# Patient Record
Sex: Male | Born: 1955 | Race: White | Hispanic: No | Marital: Married | Smoking: Never smoker
Health system: Southern US, Community
[De-identification: ages and names within clinical notes are randomized; demographics above are authoritative.]

## PROBLEM LIST (undated history)

## (undated) DIAGNOSIS — K4090 Unilateral inguinal hernia, without obstruction or gangrene, not specified as recurrent: Secondary | ICD-10-CM

## (undated) DIAGNOSIS — I499 Cardiac arrhythmia, unspecified: Secondary | ICD-10-CM

## (undated) DIAGNOSIS — J189 Pneumonia, unspecified organism: Secondary | ICD-10-CM

## (undated) DIAGNOSIS — N400 Enlarged prostate without lower urinary tract symptoms: Secondary | ICD-10-CM

## (undated) DIAGNOSIS — Z8719 Personal history of other diseases of the digestive system: Secondary | ICD-10-CM

## (undated) DIAGNOSIS — K219 Gastro-esophageal reflux disease without esophagitis: Secondary | ICD-10-CM

## (undated) DIAGNOSIS — E0500 Thyrotoxicosis with diffuse goiter without thyrotoxic crisis or storm: Secondary | ICD-10-CM

## (undated) DIAGNOSIS — I10 Essential (primary) hypertension: Secondary | ICD-10-CM

## (undated) DIAGNOSIS — K760 Fatty (change of) liver, not elsewhere classified: Secondary | ICD-10-CM

## (undated) DIAGNOSIS — R3911 Hesitancy of micturition: Secondary | ICD-10-CM

## (undated) DIAGNOSIS — S5401XA Injury of ulnar nerve at forearm level, right arm, initial encounter: Secondary | ICD-10-CM

## (undated) DIAGNOSIS — M179 Osteoarthritis of knee, unspecified: Secondary | ICD-10-CM

## (undated) DIAGNOSIS — S52009A Unspecified fracture of upper end of unspecified ulna, initial encounter for closed fracture: Secondary | ICD-10-CM

## (undated) DIAGNOSIS — M1710 Unilateral primary osteoarthritis, unspecified knee: Secondary | ICD-10-CM

## (undated) DIAGNOSIS — E039 Hypothyroidism, unspecified: Secondary | ICD-10-CM

## (undated) DIAGNOSIS — N401 Enlarged prostate with lower urinary tract symptoms: Secondary | ICD-10-CM

## (undated) DIAGNOSIS — Z9289 Personal history of other medical treatment: Secondary | ICD-10-CM

## (undated) DIAGNOSIS — K7460 Unspecified cirrhosis of liver: Secondary | ICD-10-CM

## (undated) DIAGNOSIS — I4891 Unspecified atrial fibrillation: Secondary | ICD-10-CM

## (undated) DIAGNOSIS — G709 Myoneural disorder, unspecified: Secondary | ICD-10-CM

## (undated) DIAGNOSIS — K759 Inflammatory liver disease, unspecified: Secondary | ICD-10-CM

## (undated) HISTORY — PX: ELBOW FRACTURE SURGERY: SHX616

---

## 1977-02-12 HISTORY — PX: COLOSTOMY: SHX63

## 1977-02-12 HISTORY — PX: COLOSTOMY CLOSURE: SHX1381

## 1979-02-13 HISTORY — PX: APPENDECTOMY: SHX54

## 1996-02-13 HISTORY — PX: HERNIA REPAIR: SHX51

## 2003-02-13 HISTORY — PX: HIATAL HERNIA REPAIR: SHX195

## 2015-04-08 ENCOUNTER — Other Ambulatory Visit: Payer: Self-pay | Admitting: Neurosurgery

## 2015-04-08 ENCOUNTER — Encounter (HOSPITAL_COMMUNITY): Payer: Self-pay | Admitting: *Deleted

## 2015-04-08 DIAGNOSIS — M4712 Other spondylosis with myelopathy, cervical region: Secondary | ICD-10-CM | POA: Diagnosis not present

## 2015-04-08 DIAGNOSIS — G959 Disease of spinal cord, unspecified: Secondary | ICD-10-CM | POA: Diagnosis not present

## 2015-04-08 DIAGNOSIS — M1990 Unspecified osteoarthritis, unspecified site: Secondary | ICD-10-CM | POA: Diagnosis not present

## 2015-04-08 DIAGNOSIS — Z23 Encounter for immunization: Secondary | ICD-10-CM | POA: Diagnosis not present

## 2015-04-08 DIAGNOSIS — M4806 Spinal stenosis, lumbar region: Secondary | ICD-10-CM | POA: Diagnosis not present

## 2015-04-08 DIAGNOSIS — M50021 Cervical disc disorder at C4-C5 level with myelopathy: Secondary | ICD-10-CM | POA: Diagnosis present

## 2015-04-08 DIAGNOSIS — M4802 Spinal stenosis, cervical region: Secondary | ICD-10-CM | POA: Diagnosis not present

## 2015-04-08 DIAGNOSIS — N400 Enlarged prostate without lower urinary tract symptoms: Secondary | ICD-10-CM | POA: Diagnosis not present

## 2015-04-08 NOTE — Progress Notes (Signed)
[name] does not have a PCP. Patient states he has seen a urologist for BPH and has 2 prescriptions for that.  Patient denies chest ain or shortness of breath.  [name] does not know the doseages of his medications, he will bring a list will he comes for surgery.  I iinstructed patient to stop Diclofenac at this time.

## 2015-04-11 ENCOUNTER — Ambulatory Visit (HOSPITAL_COMMUNITY): Payer: Managed Care, Other (non HMO)

## 2015-04-11 ENCOUNTER — Ambulatory Visit (HOSPITAL_COMMUNITY): Payer: Managed Care, Other (non HMO) | Admitting: Certified Registered Nurse Anesthetist

## 2015-04-11 ENCOUNTER — Encounter (HOSPITAL_COMMUNITY): Payer: Self-pay | Admitting: *Deleted

## 2015-04-11 ENCOUNTER — Encounter (HOSPITAL_COMMUNITY)
Admission: RE | Disposition: A | Discharge status: Home / Self Care | Payer: Self-pay | Source: Ambulatory Visit | Attending: Neurosurgery

## 2015-04-11 ENCOUNTER — Observation Stay (HOSPITAL_COMMUNITY)
Admission: RE | Admit: 2015-04-11 | Discharge: 2015-04-14 | Disposition: A | Discharge status: Home / Self Care | Payer: Managed Care, Other (non HMO) | Source: Ambulatory Visit | Attending: Neurosurgery | Admitting: Neurosurgery

## 2015-04-11 DIAGNOSIS — M50021 Cervical disc disorder at C4-C5 level with myelopathy: Secondary | ICD-10-CM | POA: Diagnosis not present

## 2015-04-11 DIAGNOSIS — M4806 Spinal stenosis, lumbar region: Secondary | ICD-10-CM | POA: Insufficient documentation

## 2015-04-11 DIAGNOSIS — G959 Disease of spinal cord, unspecified: Secondary | ICD-10-CM | POA: Insufficient documentation

## 2015-04-11 DIAGNOSIS — N400 Enlarged prostate without lower urinary tract symptoms: Secondary | ICD-10-CM | POA: Insufficient documentation

## 2015-04-11 DIAGNOSIS — M1990 Unspecified osteoarthritis, unspecified site: Secondary | ICD-10-CM | POA: Insufficient documentation

## 2015-04-11 DIAGNOSIS — M4712 Other spondylosis with myelopathy, cervical region: Secondary | ICD-10-CM | POA: Diagnosis present

## 2015-04-11 DIAGNOSIS — M542 Cervicalgia: Secondary | ICD-10-CM

## 2015-04-11 DIAGNOSIS — Z23 Encounter for immunization: Secondary | ICD-10-CM | POA: Insufficient documentation

## 2015-04-11 DIAGNOSIS — M4802 Spinal stenosis, cervical region: Secondary | ICD-10-CM | POA: Insufficient documentation

## 2015-04-11 HISTORY — DX: Unilateral primary osteoarthritis, unspecified knee: M17.10

## 2015-04-11 HISTORY — DX: Benign prostatic hyperplasia without lower urinary tract symptoms: N40.0

## 2015-04-11 HISTORY — DX: Pneumonia, unspecified organism: J18.9

## 2015-04-11 HISTORY — DX: Unilateral inguinal hernia, without obstruction or gangrene, not specified as recurrent: K40.90

## 2015-04-11 HISTORY — DX: Osteoarthritis of knee, unspecified: M17.9

## 2015-04-11 HISTORY — DX: Hesitancy of micturition: R39.11

## 2015-04-11 HISTORY — DX: Unspecified fracture of upper end of unspecified ulna, initial encounter for closed fracture: S52.009A

## 2015-04-11 HISTORY — DX: Benign prostatic hyperplasia with lower urinary tract symptoms: N40.1

## 2015-04-11 HISTORY — PX: ANTERIOR CERVICAL DECOMP/DISCECTOMY FUSION: SHX1161

## 2015-04-11 HISTORY — DX: Injury of ulnar nerve at forearm level, right arm, initial encounter: S54.01XA

## 2015-04-11 LAB — SURGICAL PCR SCREEN
MRSA, PCR: POSITIVE — AB
STAPHYLOCOCCUS AUREUS: POSITIVE — AB

## 2015-04-11 LAB — CBC
HCT: 41.6 % (ref 39.0–52.0)
HEMOGLOBIN: 14.2 g/dL (ref 13.0–17.0)
MCH: 30.1 pg (ref 26.0–34.0)
MCHC: 34.1 g/dL (ref 30.0–36.0)
MCV: 88.1 fL (ref 78.0–100.0)
PLATELETS: 218 10*3/uL (ref 150–400)
RBC: 4.72 MIL/uL (ref 4.22–5.81)
RDW: 12.8 % (ref 11.5–15.5)
WBC: 7.2 10*3/uL (ref 4.0–10.5)

## 2015-04-11 LAB — BASIC METABOLIC PANEL
ANION GAP: 11 (ref 5–15)
BUN: 12 mg/dL (ref 6–20)
CALCIUM: 9.4 mg/dL (ref 8.9–10.3)
CO2: 23 mmol/L (ref 22–32)
CREATININE: 0.68 mg/dL (ref 0.61–1.24)
Chloride: 107 mmol/L (ref 101–111)
Glucose, Bld: 94 mg/dL (ref 65–99)
Potassium: 4.1 mmol/L (ref 3.5–5.1)
SODIUM: 141 mmol/L (ref 135–145)

## 2015-04-11 SURGERY — ANTERIOR CERVICAL DECOMPRESSION/DISCECTOMY FUSION 2 LEVELS
Anesthesia: General

## 2015-04-11 MED ORDER — ONDANSETRON HCL 4 MG/2ML IJ SOLN
INTRAMUSCULAR | Status: DC | PRN
  Administered 2015-04-11: 4 mg via INTRAVENOUS

## 2015-04-11 MED ORDER — BACITRACIN ZINC 500 UNIT/GM EX OINT
TOPICAL_OINTMENT | CUTANEOUS | Status: DC | PRN
  Administered 2015-04-11: 1 via TOPICAL

## 2015-04-11 MED ORDER — ONDANSETRON HCL 4 MG/2ML IJ SOLN
INTRAMUSCULAR | Status: AC
  Filled 2015-04-11: qty 2

## 2015-04-11 MED ORDER — LACTATED RINGERS IV SOLN
INTRAVENOUS | Status: DC
  Administered 2015-04-11: 17:00:00 via INTRAVENOUS

## 2015-04-11 MED ORDER — GLYCOPYRROLATE 0.2 MG/ML IJ SOLN
INTRAMUSCULAR | Status: DC | PRN
  Administered 2015-04-11: 0.6 mg via INTRAVENOUS

## 2015-04-11 MED ORDER — ROCURONIUM BROMIDE 50 MG/5ML IV SOLN
INTRAVENOUS | Status: AC
  Filled 2015-04-11: qty 1

## 2015-04-11 MED ORDER — DEXAMETHASONE 4 MG PO TABS
4.0000 mg | ORAL_TABLET | Freq: Four times a day (QID) | ORAL | Status: AC
  Administered 2015-04-11 – 2015-04-12 (×2): 4 mg via ORAL
  Filled 2015-04-11 (×2): qty 1

## 2015-04-11 MED ORDER — LIDOCAINE HCL (CARDIAC) 20 MG/ML IV SOLN
INTRAVENOUS | Status: AC
  Filled 2015-04-11: qty 10

## 2015-04-11 MED ORDER — NEOSTIGMINE METHYLSULFATE 10 MG/10ML IV SOLN
INTRAVENOUS | Status: AC
  Filled 2015-04-11: qty 1

## 2015-04-11 MED ORDER — ALUM & MAG HYDROXIDE-SIMETH 200-200-20 MG/5ML PO SUSP
30.0000 mL | Freq: Four times a day (QID) | ORAL | Status: DC | PRN
  Administered 2015-04-12 (×2): 30 mL via ORAL
  Filled 2015-04-11 (×2): qty 30

## 2015-04-11 MED ORDER — TAMSULOSIN HCL 0.4 MG PO CAPS
0.4000 mg | ORAL_CAPSULE | Freq: Two times a day (BID) | ORAL | Status: DC
  Administered 2015-04-11 – 2015-04-14 (×6): 0.4 mg via ORAL
  Filled 2015-04-11 (×6): qty 1

## 2015-04-11 MED ORDER — MIDAZOLAM HCL 5 MG/5ML IJ SOLN: INTRAMUSCULAR | Status: DC | PRN

## 2015-04-11 MED ORDER — CYCLOBENZAPRINE HCL 10 MG PO TABS
10.0000 mg | ORAL_TABLET | Freq: Three times a day (TID) | ORAL | Status: DC | PRN
Start: 2015-04-11 — End: 2015-04-14
  Administered 2015-04-13: 10 mg via ORAL
  Filled 2015-04-11: qty 1

## 2015-04-11 MED ORDER — SODIUM CHLORIDE 0.9 % IR SOLN
Status: DC | PRN
  Administered 2015-04-11: 500 mL

## 2015-04-11 MED ORDER — LACTATED RINGERS IV SOLN
INTRAVENOUS | Status: DC
  Administered 2015-04-11 (×3): via INTRAVENOUS

## 2015-04-11 MED ORDER — EPHEDRINE SULFATE 50 MG/ML IJ SOLN
INTRAMUSCULAR | Status: DC | PRN
  Administered 2015-04-11 (×2): 10 mg via INTRAVENOUS

## 2015-04-11 MED ORDER — DOCUSATE SODIUM 100 MG PO CAPS
100.0000 mg | ORAL_CAPSULE | Freq: Two times a day (BID) | ORAL | Status: DC
  Administered 2015-04-11 – 2015-04-14 (×5): 100 mg via ORAL
  Filled 2015-04-11 (×6): qty 1

## 2015-04-11 MED ORDER — LIDOCAINE HCL (CARDIAC) 20 MG/ML IV SOLN
INTRAVENOUS | Status: AC
  Filled 2015-04-11: qty 5

## 2015-04-11 MED ORDER — FENTANYL CITRATE (PF) 100 MCG/2ML IJ SOLN
INTRAMUSCULAR | Status: AC
  Filled 2015-04-11: qty 2

## 2015-04-11 MED ORDER — SUCCINYLCHOLINE CHLORIDE 20 MG/ML IJ SOLN
INTRAMUSCULAR | Status: DC | PRN
  Administered 2015-04-11: 120 mg via INTRAVENOUS

## 2015-04-11 MED ORDER — BUPIVACAINE-EPINEPHRINE (PF) 0.5% -1:200000 IJ SOLN
INTRAMUSCULAR | Status: DC | PRN
  Administered 2015-04-11: 10 mL via PERINEURAL

## 2015-04-11 MED ORDER — DEXAMETHASONE SODIUM PHOSPHATE 4 MG/ML IJ SOLN
4.0000 mg | Freq: Four times a day (QID) | INTRAMUSCULAR | Status: AC
  Administered 2015-04-11: 4 mg via INTRAVENOUS
  Filled 2015-04-11: qty 1

## 2015-04-11 MED ORDER — ACETAMINOPHEN 325 MG PO TABS: 650.0000 mg | ORAL_TABLET | ORAL | Status: DC | PRN

## 2015-04-11 MED ORDER — ARTIFICIAL TEARS OP OINT
TOPICAL_OINTMENT | OPHTHALMIC | Status: DC | PRN
  Administered 2015-04-11: 1 via OPHTHALMIC

## 2015-04-11 MED ORDER — FENTANYL CITRATE (PF) 250 MCG/5ML IJ SOLN
INTRAMUSCULAR | Status: AC
  Filled 2015-04-11: qty 5

## 2015-04-11 MED ORDER — FINASTERIDE 5 MG PO TABS
5.0000 mg | ORAL_TABLET | Freq: Every day | ORAL | Status: DC
  Administered 2015-04-12 – 2015-04-14 (×3): 5 mg via ORAL
  Filled 2015-04-11 (×3): qty 1

## 2015-04-11 MED ORDER — MIDAZOLAM HCL 2 MG/2ML IJ SOLN
INTRAMUSCULAR | Status: AC
  Filled 2015-04-11: qty 2

## 2015-04-11 MED ORDER — INFLUENZA VAC SPLIT QUAD 0.5 ML IM SUSY
0.5000 mL | PREFILLED_SYRINGE | INTRAMUSCULAR | Status: AC
  Administered 2015-04-12: 0.5 mL via INTRAMUSCULAR
  Filled 2015-04-11: qty 0.5

## 2015-04-11 MED ORDER — MENTHOL 3 MG MT LOZG
1.0000 | LOZENGE | OROMUCOSAL | Status: DC | PRN
  Administered 2015-04-12: 3 mg via ORAL
  Filled 2015-04-11: qty 9

## 2015-04-11 MED ORDER — PHENOL 1.4 % MT LIQD: 1.0000 | OROMUCOSAL | Status: DC | PRN

## 2015-04-11 MED ORDER — LACTATED RINGERS IV SOLN: INTRAVENOUS | Status: DC

## 2015-04-11 MED ORDER — MORPHINE SULFATE (PF) 2 MG/ML IV SOLN
1.0000 mg | INTRAVENOUS | Status: DC | PRN
Start: 2015-04-11 — End: 2015-04-14

## 2015-04-11 MED ORDER — 0.9 % SODIUM CHLORIDE (POUR BTL) OPTIME
TOPICAL | Status: DC | PRN
  Administered 2015-04-11: 1000 mL

## 2015-04-11 MED ORDER — THROMBIN 5000 UNITS EX SOLR
CUTANEOUS | Status: DC | PRN
  Administered 2015-04-11 (×2): 5000 [IU] via TOPICAL

## 2015-04-11 MED ORDER — DIAZEPAM 5 MG PO TABS: 5.0000 mg | ORAL_TABLET | Freq: Four times a day (QID) | ORAL | Status: DC | PRN

## 2015-04-11 MED ORDER — PHENYLEPHRINE HCL 10 MG/ML IJ SOLN
INTRAMUSCULAR | Status: DC | PRN
  Administered 2015-04-11: 80 ug via INTRAVENOUS

## 2015-04-11 MED ORDER — ONDANSETRON HCL 4 MG/2ML IJ SOLN: 4.0000 mg | INTRAMUSCULAR | Status: DC | PRN

## 2015-04-11 MED ORDER — MUPIROCIN 2 % EX OINT
1.0000 "application " | TOPICAL_OINTMENT | Freq: Once | CUTANEOUS | Status: DC
  Filled 2015-04-11: qty 22

## 2015-04-11 MED ORDER — GLYCOPYRROLATE 0.2 MG/ML IJ SOLN
INTRAMUSCULAR | Status: AC
  Filled 2015-04-11: qty 3

## 2015-04-11 MED ORDER — MIDAZOLAM HCL 5 MG/5ML IJ SOLN
INTRAMUSCULAR | Status: DC | PRN
  Administered 2015-04-11: 2 mg via INTRAVENOUS

## 2015-04-11 MED ORDER — BISACODYL 10 MG RE SUPP: 10.0000 mg | Freq: Every day | RECTAL | Status: DC | PRN

## 2015-04-11 MED ORDER — NEOSTIGMINE METHYLSULFATE 10 MG/10ML IV SOLN
INTRAVENOUS | Status: DC | PRN
  Administered 2015-04-11: 4 mg via INTRAVENOUS

## 2015-04-11 MED ORDER — ACETAMINOPHEN 650 MG RE SUPP: 650.0000 mg | RECTAL | Status: DC | PRN

## 2015-04-11 MED ORDER — HEMOSTATIC AGENTS (NO CHARGE) OPTIME
TOPICAL | Status: DC | PRN
  Administered 2015-04-11: 1 via TOPICAL

## 2015-04-11 MED ORDER — PANTOPRAZOLE SODIUM 40 MG IV SOLR
40.0000 mg | Freq: Every day | INTRAVENOUS | Status: DC
  Administered 2015-04-11: 40 mg via INTRAVENOUS

## 2015-04-11 MED ORDER — OXYCODONE-ACETAMINOPHEN 5-325 MG PO TABS
1.0000 | ORAL_TABLET | ORAL | Status: DC | PRN
  Administered 2015-04-13: 1 via ORAL
  Administered 2015-04-13 – 2015-04-14 (×2): 2 via ORAL
  Filled 2015-04-11: qty 2
  Filled 2015-04-11: qty 1
  Filled 2015-04-11: qty 2

## 2015-04-11 MED ORDER — DEXTROSE 5 % IV SOLN
3.0000 g | INTRAVENOUS | Status: AC
  Administered 2015-04-11: 3 g via INTRAVENOUS
  Filled 2015-04-11: qty 3000

## 2015-04-11 MED ORDER — ADULT MULTIVITAMIN W/MINERALS CH
ORAL_TABLET | Freq: Every day | ORAL | Status: DC
  Administered 2015-04-12 – 2015-04-14 (×3): 1 via ORAL
  Filled 2015-04-11 (×3): qty 1

## 2015-04-11 MED ORDER — LIDOCAINE HCL (CARDIAC) 20 MG/ML IV SOLN
INTRAVENOUS | Status: DC | PRN
  Administered 2015-04-11: 50 mg via INTRAVENOUS
  Administered 2015-04-11: 100 mg via INTRAVENOUS
  Administered 2015-04-11: 50 mg via INTRAVENOUS

## 2015-04-11 MED ORDER — MEPERIDINE HCL 25 MG/ML IJ SOLN: 6.2500 mg | INTRAMUSCULAR | Status: DC | PRN

## 2015-04-11 MED ORDER — FENTANYL CITRATE (PF) 100 MCG/2ML IJ SOLN
25.0000 ug | INTRAMUSCULAR | Status: DC | PRN
  Administered 2015-04-11 (×2): 50 ug via INTRAVENOUS

## 2015-04-11 MED ORDER — PROMETHAZINE HCL 25 MG/ML IJ SOLN: 6.2500 mg | INTRAMUSCULAR | Status: DC | PRN

## 2015-04-11 MED ORDER — DEXAMETHASONE SODIUM PHOSPHATE 10 MG/ML IJ SOLN
INTRAMUSCULAR | Status: DC | PRN
  Administered 2015-04-11: 10 mg via INTRAVENOUS

## 2015-04-11 MED ORDER — CEFAZOLIN SODIUM-DEXTROSE 2-3 GM-% IV SOLR
2.0000 g | Freq: Three times a day (TID) | INTRAVENOUS | Status: AC
Start: 2015-04-11 — End: 2015-04-12
  Administered 2015-04-11 – 2015-04-12 (×2): 2 g via INTRAVENOUS
  Filled 2015-04-11 (×2): qty 50

## 2015-04-11 MED ORDER — PROPOFOL 10 MG/ML IV BOLUS
INTRAVENOUS | Status: AC
Start: 2015-04-11 — End: 2015-04-11
  Filled 2015-04-11: qty 20

## 2015-04-11 MED ORDER — PROPOFOL 10 MG/ML IV BOLUS
INTRAVENOUS | Status: DC | PRN
  Administered 2015-04-11: 200 mg via INTRAVENOUS
  Administered 2015-04-11: 30 mg via INTRAVENOUS
  Administered 2015-04-11 (×2): 20 mg via INTRAVENOUS

## 2015-04-11 MED ORDER — LIDOCAINE HCL (CARDIAC) 20 MG/ML IV SOLN: INTRAVENOUS | Status: DC | PRN

## 2015-04-11 MED ORDER — FENTANYL CITRATE (PF) 100 MCG/2ML IJ SOLN
INTRAMUSCULAR | Status: DC | PRN
  Administered 2015-04-11 (×5): 50 ug via INTRAVENOUS

## 2015-04-11 MED ORDER — HYDROCODONE-ACETAMINOPHEN 5-325 MG PO TABS
1.0000 | ORAL_TABLET | ORAL | Status: DC | PRN
Start: 2015-04-11 — End: 2015-04-14

## 2015-04-11 MED ORDER — ARTIFICIAL TEARS OP OINT
TOPICAL_OINTMENT | OPHTHALMIC | Status: AC
  Filled 2015-04-11: qty 3.5

## 2015-04-11 MED ORDER — ROCURONIUM BROMIDE 100 MG/10ML IV SOLN
INTRAVENOUS | Status: DC | PRN
  Administered 2015-04-11: 50 mg via INTRAVENOUS
  Administered 2015-04-11 (×2): 20 mg via INTRAVENOUS

## 2015-04-11 SURGICAL SUPPLY — 55 items
BAG DECANTER FOR FLEXI CONT (MISCELLANEOUS) ×2 IMPLANT
BENZOIN TINCTURE PRP APPL 2/3 (GAUZE/BANDAGES/DRESSINGS) ×2 IMPLANT
BIT DRILL NEURO 2X3.1 SFT TUCH (MISCELLANEOUS) ×1 IMPLANT
BLADE SURG 15 STRL LF DISP TIS (BLADE) ×1 IMPLANT
BLADE SURG 15 STRL SS (BLADE) ×1
BLADE ULTRA TIP 2M (BLADE) ×2 IMPLANT
BRUSH SCRUB EZ PLAIN DRY (MISCELLANEOUS) ×2 IMPLANT
BUR BARREL STRAIGHT FLUTE 4.0 (BURR) ×2 IMPLANT
BUR MATCHSTICK NEURO 3.0 LAGG (BURR) ×2 IMPLANT
CANISTER SUCT 3000ML PPV (MISCELLANEOUS) ×2 IMPLANT
COVER MAYO STAND STRL (DRAPES) ×2 IMPLANT
DRAPE LAPAROTOMY 100X72 PEDS (DRAPES) ×2 IMPLANT
DRAPE MICROSCOPE LEICA (MISCELLANEOUS) IMPLANT
DRAPE POUCH INSTRU U-SHP 10X18 (DRAPES) ×2 IMPLANT
DRAPE PROXIMA HALF (DRAPES) ×2 IMPLANT
DRAPE SURG 17X23 STRL (DRAPES) ×4 IMPLANT
DRILL NEURO 2X3.1 SOFT TOUCH (MISCELLANEOUS) ×2
ELECT REM PT RETURN 9FT ADLT (ELECTROSURGICAL) ×2
ELECTRODE REM PT RTRN 9FT ADLT (ELECTROSURGICAL) ×1 IMPLANT
GAUZE SPONGE 4X4 12PLY STRL (GAUZE/BANDAGES/DRESSINGS) ×2 IMPLANT
GAUZE SPONGE 4X4 16PLY XRAY LF (GAUZE/BANDAGES/DRESSINGS) IMPLANT
GLOVE BIO SURGEON STRL SZ8 (GLOVE) ×2 IMPLANT
GLOVE BIO SURGEON STRL SZ8.5 (GLOVE) ×2 IMPLANT
GLOVE EXAM NITRILE LRG STRL (GLOVE) IMPLANT
GLOVE EXAM NITRILE MD LF STRL (GLOVE) IMPLANT
GLOVE EXAM NITRILE XL STR (GLOVE) IMPLANT
GLOVE EXAM NITRILE XS STR PU (GLOVE) IMPLANT
GOWN STRL REUS W/ TWL LRG LVL3 (GOWN DISPOSABLE) IMPLANT
GOWN STRL REUS W/ TWL XL LVL3 (GOWN DISPOSABLE) IMPLANT
GOWN STRL REUS W/TWL LRG LVL3 (GOWN DISPOSABLE)
GOWN STRL REUS W/TWL XL LVL3 (GOWN DISPOSABLE)
KIT BASIN OR (CUSTOM PROCEDURE TRAY) ×2 IMPLANT
KIT ROOM TURNOVER OR (KITS) ×2 IMPLANT
MARKER SKIN DUAL TIP RULER LAB (MISCELLANEOUS) ×2 IMPLANT
NEEDLE HYPO 22GX1.5 SAFETY (NEEDLE) ×2 IMPLANT
NEEDLE SPNL 18GX3.5 QUINCKE PK (NEEDLE) ×2 IMPLANT
NS IRRIG 1000ML POUR BTL (IV SOLUTION) ×2 IMPLANT
PACK LAMINECTOMY NEURO (CUSTOM PROCEDURE TRAY) ×2 IMPLANT
PEEK VISTA 14X14X7MM (Peek) ×2 IMPLANT
PIN DISTRACTION 14MM (PIN) ×4 IMPLANT
PLATE ANT CERV XTEND 2 LV 28 (Plate) ×2 IMPLANT
PUTTY KINEX BIOACTIVE 5CC (Bone Implant) ×2 IMPLANT
RUBBERBAND STERILE (MISCELLANEOUS) IMPLANT
SCREW XTD VAR 4.2 SELF TAP (Screw) ×12 IMPLANT
SPONGE INTESTINAL PEANUT (DISPOSABLE) ×4 IMPLANT
SPONGE SURGIFOAM ABS GEL SZ50 (HEMOSTASIS) ×2 IMPLANT
STRIP CLOSURE SKIN 1/2X4 (GAUZE/BANDAGES/DRESSINGS) ×2 IMPLANT
SUT VIC AB 0 CT1 27 (SUTURE) ×1
SUT VIC AB 0 CT1 27XBRD ANTBC (SUTURE) ×1 IMPLANT
SUT VIC AB 3-0 SH 8-18 (SUTURE) ×2 IMPLANT
TAPE CLOTH SURG 4X10 WHT LF (GAUZE/BANDAGES/DRESSINGS) ×2 IMPLANT
TOWEL OR 17X24 6PK STRL BLUE (TOWEL DISPOSABLE) ×2 IMPLANT
TOWEL OR 17X26 10 PK STRL BLUE (TOWEL DISPOSABLE) ×2 IMPLANT
VISTA S 14X14X7 (Spacer) ×2 IMPLANT
WATER STERILE IRR 1000ML POUR (IV SOLUTION) ×2 IMPLANT

## 2015-04-11 NOTE — Anesthesia Procedure Notes (Signed)
Procedure Name: Intubation Date/Time: 04/11/2015 10:40 AM Performed by: Scheryl Darter Pre-anesthesia Checklist: Patient identified, Emergency Drugs available, Suction available, Patient being monitored and Timeout performed Patient Re-evaluated:Patient Re-evaluated prior to inductionOxygen Delivery Method: Circle system utilized Preoxygenation: Pre-oxygenation with 100% oxygen Intubation Type: IV induction Ventilation: Mask ventilation without difficulty and Oral airway inserted - appropriate to patient size Laryngoscope Size: Glidescope Grade View: Grade II Tube size: 7.5 mm Number of attempts: 1 Airway Equipment and Method: Stylet Placement Confirmation: ETT inserted through vocal cords under direct vision,  positive ETCO2 and breath sounds checked- equal and bilateral Secured at: 23 cm Tube secured with: Tape Dental Injury: Teeth and Oropharynx as per pre-operative assessment  Difficulty Due To: Difficulty was anticipated Comments: Head and neck held by Dr Arnoldo Morale during induction and intubation/no movement

## 2015-04-11 NOTE — Anesthesia Postprocedure Evaluation (Signed)
Anesthesia Post Note  Patient: Darren Harrison  Procedure(s) Performed: Procedure(s) (LRB): Cervical four - five Cervical five-six anterior cervical decompression with fusion interbody prosthesis plating and bonegraft (N/A)  Patient location during evaluation: PACU Anesthesia Type: General Level of consciousness: awake and alert Pain management: pain level controlled Vital Signs Assessment: post-procedure vital signs reviewed and stable Respiratory status: spontaneous breathing, nonlabored ventilation and respiratory function stable Cardiovascular status: blood pressure returned to baseline and stable Postop Assessment: no signs of nausea or vomiting Anesthetic complications: no    Last Vitals:  Filed Vitals:   04/11/15 1445 04/11/15 1500  BP: 131/90 132/82  Pulse: 89 74  Temp:    Resp: 18 18    Last Pain:  Filed Vitals:   04/11/15 1514  PainSc: Asleep                 Zackari Ruane A

## 2015-04-11 NOTE — Op Note (Signed)
Brief history: The patient is a 60 year old white male who has presented with a progressive difficulty with ambulation. 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 He was worked up with a cervical MRI which demonstrated significant narrowing at C4-5 and C5-6. I discussed the various treatment options with the patient including surgery. He has weighed the risks, benefits, and alternative surgery and decided proceed with C4-5 and C5-6 anterior cervical discectomy, fusion, and plating.  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 Earle Gell  Asst.: Dr. Granville Lewis  Anesthesia: Gen. endotracheal  Estimated blood loss: 100 mL  Drains: None  Complications: None  Description of procedure: The patient was brought to the operating room by the anesthesia team. General endotracheal anesthesia was induced. A roll was placed under the patient's shoulders to keep the neck in the neutral position. The patient's anterior cervical region was then prepared with Betadine scrub and Betadine solution. Sterile drapes were applied.  The area to be incised was then injected with Marcaine with epinephrine solution. I then used a scalpel to make a transverse incision in the patient's left anterior neck. I used the Metzenbaum scissors to divide the platysmal muscle and then to dissect medial to the sternocleidomastoid muscle, jugular vein, and carotid artery. I carefully dissected down towards the anterior cervical spine identifying the esophagus and retracting it medially. Then using Kitner swabs  to clear soft tissue from the anterior cervical spine. We then inserted a bent spinal needle into the upper exposed intervertebral disc space. We then obtained intraoperative radiographs confirm our location.  I then used electrocautery to detach the medial border of the longus colli muscle bilaterally from the C4-5 and C5-6 intervertebral disc spaces. I then inserted the Caspar self-retaining retractor underneath the longus colli muscle bilaterally to provide exposure.  We then incised the intervertebral disc at C4-5. We then performed a partial intervertebral discectomy with a pituitary forceps and the Karlin curettes. I then inserted distraction screws into the vertebral bodies at C4 and C5. We then distracted the interspace. We then used the high-speed drill to decorticate the vertebral endplates at [id number], to drill away the remainder of the intervertebral disc, to drill away some posterior spondylosis, and to thin out the posterior longitudinal ligament. I then incised ligament with the arachnoid knife. We then removed the ligament with a Kerrison punches undercutting the vertebral endplates and decompressing the thecal sac. We then performed foraminotomies about the bilateral C5 nerve roots. This completed the decompression at this level.  We then repeated this procedure and analogous fashion at C5-6 decompressing the thecal sac and the bilateral C6 nerve roots.  We now turned our to attention to the interbody fusion. We used the trial spacers to determine the appropriate size for the interbody prosthesis. We then pre-filled prosthesis with a combination of local morcellized autograft bone that we obtained during decompression as well as Kinnex bone graft extender. We then inserted the prosthesis into the distracted interspace at C4-5 and C5-6. We then removed the distraction screws. There was a good snug fit of the prosthesis in the interspace.  Having completed the fusion we now turned attention to  the anterior spinal instrumentation. We  used the high-speed drill to drill away some anterior spondylosis at the disc spaces so that the plate lay down flat. We selected the appropriate length titanium anterior cervical plate. We laid it along the anterior aspect of the vertebral bodies from C4-C6. We then drilled 14 mm holes at C4, C5 and C6. We then secured the plate to the vertebral bodies by placing two 14 mm self-tapping screws at C4, C5 and C6. We then obtained intraoperative radiograph. The demonstrating good position of the instrumentation. We therefore secured the screws the plate the locking each cam. This completed the instrumentation.  We then obtained hemostasis using bipolar electrocautery. We irrigated the wound out with bacitracin solution. We then removed the retractor. We inspected the esophagus for any damage. There was none apparent. We then reapproximated patient's platysmal muscle with interrupted 3-0 Vicryl suture. We then reapproximated the subcutaneous tissue with interrupted 3-0 Vicryl suture. The skin was reapproximated with Steri-Strips and benzoin. The wound was then covered with bacitracin ointment. A sterile dressing was applied. The drapes were removed. Patient was subsequently extubated by the anesthesia team and transported to the post anesthesia care unit in stable condition. All sponge instrument and needle counts were reportedly correct at the end of this case.

## 2015-04-11 NOTE — Anesthesia Preprocedure Evaluation (Signed)
Anesthesia Evaluation  Patient identified by MRN, date of birth, ID band Patient awake    Reviewed: Allergy & Precautions, NPO status , Patient's Chart, lab work & pertinent test results  Airway Mallampati: II  TM Distance: >3 FB Neck ROM: Full    Dental no notable dental hx.    Pulmonary neg pulmonary ROS,    Pulmonary exam normal breath sounds clear to auscultation       Cardiovascular negative cardio ROS Normal cardiovascular exam Rhythm:Regular Rate:Normal     Neuro/Psych negative neurological ROS  negative psych ROS   GI/Hepatic negative GI ROS, Neg liver ROS,   Endo/Other  negative endocrine ROS  Renal/GU negative Renal ROS  negative genitourinary   Musculoskeletal  (+) Arthritis ,   Abdominal   Peds negative pediatric ROS (+)  Hematology negative hematology ROS (+)   Anesthesia Other Findings   Reproductive/Obstetrics negative OB ROS                             Anesthesia Physical Anesthesia Plan  ASA: II  Anesthesia Plan: General   Post-op Pain Management:    Induction: Intravenous  Airway Management Planned: Oral ETT  Additional Equipment:   Intra-op Plan:   Post-operative Plan: Extubation in OR  Informed Consent: I have reviewed the patients History and Physical, chart, labs and discussed the procedure including the risks, benefits and alternatives for the proposed anesthesia with the patient or authorized representative who has indicated his/her understanding and acceptance.   Dental advisory given  Plan Discussed with: CRNA  Anesthesia Plan Comments:         Anesthesia Quick Evaluation

## 2015-04-11 NOTE — Transfer of Care (Signed)
Immediate Anesthesia Transfer of Care Note  Patient: Darren Harrison  Procedure(s) Performed: Procedure(s) with comments: Cervical four - five Cervical five-six anterior cervical decompression with fusion interbody prosthesis plating and bonegraft (N/A) - C45 C56 anterior cervical decompression with fusion interbody prosthesis plating and bonegraft  Patient Location: PACU  Anesthesia Type:General  Level of Consciousness: awake, alert , oriented and sedated  Airway & Oxygen Therapy: Patient Spontanous Breathing and Patient connected to face mask oxygen  Post-op Assessment: Report given to RN, Post -op Vital signs reviewed and stable and Patient moving all extremities  Post vital signs: Reviewed and stable  Last Vitals:  Filed Vitals:   04/11/15 0920 04/11/15 1307  BP: 136/82   Pulse: 72   Temp: 36.4 C 36.4 C  Resp: 16     Complications: No apparent anesthesia complications

## 2015-04-11 NOTE — Progress Notes (Signed)
Pt to main pacu to hold
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
## 2015-04-11 NOTE — Progress Notes (Signed)
Patient ID: Darren Harrison, male   DOB: June 09, 1955, 60 y.o.   MRN: [id number] Subjective:  The patient is alert and pleasant. He is in no apparent distress.  Objective: Vital signs in last 24 hours: Temp:  [97.5 F (36.4 C)-97.6 F (36.4 C)] 97.6 F (36.4 C) (02/27 1307) Pulse Rate:  [48-86] 86 (02/27 1325) Resp:  [16-27] 23 (02/27 1325) BP: (136-149)/(82-131) 149/97 mmHg (02/27 1325) SpO2:  [92 %-98 %] 92 % (02/27 1325) Weight:  [120.203 kg (265 lb)-122.471 kg (270 lb)] 120.203 kg (265 lb) (02/27 0920)  Intake/Output from previous day:   Intake/Output this shift: Total I/O In: 1350 [I.V.:1300; IV Piggyback:50] Out: 25 [Blood:25]  Physical exam the patient is alert and pleasant. He is moving all 4 extremities.  The patient's dressing is clean and dry. There is no hematoma or shift.  Lab Results:  Recent Labs  04/11/15 0841  WBC 7.2  HGB 14.2  HCT 41.6  PLT 218   BMET  Recent Labs  04/11/15 0841  NA 141  K 4.1  CL 107  CO2 23  GLUCOSE 94  BUN 12  CREATININE 0.68  CALCIUM 9.4    Studies/Results: Dg Cervical Spine 2-3 Views  04/11/2015  CLINICAL DATA:  ACDF C4-C6. EXAM: CERVICAL SPINE - 2-3 VIEW COMPARISON:  03/22/2015 cervical spine MRI. FINDINGS: Portable cross-table lateral intraoperative radiograph demonstrates anterior approach surgical marking device terminating in the anterior C3-4 disc space. Subsequent portable cross-table lateral intraoperative radiograph demonstrates anterior surgical plate with interlocking vertebral body screws from C4-C6. Surgical sponges overlie the prevertebral soft tissues at the C3 level and overlie the anterior neck at the C5 level. Oral route tube enters the upper airway. IMPRESSION: Anterior approach surgical marking device position as described, and subsequent postsurgical changes from C4-C6 ACDF. Electronically Signed   By: Ilona Sorrel M.D.   On: 04/11/2015 13:02    Assessment/Plan: The patient is doing well. I spoke with  his wife and children.      Akaya Proffit D 04/11/2015, 1:29 PM

## 2015-04-12 ENCOUNTER — Encounter (HOSPITAL_COMMUNITY): Payer: Self-pay | Admitting: Neurosurgery

## 2015-04-12 DIAGNOSIS — G8250 Quadriplegia, unspecified: Secondary | ICD-10-CM | POA: Diagnosis not present

## 2015-04-12 DIAGNOSIS — M50021 Cervical disc disorder at C4-C5 level with myelopathy: Secondary | ICD-10-CM | POA: Diagnosis not present

## 2015-04-12 DIAGNOSIS — M4712 Other spondylosis with myelopathy, cervical region: Secondary | ICD-10-CM | POA: Diagnosis not present

## 2015-04-12 MED ORDER — CHLORHEXIDINE GLUCONATE CLOTH 2 % EX PADS
6.0000 | MEDICATED_PAD | Freq: Every day | CUTANEOUS | Status: DC
  Administered 2015-04-12 – 2015-04-14 (×3): 6 via TOPICAL

## 2015-04-12 MED ORDER — PANTOPRAZOLE SODIUM 40 MG PO TBEC
40.0000 mg | DELAYED_RELEASE_TABLET | Freq: Every day | ORAL | Status: DC
  Administered 2015-04-12 – 2015-04-13 (×2): 40 mg via ORAL
  Filled 2015-04-12 (×2): qty 1

## 2015-04-12 MED ORDER — MUPIROCIN 2 % EX OINT
1.0000 "application " | TOPICAL_OINTMENT | Freq: Two times a day (BID) | CUTANEOUS | Status: DC
  Administered 2015-04-12 – 2015-04-14 (×5): 1 via NASAL
  Filled 2015-04-12 (×2): qty 22

## 2015-04-12 NOTE — Progress Notes (Signed)
Patient ID: Darren Harrison, male   DOB: October 07, 1955, 60 y.o.   MRN: [id number] Subjective:  The patient is alert and pleasant. He looks and feels much better than prior to surgery.  Objective: Vital signs in last 24 hours: Temp:  [97 F (36.1 C)-98.9 F (37.2 C)] 98.4 F (36.9 C) (02/28 0257) Pulse Rate:  [48-99] 63 (02/28 0257) Resp:  [16-27] 21 (02/28 0257) BP: (126-150)/(65-131) 126/95 mmHg (02/28 0257) SpO2:  [76 %-100 %] 100 % (02/28 0257) Weight:  [120.203 kg (265 lb)-125.5 kg (276 lb 10.8 oz)] 125.5 kg (276 lb 10.8 oz) (02/27 1600)  Intake/Output from previous day: 02/27 0701 - 02/28 0700 In: 2250 [I.V.:2200; IV Piggyback:50] Out: [id number] [Urine:2450; Blood:25] Intake/Output this shift:    Physical exam the patient is alert and pleasant. He is moving all 4 extremities well. His dressing is clean and dry. There is no hematoma or shift.  Lab Results:  Recent Labs  04/11/15 0841  WBC 7.2  HGB 14.2  HCT 41.6  PLT 218   BMET  Recent Labs  04/11/15 0841  NA 141  K 4.1  CL 107  CO2 23  GLUCOSE 94  BUN 12  CREATININE 0.68  CALCIUM 9.4    Studies/Results: Dg Cervical Spine 2-3 Views  04/11/2015  CLINICAL DATA:  ACDF C4-C6. EXAM: CERVICAL SPINE - 2-3 VIEW COMPARISON:  03/22/2015 cervical spine MRI. FINDINGS: Portable cross-table lateral intraoperative radiograph demonstrates anterior approach surgical marking device terminating in the anterior C3-4 disc space. Subsequent portable cross-table lateral intraoperative radiograph demonstrates anterior surgical plate with interlocking vertebral body screws from C4-C6. Surgical sponges overlie the prevertebral soft tissues at the C3 level and overlie the anterior neck at the C5 level. Oral route tube enters the upper airway. IMPRESSION: Anterior approach surgical marking device position as described, and subsequent postsurgical changes from C4-C6 ACDF. Electronically Signed   By: Ilona Sorrel M.D.   On: 04/11/2015 13:02     Assessment/Plan: Postoperative day #1: We will mobilize the patient with PT and OT. He may need rehabilitation since he was having difficulty with ambulation prior to surgery.      Amad Mau D 04/12/2015, 7:02 AM
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
## 2015-04-12 NOTE — Evaluation (Signed)
Occupational Therapy Evaluation Patient Details Name: Darren Harrison MRN: [id number] DOB: 10/31/1955 Today's Date: 04/12/2015    History of Present Illness Pt admitted for C4-5, C5-6 anterior cervical discectomy, fusion, and plating due to several months progressive weakness and difficulty with ambulation.   Pt also found to have significant lumbar spinal stenosis.   PMH:   PNA,    Clinical Impression   Pt admitted with above. He demonstrates the below listed deficits and will benefit from continued OT to maximize safety and independence with BADLs.  Pt presents to OT with bil. UE weakness, LE weakness, impaired balance, impaired sensation bil. Hands.  He requires mod - max A for ADLs at this time.  He is VERY motivated to regain independence. He was working full time 2-3 weeks ago and is eager to return to work.   Feel he would be an EXCELLENT candidate for CIR to allow him to regain independence with ADLs and return home with wife at supervision - min A level.        Follow Up Recommendations  CIR;Supervision/Assistance - 24 hour    Equipment Recommendations  3 in 1 bedside comode    Recommendations for Other Services Rehab consult     Precautions / Restrictions Precautions Precautions: Cervical;Fall Precaution Comments: Reviewed cervical precautions w/ pt Required Braces or Orthoses: Cervical Brace Cervical Brace: Hard collar;At all times Restrictions Weight Bearing Restrictions: No      Mobility Bed Mobility Overal bed mobility: Needs Assistance Bed Mobility: Supine to Sit     Supine to sit: Min guard     General bed mobility comments: Pt long sitting in bed upon PT arrival.  Pt requires increased time and cues to avoid twisting his neck when turning to sit EOB.  Transfers Overall transfer level: Needs assistance Equipment used: Rolling walker (2 wheeled) Transfers: Sit to/from Stand Sit to Stand: Min assist;+2 physical assistance Stand pivot transfers: Min  assist;+2 physical assistance       General transfer comment: Assist to boost up to standing while keeping RW steady.  Cues for hand placement.      Balance Overall balance assessment: Needs assistance Sitting-balance support: Feet supported Sitting balance-Leahy Scale: Fair     Standing balance support: Bilateral upper extremity supported Standing balance-Leahy Scale: Poor                              ADL Overall ADL's : Needs assistance/impaired Eating/Feeding: Sitting;Minimal assistance Eating/Feeding Details (indicate cue type and reason): Pt dropping items frequently.   Demonstrates difficulty targeting mouth, and difficulty cutting items.  He reports his performance is better today than it was yesterday.  Pt was provided with built up handled curved utensils, and was able to self feed with set up and less droppage   Grooming: Wash/dry hands;Wash/dry face;Oral care;Moderate assistance Grooming Details (indicate cue type and reason): washes face with min A, hands with set up, brushing teeth with min A and hair with mod A  Upper Body Bathing: Moderate assistance;Sitting   Lower Body Bathing: Maximal assistance;Sit to/from stand   Upper Body Dressing : Maximal assistance;Sitting   Lower Body Dressing: Total assistance;Sit to/from stand   Toilet Transfer: Minimal assistance;+2 for physical assistance;Stand-pivot;BSC   Toileting- Clothing Manipulation and Hygiene: Maximal assistance;Sit to/from stand       Functional mobility during ADLs: Minimal assistance;+2 for physical assistance;Moderate assistance General ADL Comments: Pt is unsteady on his feet, requires assist for balance.  He is highly motivated, but requires assist due to weakness and impaired balance      Vision     Perception     Praxis      Pertinent Vitals/Pain Pain Assessment: No/denies pain     Hand Dominance Right   Extremity/Trunk Assessment Upper Extremity Assessment Upper  Extremity Assessment: Defer to OT evaluation RUE Deficits / Details: Rt UE grossly 2/5 shoulder; 3+/5 elbow and hand   RUE Sensation: decreased light touch RUE Coordination: decreased gross motor;decreased fine motor LUE Deficits / Details: Pt with h/o Lt shoulder injury.  He tends to abduct Lt shoulder when attempting to elevate shoulder - he states this is his normal movement pattern.   elbow distally 3+/5 LUE Sensation: decreased light touch LUE Coordination: decreased fine motor;decreased gross motor   Lower Extremity Assessment Lower Extremity Assessment: RLE deficits/detail;LLE deficits/detail RLE Deficits / Details: 3/5 hip flexion and DF; 4/5 knee flexion and extension RLE Sensation: decreased light touch RLE Coordination: decreased gross motor LLE Deficits / Details: hip flexion 4/5 LLE Sensation: decreased light touch LLE Coordination: decreased gross motor       Communication Communication Communication: No difficulties   Cognition Arousal/Alertness: Awake/alert Behavior During Therapy: WFL for tasks assessed/performed Overall Cognitive Status: Within Functional Limits for tasks assessed                     General Comments       Exercises       Shoulder Instructions      Home Living Family/patient expects to be discharged to:: Private residence Living Arrangements: Spouse/significant other Available Help at Discharge: Family;Available PRN/intermittently Type of Home: House Home Access: Level entry     Home Layout: Two level;Able to live on main level with bedroom/bathroom Alternate Level Stairs-Number of Steps: full flight    Bathroom Shower/Tub: Occupational psychologist: Standard Bathroom Accessibility: Yes How Accessible: Accessible via walker Home Equipment: Walker - standard;Shower seat;Grab bars - toilet;Wheelchair - Education officer, museum Comments: wife works during the day, comes home at lunch and is only 15 mins  away if pt needs assist       Prior Functioning/Environment Level of Independence: Needs assistance  Gait / Transfers Assistance Needed: had only been performing stand pivot transfers to w/c/chair for 2-3 wks PTA, with wife assisting  ADL's / Homemaking Assistance Needed: Pt reports he had stopped working, was unable to drive 2 wks PTA, and was requiring assist with bathing, dressing, toileting as well as with functional transfers  Communication / Swallowing Assistance Needed: Pt works Information systems manager  for [employer]       OT Diagnosis: Generalized weakness;Acute pain;Paresis   OT Problem List: Decreased strength;Decreased range of motion;Impaired balance (sitting and/or standing);Decreased coordination;Decreased knowledge of use of DME or AE;Decreased knowledge of precautions;Impaired UE functional use;Pain   OT Treatment/Interventions: Self-care/ADL training;Therapeutic exercise;Neuromuscular education;DME and/or AE instruction;Therapeutic activities;Patient/family education;Balance training    OT Goals(Current goals can be found in the care plan section) Acute Rehab OT Goals Patient Stated Goal: To go back to work ASAP, and to walk his daughters down the aisle (getting married in April and July) OT Goal Formulation: With patient Time For Goal Achievement: 04/26/15 Potential to Achieve Goals: Good ADL Goals Pt Will Perform Eating: with modified independence;with adaptive utensils Pt Will Perform Grooming: with min guard assist;standing Pt Will Perform Upper Body Bathing: with supervision;sitting Pt Will Perform Lower Body Bathing: with min assist;sit to/from stand;with adaptive equipment  Pt Will Perform Upper Body Dressing: with min assist;sitting Pt Will Perform Lower Body Dressing: with min assist;with adaptive equipment;sit to/from stand Pt Will Transfer to Toilet: with min guard assist;ambulating;regular height toilet;bedside commode;grab bars Pt Will Perform Toileting -  Clothing Manipulation and hygiene: with min guard assist;sit to/from stand;with adaptive equipment Pt/caregiver will Perform Home Exercise Program: Increased strength;Increased ROM;Right Upper extremity;Left upper extremity;Independently;With written HEP provided;With theraputty;With theraband  OT Frequency: Min 2X/week   Barriers to D/C:            Co-evaluation              End of Session Equipment Utilized During Treatment: Rolling walker;Cervical collar;Gait belt Nurse Communication: Mobility status  Activity Tolerance: Patient tolerated treatment well Patient left: in chair;with call bell/phone within reach   Time: 1358-1415 OT Time Calculation (min): 17 min Charges:  OT General Charges $OT Visit: 1 Procedure OT Evaluation $OT Eval Moderate Complexity: 1 Procedure G-Codes:    Darren Harrison M Apr 29, 2015, 2:51 PM

## 2015-04-12 NOTE — Progress Notes (Signed)
Pt transferred to 5C17 per MD order, report called to receiving nurse ans all questions answered

## 2015-04-12 NOTE — Progress Notes (Signed)
Rehab Admissions Coordinator Note:  Patient was screened by Retta Diones for appropriateness for an Inpatient Acute Rehab Consult.  At this time, we are recommending Inpatient Rehab consult.  Retta Diones 04/12/2015, 2:40 PM  I can be reached at [phone].

## 2015-04-12 NOTE — Evaluation (Signed)
Physical Therapy Evaluation Patient Details Name: Darren Harrison MRN: [id number] DOB: 06-19-1955 Today's Date: 04/12/2015   History of Present Illness  Pt admitted for C4-5, C5-6 anterior cervical discectomy, fusion, and plating due to several months progressive weakness and difficulty with ambulation.   Pt also found to have significant lumbar spinal stenosis.   PMH:   PNA,   Clinical Impression  Patient is s/p above surgery resulting in functional limitations due to the deficits listed below (see PT Problem List). Darren Harrison presents w/ Bil UE and LE weakness as well as impaired coordination, balance, and sensation.  He requires +2 assist for safe ambulation and transfers.  He is very extremely motivated to return to independence and return to work as soon as possible.  Highly recommend pt for CIR. Patient will benefit from skilled PT to increase their independence and safety with mobility to allow discharge to the venue listed below.      Follow Up Recommendations CIR;Supervision for mobility/OOB    Equipment Recommendations  Rolling walker with 5" wheels (bariatric RW)    Recommendations for Other Services Rehab consult     Precautions / Restrictions Precautions Precautions: Cervical;Fall Precaution Comments: Reviewed cervical precautions w/ pt Required Braces or Orthoses: Cervical Brace Cervical Brace: Hard collar;At all times Restrictions Weight Bearing Restrictions: No      Mobility  Bed Mobility Overal bed mobility: Needs Assistance Bed Mobility: Supine to Sit     Supine to sit: Min guard     General bed mobility comments: Pt long sitting in bed upon PT arrival.  Pt requires increased time and cues to avoid twisting his neck when turning to sit EOB.  Transfers Overall transfer level: Needs assistance Equipment used: Rolling walker (2 wheeled) Transfers: Sit to/from Stand Sit to Stand: Min assist;+2 physical assistance Stand pivot transfers: Min assist;+2 physical  assistance       General transfer comment: Assist to boost up to standing while keeping RW steady.  Cues for hand placement.  Pt slow to stand and requires assist to control descent to recliner chair.  Ambulation/Gait Ambulation/Gait assistance: +2 safety/equipment;Min assist Ambulation Distance (Feet): 300 Feet Assistive device: Rolling walker (2 wheeled) Gait Pattern/deviations: Step-through pattern;Decreased stride length;Antalgic;Ataxic;Trunk flexed   Gait velocity interpretation: Below normal speed for age/gender General Gait Details: Assist managing RW as pt pushing it too far ahead w/ flexed posture.  Cues for upright.  Pt requires standing rest break x3 due to SOB.  SpO2 reading 88% but poor waveform.  HR up to 139 while ambulating.   Stairs            Wheelchair Mobility    Modified Rankin (Stroke Patients Only)       Balance Overall balance assessment: Needs assistance Sitting-balance support: Feet supported;No upper extremity supported Sitting balance-Leahy Scale: Fair     Standing balance support: Bilateral upper extremity supported;During functional activity Standing balance-Leahy Scale: Poor Standing balance comment: Relies heavily on RW and assist for support                             Pertinent Vitals/Pain Pain Assessment: No/denies pain    Home Living Family/patient expects to be discharged to:: Private residence Living Arrangements: Spouse/significant other Available Help at Discharge: Family;Available PRN/intermittently Type of Home: House Home Access: Level entry     Home Layout: Two level;Able to live on main level with bedroom/bathroom Home Equipment: Walker - standard;Shower seat;Grab bars - toilet;Wheelchair - [employer]  chair Additional Comments: wife works during the day, comes home at lunch and is only 15 mins away if pt needs assist     Prior Function Level of Independence: Needs assistance   Gait / Transfers  Assistance Needed: had only been performing stand pivot transfers to w/c/chair for 2-3 wks PTA, with wife assisting   ADL's / Homemaking Assistance Needed: Pt reports he had stopped working, was unable to drive 2 wks PTA, and was requiring assist with bathing, dressing, toileting as well as with functional transfers         Hand Dominance   Dominant Hand: Right    Extremity/Trunk Assessment   Upper Extremity Assessment: Defer to OT evaluation RUE Deficits / Details: Rt UE grossly 2/5 shoulder; 3+/5 elbow and hand     RUE Sensation: decreased light touch LUE Deficits / Details: Pt with h/o Lt shoulder injury.  He tends to abduct Lt shoulder when attempting to elevate shoulder - he states this is his normal movement pattern.   elbow distally 3+/5   Lower Extremity Assessment: RLE deficits/detail;LLE deficits/detail RLE Deficits / Details: 3/5 hip flexion and DF; 4/5 knee flexion and extension LLE Deficits / Details: hip flexion 4/5     Communication   Communication: No difficulties  Cognition Arousal/Alertness: Awake/alert Behavior During Therapy: WFL for tasks assessed/performed Overall Cognitive Status: Within Functional Limits for tasks assessed                      General Comments General comments (skin integrity, edema, etc.): Pt is very eager to regain independence and return to work     Exercises General Exercises - Lower Extremity Ankle Circles/Pumps: AROM;Both;5 reps;Seated Long Arc Quad: AROM;Both;5 reps;Seated      Assessment/Plan    PT Assessment Patient needs continued PT services  PT Diagnosis Difficulty walking;Abnormality of gait   PT Problem List Decreased strength;Decreased activity tolerance;Decreased balance;Decreased mobility;Decreased coordination;Decreased knowledge of use of DME;Decreased safety awareness;Impaired sensation  PT Treatment Interventions DME instruction;Gait training;Functional mobility training;Therapeutic  activities;Therapeutic exercise;Balance training;Neuromuscular re-education;Patient/family education   PT Goals (Current goals can be found in the Care Plan section) Acute Rehab PT Goals Patient Stated Goal: To go back to work ASAP, and to walk his daughters down the aisle (getting married in April and July) PT Goal Formulation: With patient Time For Goal Achievement: 04/26/15 Potential to Achieve Goals: Good    Frequency Min 4X/week   Barriers to discharge Decreased caregiver support intermittent assist    Co-evaluation               End of Session Equipment Utilized During Treatment: Gait belt Activity Tolerance: Patient limited by fatigue;Patient tolerated treatment well Patient left: in chair;with call bell/phone within reach;Other (comment) (w/ OT) Nurse Communication: Mobility status    Functional Assessment Tool Used: Clinical Judgement Functional Limitation: Mobility: Walking and moving around Mobility: Walking and Moving Around Current Status [id number]): At least 20 percent but less than 40 percent impaired, limited or restricted Mobility: Walking and Moving Around Goal Status [phone]): At least 1 percent but less than 20 percent impaired, limited or restricted    Time: [id number] PT Time Calculation (min) (ACUTE ONLY): 33 min   Charges:   PT Evaluation $PT Eval Moderate Complexity: 1 Procedure PT Treatments $Gait Training: 8-22 mins   PT G Codes:   PT G-Codes **NOT FOR INPATIENT CLASS** Functional Assessment Tool Used: Clinical Judgement Functional Limitation: Mobility: Walking and moving around Mobility: Walking and Moving Around Current Status (  [id number]): At least 20 percent but less than 40 percent impaired, limited or restricted Mobility: Walking and Moving Around Goal Status [phone]): At least 1 percent but less than 20 percent impaired, limited or restricted   Collie Siad PT, DPT  Pager: [phone] Phone: [phone] 04/12/2015, 3:02 PM

## 2015-04-13 DIAGNOSIS — M50021 Cervical disc disorder at C4-C5 level with myelopathy: Secondary | ICD-10-CM | POA: Diagnosis not present

## 2015-04-13 NOTE — Progress Notes (Addendum)
Occupational Therapy Evaluation Addendum:    04-14-15 1300  OT G-codes **NOT FOR INPATIENT CLASS**  Functional Limitation Self care  Self Care Current Status [phone]) CL  Self Care Goal Status [id number]) Riley Lam, OTR/L [phone]

## 2015-04-13 NOTE — Progress Notes (Signed)
I met with pt at bedside to discuss that Darren Harrison has denied approval to admit pt to inpt rehab. They recommend SNF level of rehab or HH/OP. Pt is disappointed but does not want SNF rehab at this time. He prefers Outpatient rehab and prefers it to be arranged at [facility] [phone]. I have alerted RN CM. I will sign off. [phone]

## 2015-04-13 NOTE — Progress Notes (Signed)
Physical Therapy Treatment Patient Details Name: Shlok Eno MRN: [id number] DOB: 06-Nov-1955 Today's Date: 04/13/2015    History of Present Illness Pt admitted for C4-5, C5-6 anterior cervical discectomy, fusion, and plating due to several months progressive weakness and difficulty with ambulation.   Pt also found to have significant lumbar spinal stenosis.   PMH:   PNA,     PT Comments    Pt very motivated to participate in therapy. He is progressing well. Continue per POC.  Follow Up Recommendations  CIR;Supervision for mobility/OOB     Equipment Recommendations  Rolling walker with 5" wheels    Recommendations for Other Services Rehab consult     Precautions / Restrictions Precautions Precautions: Cervical;Fall Required Braces or Orthoses: Cervical Brace Cervical Brace: Hard collar;At all times Restrictions Weight Bearing Restrictions: No    Mobility  Bed Mobility               General bed mobility comments: Pt received in bedside chair.  Transfers   Equipment used: Rolling walker (2 wheeled)   Sit to Stand: +2 safety/equipment;Min assist         General transfer comment: Assist to boost up and stabilize initial standing balance.  Ambulation/Gait Ambulation/Gait assistance: Min assist;+2 safety/equipment Ambulation Distance (Feet): 250 Feet (with one seated rest break after 125 feet) Assistive device: Rolling walker (2 wheeled) Gait Pattern/deviations: Step-through pattern;Ataxic;Decreased stride length   Gait velocity interpretation: Below normal speed for age/gender General Gait Details: Decreased trunk control noted. Cues for decreased speed in order to increase focus on core strength and stability. Cues for posture and RW management. Right knee buckling with fatigue requiring seated rest break.   Stairs            Wheelchair Mobility    Modified Rankin (Stroke Patients Only)       Balance   Sitting-balance support: Feet  supported;No upper extremity supported Sitting balance-Leahy Scale: Fair     Standing balance support: Bilateral upper extremity supported;During functional activity Standing balance-Leahy Scale: Poor Standing balance comment: Relies heavily on RW in stance.                    Cognition Arousal/Alertness: Awake/alert Behavior During Therapy: WFL for tasks assessed/performed Overall Cognitive Status: Within Functional Limits for tasks assessed                      Exercises      General Comments        Pertinent Vitals/Pain Pain Assessment: No/denies pain    Home Living                      Prior Function            PT Goals (current goals can now be found in the care plan section) Acute Rehab PT Goals Patient Stated Goal: To go back to work ASAP, and to walk his daughters down the aisle (getting married in April and July) PT Goal Formulation: With patient Time For Goal Achievement: 04/26/15 Potential to Achieve Goals: Good Progress towards PT goals: Progressing toward goals    Frequency  Min 4X/week    PT Plan Current plan remains appropriate    Co-evaluation             End of Session Equipment Utilized During Treatment: Gait belt Activity Tolerance: Patient tolerated treatment well Patient left: in chair;with family/visitor present;with call bell/phone within reach     Time: 1012-1037  PT Time Calculation (min) (ACUTE ONLY): 25 min  Charges:  $Gait Training: 23-37 mins                    G Codes:  Functional Assessment Tool Used: Clinical Judgement Functional Limitation: Mobility: Walking and moving around Mobility: Walking and Moving Around Current Status [phone]): At least 20 percent but less than 40 percent impaired, limited or restricted Mobility: Walking and Moving Around Goal Status [phone]): At least 1 percent but less than 20 percent impaired, limited or restricted   [name] 04/13/2015, 12:08 PM

## 2015-04-13 NOTE — Care Management Note (Signed)
Case Management Note  Patient Details  Name: Darren Harrison MRN: [id number] Date of Birth: 05-Mar-1955  Subjective/Objective:   Patient underwent ACDF. Patient is from home with his wife.                  Action/Plan: Recs are for CIR. CM continuing to follow for discharge needs.   Expected Discharge Date:                  Expected Discharge Plan:     In-House Referral:     Discharge planning Services     Post Acute Care Choice:    Choice offered to:     DME Arranged:    DME Agency:     HH Arranged:    [place] Agency:     Status of Service:     Medicare Important Message Given:    Date Medicare IM Given:    Medicare IM give by:    Date Additional Medicare IM Given:    Additional Medicare Important Message give by:     If discussed at [place] of Stay Meetings, dates discussed:    Additional Comments:  Pollie Friar, RN 04/13/2015, 3:03 PM

## 2015-04-13 NOTE — Progress Notes (Signed)
Occupational Therapy Treatment Patient Details Name: Darren Harrison MRN: [id number] DOB: 1955-06-24 Today's Date: 04/13/2015    History of present illness Pt admitted for C4-5, C5-6 anterior cervical discectomy, fusion, and plating due to several months progressive weakness and difficulty with ambulation.   Pt also found to have significant lumbar spinal stenosis.   PMH:   PNA,    OT comments  Focus of session was on fine motor coordination and strengthening activities. Pt tolerating fine motor coordination/strengthening exercises with use of yellow theraputty; able to return demo exercises. Pt continues to report numbness and tingling in bilateral UEs (mainly hands) but feels that it is better than prior to surgery. Continue to feel that CIR would be the best post acute rehab option for pt, however, upgraded follow up to outpatient OT secondary to insurance denial of CIR admission. Will continue to follow pt acutely.    Follow Up Recommendations  Outpatient OT;Supervision/Assistance - 24 hour    Equipment Recommendations  3 in 1 bedside comode    Recommendations for Other Services      Precautions / Restrictions Precautions Precautions: Cervical;Fall Required Braces or Orthoses: Cervical Brace Cervical Brace: Hard collar;At all times Restrictions Weight Bearing Restrictions: No       Mobility Bed Mobility               General bed mobility comments: Not assessed at this time  Transfers                 General transfer comment: Not assessed at this time    Balance                                   ADL Overall ADL's : Needs assistance/impaired                                       General ADL Comments: Educated pt on theraputty and fine motor coordination exercises. Provided pt with yellow theraputty and he was able to return demo exercises.      Vision                     Perception     Praxis      Cognition    Behavior During Therapy: WFL for tasks assessed/performed Overall Cognitive Status: Within Functional Limits for tasks assessed                       Extremity/Trunk Assessment               Exercises     Shoulder Instructions       General Comments      Pertinent Vitals/ Pain       Pain Assessment: 0-10 Pain Score: 5  Pain Location: neck Pain Descriptors / Indicators: Sore Pain Intervention(s): Limited activity within patient's tolerance;Monitored during session  Home Living                                          Prior Functioning/Environment              Frequency Min 2X/week     Progress Toward Goals  OT Goals(current goals can now be found in the  care plan section)  Progress towards OT goals: Progressing toward goals  Acute Rehab OT Goals Patient Stated Goal: To go back to work ASAP, and to walk his daughters down the aisle (getting married in April and July) OT Goal Formulation: With patient  Plan Discharge plan needs to be updated    Co-evaluation                 End of Session Equipment Utilized During Treatment: Cervical collar;Other (comment) (theraputty)   Activity Tolerance Patient tolerated treatment well   Patient Left in bed;with call bell/phone within reach   Nurse Communication          Time: [id number] OT Time Calculation (min): 16 min  Charges: OT General Charges $OT Visit: 1 Procedure OT Treatments $Therapeutic Exercise: 8-22 mins  Binnie Kand M.S., OTR/L Pager: [phone]  04/13/2015, 4:45 PM

## 2015-04-13 NOTE — Progress Notes (Signed)
Patient ID: Darren Harrison, male   DOB: [birth year], 60 y.o.   MRN: [id number] Subjective:  The patient is alert and pleasant. He looks well.  Objective: Vital signs in last 24 hours: Temp:  [98.1 F (36.7 C)-98.9 F (37.2 C)] 98.7 F (37.1 C) (03/01 0928) Pulse Rate:  [68-85] 76 (03/01 0928) Resp:  [16-22] 16 (03/01 0928) BP: (136-169)/(79-100) 139/88 mmHg (03/01 0928) SpO2:  [92 %-98 %] 95 % (03/01 0928)  Intake/Output from previous day: 02/28 0701 - 03/01 0700 In: 480 [P.O.:480] Out: 1200 [Urine:1200] Intake/Output this shift: Total I/O In: 240 [P.O.:240] Out: -   Physical exam the patient is alert and pleasant. He is moving all 4 extremities well. His dressing is clean and dry. There is no hematoma or shift.  Lab Results:  Recent Labs  04/11/15 0841  WBC 7.2  HGB 14.2  HCT 41.6  PLT 218   BMET  Recent Labs  04/11/15 0841  NA 141  K 4.1  CL 107  CO2 23  GLUCOSE 94  BUN 12  CREATININE 0.68  CALCIUM 9.4    Studies/Results: Dg Cervical Spine 2-3 Views  04/11/2015  CLINICAL DATA:  ACDF C4-C6. EXAM: CERVICAL SPINE - 2-3 VIEW COMPARISON:  03/22/2015 cervical spine MRI. FINDINGS: Portable cross-table lateral intraoperative radiograph demonstrates anterior approach surgical marking device terminating in the anterior C3-4 disc space. Subsequent portable cross-table lateral intraoperative radiograph demonstrates anterior surgical plate with interlocking vertebral body screws from C4-C6. Surgical sponges overlie the prevertebral soft tissues at the C3 level and overlie the anterior neck at the C5 level. Oral route tube enters the upper airway. IMPRESSION: Anterior approach surgical marking device position as described, and subsequent postsurgical changes from C4-C6 ACDF. Electronically Signed   By: Ilona Sorrel M.D.   On: 04/11/2015 13:02    Assessment/Plan: Cervical myelopathy, Quadraparesis: The patient seems to be doing well status post his surgery. It looks like he'll  need rehabilitation. Arrangements are being made.      Vonetta Foulk D 04/13/2015, 11:57 AM

## 2015-04-13 NOTE — Progress Notes (Signed)
I met with pt and his wife at bedside to discuss a possible inpt rehab admission. Both prefer inpt rehab admission and second choice would be oupt rehab at a previous orthopedic office they have used in [place], [place]. I will begin [employer] authorization for possible admission pedning their approval and bed availability tomorrow. [phone]

## 2015-04-14 DIAGNOSIS — M50021 Cervical disc disorder at C4-C5 level with myelopathy: Secondary | ICD-10-CM | POA: Diagnosis not present

## 2015-04-14 MED ORDER — OXYCODONE-ACETAMINOPHEN 5-325 MG PO TABS: 1.0000 | ORAL_TABLET | ORAL | Status: DC | PRN

## 2015-04-14 MED ORDER — CYCLOBENZAPRINE HCL 10 MG PO TABS: 10.0000 mg | ORAL_TABLET | Freq: Three times a day (TID) | ORAL | Status: DC | PRN

## 2015-04-14 MED ORDER — DOCUSATE SODIUM 100 MG PO CAPS: 100.0000 mg | ORAL_CAPSULE | Freq: Two times a day (BID) | ORAL | Status: DC

## 2015-04-14 NOTE — Care Management Note (Signed)
Case Management Note  Patient Details  Name: Darren Harrison MRN: [id number] Date of Birth: [birth year]  Subjective/Objective:                    Action/Plan: Patient discharging home with self care. Patient requesting a walker. CM called Dr Arnoldo Morale office and left a message with his Network engineer. Bedside RN updated.   Expected Discharge Date:                  Expected Discharge Plan:     In-House Referral:     Discharge planning Services     Post Acute Care Choice:    Choice offered to:     DME Arranged:    DME Agency:     HH Arranged:    [place] Agency:     Status of Service:     Medicare Important Message Given:    Date Medicare IM Given:    Medicare IM give by:    Date Additional Medicare IM Given:    Additional Medicare Important Message give by:     If discussed at [place] of Stay Meetings, dates discussed:    Additional Comments:  Pollie Friar, RN 04/14/2015, 4:14 PM

## 2015-04-14 NOTE — Clinical Social Work Note (Signed)
CSW received referral for SNF.  Case discussed with case manager, and plan is to discharge home.  CSW to sign off please re-consult if social work needs arise.  Tessica Cupo R. Josephene Marrone, MSW, LCSWA [phone]  

## 2015-04-14 NOTE — Progress Notes (Signed)
D/C orders received, pt for D/C home today.  IV and telemetry D/C.  Rx and D/C instructions given with verbalized understanding.  Family at bedside to assist with D/C.  Staff brought pt downstairs via wheelchair.  

## 2015-04-14 NOTE — Progress Notes (Signed)
Physical Therapy Treatment Patient Details Name: Darren Harrison MRN: [id number] DOB: 12-24-1955 Today's Date: 04/14/2015    History of Present Illness Pt admitted for C4-5, C5-6 anterior cervical discectomy, fusion, and plating due to several months progressive weakness and difficulty with ambulation.   Pt also found to have significant lumbar spinal stenosis.   PMH:   PNA,     PT Comments    Patient continues to improve in strength, endurance and safety with RW. Currently pt feels he is safe to discharge home with his wife's assistance (from a mobility standpoint).   Follow Up Recommendations  Outpatient PT;Supervision for mobility/OOB (CIR was denied by insurance; pt feels he can get to OPPT)     Equipment Recommendations  Rolling walker with 5" wheels    Recommendations for Other Services       Precautions / Restrictions Precautions Precautions: Cervical;Fall Required Braces or Orthoses: Cervical Brace Cervical Brace: Hard collar;At all times Restrictions Weight Bearing Restrictions: No    Mobility  Bed Mobility               General bed mobility comments: Pt received in bedside chair. Stated he did not need to practice  Transfers Overall transfer level: Needs assistance Equipment used: Rolling walker (2 wheeled) Transfers: Sit to/from Stand Sit to Stand: Min assist         General transfer comment: Assist to anchor RW as pt prefers/feels safer using one hand on RW and one pushing from chair; no lifting assist needed  Ambulation/Gait Ambulation/Gait assistance: Min guard Ambulation Distance (Feet): 180 Feet Assistive device: Rolling walker (2 wheeled) Gait Pattern/deviations: Step-through pattern;Decreased stride length;Decreased dorsiflexion - right;Decreased dorsiflexion - left;Steppage   Gait velocity interpretation: Below normal speed for age/gender General Gait Details: patient able to safely judge how far he could walk and walk back to room without  sitting; vc and tactile cues for upright posture; educated on importance of using RW until neck heals due to high fall risk   Stairs            Wheelchair Mobility    Modified Rankin (Stroke Patients Only)       Balance           Standing balance support: No upper extremity supported Standing balance-Leahy Scale: Poor Standing balance comment: appears unsteady with weak trunk and legs                    Cognition Arousal/Alertness: Awake/alert Behavior During Therapy: WFL for tasks assessed/performed Overall Cognitive Status: Within Functional Limits for tasks assessed                      Exercises      General Comments General comments (skin integrity, edema, etc.): Discussed car transfers and determined safe method for pt/wife to use. Pt reports he feels OK with transfers re: going to [place], however PT clinic has moved and he does not know how accessible the entrance is.Discussed having his wife go by before his first visit and investigate. He knows he can move to a different OP clinic if needed.       Pertinent Vitals/Pain Pain Assessment: Faces Faces Pain Scale: Hurts little more Pain Location: neck Pain Descriptors / Indicators: Guarding;Operative site guarding Pain Intervention(s): Limited activity within patient's tolerance;Monitored during session    Home Living                      Prior Function  PT Goals (current goals can now be found in the care plan section) Acute Rehab PT Goals Patient Stated Goal: To go back to work ASAP, and to walk his daughters down the aisle (getting married in April and July) Time For Goal Achievement: 04/26/15 Progress towards PT goals: Progressing toward goals    Frequency  Min 4X/week    PT Plan Discharge plan needs to be updated    Co-evaluation             End of Session Equipment Utilized During Treatment: Gait belt;Cervical collar Activity Tolerance: Patient  tolerated treatment well Patient left: in chair;with call bell/phone within reach     Time: 1024-1047 PT Time Calculation (min) (ACUTE ONLY): 23 min  Charges:  $Gait Training: 8-22 mins $Self Care/Home Management: 8-22                    G Codes:      Darren Harrison 04-16-15, 11:01 AM Pager [phone]

## 2015-04-14 NOTE — Discharge Summary (Signed)
Physician Discharge Summary  Patient ID: Darren Harrison MRN: [id number] DOB/AGE: [birth year] 60 y.o.  Admit date: 04/11/2015 Discharge date: 04/14/2015  Admission Diagnoses: C5-6 and C6-7 herniated disc, spondylosis, stenosis, cervicalgia, cervical myelopathy, Quadraparesis  Discharge Diagnoses: The same Active Problems:   Cervical spondylosis with myelopathy   Discharged Condition: good  Hospital Course: I performed a C5-6 and C6-7 anterior cervical discectomy, fusion, and plating on the patient on 04/11/2015. The surgery went well. The patient improved immediately after surgery.  The remainder of the patient's postoperative course was unremarkable. The patient was turned down for inpatient rehabilitation. Instead he prefers outpatient physical therapy/occupational therapy.  On 04/14/15 the patient requested discharge to home. He was given written and oral discharge instructions. All his questions were answered.  Consults: Physical therapy Significant Diagnostic Studies: None Treatments: C5-6 and C6-7 anterior cervical discectomy, fusion, and plating. Discharge Exam: Blood pressure 149/97, pulse 92, temperature 98.1 F (36.7 C), temperature source Oral, resp. rate 20, height 5\' 11"  (1.803 m), weight 125.5 kg (276 lb 10.8 oz), SpO2 98 %. The patient is alert and pleasant. His wound is healing well without hematoma or shift. He is moving all 4 extremities well.  Disposition: Home  Discharge Instructions    Call MD for:  difficulty breathing, headache or visual disturbances    Complete by:  As directed      Call MD for:  extreme fatigue    Complete by:  As directed      Call MD for:  hives    Complete by:  As directed      Call MD for:  persistant dizziness or light-headedness    Complete by:  As directed      Call MD for:  persistant nausea and vomiting    Complete by:  As directed      Call MD for:  redness, tenderness, or signs of infection (pain, swelling, redness, odor or  green/yellow discharge around incision site)    Complete by:  As directed      Call MD for:  severe uncontrolled pain    Complete by:  As directed      Call MD for:  temperature >100.4    Complete by:  As directed      Diet - low sodium heart healthy    Complete by:  As directed      Discharge instructions    Complete by:  As directed   Call [phone] for a followup appointment. Take a stool softener while you are using pain medications.     Driving Restrictions    Complete by:  As directed   Do not drive for 2 weeks.     Increase activity slowly    Complete by:  As directed      Lifting restrictions    Complete by:  As directed   Do not lift more than 5 pounds. No excessive bending or twisting.     May shower / Bathe    Complete by:  As directed   He may shower after the pain she is removed 3 days after surgery. Leave the incision alone.     No dressing needed    Complete by:  As directed             Medication List    STOP taking these medications        acetaminophen 500 MG tablet  Commonly known as:  TYLENOL     diclofenac 75 MG EC tablet  Commonly known  as:  VOLTAREN      TAKE these medications        CENTRUM ADULTS PO  Take 1 tablet by mouth daily.     cyclobenzaprine 10 MG tablet  Commonly known as:  FLEXERIL  Take 10 mg by mouth 3 (three) times daily as needed for muscle spasms.     cyclobenzaprine 10 MG tablet  Commonly known as:  FLEXERIL  Take 1 tablet (10 mg total) by mouth 3 (three) times daily as needed for muscle spasms.     docusate sodium 100 MG capsule  Commonly known as:  COLACE  Take 1 capsule (100 mg total) by mouth 2 (two) times daily.     finasteride 5 MG tablet  Commonly known as:  PROSCAR  Take 5 mg by mouth daily.     oxyCODONE-acetaminophen 5-325 MG tablet  Commonly known as:  PERCOCET/ROXICET  Take 1-2 tablets by mouth every 4 (four) hours as needed for moderate pain.     tamsulosin 0.4 MG Caps capsule  Commonly known as:   FLOMAX  Take 0.4 mg by mouth 2 (two) times daily.         SignedOphelia Charter 04/14/2015, 2:35 PM

## 2015-04-15 NOTE — Progress Notes (Signed)
CM received a call from CIR liaison that patient had called requesting assistance arranging outpatient PT/OT. CM reviewed chart and found no outpatient therapy orders.  CM spoke with Jacqlyn Larsen at Dr Arnoldo Morale' office, who requested that CM have patient call their office to make arrangements.  CM called patient at [phone] to instruct him on how to proceed.  Patient was agreeable to plan and verbalized understanding.   Lorne Skeens RN, MSN [phone]

## 2015-08-26 ENCOUNTER — Other Ambulatory Visit: Payer: Self-pay | Admitting: Neurosurgery

## 2015-10-12 NOTE — Pre-Procedure Instructions (Signed)
Darren Harrison  10/12/2015     Your procedure is scheduled on : Tuesday October 20, 2015 at 11:54 AM.  Report to [facility] Admitting at 8:30 AM.  Call this number if you have problems the morning of surgery: [phone]    Remember:  Do not eat food or drink liquids after midnight.  Take these medicines the morning of surgery with A SIP OF WATER : Acetaminophen (Tylenol) if needed, Oxycodone (Percocet) if needed   Stop taking any vitamins, herbal medications/supplements, NSAIDs, Ibuprofen, Advil, Motrin, Aleve, Diclofenac/Voltaren, etc today   Do not wear jewelry.  Do not wear lotions, powders, or cologne, or deoderant.  Men may shave face and neck.  Do not bring valuables to the hospital.  [facility] is not responsible for any belongings or valuables.  Contacts, dentures or bridgework may not be worn into surgery.  Leave your suitcase in the car.  After surgery it may be brought to your room.  For patients admitted to the hospital, discharge time will be determined by your treatment team.  Patients discharged the day of surgery will not be allowed to drive home.   Name and phone number of your driver:    Special instructions:  Shower using CHG soap the night before and the morning of your surgery  Please read over the following fact sheets that you were given. Pain Booklet and MRSA Information

## 2015-10-13 ENCOUNTER — Encounter (HOSPITAL_COMMUNITY): Payer: Self-pay

## 2015-10-13 ENCOUNTER — Other Ambulatory Visit: Payer: Self-pay

## 2015-10-13 ENCOUNTER — Encounter (HOSPITAL_COMMUNITY)
Admission: RE | Admit: 2015-10-13 | Discharge: 2015-10-13 | Disposition: A | Discharge status: Home / Self Care | Payer: Managed Care, Other (non HMO) | Source: Ambulatory Visit | Attending: Neurosurgery | Admitting: Neurosurgery

## 2015-10-13 DIAGNOSIS — I10 Essential (primary) hypertension: Secondary | ICD-10-CM | POA: Diagnosis not present

## 2015-10-13 DIAGNOSIS — Z01812 Encounter for preprocedural laboratory examination: Secondary | ICD-10-CM | POA: Diagnosis not present

## 2015-10-13 DIAGNOSIS — M4806 Spinal stenosis, lumbar region: Secondary | ICD-10-CM | POA: Insufficient documentation

## 2015-10-13 DIAGNOSIS — Z01818 Encounter for other preprocedural examination: Secondary | ICD-10-CM | POA: Insufficient documentation

## 2015-10-13 HISTORY — DX: Gastro-esophageal reflux disease without esophagitis: K21.9

## 2015-10-13 HISTORY — DX: Essential (primary) hypertension: I10

## 2015-10-13 HISTORY — DX: Personal history of other medical treatment: Z92.89

## 2015-10-13 LAB — BASIC METABOLIC PANEL
ANION GAP: 7 (ref 5–15)
BUN: 13 mg/dL (ref 6–20)
CALCIUM: 9.3 mg/dL (ref 8.9–10.3)
CO2: 26 mmol/L (ref 22–32)
Chloride: 106 mmol/L (ref 101–111)
Creatinine, Ser: 0.8 mg/dL (ref 0.61–1.24)
Glucose, Bld: 81 mg/dL (ref 65–99)
Potassium: 4.3 mmol/L (ref 3.5–5.1)
SODIUM: 139 mmol/L (ref 135–145)

## 2015-10-13 LAB — CBC
HCT: 44.2 % (ref 39.0–52.0)
HEMOGLOBIN: 14.9 g/dL (ref 13.0–17.0)
MCH: 29.3 pg (ref 26.0–34.0)
MCHC: 33.7 g/dL (ref 30.0–36.0)
MCV: 86.8 fL (ref 78.0–100.0)
PLATELETS: 210 10*3/uL (ref 150–400)
RBC: 5.09 MIL/uL (ref 4.22–5.81)
RDW: 13 % (ref 11.5–15.5)
WBC: 6.7 10*3/uL (ref 4.0–10.5)

## 2015-10-13 LAB — SURGICAL PCR SCREEN
MRSA, PCR: NEGATIVE
Staphylococcus aureus: NEGATIVE

## 2015-10-13 NOTE — Progress Notes (Addendum)
Mr Yellowhair denies chest pain or shortness of breath.  Blood pressure 154/100, rechecked after sitting 7 minutes 155/90.  Patient reports that he was running high in February, patient does not have a PCP, "I been  Working with these Drs with this pain and a urologist , haven't had a chance to get a PCP."  EKG obtained  - NSR.

## 2015-10-13 NOTE — Progress Notes (Signed)
I called a prescription for Mupirocin ointment to [facility] [address], [place] ,[place].

## 2015-10-19 MED ORDER — DEXTROSE 5 % IV SOLN
3.0000 g | INTRAVENOUS | Status: AC
  Administered 2015-10-20: 3 g via INTRAVENOUS
  Filled 2015-10-19: qty 3000

## 2015-10-20 ENCOUNTER — Ambulatory Visit (HOSPITAL_COMMUNITY): Payer: Managed Care, Other (non HMO) | Admitting: Anesthesiology

## 2015-10-20 ENCOUNTER — Encounter (HOSPITAL_COMMUNITY)
Admission: RE | Disposition: A | Discharge status: Home / Self Care | Payer: Self-pay | Source: Ambulatory Visit | Attending: Neurosurgery

## 2015-10-20 ENCOUNTER — Ambulatory Visit (HOSPITAL_COMMUNITY): Payer: Managed Care, Other (non HMO)

## 2015-10-20 ENCOUNTER — Ambulatory Visit (HOSPITAL_COMMUNITY)
Admission: RE | Admit: 2015-10-20 | Discharge: 2015-10-21 | Disposition: A | Discharge status: Home / Self Care | Payer: Managed Care, Other (non HMO) | Source: Ambulatory Visit | Attending: Neurosurgery | Admitting: Neurosurgery

## 2015-10-20 ENCOUNTER — Encounter (HOSPITAL_COMMUNITY): Payer: Self-pay | Admitting: *Deleted

## 2015-10-20 DIAGNOSIS — M5416 Radiculopathy, lumbar region: Secondary | ICD-10-CM | POA: Insufficient documentation

## 2015-10-20 DIAGNOSIS — R3911 Hesitancy of micturition: Secondary | ICD-10-CM | POA: Insufficient documentation

## 2015-10-20 DIAGNOSIS — M4806 Spinal stenosis, lumbar region: Secondary | ICD-10-CM | POA: Insufficient documentation

## 2015-10-20 DIAGNOSIS — Z791 Long term (current) use of non-steroidal anti-inflammatories (NSAID): Secondary | ICD-10-CM | POA: Insufficient documentation

## 2015-10-20 DIAGNOSIS — Z6839 Body mass index (BMI) 39.0-39.9, adult: Secondary | ICD-10-CM | POA: Insufficient documentation

## 2015-10-20 DIAGNOSIS — Z419 Encounter for procedure for purposes other than remedying health state, unspecified: Secondary | ICD-10-CM

## 2015-10-20 DIAGNOSIS — Z87891 Personal history of nicotine dependence: Secondary | ICD-10-CM | POA: Diagnosis not present

## 2015-10-20 DIAGNOSIS — N401 Enlarged prostate with lower urinary tract symptoms: Secondary | ICD-10-CM | POA: Diagnosis not present

## 2015-10-20 DIAGNOSIS — Z981 Arthrodesis status: Secondary | ICD-10-CM | POA: Insufficient documentation

## 2015-10-20 DIAGNOSIS — I10 Essential (primary) hypertension: Secondary | ICD-10-CM | POA: Diagnosis not present

## 2015-10-20 DIAGNOSIS — E669 Obesity, unspecified: Secondary | ICD-10-CM | POA: Diagnosis not present

## 2015-10-20 DIAGNOSIS — Z79899 Other long term (current) drug therapy: Secondary | ICD-10-CM | POA: Insufficient documentation

## 2015-10-20 DIAGNOSIS — M48062 Spinal stenosis, lumbar region with neurogenic claudication: Secondary | ICD-10-CM | POA: Diagnosis present

## 2015-10-20 HISTORY — PX: LUMBAR LAMINECTOMY/DECOMPRESSION MICRODISCECTOMY: SHX5026

## 2015-10-20 SURGERY — LUMBAR LAMINECTOMY/DECOMPRESSION MICRODISCECTOMY 3 LEVELS
Anesthesia: General

## 2015-10-20 MED ORDER — OXYCODONE HCL 5 MG PO TABS
ORAL_TABLET | ORAL | Status: AC
  Filled 2015-10-20: qty 1

## 2015-10-20 MED ORDER — PHENOL 1.4 % MT LIQD: 1.0000 | OROMUCOSAL | Status: DC | PRN

## 2015-10-20 MED ORDER — PHENYLEPHRINE HCL 10 MG/ML IJ SOLN
INTRAVENOUS | Status: DC | PRN
  Administered 2015-10-20: 20 ug/min via INTRAVENOUS

## 2015-10-20 MED ORDER — PHENYLEPHRINE 40 MCG/ML (10ML) SYRINGE FOR IV PUSH (FOR BLOOD PRESSURE SUPPORT)
PREFILLED_SYRINGE | INTRAVENOUS | Status: AC
  Filled 2015-10-20: qty 10

## 2015-10-20 MED ORDER — ARTIFICIAL TEARS OP OINT
TOPICAL_OINTMENT | OPHTHALMIC | Status: AC
  Filled 2015-10-20: qty 3.5

## 2015-10-20 MED ORDER — DOCUSATE SODIUM 100 MG PO CAPS
100.0000 mg | ORAL_CAPSULE | Freq: Two times a day (BID) | ORAL | Status: DC
  Administered 2015-10-20 – 2015-10-21 (×2): 100 mg via ORAL
  Filled 2015-10-20 (×2): qty 1

## 2015-10-20 MED ORDER — MENTHOL 3 MG MT LOZG: 1.0000 | LOZENGE | OROMUCOSAL | Status: DC | PRN

## 2015-10-20 MED ORDER — HYDROMORPHONE HCL 1 MG/ML IJ SOLN
INTRAMUSCULAR | Status: AC
  Administered 2015-10-20: 0.5 mg via INTRAVENOUS
  Filled 2015-10-20: qty 1

## 2015-10-20 MED ORDER — FENTANYL CITRATE (PF) 100 MCG/2ML IJ SOLN
INTRAMUSCULAR | Status: AC
  Filled 2015-10-20: qty 4

## 2015-10-20 MED ORDER — TAMSULOSIN HCL 0.4 MG PO CAPS
0.4000 mg | ORAL_CAPSULE | Freq: Every day | ORAL | Status: DC
  Administered 2015-10-20 – 2015-10-21 (×2): 0.4 mg via ORAL
  Filled 2015-10-20 (×2): qty 1

## 2015-10-20 MED ORDER — ACETAMINOPHEN 325 MG PO TABS: 325.0000 mg | ORAL_TABLET | ORAL | Status: DC | PRN

## 2015-10-20 MED ORDER — CEFAZOLIN SODIUM-DEXTROSE 2-4 GM/100ML-% IV SOLN
2.0000 g | Freq: Three times a day (TID) | INTRAVENOUS | Status: AC
  Administered 2015-10-20 – 2015-10-21 (×2): 2 g via INTRAVENOUS
  Filled 2015-10-20 (×2): qty 100

## 2015-10-20 MED ORDER — DIAZEPAM 5 MG PO TABS
ORAL_TABLET | ORAL | Status: AC
  Filled 2015-10-20: qty 1

## 2015-10-20 MED ORDER — SUGAMMADEX SODIUM 200 MG/2ML IV SOLN
INTRAVENOUS | Status: AC
  Filled 2015-10-20: qty 2

## 2015-10-20 MED ORDER — ONDANSETRON HCL 4 MG/2ML IJ SOLN
4.0000 mg | INTRAMUSCULAR | Status: DC | PRN
Start: 2015-10-20 — End: 2015-10-21

## 2015-10-20 MED ORDER — ONDANSETRON HCL 4 MG/2ML IJ SOLN
INTRAMUSCULAR | Status: AC
  Filled 2015-10-20: qty 2

## 2015-10-20 MED ORDER — HYDROCODONE-ACETAMINOPHEN 5-325 MG PO TABS: 1.0000 | ORAL_TABLET | ORAL | Status: DC | PRN

## 2015-10-20 MED ORDER — OXYCODONE HCL 5 MG PO TABS
5.0000 mg | ORAL_TABLET | Freq: Once | ORAL | Status: AC | PRN
  Administered 2015-10-20: 5 mg via ORAL

## 2015-10-20 MED ORDER — ROCURONIUM BROMIDE 100 MG/10ML IV SOLN
INTRAVENOUS | Status: DC | PRN
  Administered 2015-10-20: 20 mg via INTRAVENOUS
  Administered 2015-10-20: 60 mg via INTRAVENOUS
  Administered 2015-10-20 (×2): 10 mg via INTRAVENOUS

## 2015-10-20 MED ORDER — CYCLOBENZAPRINE HCL 10 MG PO TABS
10.0000 mg | ORAL_TABLET | Freq: Three times a day (TID) | ORAL | Status: DC | PRN
  Administered 2015-10-21 (×2): 10 mg via ORAL
  Filled 2015-10-20 (×2): qty 1

## 2015-10-20 MED ORDER — THROMBIN 20000 UNITS EX SOLR
CUTANEOUS | Status: DC | PRN
  Administered 2015-10-20: 11:00:00 via TOPICAL

## 2015-10-20 MED ORDER — MIDAZOLAM HCL 5 MG/5ML IJ SOLN
INTRAMUSCULAR | Status: DC | PRN
  Administered 2015-10-20: 2 mg via INTRAVENOUS

## 2015-10-20 MED ORDER — BUPIVACAINE LIPOSOME 1.3 % IJ SUSP
20.0000 mL | Freq: Once | INTRAMUSCULAR | Status: DC
  Filled 2015-10-20: qty 20

## 2015-10-20 MED ORDER — FINASTERIDE 5 MG PO TABS
5.0000 mg | ORAL_TABLET | Freq: Every day | ORAL | Status: DC
  Administered 2015-10-20 – 2015-10-21 (×2): 5 mg via ORAL
  Filled 2015-10-20 (×2): qty 1

## 2015-10-20 MED ORDER — OXYCODONE-ACETAMINOPHEN 5-325 MG PO TABS
ORAL_TABLET | ORAL | Status: AC
  Filled 2015-10-20: qty 2

## 2015-10-20 MED ORDER — SUCCINYLCHOLINE CHLORIDE 200 MG/10ML IV SOSY
PREFILLED_SYRINGE | INTRAVENOUS | Status: AC
  Filled 2015-10-20: qty 30

## 2015-10-20 MED ORDER — ROCURONIUM BROMIDE 10 MG/ML (PF) SYRINGE
PREFILLED_SYRINGE | INTRAVENOUS | Status: AC
  Filled 2015-10-20: qty 10

## 2015-10-20 MED ORDER — BACITRACIN ZINC 500 UNIT/GM EX OINT
TOPICAL_OINTMENT | CUTANEOUS | Status: DC | PRN
  Administered 2015-10-20: 1 via TOPICAL

## 2015-10-20 MED ORDER — CHLORHEXIDINE GLUCONATE CLOTH 2 % EX PADS: 6.0000 | MEDICATED_PAD | Freq: Once | CUTANEOUS | Status: DC

## 2015-10-20 MED ORDER — BUPIVACAINE-EPINEPHRINE (PF) 0.5% -1:200000 IJ SOLN
INTRAMUSCULAR | Status: DC | PRN
  Administered 2015-10-20: 10 mL via PERINEURAL

## 2015-10-20 MED ORDER — LACTATED RINGERS IV SOLN: INTRAVENOUS | Status: DC

## 2015-10-20 MED ORDER — PROPOFOL 10 MG/ML IV BOLUS
INTRAVENOUS | Status: AC
  Filled 2015-10-20: qty 40

## 2015-10-20 MED ORDER — PHENYLEPHRINE HCL 10 MG/ML IJ SOLN
INTRAMUSCULAR | Status: DC | PRN
  Administered 2015-10-20 (×5): 80 ug via INTRAVENOUS

## 2015-10-20 MED ORDER — MIDAZOLAM HCL 2 MG/2ML IJ SOLN
INTRAMUSCULAR | Status: AC
  Filled 2015-10-20: qty 2

## 2015-10-20 MED ORDER — ONDANSETRON HCL 4 MG/2ML IJ SOLN
INTRAMUSCULAR | Status: DC | PRN
  Administered 2015-10-20: 4 mg via INTRAVENOUS

## 2015-10-20 MED ORDER — HEMOSTATIC AGENTS (NO CHARGE) OPTIME
TOPICAL | Status: DC | PRN
  Administered 2015-10-20: 1 via TOPICAL

## 2015-10-20 MED ORDER — LIDOCAINE 2% (20 MG/ML) 5 ML SYRINGE
INTRAMUSCULAR | Status: AC
  Filled 2015-10-20: qty 5

## 2015-10-20 MED ORDER — HYDROMORPHONE HCL 1 MG/ML IJ SOLN
0.2500 mg | INTRAMUSCULAR | Status: DC | PRN
  Administered 2015-10-20 (×4): 0.5 mg via INTRAVENOUS

## 2015-10-20 MED ORDER — ACETAMINOPHEN 650 MG RE SUPP: 650.0000 mg | RECTAL | Status: DC | PRN

## 2015-10-20 MED ORDER — EPHEDRINE 5 MG/ML INJ
INTRAVENOUS | Status: AC
  Filled 2015-10-20: qty 20

## 2015-10-20 MED ORDER — SUGAMMADEX SODIUM 200 MG/2ML IV SOLN
INTRAVENOUS | Status: DC | PRN
  Administered 2015-10-20: 200 mg via INTRAVENOUS

## 2015-10-20 MED ORDER — PROPOFOL 10 MG/ML IV BOLUS
INTRAVENOUS | Status: DC | PRN
  Administered 2015-10-20: 140 mg via INTRAVENOUS
  Administered 2015-10-20: 60 mg via INTRAVENOUS

## 2015-10-20 MED ORDER — DICLOFENAC SODIUM 75 MG PO TBEC
75.0000 mg | DELAYED_RELEASE_TABLET | Freq: Every day | ORAL | Status: DC
  Administered 2015-10-20 – 2015-10-21 (×2): 75 mg via ORAL
  Filled 2015-10-20 (×2): qty 1

## 2015-10-20 MED ORDER — OXYCODONE-ACETAMINOPHEN 5-325 MG PO TABS
1.0000 | ORAL_TABLET | ORAL | Status: DC | PRN
  Administered 2015-10-20 – 2015-10-21 (×5): 2 via ORAL
  Filled 2015-10-20 (×4): qty 2

## 2015-10-20 MED ORDER — LACTATED RINGERS IV SOLN
INTRAVENOUS | Status: DC
  Administered 2015-10-20 (×2): via INTRAVENOUS

## 2015-10-20 MED ORDER — MORPHINE SULFATE (PF) 2 MG/ML IV SOLN: 1.0000 mg | INTRAVENOUS | Status: DC | PRN

## 2015-10-20 MED ORDER — 0.9 % SODIUM CHLORIDE (POUR BTL) OPTIME
TOPICAL | Status: DC | PRN
  Administered 2015-10-20: 1000 mL

## 2015-10-20 MED ORDER — THROMBIN 5000 UNITS EX SOLR
OROMUCOSAL | Status: DC | PRN
  Administered 2015-10-20: 13:00:00 via TOPICAL

## 2015-10-20 MED ORDER — ACETAMINOPHEN 160 MG/5ML PO SOLN
325.0000 mg | ORAL | Status: DC | PRN
  Filled 2015-10-20: qty 20.3

## 2015-10-20 MED ORDER — LIDOCAINE HCL (CARDIAC) 20 MG/ML IV SOLN
INTRAVENOUS | Status: DC | PRN
  Administered 2015-10-20: 80 mg via INTRAVENOUS

## 2015-10-20 MED ORDER — OXYCODONE HCL 5 MG/5ML PO SOLN: 5.0000 mg | Freq: Once | ORAL | Status: AC | PRN

## 2015-10-20 MED ORDER — SODIUM CHLORIDE 0.9 % IR SOLN
Status: DC | PRN
  Administered 2015-10-20: 11:00:00

## 2015-10-20 MED ORDER — ALUM & MAG HYDROXIDE-SIMETH 200-200-20 MG/5ML PO SUSP: 30.0000 mL | Freq: Four times a day (QID) | ORAL | Status: DC | PRN

## 2015-10-20 MED ORDER — FENTANYL CITRATE (PF) 100 MCG/2ML IJ SOLN
INTRAMUSCULAR | Status: DC | PRN
  Administered 2015-10-20: 150 ug via INTRAVENOUS
  Administered 2015-10-20: 50 ug via INTRAVENOUS

## 2015-10-20 MED ORDER — ADULT MULTIVITAMIN W/MINERALS CH
ORAL_TABLET | Freq: Every day | ORAL | Status: DC
  Administered 2015-10-20: 20:00:00 via ORAL
  Administered 2015-10-21: 1 via ORAL
  Filled 2015-10-20: qty 1

## 2015-10-20 MED ORDER — CHLORHEXIDINE GLUCONATE CLOTH 2 % EX PADS
6.0000 | MEDICATED_PAD | Freq: Once | CUTANEOUS | Status: DC
Start: 2015-10-20 — End: 2015-10-20

## 2015-10-20 MED ORDER — BUPIVACAINE LIPOSOME 1.3 % IJ SUSP
INTRAMUSCULAR | Status: DC | PRN
  Administered 2015-10-20: 20 mL

## 2015-10-20 MED ORDER — DIAZEPAM 5 MG PO TABS
5.0000 mg | ORAL_TABLET | Freq: Four times a day (QID) | ORAL | Status: DC | PRN
  Administered 2015-10-20 – 2015-10-21 (×3): 5 mg via ORAL
  Filled 2015-10-20 (×2): qty 1

## 2015-10-20 MED ORDER — ACETAMINOPHEN 325 MG PO TABS: 650.0000 mg | ORAL_TABLET | ORAL | Status: DC | PRN

## 2015-10-20 MED ORDER — BISACODYL 10 MG RE SUPP: 10.0000 mg | Freq: Every day | RECTAL | Status: DC | PRN

## 2015-10-20 SURGICAL SUPPLY — 53 items
BAG DECANTER FOR FLEXI CONT (MISCELLANEOUS) ×2 IMPLANT
BENZOIN TINCTURE PRP APPL 2/3 (GAUZE/BANDAGES/DRESSINGS) ×2 IMPLANT
BLADE CLIPPER SURG (BLADE) IMPLANT
BUR MATCHSTICK NEURO 3.0 LAGG (BURR) ×2 IMPLANT
BUR PRECISION FLUTE 6.0 (BURR) ×2 IMPLANT
CANISTER SUCT 3000ML PPV (MISCELLANEOUS) ×2 IMPLANT
DRAPE LAPAROTOMY 100X72X124 (DRAPES) ×2 IMPLANT
DRAPE MICROSCOPE LEICA (MISCELLANEOUS) ×2 IMPLANT
DRAPE POUCH INSTRU U-SHP 10X18 (DRAPES) ×2 IMPLANT
DRAPE SURG 17X23 STRL (DRAPES) ×8 IMPLANT
ELECT BLADE 4.0 EZ CLEAN MEGAD (MISCELLANEOUS) ×2
ELECT REM PT RETURN 9FT ADLT (ELECTROSURGICAL) ×2
ELECTRODE BLDE 4.0 EZ CLN MEGD (MISCELLANEOUS) ×1 IMPLANT
ELECTRODE REM PT RTRN 9FT ADLT (ELECTROSURGICAL) ×1 IMPLANT
EVACUATOR 1/8 PVC DRAIN (DRAIN) ×2 IMPLANT
GAUZE SPONGE 4X4 12PLY STRL (GAUZE/BANDAGES/DRESSINGS) ×2 IMPLANT
GAUZE SPONGE 4X4 16PLY XRAY LF (GAUZE/BANDAGES/DRESSINGS) IMPLANT
GLOVE BIO SURGEON STRL SZ8 (GLOVE) ×4 IMPLANT
GLOVE BIO SURGEON STRL SZ8.5 (GLOVE) ×2 IMPLANT
GLOVE EXAM NITRILE LRG STRL (GLOVE) IMPLANT
GLOVE EXAM NITRILE XL STR (GLOVE) IMPLANT
GLOVE EXAM NITRILE XS STR PU (GLOVE) IMPLANT
GLOVE INDICATOR 7.0 STRL GRN (GLOVE) ×6 IMPLANT
GLOVE INDICATOR 7.5 STRL GRN (GLOVE) ×4 IMPLANT
GLOVE SS N UNI LF 6.5 STRL (GLOVE) ×4 IMPLANT
GLOVE SS N UNI LF 7.0 STRL (GLOVE) ×4 IMPLANT
GOWN STRL REUS W/ TWL LRG LVL3 (GOWN DISPOSABLE) IMPLANT
GOWN STRL REUS W/ TWL XL LVL3 (GOWN DISPOSABLE) ×1 IMPLANT
GOWN STRL REUS W/TWL 2XL LVL3 (GOWN DISPOSABLE) IMPLANT
GOWN STRL REUS W/TWL LRG LVL3 (GOWN DISPOSABLE)
GOWN STRL REUS W/TWL XL LVL3 (GOWN DISPOSABLE) ×1
HEMOSTAT POWDER SURGIFOAM 1G (HEMOSTASIS) ×2 IMPLANT
KIT BASIN OR (CUSTOM PROCEDURE TRAY) ×2 IMPLANT
KIT ROOM TURNOVER OR (KITS) ×2 IMPLANT
NEEDLE HYPO 21X1.5 SAFETY (NEEDLE) ×2 IMPLANT
NEEDLE HYPO 22GX1.5 SAFETY (NEEDLE) ×2 IMPLANT
NS IRRIG 1000ML POUR BTL (IV SOLUTION) ×2 IMPLANT
PACK LAMINECTOMY NEURO (CUSTOM PROCEDURE TRAY) ×2 IMPLANT
PAD ARMBOARD 7.5X6 YLW CONV (MISCELLANEOUS) ×6 IMPLANT
PATTIES SURGICAL .5 X1 (DISPOSABLE) IMPLANT
RUBBERBAND STERILE (MISCELLANEOUS) ×4 IMPLANT
SPONGE SURGIFOAM ABS GEL 100 (HEMOSTASIS) ×2 IMPLANT
SPONGE SURGIFOAM ABS GEL SZ50 (HEMOSTASIS) ×2 IMPLANT
STRIP CLOSURE SKIN 1/2X4 (GAUZE/BANDAGES/DRESSINGS) ×2 IMPLANT
SUT BONE WAX W31G (SUTURE) ×2 IMPLANT
SUT VIC AB 1 CT1 18XBRD ANBCTR (SUTURE) ×2 IMPLANT
SUT VIC AB 1 CT1 8-18 (SUTURE) ×2
SUT VIC AB 2-0 CP2 18 (SUTURE) ×4 IMPLANT
SYR 20CC LL (SYRINGE) ×2 IMPLANT
TAPE CLOTH SURG 4X10 WHT LF (GAUZE/BANDAGES/DRESSINGS) ×2 IMPLANT
TOWEL OR 17X24 6PK STRL BLUE (TOWEL DISPOSABLE) ×2 IMPLANT
TOWEL OR 17X26 10 PK STRL BLUE (TOWEL DISPOSABLE) ×2 IMPLANT
WATER STERILE IRR 1000ML POUR (IV SOLUTION) ×2 IMPLANT

## 2015-10-20 NOTE — Anesthesia Procedure Notes (Addendum)
Procedure Name: Intubation Date/Time: 10/20/2015 10:50 AM Performed by: Jacquiline Doe A Pre-anesthesia Checklist: Patient identified, Emergency Drugs available, Suction available and Patient being monitored Patient Re-evaluated:Patient Re-evaluated prior to inductionOxygen Delivery Method: Circle System Utilized Preoxygenation: Pre-oxygenation with 100% oxygen Intubation Type: IV induction Ventilation: Mask ventilation without difficulty Laryngoscope Size: Glidescope, 4 and Mac Grade View: Grade I Tube type: Oral Tube size: 7.5 mm Number of attempts: 1 Airway Equipment and Method: Rigid stylet and Video-laryngoscopy Placement Confirmation: ETT inserted through vocal cords under direct vision,  positive ETCO2 and breath sounds checked- equal and bilateral Secured at: 23 cm Tube secured with: Tape Dental Injury: Teeth and Oropharynx as per pre-operative assessment  Difficulty Due To: Difficulty was anticipated Comments: Head and neck maintained neutral throughout intubation. Intubation performed by Verdia Kuba, SRNA

## 2015-10-20 NOTE — Op Note (Signed)
Brief history: The patient is a 60 year old white male who has complained of back, buttock, and leg pain consistent with neurogenic claudication. He has failed medical management and was worked up with a lumbar MRI which demonstrated severe spinal stenosis at L2-3, L3-4 and L4-5. I discussed the various treatment options with the patient. He has weighed the risks, benefits, and alternatives to surgery and decided to proceed with a L2-3, L3-4 and L4-5 laminectomy  Preoperative diagnosis: L2-3, L3-4 and L4-5 spinal stenosis, lumbago, lumbar radiculopathy, neurogenic claudication  Postoperative diagnosis: The same  Procedure: L2-3, L3-4 and L4-5 laminectomy/foraminotomy  to decompress the bilateral L2, L3, L4 and L5 nerve roots using microdissection  Surgeon: Dr. Earle Gell  Asst.: Dr. Sherley Bounds  Anesthesia: Gen. endotracheal  Estimated blood loss: 150 mL  Drains: One medium Hemovac  Complications: None  Description of procedure: The patient was brought to the operating room by the anesthesia team. General endotracheal anesthesia was induced. The patient was turned to the prone position on the Wilson frame. The patient's lumbosacral region was then prepared with Betadine scrub and Betadine solution. Sterile drapes were applied.  I then injected the area to be incised with Marcaine with epinephrine solution. I then used a scalpel to make a linear midline incision over the L2-3, L3-4 and L4-5 intervertebral disc space. I then used electrocautery to perform a bilateral subperiosteal dissection exposing the spinous process and lamina of L2, L3, L4. We obtained intraoperative radiograph to confirm our location. I then inserted the [facility] retractor for exposure. I began the decompression by incising the interspinous ligament at L1-2, L2-3, L3-4 and L4-5. I used the Leksell rongeur to remove the spinous process at L2, L3 and L4.  We then brought the operative microscope into the field. Under  its magnification and illumination we completed the microdissection. I used a high-speed drill to perform bilateral laminotomies at L2-3, L3-4 and L4-5. I then used a Kerrison punches to complete the laminectomy at L2, L3 and L4. We used the Kerrison punches to remove the ligamentum flavum at L1-2, L2-3, L3-4 and L4-5. We removed the cephalad aspect of the L5 lamina as well, using the Kerrison punches. We then used microdissection to free up the thecal sac and the bilateral L2, L3, L4 and L5 nerve root from the epidural tissue. I then used a Kerrison punch to perform a foraminotomy at about the bilateral L2, L3, L4 and L5 nerve root. We inspected the intervertebral discs at L2-3, L3-4 and L4-5 bilaterally. There were no significant herniations.   I then palpated along the ventral surface of the thecal sac and along exit route of the bilateral L2, L3, L4 and L5 nerve root and noted that the neural structures were well decompressed. This completed the decompression.  We then obtained hemostasis using bipolar electrocautery. We irrigated the wound out with bacitracin solution. We then removed the retractor. We placed a medium Hemovac drain in the epidural space and tunneled it out through a separate stab wound. We then reapproximated the patient's thoracolumbar fascia with interrupted #1 Vicryl suture. We then reapproximated the patient's subcutaneous tissue with interrupted 2-0 Vicryl suture. We then reapproximated patient's skin with Steri-Strips and benzoin. The was then coated with bacitracin ointment. The drapes were removed. The patient was subsequently returned to the supine position where they were extubated by the anesthesia team. The patient was then transported to the postanesthesia care unit in stable condition. All sponge instrument and needle counts were reportedly correct at the end  of this case.

## 2015-10-20 NOTE — Transfer of Care (Signed)
Immediate Anesthesia Transfer of Care Note  Patient: Darren Harrison  Procedure(s) Performed: Procedure(s): Lumbar two three-Lumbar three-four ,Lumbar four-five  LAMINECTOMY AND FORAMINOTOMY (N/A)  Patient Location: PACU  Anesthesia Type:General  Level of Consciousness: awake, oriented, sedated, patient cooperative and responds to stimulation  Airway & Oxygen Therapy: Patient Spontanous Breathing and Patient connected to nasal cannula oxygen  Post-op Assessment: Report given to RN, Post -op Vital signs reviewed and stable, Patient moving all extremities and Patient moving all extremities X 4  Post vital signs: Reviewed and stable  Last Vitals:  Vitals:   10/20/15 0836  BP: (!) 171/96  Pulse: 64  Resp: 20  Temp: 36.6 C    Last Pain:  Vitals:   10/20/15 0857  TempSrc:   PainSc: 2       Patients Stated Pain Goal: 1 ([phone] [id number])  Complications: No apparent anesthesia complications

## 2015-10-20 NOTE — Progress Notes (Signed)
Subjective:  The patient is alert and pleasant. He is in no apparent distress. He looks well.  Objective: Vital signs in last 24 hours: Temp:  [97.9 F (36.6 C)-98.2 F (36.8 C)] (P) 98.2 F (36.8 C) (09/07 1335) Pulse Rate:  [64-93] (P) 93 (09/07 1335) Resp:  [20] 20 (09/07 0836) BP: (171)/(96) 171/96 (09/07 0836) SpO2:  [96 %] 96 % (09/07 0836) Weight:  [124.3 kg (274 lb)] 124.3 kg (274 lb) (09/07 0836)  Intake/Output from previous day: No intake/output data recorded. Intake/Output this shift: Total I/O In: 1300 [I.V.:1300] Out: 250 [Blood:250]  Physical exam the patient is alert and pleasant. He is moving his lower extremities well.  Lab Results: No results for input(s): WBC, HGB, HCT, PLT in the last 72 hours. BMET No results for input(s): NA, K, CL, CO2, GLUCOSE, BUN, CREATININE, CALCIUM in the last 72 hours.  Studies/Results: No results found.  Assessment/Plan: The patient is doing well.  LOS: 0 days     Savior Himebaugh D 10/20/2015, 1:45 PM
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
## 2015-10-20 NOTE — Anesthesia Preprocedure Evaluation (Signed)
Anesthesia Evaluation  Patient identified by MRN, date of birth, ID band Patient awake    Reviewed: Allergy & Precautions, NPO status , Patient's Chart, lab work & pertinent test results  History of Anesthesia Complications Negative for: history of anesthetic complications  Airway Mallampati: II  TM Distance: >3 FB Neck ROM: Limited    Dental  (+) Poor Dentition, Missing   Pulmonary neg pulmonary ROS,    breath sounds clear to auscultation       Cardiovascular hypertension, Pt. on medications  Rhythm:Regular     Neuro/Psych  Neuromuscular disease negative psych ROS   GI/Hepatic Neg liver ROS, GERD  Controlled,  Endo/Other  negative endocrine ROS  Renal/GU negative Renal ROS     Musculoskeletal  (+) Arthritis ,   Abdominal   Peds  Hematology negative hematology ROS (+)   Anesthesia Other Findings   Reproductive/Obstetrics                             Anesthesia Physical Anesthesia Plan  ASA: II  Anesthesia Plan: General   Post-op Pain Management:    Induction: Intravenous  Airway Management Planned: Oral ETT  Additional Equipment: None  Intra-op Plan:   Post-operative Plan: Extubation in OR  Informed Consent: I have reviewed the patients History and Physical, chart, labs and discussed the procedure including the risks, benefits and alternatives for the proposed anesthesia with the patient or authorized representative who has indicated his/her understanding and acceptance.   Dental advisory given  Plan Discussed with: CRNA and Surgeon  Anesthesia Plan Comments:         Anesthesia Quick Evaluation

## 2015-10-21 ENCOUNTER — Encounter (HOSPITAL_COMMUNITY): Payer: Self-pay | Admitting: Neurosurgery

## 2015-10-21 DIAGNOSIS — M4806 Spinal stenosis, lumbar region: Secondary | ICD-10-CM | POA: Diagnosis not present

## 2015-10-21 MED ORDER — CYCLOBENZAPRINE HCL 10 MG PO TABS: 10.0000 mg | ORAL_TABLET | Freq: Three times a day (TID) | ORAL | 1 refills | Status: DC | PRN

## 2015-10-21 MED ORDER — DOCUSATE SODIUM 100 MG PO CAPS: 100.0000 mg | ORAL_CAPSULE | Freq: Two times a day (BID) | ORAL | 0 refills | Status: DC

## 2015-10-21 MED ORDER — OXYCODONE-ACETAMINOPHEN 10-325 MG PO TABS: 1.0000 | ORAL_TABLET | ORAL | 0 refills | Status: DC | PRN

## 2015-10-21 NOTE — Anesthesia Postprocedure Evaluation (Signed)
Anesthesia Post Note  Patient: [name]  Procedure(s) Performed: Procedure(s) (LRB): Lumbar two three-Lumbar three-four ,Lumbar four-five  LAMINECTOMY AND FORAMINOTOMY (N/A)  Patient location during evaluation: PACU Anesthesia Type: General Level of consciousness: awake Pain management: pain level controlled Vital Signs Assessment: post-procedure vital signs reviewed and stable Respiratory status: spontaneous breathing Cardiovascular status: stable Postop Assessment: no signs of nausea or vomiting Anesthetic complications: no    Last Vitals:  Vitals:   10/21/15 0419 10/21/15 0826  BP: 128/74 135/81  Pulse: 60 68  Resp: (!) 22 20  Temp: 36.8 C 36.7 C    Last Pain:  Vitals:   10/21/15 1059  TempSrc:   PainSc: 6                  Vandana Haman

## 2015-10-21 NOTE — Progress Notes (Signed)
Discharge instructions reviewed with patient/family. All questions answered at this time. RX given. Transport home by family.   Kessie Croston, RN 

## 2015-10-21 NOTE — Evaluation (Signed)
Physical Therapy Evaluation Patient Details Name: Darren Harrison MRN: [id number] DOB: 1955-08-05 Today's Date: 10/21/2015   History of Present Illness  patient is s/p L2-3, L3-4 and L4-5 laminectomy on the patient on 10/20/2015  Clinical Impression  Patient seen for education and mobility s/p spinal surgery. Patient mobilizing well, education complete, no further acute needs. Will sign off.    Follow Up Recommendations No PT follow up    Equipment Recommendations  None recommended by PT    Recommendations for Other Services       Precautions / Restrictions Precautions Precautions: Back Precaution Booklet Issued: Yes (comment) Precaution Comments: reviewed with patient Restrictions Weight Bearing Restrictions: No      Mobility  Bed Mobility Overal bed mobility: Needs Assistance Bed Mobility: Sidelying to Sit   Sidelying to sit: Supervision       General bed mobility comments: VCs for technique, increased time to perform  Transfers Overall transfer level: Needs assistance Equipment used: Rolling walker (2 wheeled) Transfers: Sit to/from Stand Sit to Stand: Supervision         General transfer comment: Supervision for safety  Ambulation/Gait Ambulation/Gait assistance: Supervision Ambulation Distance (Feet): 210 Feet Assistive device: Rolling walker (2 wheeled) Gait Pattern/deviations: Step-through pattern;Decreased stride length;Trunk flexed;Wide base of support Gait velocity: decreased Gait velocity interpretation: Below normal speed for age/gender General Gait Details: slow guarded gait but no significant instability ntoed  Stairs            Wheelchair Mobility    Modified Rankin (Stroke Patients Only)       Balance Overall balance assessment: No apparent balance deficits (not formally assessed)                                           Pertinent Vitals/Pain Pain Assessment: 0-10 Pain Score: 6  Pain Location:  back Pain Descriptors / Indicators: Sore Pain Intervention(s): Monitored during session    Home Living Family/patient expects to be discharged to:: Private residence Living Arrangements: Spouse/significant other Available Help at Discharge: Family;Available PRN/intermittently Type of Home: House Home Access: Level entry     Home Layout: Two level;Able to live on main level with bedroom/bathroom Home Equipment: Walker - standard;Shower seat;Grab bars - toilet;Wheelchair - Banker      Prior Function Level of Independence: Independent               Hand Dominance   Dominant Hand: Right    Extremity/Trunk Assessment   Upper Extremity Assessment: Overall WFL for tasks assessed           Lower Extremity Assessment: Generalized weakness      Cervical / Trunk Assessment:  (s/p spinal surgery )  Communication   Communication: No difficulties  Cognition Arousal/Alertness: Awake/alert Behavior During Therapy: WFL for tasks assessed/performed Overall Cognitive Status: Within Functional Limits for tasks assessed                      General Comments      Exercises        Assessment/Plan    PT Assessment Patent does not need any further PT services  PT Diagnosis Difficulty walking;Acute pain   PT Problem List    PT Treatment Interventions     PT Goals (Current goals can be found in the Care Plan section) Acute Rehab PT Goals PT Goal Formulation: All assessment and education  complete, DC therapy    Frequency     Barriers to discharge        Co-evaluation               End of Session   Activity Tolerance: Patient tolerated treatment well Patient left: with family/visitor present;Other (comment) (in bathroom) Nurse Communication: Mobility status;Precautions    Functional Assessment Tool Used: clinical Judgement Functional Limitation: Mobility: Walking and moving around Mobility: Walking and Moving Around Current Status  [id number]): At least 1 percent but less than 20 percent impaired, limited or restricted Mobility: Walking and Moving Around Goal Status [phone]): At least 1 percent but less than 20 percent impaired, limited or restricted Mobility: Walking and Moving Around Discharge Status [phone]): At least 1 percent but less than 20 percent impaired, limited or restricted    Time: 0744-0801 PT Time Calculation (min) (ACUTE ONLY): 17 min   Charges:   PT Evaluation $PT Eval Low Complexity: 1 Procedure     PT G Codes:   PT G-Codes **NOT FOR INPATIENT CLASS** Functional Assessment Tool Used: clinical Judgement Functional Limitation: Mobility: Walking and moving around Mobility: Walking and Moving Around Current Status [id number]): At least 1 percent but less than 20 percent impaired, limited or restricted Mobility: Walking and Moving Around Goal Status [phone]): At least 1 percent but less than 20 percent impaired, limited or restricted Mobility: Walking and Moving Around Discharge Status [phone]): At least 1 percent but less than 20 percent impaired, limited or restricted    Duncan Dull 10/21/2015, 9:19 AM Alben Deeds, PT DPT  [phone]

## 2015-10-21 NOTE — Discharge Summary (Signed)
  Physician Discharge Summary  Patient ID: Darren Harrison MRN: [id number] DOB/AGE: 1955/10/01 60 y.o.  Admit date: 10/20/2015 Discharge date: 10/21/2015  Admission Diagnoses:L2-3, L3-4 and L4-5 spinal stenosis, lumbago, lumbar radiculopathy, neurogenic claudication  Discharge Diagnoses: Same Active Problems:   Lumbar stenosis with neurogenic claudication   Discharged Condition: good  Hospital Course: I performed an L2-3, L3-4 and L4-5 laminectomy on the patient on 10/20/2015. The surgery went well.  The patient's postoperative course was unremarkable. On postoperative day #1 the patient requested discharge to home. The patient, and his son, were given written and oral discharge instructions. Their questions were answered.  Consults: Physical therapy Significant Diagnostic Studies: None Treatments: L2-3, L3-4 and L4-5 laminectomy using microdissection Discharge Exam: Blood pressure 128/74, pulse 60, temperature 98.3 F (36.8 C), temperature source Oral, resp. rate (!) 22, height 5\' 10"  (1.778 m), weight 124.3 kg (274 lb), SpO2 97 %. The patient is alert and pleasant. His strength is normal in his lower extremities. He looks well.  Disposition: Home  Discharge Instructions    Call MD for:  difficulty breathing, headache or visual disturbances    Complete by:  As directed   Call MD for:  extreme fatigue    Complete by:  As directed   Call MD for:  hives    Complete by:  As directed   Call MD for:  persistant dizziness or light-headedness    Complete by:  As directed   Call MD for:  persistant nausea and vomiting    Complete by:  As directed   Call MD for:  redness, tenderness, or signs of infection (pain, swelling, redness, odor or green/yellow discharge around incision site)    Complete by:  As directed   Call MD for:  severe uncontrolled pain    Complete by:  As directed   Call MD for:  temperature >100.4    Complete by:  As directed   Diet - low sodium heart healthy    Complete  by:  As directed   Discharge instructions    Complete by:  As directed   Call [phone] for a followup appointment. Take a stool softener while you are using pain medications.   Driving Restrictions    Complete by:  As directed   Do not drive for 2 weeks.   Increase activity slowly    Complete by:  As directed   Lifting restrictions    Complete by:  As directed   Do not lift more than 5 pounds. No excessive bending or twisting.   May shower / Bathe    Complete by:  As directed   He may shower after the pain she is removed 3 days after surgery. Leave the incision alone.   Remove dressing in 48 hours    Complete by:  As directed   Your stitches are under the scan and will dissolve by themselves. The Steri-Strips will fall off after you take a few showers. Do not rub back or pick at the wound, Leave the wound alone.        SignedOphelia Charter 10/21/2015, 7:46 AM

## 2020-01-13 ENCOUNTER — Other Ambulatory Visit: Payer: Self-pay | Admitting: Nurse Practitioner

## 2020-01-13 DIAGNOSIS — R748 Abnormal levels of other serum enzymes: Secondary | ICD-10-CM

## 2020-01-13 DIAGNOSIS — K7460 Unspecified cirrhosis of liver: Secondary | ICD-10-CM

## 2020-01-18 ENCOUNTER — Encounter: Payer: Self-pay | Admitting: Internal Medicine

## 2020-01-21 ENCOUNTER — Other Ambulatory Visit: Payer: Managed Care, Other (non HMO)

## 2020-01-28 LAB — TSH: TSH: 0 — AB (ref 0.41–5.90)

## 2020-02-13 HISTORY — PX: COLONOSCOPY: SHX174

## 2020-02-17 ENCOUNTER — Ambulatory Visit
Admission: RE | Admit: 2020-02-17 | Discharge: 2020-02-17 | Disposition: A | Discharge status: Home / Self Care | Payer: Managed Care, Other (non HMO) | Source: Ambulatory Visit | Attending: Nurse Practitioner | Admitting: Nurse Practitioner

## 2020-02-17 DIAGNOSIS — K7460 Unspecified cirrhosis of liver: Secondary | ICD-10-CM

## 2020-02-17 DIAGNOSIS — R748 Abnormal levels of other serum enzymes: Secondary | ICD-10-CM

## 2020-02-22 ENCOUNTER — Ambulatory Visit: Payer: Managed Care, Other (non HMO) | Admitting: "Endocrinology

## 2020-03-03 ENCOUNTER — Encounter: Payer: Self-pay | Admitting: "Endocrinology

## 2020-03-03 ENCOUNTER — Other Ambulatory Visit: Payer: Self-pay

## 2020-03-03 ENCOUNTER — Ambulatory Visit (INDEPENDENT_AMBULATORY_CARE_PROVIDER_SITE_OTHER): Payer: 59 | Admitting: "Endocrinology

## 2020-03-03 VITALS — BP 110/78 | HR 60 | Ht 70.0 in | Wt 173.4 lb

## 2020-03-03 DIAGNOSIS — E0590 Thyrotoxicosis, unspecified without thyrotoxic crisis or storm: Secondary | ICD-10-CM | POA: Diagnosis not present
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
## 2020-03-04 ENCOUNTER — Telehealth: Payer: Self-pay | Admitting: "Endocrinology

## 2020-03-04 LAB — T4, FREE: Free T4: 2.12 ng/dL — ABNORMAL HIGH (ref 0.82–1.77)

## 2020-03-04 LAB — THYROID PEROXIDASE ANTIBODY: Thyroperoxidase Ab SerPl-aCnc: <8 IU/mL (ref 0–34)

## 2020-03-04 LAB — TSH: TSH: <0.005 u[IU]/mL — ABNORMAL LOW (ref 0.450–4.500)

## 2020-03-04 LAB — T3, FREE: T3, Free: 9.1 pg/mL — ABNORMAL HIGH (ref 2.0–4.4)

## 2020-03-04 LAB — THYROGLOBULIN ANTIBODY: Thyroglobulin Antibody: 1.9 IU/mL — ABNORMAL HIGH (ref 0.0–0.9)

## 2020-03-04 NOTE — Telephone Encounter (Signed)
Ryan at [facility] is requesting call back in regards to pt   [phone]

## 2020-03-04 NOTE — Telephone Encounter (Signed)
Darren Harrison with [facility] stated pt had a CT scan on 02/24/20 and would need to wait six weeks before he could have his thyroid uptake and scan. Discussed with Dr.Nida. Pt rescheduled for uptake and scan on February 28th and March 1st. Discussed with pt the need to change his scan dates, also advised pt to start taking methimazole 5mg  daily and stop it seven days before his scan per Dr.Nida's orders. Understanding voiced per pt.

## 2020-03-09 ENCOUNTER — Ambulatory Visit (INDEPENDENT_AMBULATORY_CARE_PROVIDER_SITE_OTHER): Payer: Managed Care, Other (non HMO) | Admitting: Internal Medicine

## 2020-03-09 ENCOUNTER — Encounter: Payer: Self-pay | Admitting: Internal Medicine

## 2020-03-09 ENCOUNTER — Other Ambulatory Visit: Payer: Self-pay

## 2020-03-09 ENCOUNTER — Encounter: Payer: Self-pay | Admitting: *Deleted

## 2020-03-09 DIAGNOSIS — R188 Other ascites: Secondary | ICD-10-CM | POA: Diagnosis not present

## 2020-03-09 DIAGNOSIS — K766 Portal hypertension: Secondary | ICD-10-CM | POA: Diagnosis not present

## 2020-03-09 DIAGNOSIS — K7460 Unspecified cirrhosis of liver: Secondary | ICD-10-CM

## 2020-03-09 NOTE — Patient Instructions (Signed)
We will schedule you for EGD for variceal screening. Further recommendations to follow.   Recommend MRI to further evaluate liver lesion seen on ultrasound. If you have any issues scheduling this, please give [place] a call.  Follow-up with [facility] as previously scheduled.  At [facility] we value your feedback. You may receive a survey about your visit today. Please share your experience as we strive to create trusting relationships with our patients to provide genuine, compassionate, quality care.  We appreciate your understanding and patience as we review any laboratory studies, imaging, and other diagnostic tests that are ordered as we care for you. Our office policy is 5 business days for review of these results, and any emergent or urgent results are addressed in a timely manner for your best interest. If you do not hear from our office in 1 week, please contact us.   We also encourage the use of MyChart, which contains your medical information for your review as well. If you are not enrolled in this feature, an access code is on this after visit summary for your convenience. Thank you for allowing [place] to be involved in your care.  It was great to see you today!  I hope you have a great rest of your winter!!    Elon Alas. Abbey Chatters, D.O. Gastroenterology and Hepatology [facility]
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
## 2020-03-11 ENCOUNTER — Encounter: Payer: Self-pay | Admitting: *Deleted

## 2020-03-17 ENCOUNTER — Ambulatory Visit: Payer: 59 | Admitting: "Endocrinology

## 2020-03-21 ENCOUNTER — Other Ambulatory Visit (HOSPITAL_COMMUNITY): Payer: Self-pay | Admitting: Nurse Practitioner

## 2020-03-21 ENCOUNTER — Other Ambulatory Visit: Payer: Self-pay | Admitting: Nurse Practitioner

## 2020-03-21 DIAGNOSIS — Z8719 Personal history of other diseases of the digestive system: Secondary | ICD-10-CM

## 2020-04-11 ENCOUNTER — Other Ambulatory Visit: Payer: Self-pay

## 2020-04-11 ENCOUNTER — Encounter (HOSPITAL_COMMUNITY)
Admission: RE | Admit: 2020-04-11 | Discharge: 2020-04-11 | Disposition: A | Discharge status: Home / Self Care | Payer: Managed Care, Other (non HMO) | Source: Ambulatory Visit | Attending: "Endocrinology | Admitting: "Endocrinology

## 2020-04-11 DIAGNOSIS — E0590 Thyrotoxicosis, unspecified without thyrotoxic crisis or storm: Secondary | ICD-10-CM | POA: Insufficient documentation

## 2020-04-11 MED ORDER — SODIUM IODIDE I-123 7.4 MBQ CAPS
316.0000 | ORAL_CAPSULE | Freq: Once | ORAL | Status: AC
  Administered 2020-04-11: 316 via ORAL

## 2020-04-12 ENCOUNTER — Encounter (HOSPITAL_COMMUNITY)
Admission: RE | Admit: 2020-04-12 | Discharge: 2020-04-12 | Disposition: A | Discharge status: Home / Self Care | Payer: Managed Care, Other (non HMO) | Source: Ambulatory Visit | Attending: "Endocrinology | Admitting: "Endocrinology

## 2020-04-14 ENCOUNTER — Ambulatory Visit (INDEPENDENT_AMBULATORY_CARE_PROVIDER_SITE_OTHER): Payer: 59 | Admitting: "Endocrinology

## 2020-04-14 ENCOUNTER — Other Ambulatory Visit: Payer: Self-pay

## 2020-04-14 ENCOUNTER — Ambulatory Visit: Payer: 59 | Admitting: "Endocrinology

## 2020-04-14 ENCOUNTER — Encounter: Payer: Self-pay | Admitting: "Endocrinology

## 2020-04-14 ENCOUNTER — Ambulatory Visit (HOSPITAL_COMMUNITY)
Admission: RE | Admit: 2020-04-14 | Discharge: 2020-04-14 | Disposition: A | Discharge status: Home / Self Care | Payer: Managed Care, Other (non HMO) | Source: Ambulatory Visit | Attending: Nurse Practitioner | Admitting: Nurse Practitioner

## 2020-04-14 VITALS — BP 114/80 | HR 76 | Ht 70.0 in | Wt 172.8 lb

## 2020-04-14 DIAGNOSIS — E0590 Thyrotoxicosis, unspecified without thyrotoxic crisis or storm: Secondary | ICD-10-CM | POA: Diagnosis not present

## 2020-04-14 DIAGNOSIS — E0500 Thyrotoxicosis with diffuse goiter without thyrotoxic crisis or storm: Secondary | ICD-10-CM | POA: Insufficient documentation

## 2020-04-14 DIAGNOSIS — Z8719 Personal history of other diseases of the digestive system: Secondary | ICD-10-CM

## 2020-04-14 MED ORDER — GADOBUTROL 1 MMOL/ML IV SOLN
7.0000 mL | Freq: Once | INTRAVENOUS | Status: AC | PRN
  Administered 2020-04-14: 7 mL via INTRAVENOUS

## 2020-04-14 MED ORDER — PREDNISONE 10 MG PO TABS: 10.0000 mg | ORAL_TABLET | Freq: Every day | ORAL | 0 refills | Status: DC
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
## 2020-04-18 ENCOUNTER — Telehealth: Payer: Self-pay

## 2020-04-18 ENCOUNTER — Telehealth: Payer: Self-pay | Admitting: Internal Medicine

## 2020-04-18 NOTE — Telephone Encounter (Signed)
Pt called office and LMOVM, requested to cancel procedure that was scheduled for 04/26/20. He needs to have another procedure done 1st. Called pt back and left voicemail to inform him message had been received and call office when he's able to proceed with procedure. Endo scheduler informed.

## 2020-04-18 NOTE — Telephone Encounter (Signed)
Pt LMOM Friday that he needed to reschedule his procedure. [phone]

## 2020-04-18 NOTE — Telephone Encounter (Signed)
See other phone note

## 2020-04-22 ENCOUNTER — Other Ambulatory Visit (HOSPITAL_COMMUNITY): Payer: Managed Care, Other (non HMO)

## 2020-04-25 ENCOUNTER — Telehealth: Payer: Self-pay | Admitting: "Endocrinology

## 2020-04-25 ENCOUNTER — Telehealth: Payer: Self-pay

## 2020-04-25 NOTE — Telephone Encounter (Signed)
Pt is calling and states tomorrow is his last pill of prednisone and since the scan was canceled he is not sure if he needs another rx for this or if he should be taking something else now.

## 2020-04-25 NOTE — Telephone Encounter (Signed)
Left a message requesting pt return a call to the office. 

## 2020-04-25 NOTE — Telephone Encounter (Signed)
Patient left a VM that he received a letter from our office in regards to a scan. He has some questions about this.

## 2020-04-26 ENCOUNTER — Other Ambulatory Visit: Payer: Self-pay | Admitting: "Endocrinology

## 2020-04-26 ENCOUNTER — Encounter (HOSPITAL_COMMUNITY): Payer: Self-pay

## 2020-04-26 ENCOUNTER — Encounter (HOSPITAL_COMMUNITY): Payer: Managed Care, Other (non HMO)

## 2020-04-26 ENCOUNTER — Encounter (HOSPITAL_COMMUNITY): Admission: RE | Payer: Self-pay | Source: Home / Self Care

## 2020-04-26 ENCOUNTER — Ambulatory Visit (HOSPITAL_COMMUNITY): Admission: RE | Admit: 2020-04-26 | Payer: Managed Care, Other (non HMO) | Source: Home / Self Care

## 2020-04-26 SURGERY — ESOPHAGOGASTRODUODENOSCOPY (EGD) WITH PROPOFOL
Anesthesia: Monitor Anesthesia Care

## 2020-04-26 MED ORDER — PREDNISONE 10 MG PO TABS: 10.0000 mg | ORAL_TABLET | Freq: Every day | ORAL | 0 refills | Status: DC

## 2020-04-26 NOTE — Telephone Encounter (Signed)
Talked with pt, he stated he took his last dose of prednisone this morning. We are waiting on approval notification for his treatment from [place]. Pt use [facility] [employer] in [place] at [facility].

## 2020-04-28 ENCOUNTER — Telehealth: Payer: Self-pay

## 2020-04-28 NOTE — Telephone Encounter (Signed)
Talked with pt, he is going to fax [place] the letter he received.

## 2020-04-28 NOTE — Telephone Encounter (Signed)
Noted  

## 2020-04-28 NOTE — Telephone Encounter (Signed)
Joy, patient left a VM stating that he received a letter in the mail from his insurance company stating they need more information for his [place]. He said they mailed [place] one too but the address was [facility], Which is Dr Josephine Cables office (where [place] used to be). He is asking for a call back

## 2020-04-28 NOTE — Telephone Encounter (Signed)
I sent a refill on his prednisone for 15 more days.

## 2020-05-09 NOTE — Telephone Encounter (Signed)
Pt returned call, please advise  

## 2020-05-09 NOTE — Telephone Encounter (Signed)
Pt said he received a letter stating he was approved for the medication and would like Joy to call him and help set that up

## 2020-05-09 NOTE — Telephone Encounter (Signed)
Left a message requesting a return call to the office. 

## 2020-05-09 NOTE — Telephone Encounter (Signed)
Talked with pt, he stated he received an authorization letter from [facility] for his [place] radiation therapy for hyperthyroidism. Authorization # [id number]. Will call [place] to schedule pt.

## 2020-05-12 ENCOUNTER — Encounter (HOSPITAL_COMMUNITY)
Admission: RE | Admit: 2020-05-12 | Discharge: 2020-05-12 | Disposition: A | Discharge status: Home / Self Care | Payer: Managed Care, Other (non HMO) | Source: Ambulatory Visit | Attending: "Endocrinology | Admitting: "Endocrinology

## 2020-05-12 DIAGNOSIS — E0590 Thyrotoxicosis, unspecified without thyrotoxic crisis or storm: Secondary | ICD-10-CM | POA: Diagnosis present

## 2020-05-12 DIAGNOSIS — E0500 Thyrotoxicosis with diffuse goiter without thyrotoxic crisis or storm: Secondary | ICD-10-CM | POA: Insufficient documentation

## 2020-05-12 MED ORDER — SODIUM IODIDE I 131 CAPSULE
15.0000 | Freq: Once | INTRAVENOUS | Status: AC | PRN
  Administered 2020-05-12: 14.3 via ORAL

## 2020-05-16 NOTE — Telephone Encounter (Signed)
Pt called asking if he needs to continue prednisone. He also asked for your opinion of taking OTC amino acids.

## 2020-05-16 NOTE — Telephone Encounter (Signed)
He can finish his supplies by taking 1 every other day and stop. We will talk about his diet next visit.

## 2020-05-16 NOTE — Telephone Encounter (Signed)
He can proceed with all suggested or prescribed diet, will not have any effect on his thyroid.

## 2020-05-16 NOTE — Telephone Encounter (Signed)
Left a message requesting a return call to the office. 

## 2020-05-16 NOTE — Telephone Encounter (Signed)
Pt stated with his hepatic cirrhosis it had been suggested to him to take amino acids.

## 2020-05-16 NOTE — Telephone Encounter (Signed)
Discussed with pt, understanding voiced. 

## 2020-05-16 NOTE — Telephone Encounter (Signed)
Pt called and said he had his treatment last week and is having questions. Requesting to speak with joy.

## 2020-06-06 ENCOUNTER — Telehealth: Payer: Self-pay

## 2020-06-06 DIAGNOSIS — E0590 Thyrotoxicosis, unspecified without thyrotoxic crisis or storm: Secondary | ICD-10-CM

## 2020-06-06 NOTE — Telephone Encounter (Signed)
Pt is scheduled back to see you toward the end of May, he is having a lot of trouble with his knee and is in need of a knee replacement asap. Pt asked if with his next visit would he possibly be able to be cleared for surgery. States he is now starting to have frequent falls due to his knee.

## 2020-06-06 NOTE — Telephone Encounter (Signed)
Discussed with pt, understanding voiced. 

## 2020-06-06 NOTE — Telephone Encounter (Signed)
Pt is requesting you to call him back

## 2020-06-06 NOTE — Telephone Encounter (Signed)
Most likely he will .We will know  better after we see his lab work before his visit.

## 2020-06-15 ENCOUNTER — Telehealth: Payer: Self-pay | Admitting: "Endocrinology

## 2020-06-15 NOTE — Telephone Encounter (Signed)
Pt states he's experiencing "severe" leg cramps, numbness in bilateral hands and hips for the past 3 days.

## 2020-06-15 NOTE — Telephone Encounter (Signed)
Pt is calling and requesting to speak with Joy about cramping in his legs and would like to know if its something he can take for this

## 2020-06-16 ENCOUNTER — Ambulatory Visit: Payer: 59 | Admitting: "Endocrinology

## 2020-06-16 ENCOUNTER — Other Ambulatory Visit: Payer: Self-pay | Admitting: "Endocrinology

## 2020-06-16 DIAGNOSIS — E0590 Thyrotoxicosis, unspecified without thyrotoxic crisis or storm: Secondary | ICD-10-CM

## 2020-06-16 DIAGNOSIS — E0500 Thyrotoxicosis with diffuse goiter without thyrotoxic crisis or storm: Secondary | ICD-10-CM

## 2020-06-16 NOTE — Telephone Encounter (Signed)
Discussed with pt, he stated he would have blood drawn asap and will call the office back once he spoke with his wife to arrange a day to come in.

## 2020-06-16 NOTE — Telephone Encounter (Signed)
I suggest he does his labs sooner and move up his appointment. I ordered those labs.

## 2020-06-16 NOTE — Telephone Encounter (Signed)
Left a message requesting a return call to the office. 

## 2020-06-17 LAB — TSH: TSH: <0.005 u[IU]/mL — ABNORMAL LOW (ref 0.450–4.500)

## 2020-06-17 LAB — T4, FREE: Free T4: 1.76 ng/dL (ref 0.82–1.77)

## 2020-06-17 LAB — T3, FREE: T3, Free: 3.3 pg/mL (ref 2.0–4.4)

## 2020-06-17 NOTE — Telephone Encounter (Signed)
Darren Harrison- pt wants you to call him

## 2020-06-17 NOTE — Telephone Encounter (Signed)
Called pt, advised him to keep his upcoming appointment.

## 2020-06-21 ENCOUNTER — Other Ambulatory Visit: Payer: Self-pay

## 2020-06-21 ENCOUNTER — Encounter: Payer: Self-pay | Admitting: "Endocrinology

## 2020-06-21 ENCOUNTER — Ambulatory Visit (INDEPENDENT_AMBULATORY_CARE_PROVIDER_SITE_OTHER): Payer: 59 | Admitting: "Endocrinology

## 2020-06-21 VITALS — BP 104/72 | HR 76 | Ht 70.0 in

## 2020-06-21 DIAGNOSIS — E0500 Thyrotoxicosis with diffuse goiter without thyrotoxic crisis or storm: Secondary | ICD-10-CM

## 2020-06-21 MED ORDER — PREDNISONE 5 MG PO TABS: 5.0000 mg | ORAL_TABLET | Freq: Every day | ORAL | 0 refills | Status: DC
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
## 2020-06-28 ENCOUNTER — Inpatient Hospital Stay (HOSPITAL_COMMUNITY)
Admission: AD | Admit: 2020-06-28 | Discharge: 2020-07-05 | DRG: 471 | Disposition: A | Discharge status: Rehabilitation Facility | Payer: Managed Care, Other (non HMO) | Source: Ambulatory Visit | Attending: Neurosurgery | Admitting: Neurosurgery

## 2020-06-28 ENCOUNTER — Observation Stay (HOSPITAL_COMMUNITY): Payer: Managed Care, Other (non HMO)

## 2020-06-28 DIAGNOSIS — M4802 Spinal stenosis, cervical region: Secondary | ICD-10-CM | POA: Diagnosis present

## 2020-06-28 DIAGNOSIS — N400 Enlarged prostate without lower urinary tract symptoms: Secondary | ICD-10-CM | POA: Diagnosis present

## 2020-06-28 DIAGNOSIS — R296 Repeated falls: Secondary | ICD-10-CM | POA: Diagnosis present

## 2020-06-28 DIAGNOSIS — Z8349 Family history of other endocrine, nutritional and metabolic diseases: Secondary | ICD-10-CM

## 2020-06-28 DIAGNOSIS — Z993 Dependence on wheelchair: Secondary | ICD-10-CM

## 2020-06-28 DIAGNOSIS — Z87891 Personal history of nicotine dependence: Secondary | ICD-10-CM

## 2020-06-28 DIAGNOSIS — E0590 Thyrotoxicosis, unspecified without thyrotoxic crisis or storm: Secondary | ICD-10-CM | POA: Diagnosis present

## 2020-06-28 DIAGNOSIS — M170 Bilateral primary osteoarthritis of knee: Secondary | ICD-10-CM | POA: Diagnosis present

## 2020-06-28 DIAGNOSIS — K7460 Unspecified cirrhosis of liver: Secondary | ICD-10-CM | POA: Diagnosis present

## 2020-06-28 DIAGNOSIS — G8250 Quadriplegia, unspecified: Secondary | ICD-10-CM | POA: Diagnosis present

## 2020-06-28 DIAGNOSIS — G992 Myelopathy in diseases classified elsewhere: Secondary | ICD-10-CM | POA: Diagnosis present

## 2020-06-28 DIAGNOSIS — K592 Neurogenic bowel, not elsewhere classified: Secondary | ICD-10-CM | POA: Diagnosis present

## 2020-06-28 DIAGNOSIS — E039 Hypothyroidism, unspecified: Secondary | ICD-10-CM | POA: Diagnosis present

## 2020-06-28 DIAGNOSIS — I4891 Unspecified atrial fibrillation: Secondary | ICD-10-CM | POA: Diagnosis present

## 2020-06-28 DIAGNOSIS — K760 Fatty (change of) liver, not elsewhere classified: Secondary | ICD-10-CM | POA: Diagnosis present

## 2020-06-28 DIAGNOSIS — Z981 Arthrodesis status: Secondary | ICD-10-CM

## 2020-06-28 DIAGNOSIS — Z419 Encounter for procedure for purposes other than remedying health state, unspecified: Secondary | ICD-10-CM

## 2020-06-28 DIAGNOSIS — L8990 Pressure ulcer of unspecified site, unspecified stage: Secondary | ICD-10-CM | POA: Insufficient documentation

## 2020-06-28 DIAGNOSIS — M7138 Other bursal cyst, other site: Principal | ICD-10-CM | POA: Diagnosis present

## 2020-06-28 DIAGNOSIS — I10 Essential (primary) hypertension: Secondary | ICD-10-CM | POA: Diagnosis present

## 2020-06-28 DIAGNOSIS — L89893 Pressure ulcer of other site, stage 3: Secondary | ICD-10-CM | POA: Diagnosis present

## 2020-06-28 DIAGNOSIS — K219 Gastro-esophageal reflux disease without esophagitis: Secondary | ICD-10-CM | POA: Diagnosis present

## 2020-06-28 DIAGNOSIS — Z9181 History of falling: Secondary | ICD-10-CM

## 2020-06-28 DIAGNOSIS — Z20822 Contact with and (suspected) exposure to covid-19: Secondary | ICD-10-CM | POA: Diagnosis present

## 2020-06-28 DIAGNOSIS — Z8249 Family history of ischemic heart disease and other diseases of the circulatory system: Secondary | ICD-10-CM

## 2020-06-28 DIAGNOSIS — Z800 Family history of malignant neoplasm of digestive organs: Secondary | ICD-10-CM

## 2020-06-28 DIAGNOSIS — G959 Disease of spinal cord, unspecified: Secondary | ICD-10-CM | POA: Diagnosis present

## 2020-06-28 DIAGNOSIS — Z79899 Other long term (current) drug therapy: Secondary | ICD-10-CM

## 2020-06-28 DIAGNOSIS — Z7952 Long term (current) use of systemic steroids: Secondary | ICD-10-CM

## 2020-06-28 DIAGNOSIS — Z833 Family history of diabetes mellitus: Secondary | ICD-10-CM

## 2020-06-28 LAB — CBC
HCT: 36.3 % — ABNORMAL LOW (ref 39.0–52.0)
Hemoglobin: 11.8 g/dL — ABNORMAL LOW (ref 13.0–17.0)
MCH: 28.9 pg (ref 26.0–34.0)
MCHC: 32.5 g/dL (ref 30.0–36.0)
MCV: 89 fL (ref 80.0–100.0)
Platelets: 161 10*3/uL (ref 150–400)
RBC: 4.08 MIL/uL — ABNORMAL LOW (ref 4.22–5.81)
RDW: 16.3 % — ABNORMAL HIGH (ref 11.5–15.5)
WBC: 6.5 10*3/uL (ref 4.0–10.5)
nRBC: 0 % (ref 0.0–0.2)

## 2020-06-28 LAB — COMPREHENSIVE METABOLIC PANEL
ALT: 29 U/L (ref 0–44)
AST: 28 U/L (ref 15–41)
Albumin: 3.4 g/dL — ABNORMAL LOW (ref 3.5–5.0)
Alkaline Phosphatase: 143 U/L — ABNORMAL HIGH (ref 38–126)
Anion gap: 7 (ref 5–15)
BUN: 20 mg/dL (ref 8–23)
CO2: 24 mmol/L (ref 22–32)
Calcium: 9.1 mg/dL (ref 8.9–10.3)
Chloride: 106 mmol/L (ref 98–111)
Creatinine, Ser: 0.63 mg/dL (ref 0.61–1.24)
GFR, Estimated: >60 mL/min (ref >60)
Glucose, Bld: 87 mg/dL (ref 70–99)
Potassium: 4 mmol/L (ref 3.5–5.1)
Sodium: 137 mmol/L (ref 135–145)
Total Bilirubin: 0.9 mg/dL (ref 0.3–1.2)
Total Protein: 5.9 g/dL — ABNORMAL LOW (ref 6.5–8.1)

## 2020-06-28 LAB — HIV ANTIBODY (ROUTINE TESTING W REFLEX): HIV Screen 4th Generation wRfx: NONREACTIVE

## 2020-06-28 MED ORDER — DICYCLOMINE HCL 10 MG PO CAPS
10.0000 mg | ORAL_CAPSULE | Freq: Two times a day (BID) | ORAL | Status: DC | PRN
  Administered 2020-07-02 – 2020-07-03 (×3): 10 mg via ORAL
  Filled 2020-06-28 (×6): qty 1

## 2020-06-28 MED ORDER — ONDANSETRON HCL 4 MG/2ML IJ SOLN: 4.0000 mg | Freq: Four times a day (QID) | INTRAMUSCULAR | Status: DC | PRN

## 2020-06-28 MED ORDER — METOPROLOL TARTRATE 25 MG PO TABS
25.0000 mg | ORAL_TABLET | Freq: Every day | ORAL | Status: DC
  Administered 2020-06-29 – 2020-07-05 (×7): 25 mg via ORAL
  Filled 2020-06-28 (×7): qty 1

## 2020-06-28 MED ORDER — DOCUSATE SODIUM 100 MG PO CAPS
100.0000 mg | ORAL_CAPSULE | Freq: Two times a day (BID) | ORAL | Status: DC
  Administered 2020-06-28: 100 mg via ORAL
  Filled 2020-06-28: qty 1

## 2020-06-28 MED ORDER — SODIUM CHLORIDE 0.9% FLUSH: 3.0000 mL | INTRAVENOUS | Status: DC | PRN

## 2020-06-28 MED ORDER — LOSARTAN POTASSIUM 50 MG PO TABS
50.0000 mg | ORAL_TABLET | Freq: Every day | ORAL | Status: DC
  Administered 2020-06-30 – 2020-07-05 (×6): 50 mg via ORAL
  Filled 2020-06-28 (×6): qty 1

## 2020-06-28 MED ORDER — ZINC SULFATE 220 (50 ZN) MG PO CAPS
220.0000 mg | ORAL_CAPSULE | Freq: Every day | ORAL | Status: DC
  Administered 2020-06-30 – 2020-07-05 (×6): 220 mg via ORAL
  Filled 2020-06-28 (×6): qty 1

## 2020-06-28 MED ORDER — SODIUM CHLORIDE 0.9% FLUSH
3.0000 mL | Freq: Two times a day (BID) | INTRAVENOUS | Status: DC
  Administered 2020-06-28 – 2020-07-05 (×12): 3 mL via INTRAVENOUS

## 2020-06-28 MED ORDER — PREDNISONE 5 MG PO TABS
5.0000 mg | ORAL_TABLET | Freq: Every day | ORAL | Status: DC
  Administered 2020-06-30 – 2020-07-05 (×6): 5 mg via ORAL
  Filled 2020-06-28 (×6): qty 1

## 2020-06-28 MED ORDER — ADULT MULTIVITAMIN W/MINERALS CH
1.0000 | ORAL_TABLET | Freq: Every day | ORAL | Status: DC
  Administered 2020-06-30 – 2020-07-05 (×6): 1 via ORAL
  Filled 2020-06-28 (×6): qty 1

## 2020-06-28 MED ORDER — ACETAMINOPHEN 325 MG PO TABS: 650.0000 mg | ORAL_TABLET | Freq: Four times a day (QID) | ORAL | Status: DC | PRN

## 2020-06-28 MED ORDER — SODIUM CHLORIDE 0.9 % IV SOLN: 250.0000 mL | INTRAVENOUS | Status: DC | PRN

## 2020-06-28 MED ORDER — TAMSULOSIN HCL 0.4 MG PO CAPS
0.4000 mg | ORAL_CAPSULE | Freq: Every day | ORAL | Status: DC
  Administered 2020-06-30 – 2020-07-05 (×6): 0.4 mg via ORAL
  Filled 2020-06-28 (×6): qty 1

## 2020-06-28 MED ORDER — HYDROMORPHONE HCL 1 MG/ML IJ SOLN
0.5000 mg | INTRAMUSCULAR | Status: DC | PRN
  Administered 2020-06-28 – 2020-06-30 (×6): 1 mg via INTRAVENOUS
  Filled 2020-06-28 (×7): qty 1

## 2020-06-28 MED ORDER — OXYCODONE HCL 5 MG PO TABS: 5.0000 mg | ORAL_TABLET | ORAL | Status: DC | PRN

## 2020-06-28 MED ORDER — ONDANSETRON HCL 4 MG PO TABS: 4.0000 mg | ORAL_TABLET | Freq: Four times a day (QID) | ORAL | Status: DC | PRN

## 2020-06-28 MED ORDER — ACETAMINOPHEN 650 MG RE SUPP: 650.0000 mg | Freq: Four times a day (QID) | RECTAL | Status: DC | PRN
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
## 2020-06-28 NOTE — Plan of Care (Signed)
  Problem: Education: Goal: Knowledge of General Education information will improve Description: Including pain rating scale, medication(s)/side effects and non-pharmacologic comfort measures Outcome: Progressing   Problem: Health Behavior/Discharge Planning: Goal: Ability to manage health-related needs will improve Outcome: Progressing   Problem: Clinical Measurements: Goal: Will remain free from infection Outcome: Progressing   

## 2020-06-28 NOTE — Plan of Care (Signed)

## 2020-06-29 ENCOUNTER — Observation Stay (HOSPITAL_COMMUNITY): Payer: Managed Care, Other (non HMO)

## 2020-06-29 ENCOUNTER — Encounter (HOSPITAL_COMMUNITY): Payer: Self-pay | Admitting: Neurosurgery

## 2020-06-29 ENCOUNTER — Observation Stay (HOSPITAL_COMMUNITY): Payer: Managed Care, Other (non HMO) | Admitting: Certified Registered"

## 2020-06-29 ENCOUNTER — Encounter (HOSPITAL_COMMUNITY)
Admission: AD | Disposition: A | Discharge status: Rehabilitation Facility | Payer: Self-pay | Source: Ambulatory Visit | Attending: Neurosurgery

## 2020-06-29 DIAGNOSIS — M4802 Spinal stenosis, cervical region: Secondary | ICD-10-CM | POA: Diagnosis present

## 2020-06-29 DIAGNOSIS — K592 Neurogenic bowel, not elsewhere classified: Secondary | ICD-10-CM | POA: Diagnosis present

## 2020-06-29 DIAGNOSIS — G8250 Quadriplegia, unspecified: Secondary | ICD-10-CM | POA: Diagnosis present

## 2020-06-29 DIAGNOSIS — E039 Hypothyroidism, unspecified: Secondary | ICD-10-CM | POA: Diagnosis present

## 2020-06-29 DIAGNOSIS — Z20822 Contact with and (suspected) exposure to covid-19: Secondary | ICD-10-CM | POA: Diagnosis present

## 2020-06-29 DIAGNOSIS — Z87891 Personal history of nicotine dependence: Secondary | ICD-10-CM | POA: Diagnosis not present

## 2020-06-29 DIAGNOSIS — Z981 Arthrodesis status: Secondary | ICD-10-CM | POA: Diagnosis not present

## 2020-06-29 DIAGNOSIS — G992 Myelopathy in diseases classified elsewhere: Secondary | ICD-10-CM | POA: Diagnosis present

## 2020-06-29 DIAGNOSIS — I4891 Unspecified atrial fibrillation: Secondary | ICD-10-CM | POA: Diagnosis present

## 2020-06-29 DIAGNOSIS — Z8249 Family history of ischemic heart disease and other diseases of the circulatory system: Secondary | ICD-10-CM | POA: Diagnosis not present

## 2020-06-29 DIAGNOSIS — I10 Essential (primary) hypertension: Secondary | ICD-10-CM | POA: Diagnosis present

## 2020-06-29 DIAGNOSIS — R252 Cramp and spasm: Secondary | ICD-10-CM | POA: Diagnosis not present

## 2020-06-29 DIAGNOSIS — M7138 Other bursal cyst, other site: Secondary | ICD-10-CM | POA: Diagnosis present

## 2020-06-29 DIAGNOSIS — E0590 Thyrotoxicosis, unspecified without thyrotoxic crisis or storm: Secondary | ICD-10-CM | POA: Diagnosis present

## 2020-06-29 DIAGNOSIS — Z7952 Long term (current) use of systemic steroids: Secondary | ICD-10-CM | POA: Diagnosis not present

## 2020-06-29 DIAGNOSIS — K219 Gastro-esophageal reflux disease without esophagitis: Secondary | ICD-10-CM | POA: Diagnosis present

## 2020-06-29 DIAGNOSIS — M7989 Other specified soft tissue disorders: Secondary | ICD-10-CM | POA: Diagnosis not present

## 2020-06-29 DIAGNOSIS — L89152 Pressure ulcer of sacral region, stage 2: Secondary | ICD-10-CM | POA: Diagnosis not present

## 2020-06-29 DIAGNOSIS — L8990 Pressure ulcer of unspecified site, unspecified stage: Secondary | ICD-10-CM | POA: Insufficient documentation

## 2020-06-29 DIAGNOSIS — K560 Paralytic ileus: Secondary | ICD-10-CM | POA: Diagnosis not present

## 2020-06-29 DIAGNOSIS — N319 Neuromuscular dysfunction of bladder, unspecified: Secondary | ICD-10-CM | POA: Diagnosis not present

## 2020-06-29 DIAGNOSIS — Z833 Family history of diabetes mellitus: Secondary | ICD-10-CM | POA: Diagnosis not present

## 2020-06-29 DIAGNOSIS — Z800 Family history of malignant neoplasm of digestive organs: Secondary | ICD-10-CM | POA: Diagnosis not present

## 2020-06-29 DIAGNOSIS — M170 Bilateral primary osteoarthritis of knee: Secondary | ICD-10-CM | POA: Diagnosis present

## 2020-06-29 DIAGNOSIS — K7460 Unspecified cirrhosis of liver: Secondary | ICD-10-CM | POA: Diagnosis present

## 2020-06-29 DIAGNOSIS — Z8349 Family history of other endocrine, nutritional and metabolic diseases: Secondary | ICD-10-CM | POA: Diagnosis not present

## 2020-06-29 DIAGNOSIS — K760 Fatty (change of) liver, not elsewhere classified: Secondary | ICD-10-CM | POA: Diagnosis present

## 2020-06-29 DIAGNOSIS — N400 Enlarged prostate without lower urinary tract symptoms: Secondary | ICD-10-CM | POA: Diagnosis present

## 2020-06-29 DIAGNOSIS — G959 Disease of spinal cord, unspecified: Secondary | ICD-10-CM | POA: Diagnosis present

## 2020-06-29 DIAGNOSIS — L89893 Pressure ulcer of other site, stage 3: Secondary | ICD-10-CM | POA: Diagnosis present

## 2020-06-29 DIAGNOSIS — Z79899 Other long term (current) drug therapy: Secondary | ICD-10-CM | POA: Diagnosis not present

## 2020-06-29 HISTORY — PX: POSTERIOR CERVICAL FUSION/FORAMINOTOMY: SHX5038

## 2020-06-29 LAB — SURGICAL PCR SCREEN
MRSA, PCR: POSITIVE — AB
Staphylococcus aureus: POSITIVE — AB

## 2020-06-29 LAB — TYPE AND SCREEN
ABO/RH(D): O POS
Antibody Screen: NEGATIVE

## 2020-06-29 LAB — SARS CORONAVIRUS 2 (TAT 6-24 HRS): SARS Coronavirus 2: NEGATIVE

## 2020-06-29 LAB — ABO/RH: ABO/RH(D): O POS

## 2020-06-29 SURGERY — POSTERIOR CERVICAL FUSION/FORAMINOTOMY LEVEL 1
Anesthesia: General | Site: Neck

## 2020-06-29 MED ORDER — BACITRACIN 500 UNIT/GM EX OINT
TOPICAL_OINTMENT | CUTANEOUS | Status: DC | PRN
  Administered 2020-06-29 (×2): 1 via TOPICAL

## 2020-06-29 MED ORDER — OXYCODONE HCL 5 MG PO TABS: 5.0000 mg | ORAL_TABLET | Freq: Once | ORAL | Status: DC | PRN

## 2020-06-29 MED ORDER — PROPOFOL 10 MG/ML IV BOLUS
INTRAVENOUS | Status: AC
  Filled 2020-06-29: qty 20

## 2020-06-29 MED ORDER — PHENYLEPHRINE HCL (PRESSORS) 10 MG/ML IV SOLN
INTRAVENOUS | Status: AC
  Filled 2020-06-29: qty 2

## 2020-06-29 MED ORDER — FENTANYL CITRATE (PF) 250 MCG/5ML IJ SOLN
INTRAMUSCULAR | Status: AC
  Filled 2020-06-29: qty 5

## 2020-06-29 MED ORDER — PHENYLEPHRINE 40 MCG/ML (10ML) SYRINGE FOR IV PUSH (FOR BLOOD PRESSURE SUPPORT)
PREFILLED_SYRINGE | INTRAVENOUS | Status: DC | PRN
  Administered 2020-06-29: 80 ug via INTRAVENOUS

## 2020-06-29 MED ORDER — ONDANSETRON HCL 4 MG PO TABS: 4.0000 mg | ORAL_TABLET | Freq: Four times a day (QID) | ORAL | Status: DC | PRN

## 2020-06-29 MED ORDER — CHLORHEXIDINE GLUCONATE 0.12 % MT SOLN
OROMUCOSAL | Status: AC
  Administered 2020-06-29: 15 mL
  Filled 2020-06-29: qty 15

## 2020-06-29 MED ORDER — GADOBUTROL 1 MMOL/ML IV SOLN
7.5000 mL | Freq: Once | INTRAVENOUS | Status: AC | PRN
  Administered 2020-06-29: 7.5 mL via INTRAVENOUS

## 2020-06-29 MED ORDER — MORPHINE SULFATE (PF) 4 MG/ML IV SOLN: 4.0000 mg | INTRAVENOUS | Status: DC | PRN

## 2020-06-29 MED ORDER — CEFAZOLIN SODIUM-DEXTROSE 2-3 GM-%(50ML) IV SOLR
INTRAVENOUS | Status: DC | PRN
  Administered 2020-06-29: 2 g via INTRAVENOUS

## 2020-06-29 MED ORDER — BUPIVACAINE-EPINEPHRINE 0.5% -1:200000 IJ SOLN
INTRAMUSCULAR | Status: AC
  Filled 2020-06-29: qty 1

## 2020-06-29 MED ORDER — PHENYLEPHRINE 40 MCG/ML (10ML) SYRINGE FOR IV PUSH (FOR BLOOD PRESSURE SUPPORT)
PREFILLED_SYRINGE | INTRAVENOUS | Status: AC
  Filled 2020-06-29: qty 10

## 2020-06-29 MED ORDER — OXYCODONE HCL 5 MG PO TABS
10.0000 mg | ORAL_TABLET | ORAL | Status: DC | PRN
  Administered 2020-07-01 – 2020-07-04 (×5): 10 mg via ORAL
  Filled 2020-06-29 (×8): qty 2

## 2020-06-29 MED ORDER — METOPROLOL TARTRATE 5 MG/5ML IV SOLN
5.0000 mg | Freq: Once | INTRAVENOUS | Status: AC
  Administered 2020-06-29: 5 mg via INTRAVENOUS

## 2020-06-29 MED ORDER — ROCURONIUM BROMIDE 10 MG/ML (PF) SYRINGE
PREFILLED_SYRINGE | INTRAVENOUS | Status: AC
  Filled 2020-06-29: qty 40

## 2020-06-29 MED ORDER — FENTANYL CITRATE (PF) 250 MCG/5ML IJ SOLN
INTRAMUSCULAR | Status: DC | PRN
  Administered 2020-06-29: 50 ug via INTRAVENOUS
  Administered 2020-06-29: 100 ug via INTRAVENOUS

## 2020-06-29 MED ORDER — ROCURONIUM BROMIDE 10 MG/ML (PF) SYRINGE
PREFILLED_SYRINGE | INTRAVENOUS | Status: DC | PRN
  Administered 2020-06-29: 40 mg via INTRAVENOUS
  Administered 2020-06-29: 60 mg via INTRAVENOUS
  Administered 2020-06-29: 30 mg via INTRAVENOUS

## 2020-06-29 MED ORDER — ACETAMINOPHEN 500 MG PO TABS
1000.0000 mg | ORAL_TABLET | Freq: Once | ORAL | Status: AC
  Administered 2020-06-29: 1000 mg via ORAL
  Filled 2020-06-29: qty 2

## 2020-06-29 MED ORDER — THROMBIN 5000 UNITS EX SOLR: OROMUCOSAL | Status: DC | PRN

## 2020-06-29 MED ORDER — CHLORHEXIDINE GLUCONATE CLOTH 2 % EX PADS
6.0000 | MEDICATED_PAD | Freq: Every day | CUTANEOUS | Status: AC
  Administered 2020-07-01: 6 via TOPICAL

## 2020-06-29 MED ORDER — DOCUSATE SODIUM 100 MG PO CAPS
100.0000 mg | ORAL_CAPSULE | Freq: Two times a day (BID) | ORAL | Status: DC
  Administered 2020-06-29 – 2020-07-05 (×12): 100 mg via ORAL
  Filled 2020-06-29 (×12): qty 1

## 2020-06-29 MED ORDER — DEXAMETHASONE SODIUM PHOSPHATE 10 MG/ML IJ SOLN
INTRAMUSCULAR | Status: DC | PRN
  Administered 2020-06-29: 10 mg via INTRAVENOUS

## 2020-06-29 MED ORDER — PROPOFOL 10 MG/ML IV BOLUS
INTRAVENOUS | Status: DC | PRN
  Administered 2020-06-29: 70 mg via INTRAVENOUS

## 2020-06-29 MED ORDER — BUPIVACAINE-EPINEPHRINE 0.5% -1:200000 IJ SOLN
INTRAMUSCULAR | Status: DC | PRN
  Administered 2020-06-29: 10 mL

## 2020-06-29 MED ORDER — METOPROLOL TARTRATE 5 MG/5ML IV SOLN
INTRAVENOUS | Status: AC
  Filled 2020-06-29: qty 5

## 2020-06-29 MED ORDER — CYCLOBENZAPRINE HCL 10 MG PO TABS
10.0000 mg | ORAL_TABLET | Freq: Three times a day (TID) | ORAL | Status: DC | PRN
  Administered 2020-06-29 – 2020-07-05 (×10): 10 mg via ORAL
  Filled 2020-06-29 (×11): qty 1

## 2020-06-29 MED ORDER — MIDAZOLAM HCL 2 MG/2ML IJ SOLN
INTRAMUSCULAR | Status: AC
  Filled 2020-06-29: qty 2

## 2020-06-29 MED ORDER — CEFAZOLIN SODIUM-DEXTROSE 2-4 GM/100ML-% IV SOLN
2.0000 g | Freq: Three times a day (TID) | INTRAVENOUS | Status: AC
  Administered 2020-06-29 – 2020-06-30 (×2): 2 g via INTRAVENOUS
  Filled 2020-06-29 (×2): qty 100

## 2020-06-29 MED ORDER — ONDANSETRON HCL 4 MG/2ML IJ SOLN
INTRAMUSCULAR | Status: AC
  Filled 2020-06-29: qty 2

## 2020-06-29 MED ORDER — LACTATED RINGERS IV SOLN: INTRAVENOUS | Status: DC

## 2020-06-29 MED ORDER — ALUM & MAG HYDROXIDE-SIMETH 200-200-20 MG/5ML PO SUSP: 30.0000 mL | Freq: Four times a day (QID) | ORAL | Status: DC | PRN

## 2020-06-29 MED ORDER — PANTOPRAZOLE SODIUM 40 MG IV SOLR
40.0000 mg | Freq: Every day | INTRAVENOUS | Status: DC
  Administered 2020-06-29: 40 mg via INTRAVENOUS
  Filled 2020-06-29: qty 40

## 2020-06-29 MED ORDER — MUPIROCIN 2 % EX OINT
1.0000 "application " | TOPICAL_OINTMENT | Freq: Two times a day (BID) | CUTANEOUS | Status: DC
  Administered 2020-06-30 – 2020-07-03 (×9): 1 via NASAL
  Filled 2020-06-29 (×2): qty 22

## 2020-06-29 MED ORDER — THROMBIN 5000 UNITS EX SOLR
CUTANEOUS | Status: AC
  Filled 2020-06-29: qty 5000

## 2020-06-29 MED ORDER — MENTHOL 3 MG MT LOZG: 1.0000 | LOZENGE | OROMUCOSAL | Status: DC | PRN

## 2020-06-29 MED ORDER — OXYCODONE HCL 5 MG/5ML PO SOLN: 5.0000 mg | Freq: Once | ORAL | Status: DC | PRN

## 2020-06-29 MED ORDER — LIDOCAINE 2% (20 MG/ML) 5 ML SYRINGE
INTRAMUSCULAR | Status: AC
  Filled 2020-06-29: qty 5

## 2020-06-29 MED ORDER — ACETAMINOPHEN 650 MG RE SUPP: 650.0000 mg | RECTAL | Status: DC | PRN

## 2020-06-29 MED ORDER — BACITRACIN ZINC 500 UNIT/GM EX OINT
TOPICAL_OINTMENT | CUTANEOUS | Status: AC
  Filled 2020-06-29: qty 28.35

## 2020-06-29 MED ORDER — MEPERIDINE HCL 25 MG/ML IJ SOLN: 6.2500 mg | INTRAMUSCULAR | Status: DC | PRN

## 2020-06-29 MED ORDER — ONDANSETRON HCL 4 MG/2ML IJ SOLN
INTRAMUSCULAR | Status: DC | PRN
  Administered 2020-06-29: 4 mg via INTRAVENOUS

## 2020-06-29 MED ORDER — HYDROMORPHONE HCL 1 MG/ML IJ SOLN
INTRAMUSCULAR | Status: AC
  Filled 2020-06-29: qty 1

## 2020-06-29 MED ORDER — 0.9 % SODIUM CHLORIDE (POUR BTL) OPTIME
TOPICAL | Status: DC | PRN
  Administered 2020-06-29: 1000 mL

## 2020-06-29 MED ORDER — PHENYLEPHRINE HCL-NACL 10-0.9 MG/250ML-% IV SOLN
INTRAVENOUS | Status: DC | PRN
  Administered 2020-06-29: 45 ug/min via INTRAVENOUS

## 2020-06-29 MED ORDER — DIAZEPAM 5 MG PO TABS
5.0000 mg | ORAL_TABLET | Freq: Once | ORAL | Status: AC
  Administered 2020-07-04: 5 mg via ORAL
  Filled 2020-06-29 (×3): qty 1

## 2020-06-29 MED ORDER — ACETAMINOPHEN 500 MG PO TABS
1000.0000 mg | ORAL_TABLET | Freq: Four times a day (QID) | ORAL | Status: AC
  Administered 2020-06-29 – 2020-06-30 (×4): 1000 mg via ORAL
  Filled 2020-06-29 (×4): qty 2

## 2020-06-29 MED ORDER — HYDROMORPHONE HCL 1 MG/ML IJ SOLN
0.2500 mg | INTRAMUSCULAR | Status: DC | PRN
  Administered 2020-06-29: 0.5 mg via INTRAVENOUS

## 2020-06-29 MED ORDER — PROMETHAZINE HCL 25 MG/ML IJ SOLN: 6.2500 mg | INTRAMUSCULAR | Status: DC | PRN

## 2020-06-29 MED ORDER — MIDAZOLAM HCL 2 MG/2ML IJ SOLN: 0.5000 mg | Freq: Once | INTRAMUSCULAR | Status: DC | PRN

## 2020-06-29 MED ORDER — ACETAMINOPHEN 325 MG PO TABS
650.0000 mg | ORAL_TABLET | ORAL | Status: DC | PRN
  Administered 2020-06-30 – 2020-07-05 (×5): 650 mg via ORAL
  Filled 2020-06-29 (×5): qty 2

## 2020-06-29 MED ORDER — ONDANSETRON HCL 4 MG/2ML IJ SOLN: 4.0000 mg | Freq: Four times a day (QID) | INTRAMUSCULAR | Status: DC | PRN

## 2020-06-29 MED ORDER — MIDAZOLAM HCL 5 MG/5ML IJ SOLN
INTRAMUSCULAR | Status: DC | PRN
  Administered 2020-06-29 (×2): 1 mg via INTRAVENOUS

## 2020-06-29 MED ORDER — BISACODYL 10 MG RE SUPP
10.0000 mg | Freq: Every day | RECTAL | Status: DC | PRN
  Administered 2020-07-03: 10 mg via RECTAL
  Filled 2020-06-29: qty 1

## 2020-06-29 MED ORDER — SUGAMMADEX SODIUM 200 MG/2ML IV SOLN
INTRAVENOUS | Status: DC | PRN
  Administered 2020-06-29: 200 mg via INTRAVENOUS

## 2020-06-29 MED ORDER — OXYCODONE HCL 5 MG PO TABS
5.0000 mg | ORAL_TABLET | ORAL | Status: DC | PRN
  Administered 2020-06-29 – 2020-07-04 (×5): 5 mg via ORAL
  Filled 2020-06-29 (×5): qty 1

## 2020-06-29 MED ORDER — GLYCOPYRROLATE PF 0.2 MG/ML IJ SOSY
PREFILLED_SYRINGE | INTRAMUSCULAR | Status: AC
  Filled 2020-06-29: qty 1

## 2020-06-29 MED ORDER — PHENOL 1.4 % MT LIQD: 1.0000 | OROMUCOSAL | Status: DC | PRN

## 2020-06-29 SURGICAL SUPPLY — 63 items
APL SKNCLS STERI-STRIP NONHPOA (GAUZE/BANDAGES/DRESSINGS) ×1
BAND INSRT 18 STRL LF DISP RB (MISCELLANEOUS)
BAND RUBBER #18 3X1/16 STRL (MISCELLANEOUS) IMPLANT
BASKET BONE COLLECTION (BASKET) ×2 IMPLANT
BENZOIN TINCTURE PRP APPL 2/3 (GAUZE/BANDAGES/DRESSINGS) ×2 IMPLANT
BIT DRILL 14MM FIXED VIRAGE (DRILL) ×1 IMPLANT
BIT DRILL NEURO 2X3.1 SFT TUCH (MISCELLANEOUS) ×2 IMPLANT
BLADE CLIPPER SURG (BLADE) IMPLANT
BLADE ULTRA TIP 2M (BLADE) IMPLANT
BUR ACORN 6.0 PRECISION (BURR) IMPLANT
BUR PRECISION FLUTE 6.0 (BURR) ×2 IMPLANT
CANISTER SUCT 3000ML PPV (MISCELLANEOUS) ×2 IMPLANT
CAP CLSR POST CERV (Cap) ×8 IMPLANT
CARTRIDGE OIL MAESTRO DRILL (MISCELLANEOUS) ×1 IMPLANT
CATH COUDE FOLEY 2W 5CC 16FR (CATHETERS) ×2 IMPLANT
COVER WAND RF STERILE (DRAPES) ×2 IMPLANT
DECANTER SPIKE VIAL GLASS SM (MISCELLANEOUS) ×2 IMPLANT
DIFFUSER DRILL AIR PNEUMATIC (MISCELLANEOUS) ×2 IMPLANT
DRAPE C-ARM 42X72 X-RAY (DRAPES) ×4 IMPLANT
DRAPE LAPAROTOMY 100X72 PEDS (DRAPES) ×2 IMPLANT
DRAPE MICROSCOPE LEICA (MISCELLANEOUS) IMPLANT
DRAPE SURG 17X23 STRL (DRAPES) ×6 IMPLANT
DRILL 14MM FIXED VIRAGE (DRILL) ×2
DRILL NEURO 2X3.1 SOFT TOUCH (MISCELLANEOUS) ×4
DRSG OPSITE POSTOP 4X6 (GAUZE/BANDAGES/DRESSINGS) ×2 IMPLANT
ELECT REM PT RETURN 9FT ADLT (ELECTROSURGICAL) ×2
ELECTRODE REM PT RTRN 9FT ADLT (ELECTROSURGICAL) ×1 IMPLANT
GAUZE 4X4 16PLY RFD (DISPOSABLE) IMPLANT
GAUZE SPONGE 4X4 12PLY STRL (GAUZE/BANDAGES/DRESSINGS) IMPLANT
GLOVE BIO SURGEON STRL SZ8 (GLOVE) ×2 IMPLANT
GLOVE BIO SURGEON STRL SZ8.5 (GLOVE) ×2 IMPLANT
GLOVE EXAM NITRILE XL STR (GLOVE) IMPLANT
GOWN STRL REUS W/ TWL LRG LVL3 (GOWN DISPOSABLE) IMPLANT
GOWN STRL REUS W/ TWL XL LVL3 (GOWN DISPOSABLE) ×1 IMPLANT
GOWN STRL REUS W/TWL 2XL LVL3 (GOWN DISPOSABLE) ×2 IMPLANT
GOWN STRL REUS W/TWL LRG LVL3 (GOWN DISPOSABLE)
GOWN STRL REUS W/TWL XL LVL3 (GOWN DISPOSABLE) ×2
KIT BASIN OR (CUSTOM PROCEDURE TRAY) ×2 IMPLANT
KIT TURNOVER KIT B (KITS) ×2 IMPLANT
NEEDLE HYPO 22GX1.5 SAFETY (NEEDLE) ×2 IMPLANT
NEEDLE SPNL 18GX3.5 QUINCKE PK (NEEDLE) IMPLANT
NS IRRIG 1000ML POUR BTL (IV SOLUTION) ×2 IMPLANT
OIL CARTRIDGE MAESTRO DRILL (MISCELLANEOUS) ×2
PACK LAMINECTOMY NEURO (CUSTOM PROCEDURE TRAY) ×2 IMPLANT
PAD ARMBOARD 7.5X6 YLW CONV (MISCELLANEOUS) ×6 IMPLANT
PATTIES SURGICAL .25X.25 (GAUZE/BANDAGES/DRESSINGS) IMPLANT
PIN MAYFIELD SKULL DISP (PIN) ×2 IMPLANT
PUTTY DBM 2CC CALC GRAN (Putty) ×2 IMPLANT
ROD VIRAGE 30MMX3.5MM STR (Rod) ×4 IMPLANT
SCREW VIRAGE 3.5X14 (Screw) ×8 IMPLANT
SPONGE LAP 4X18 RFD (DISPOSABLE) IMPLANT
SPONGE NEURO XRAY DETECT 1X3 (DISPOSABLE) IMPLANT
SPONGE SURGIFOAM ABS GEL SZ50 (HEMOSTASIS) ×2 IMPLANT
STAPLER SKIN PROX WIDE 3.9 (STAPLE) IMPLANT
STRIP CLOSURE SKIN 1/2X4 (GAUZE/BANDAGES/DRESSINGS) ×2 IMPLANT
SUT ETHILON 2 0 FS 18 (SUTURE) IMPLANT
SUT VIC AB 0 CT1 18XCR BRD8 (SUTURE) ×1 IMPLANT
SUT VIC AB 0 CT1 8-18 (SUTURE) ×2
SUT VIC AB 2-0 CP2 18 (SUTURE) ×2 IMPLANT
TOWEL GREEN STERILE (TOWEL DISPOSABLE) ×2 IMPLANT
TOWEL GREEN STERILE FF (TOWEL DISPOSABLE) ×2 IMPLANT
TRAY FOLEY MTR SLVR 16FR STAT (SET/KITS/TRAYS/PACK) IMPLANT
WATER STERILE IRR 1000ML POUR (IV SOLUTION) ×2 IMPLANT

## 2020-06-29 NOTE — Plan of Care (Signed)
  Problem: Education: Goal: Knowledge of General Education information will improve Description Including pain rating scale, medication(s)/side effects and non-pharmacologic comfort measures Outcome: Progressing   Problem: Health Behavior/Discharge Planning: Goal: Ability to manage health-related needs will improve Outcome: Progressing   

## 2020-06-29 NOTE — Anesthesia Postprocedure Evaluation (Signed)
Anesthesia Post Note  Patient: Darren Harrison  Procedure(s) Performed: CERVICAL THREE -FOUR POSTERIOR CERVICAL FUSION/FORAMINOTOMY (N/A Neck)     Patient location during evaluation: PACU Anesthesia Type: General Level of consciousness: patient cooperative, oriented and sedated Pain management: pain level controlled Vital Signs Assessment: post-procedure vital signs reviewed and stable Respiratory status: spontaneous breathing, nonlabored ventilation and respiratory function stable Cardiovascular status: blood pressure returned to baseline and stable Postop Assessment: no apparent nausea or vomiting Anesthetic complications: no   No complications documented.  Last Vitals:  Vitals:   06/29/20 1553 06/29/20 1618  BP: 97/69 95/74  Pulse: 100 60  Resp: 12 14  Temp: 36.6 C 37.2 C  SpO2: 95% 98%    Last Pain:  Vitals:   06/29/20 1618  TempSrc: Oral  PainSc:                  Savvy Peeters,E. Karas Pickerill

## 2020-06-29 NOTE — Transfer of Care (Signed)
Immediate Anesthesia Transfer of Care Note  Patient: Darren Harrison  Procedure(s) Performed: CERVICAL THREE -FOUR POSTERIOR CERVICAL FUSION/FORAMINOTOMY (N/A Neck)  Patient Location: PACU  Anesthesia Type:General  Level of Consciousness: awake, alert  and oriented  Airway & Oxygen Therapy: Patient Spontanous Breathing and Patient connected to face mask oxygen  Post-op Assessment: Report given to RN and Post -op Vital signs reviewed and stable  Post vital signs: Reviewed and stable  Last Vitals:  Vitals Value Taken Time  BP 110/89 06/29/20 1453  Temp    Pulse 126 06/29/20 1456  Resp 16 06/29/20 1456  SpO2 100 % 06/29/20 1456  Vitals shown include unvalidated device data.  Last Pain:  Vitals:   06/29/20 0954  TempSrc: Oral  PainSc:       Patients Stated Pain Goal: 1 (23/95/32 0233)  Complications: No complications documented.

## 2020-06-29 NOTE — Progress Notes (Signed)
   06/29/20 1618  Assess: MEWS Score  Temp 98.9 F (37.2 C)  BP 95/74  Pulse Rate 60  Resp 14  SpO2 98 %  O2 Device Room Air  Assess: MEWS Score  MEWS Temp 0  MEWS Systolic 1  MEWS Pulse 0  MEWS RR 0  MEWS LOC 1  MEWS Score 2  MEWS Score Color Yellow  Assess: if the MEWS score is Yellow or Red  Were vital signs taken at a resting state? Yes  Focused Assessment Change from prior assessment (see assessment flowsheet)  Early Detection of Sepsis Score *See Row Information* Low  MEWS guidelines implemented *See Row Information* Yes  Treat  MEWS Interventions Administered scheduled meds/treatments  Pain Scale 0-10  Pain Score 9  Pain Type Surgical pain  Pain Location Neck  Pain Orientation Right;Left;Upper  Pain Descriptors / Indicators Aching  Pain Frequency Intermittent  Pain Onset On-going  Patients Stated Pain Goal 0  Pain Intervention(s) Repositioned  Take Vital Signs  Increase Vital Sign Frequency  Yellow: Q 2hr X 2 then Q 4hr X 2, if remains yellow, continue Q 4hrs  Escalate  MEWS: Escalate Yellow: discuss with charge nurse/RN and consider discussing with provider and RRT  Notify: Charge Nurse/RN  Name of Charge Nurse/RN Notified Lazarus Gowda, RN  Date Charge Nurse/RN Notified 06/29/20  Time Charge Nurse/RN Notified 1618  Document  Patient Outcome Stabilized after interventions  Progress note created (see row info) Yes

## 2020-06-29 NOTE — Progress Notes (Signed)
Patient couldn't complete MRI last night due to uncontrolled muscle spasm , they planned to come patient again to complete it but,  it never happened, nurse called MRI this morning and the staff member  who answered said, they will come for the patient around 7am, patient has been NPO since midnight, dilaudid 1 mg has been given this am, A&O x4, calm than before, , will update day shift nurse and continue to monitor.

## 2020-06-29 NOTE — Anesthesia Procedure Notes (Signed)
Procedure Name: Intubation Date/Time: 06/29/2020 11:47 AM Performed by: Griffin Dakin, CRNA Pre-anesthesia Checklist: Patient identified, Emergency Drugs available, Suction available and Patient being monitored Patient Re-evaluated:Patient Re-evaluated prior to induction Oxygen Delivery Method: Circle system utilized Preoxygenation: Pre-oxygenation with 100% oxygen Induction Type: IV induction Ventilation: Mask ventilation without difficulty and Oral airway inserted - appropriate to patient size Laryngoscope Size: Glidescope and 4 Grade View: Grade I Tube type: Oral Tube size: 7.5 mm Number of attempts: 1 Airway Equipment and Method: Video-laryngoscopy and Rigid stylet Placement Confirmation: ETT inserted through vocal cords under direct vision,  positive ETCO2 and breath sounds checked- equal and bilateral Secured at: 24 cm Tube secured with: Tape Dental Injury: Teeth and Oropharynx as per pre-operative assessment

## 2020-06-29 NOTE — Progress Notes (Signed)
Subjective: The patient is alert and pleasant.  Objective: Vital signs in last 24 hours: Temp:  [98.2 F (36.8 C)-98.5 F (36.9 C)] 98.5 F (36.9 C) (05/18 0828) Pulse Rate:  [60-91] 91 (05/18 0828) Resp:  [16-17] 17 (05/18 0828) BP: (106-117)/(75-93) 117/93 (05/18 0828) SpO2:  [97 %-99 %] 97 % (05/18 0828) Estimated body mass index is 24.79 kg/m as calculated from the following:   Height as of 06/21/20: 5\' 10"  (1.778 m).   Weight as of 04/14/20: 78.4 kg.   Intake/Output from previous day: No intake/output data recorded. Intake/Output this shift: No intake/output data recorded.  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  He has a good decompression at C4-5 and C5-6.  I have also reviewed the patient's lumbar MRI.  He has diffuse degenerative changes most prominent at L4-5.  He has moderate stenosis and spondylolisthesis at L4-5.  Lab Results: Recent Labs    06/28/20 1700  WBC 6.5  HGB 11.8*  HCT 36.3*  PLT 161   BMET Recent Labs    06/28/20 1700  NA 137  K 4.0  CL 106  CO2 24  GLUCOSE 87  BUN 20  CREATININE 0.63  CALCIUM 9.1    Studies/Results: MR CERVICAL SPINE WO CONTRAST  Result Date: 06/29/2020 CLINICAL DATA:  Cervical radiculopathy. Bilateral arm weakness and difficulty with ambulation. Prior cervical spine fusion 2017 EXAM: MRI CERVICAL SPINE WITHOUT CONTRAST TECHNIQUE: Multiplanar, multisequence MR imaging of the cervical spine was performed. No intravenous contrast was administered. COMPARISON:  MRI cervical spine 03/22/2015 FINDINGS: Alignment: Mild anterolisthesis C3-4. Straightening of the cervical lordosis. Vertebrae: ACDF C4-5 and C5-6.  Negative for fracture or mass. Cord: Moderately large area of ill-defined cord hyperintensity at C3-4 with associated spinal stenosis and  cord compression. This was not present previously. Small area of well-defined hyperintensity in the cord on the right at C4-5 compatible with chronic myelomalacia. Posterior Fossa, vertebral arteries, paraspinal tissues: Negative for paraspinous mass or fluid collection. Disc levels: C2-3: Moderate right foraminal encroachment due to severe facet hypertrophy on the right. Mild left foraminal encroachment due to facet hypertrophy C3-4: Disc degeneration with diffuse uncinate spurring which has progressed in the interval. In addition, there is a 9 x 6 mm posterior epidural mass on the right which has mixed high and low signal on T2. This appears to be associated with the right facet joint which is severely degenerated. Probable synovial cyst contributing to cord compression. There is severe foraminal encroachment bilaterally due to spurring. C4-5: ACDF. Bilateral facet degeneration. Moderate foraminal narrowing bilaterally. C5-6: ACDF. Bilateral facet hypertrophy. Moderate foraminal narrowing bilaterally. C6-7: Disc degeneration with diffuse uncinate spurring left greater than right. Severe facet degeneration bilaterally. Severe foraminal encroachment bilaterally. Mild spinal stenosis C7-T1: Bilateral facet degeneration with moderate foraminal narrowing bilaterally. IMPRESSION: Severe multilevel cervical spondylosis. ACDF C4-5 and C5-6 with moderate foraminal narrowing bilaterally due to spurring Severe spinal stenosis with cord compression and cord hyperintensity at C3-4. There is progressive spondylosis at this level as well as a 6 x 9 mm posterior epidural mass on the right which is likely a synovial cyst. Severe facet degeneration on the right. Severe foraminal encroachment bilaterally at C6-7. Moderate foraminal encroachment bilaterally at C7-T1 These results were called by telephone at the time of interpretation on 06/29/2020 at 8:50 am to provider [facility] ,  who verbally acknowledged these results.  Electronically Signed   By: Franchot Gallo M.D.   On: 06/29/2020 08:51   MR Lumbar Spine W Wo Contrast  Result Date: 06/29/2020 CLINICAL DATA:  Lumbar radiculopathy. Prior back surgery. Difficulty ambulating. EXAM: MRI LUMBAR SPINE WITHOUT AND WITH CONTRAST TECHNIQUE: Multiplanar and multiecho pulse sequences of the lumbar spine were obtained without and with intravenous contrast. CONTRAST:  7.29mL GADAVIST GADOBUTROL 1 MMOL/ML IV SOLN COMPARISON:  Lumbar MRI 03/01/2015 FINDINGS: Segmentation:  Normal Alignment: Mild retrolisthesis L1-2, L2-3, L3-4. 5 mm anterolisthesis L4-5. Moderate levoscoliosis. Vertebrae: Negative for fracture or mass. Discogenic edema at T12-L1 and L2-3 on the right due to disc degeneration. No evidence of spinal infection. Conus medullaris and cauda equina: Conus extends to the L1-2 level. Conus and cauda equina appear normal. Paraspinal and other soft tissues: Negative for paraspinous mass, adenopathy, fluid collection Urinary bladder is markedly distended compatible with urinary retention. Disc levels: T12-L1: Moderate disc degeneration with disc space narrowing and diffuse endplate spurring. Subarticular stenosis bilaterally left greater than right. Mild spinal stenosis L1-2: Disc degeneration with diffuse endplate spurring and bilateral facet degeneration. Mild to moderate spinal stenosis. Moderate subarticular stenosis bilaterally left greater than right L2-3: Disc degeneration with diffuse endplate spurring. Posterior decompression with adequate decompression of spinal canal. There is severe subarticular and foraminal stenosis bilaterally due to spurring with impingement of the L2 and L3 nerve root bilaterally. L3-4: Bilateral laminectomy with adequate decompression of the spinal canal. Bilateral facet hypertrophy. Disc degeneration with diffuse endplate spurring. Severe subarticular and foraminal stenosis bilaterally with impingement of the L3 and L4 nerve roots bilaterally. L4-5: 5  mm anterolisthesis. Severe disc degeneration. Severe facet degeneration and facet hypertrophy bilaterally. Possible synovial cyst on the right. Severe subarticular and foraminal stenosis bilaterally with bilateral L4 and L5 nerve root impingement. Moderate spinal stenosis. L5-S1: Disc degeneration and spurring asymmetric to the left. Bilateral facet degeneration. Moderate subarticular and foraminal stenosis bilaterally left greater than right. IMPRESSION: 1. Marked enlargement of the urinary bladder compatible with urinary retention 2. Severe multilevel degenerative change in the lumbar spine causing spinal and foraminal stenosis as above. Severe subarticular and foraminal stenosis present at multiple levels in the lumbar spine. 3. These results were called by telephone at the time of interpretation on 06/29/2020 at 8:49 am to provider [facility] , who verbally acknowledged these results. Electronically Signed   By: Franchot Gallo M.D.   On: 06/29/2020 08:50    Assessment/Plan: Cervical stenosis, stenosis, myelopathy, Quadraparesis: I have discussed the situation with the patient.  We have discussed the various treatment options which include doing nothing versus surgery.  I have described the surgical treatment option of a C3-4 laminectomy with removal of the cyst with instrumentation and fusion.  I described the surgery to him.  We have discussed the risk of surgery, the risk of anesthesia, hemorrhage, infection, spinal fluid leak, worsening deficits, failure to improve the deficits, instrumentation malfunction or malplacement, fusion failure, medical risk, etc.  I have answered all his questions.  He has decided proceed with surgery.  We also discussed his lumbar degenerative changes stenosis.  I do not think this is the issue presently.  LOS: 0 days     Ophelia Charter 06/29/2020, 9:00 AM

## 2020-06-29 NOTE — Plan of Care (Signed)

## 2020-06-29 NOTE — Progress Notes (Signed)
Patient transported to OR via bed by orderly. 

## 2020-06-29 NOTE — Op Note (Signed)
Brief history: The patient is a 65 year old white male on whom I performed a C4-5 and C5-6 anterior cervicectomy, fusion and plating for a severe cervical myelopathy in 2017.  He initially did well but has developed recurrent worsening cervical myelopathy and Quadraparesis.  He was worked up with a cervical MRI which demonstrated a large synovial cyst at C3-4.  I discussed the various treatment options with the patient.  He has decided proceed with surgery after weighing the risk, benefits and alternatives.  Preop diagnosis: Cervical spinal stenosis, cervical synovial cyst, cervical myelopathy, cervicalgia, Quadraparesis  Postop diagnosis: The same  Procedure: C3 and C4 laminectomy for resection of cervical synovial cyst using microdissection; C3-4 posterior arthrodesis with local morselized autograft bone and intra grow bone graft extender; C3-4 posterior cervical instrumentation with Zimmer titanium lateral mass screws and rods  Surgeon: Dr. Earle Gell  Assistant: Dr. Duffy Rhody and Arnetha Massy, NP  Anesthesia: General tracheal  Estimated blood loss: 100 cc  Specimens: None  Drains: None  Complications: None  Description of procedure: The patient was brought to the operating room by the anesthesia team.  General endotracheal anesthesia was induced.  I then applied the Mayfield three-point headrest to the patient's calvarium.  The patient was turned to the prone position on the chest rolls.  The patient's suboccipital region was shaved with clippers and this region in the posterior cervical and posterior thoracic region was then prepared with Betadine scrub and Betadine solution.  Sterile drapes were applied.  I then injected the area to be incised with Marcaine with epinephrine solution.  I did a scalpel to make a linear midline incision over the C3-4 interspace.  I used electrocautery to perform a bilateral subperiosteal dissection exposing the spinous process and lamina of C3 and  C4.  We used intraoperative fluoroscopy to confirm our location.  I inserted the McCullough retractor for exposure.  I used electrocautery to expose the bilateral C3-4 lateral masses.  We then brought the operative microscope into the field and under its magnification and illumination we completed the microdissection/decompression.  I began the decompression by using the drill and Leksell rongeur to remove the spinous process and part of the lamina at C3 and C4.  I carefully completed the C3 and C4 laminectomy with a 2 mm Kerrison punches decompressing the thecal sac.  As expected we encountered a large synovial cyst on the right.  It was quite adherent to the dura.  I used microdissection to free up the process from this thecal sac.  I gently remove the synovial cyst with the Kerrison punches and the pituitary forceps decompressing the thecal sac and spinal cord.  We now turned our attention to the instrumentation.  Under fluoroscopic guidance I created a pilot hole in the middle of the lateral masses bilaterally at C3 and C4.  We used the drill guide to drill a 14 mm hole aiming cephalad and laterally.  I remove the drill and probed inside the holes to rule out cortical breaches.  There were none.  I then inserted 14 mm polyaxial titanium lateral mass screws into the lateral masses bilaterally at C3 and C4.  We got good bony purchase.  We then connected the unilateral screws with a rod.  We secured the rod in place with the caps which we tightened appropriately.  This completed the instrumentation.  We now turned our attention to the posterior lateral thesis.  We used a high-speed drill to decorticate the lateral lamina facets and lateral masses  bilaterally at C3 and C4.  We laid a combination of local morselized autograft bone we obtained during the decompression as well as intra grow bone graft extender over these decorticated posterior lateral structures completing the posterior lateral thesis at C3-4  bilaterally.  We then obtained hemostasis using bipolar cautery.  We remove the retractors.  We then reapproximated patient's cervical fascia with interrupted 0 Vicryl suture.  We reapproximated the subcutaneous tissue with interrupted 2-0 Vicryl suture.  We reapproximated the skin with Steri-Strips and benzoin.  The wound was then coated with bacitracin ointment.  A sterile dressing was applied.  The drapes were removed.  The patient was then carefully turned to the supine position.  I then removed the Mayfield report headrest from his calvarium.  By report all sponge, instrument, and needle counts were correct at the end of this case.

## 2020-06-29 NOTE — Progress Notes (Signed)
Subjective: The patient is in no apparent distress.  Objective: Vital signs in last 24 hours: Temp:  [97.7 F (36.5 C)-98.5 F (36.9 C)] 97.7 F (36.5 C) (05/18 1453) Pulse Rate:  [60-100] 100 (05/18 1453) Resp:  [12-18] 12 (05/18 1453) BP: (106-117)/(75-93) 110/89 (05/18 1453) SpO2:  [97 %-100 %] 100 % (05/18 1453) Weight:  [79.4 kg] 79.4 kg (05/18 1021) Estimated body mass index is 25.11 kg/m as calculated from the following:   Height as of this encounter: 5\' 10"  (1.778 m).   Weight as of this encounter: 79.4 kg.   Intake/Output from previous day: No intake/output data recorded. Intake/Output this shift: Total I/O In: 700 [I.V.:700] Out: 1900 [Urine:1850; Blood:50]  Physical exam the patient is alert.  He is moving all 4 extremities.  Lab Results: Recent Labs    06/28/20 1700  WBC 6.5  HGB 11.8*  HCT 36.3*  PLT 161   BMET Recent Labs    06/28/20 1700  NA 137  K 4.0  CL 106  CO2 24  GLUCOSE 87  BUN 20  CREATININE 0.63  CALCIUM 9.1    Studies/Results: MR CERVICAL SPINE WO CONTRAST  Result Date: 06/29/2020 CLINICAL DATA:  Cervical radiculopathy. Bilateral arm weakness and difficulty with ambulation. Prior cervical spine fusion 2017 EXAM: MRI CERVICAL SPINE WITHOUT CONTRAST TECHNIQUE: Multiplanar, multisequence MR imaging of the cervical spine was performed. No intravenous contrast was administered. COMPARISON:  MRI cervical spine 03/22/2015 FINDINGS: Alignment: Mild anterolisthesis C3-4. Straightening of the cervical lordosis. Vertebrae: ACDF C4-5 and C5-6.  Negative for fracture or mass. Cord: Moderately large area of ill-defined cord hyperintensity at C3-4 with associated spinal stenosis and cord compression. This was not present previously. Small area of well-defined hyperintensity in the cord on the right at C4-5 compatible with chronic myelomalacia. Posterior Fossa, vertebral arteries, paraspinal tissues: Negative for paraspinous mass or fluid collection.  Disc levels: C2-3: Moderate right foraminal encroachment due to severe facet hypertrophy on the right. Mild left foraminal encroachment due to facet hypertrophy C3-4: Disc degeneration with diffuse uncinate spurring which has progressed in the interval. In addition, there is a 9 x 6 mm posterior epidural mass on the right which has mixed high and low signal on T2. This appears to be associated with the right facet joint which is severely degenerated. Probable synovial cyst contributing to cord compression. There is severe foraminal encroachment bilaterally due to spurring. C4-5: ACDF. Bilateral facet degeneration. Moderate foraminal narrowing bilaterally. C5-6: ACDF. Bilateral facet hypertrophy. Moderate foraminal narrowing bilaterally. C6-7: Disc degeneration with diffuse uncinate spurring left greater than right. Severe facet degeneration bilaterally. Severe foraminal encroachment bilaterally. Mild spinal stenosis C7-T1: Bilateral facet degeneration with moderate foraminal narrowing bilaterally. IMPRESSION: Severe multilevel cervical spondylosis. ACDF C4-5 and C5-6 with moderate foraminal narrowing bilaterally due to spurring Severe spinal stenosis with cord compression and cord hyperintensity at C3-4. There is progressive spondylosis at this level as well as a 6 x 9 mm posterior epidural mass on the right which is likely a synovial cyst. Severe facet degeneration on the right. Severe foraminal encroachment bilaterally at C6-7. Moderate foraminal encroachment bilaterally at C7-T1 These results were called by telephone at the time of interpretation on 06/29/2020 at 8:50 am to provider [facility] , who verbally acknowledged these results. Electronically Signed   By: Franchot Gallo M.D.   On: 06/29/2020 08:51   MR Lumbar Spine W Wo Contrast  Result Date: 06/29/2020 CLINICAL DATA:  Lumbar radiculopathy. Prior back surgery. Difficulty ambulating. EXAM: MRI LUMBAR SPINE  WITHOUT AND WITH CONTRAST TECHNIQUE:  Multiplanar and multiecho pulse sequences of the lumbar spine were obtained without and with intravenous contrast. CONTRAST:  7.76mL GADAVIST GADOBUTROL 1 MMOL/ML IV SOLN COMPARISON:  Lumbar MRI 03/01/2015 FINDINGS: Segmentation:  Normal Alignment: Mild retrolisthesis L1-2, L2-3, L3-4. 5 mm anterolisthesis L4-5. Moderate levoscoliosis. Vertebrae: Negative for fracture or mass. Discogenic edema at T12-L1 and L2-3 on the right due to disc degeneration. No evidence of spinal infection. Conus medullaris and cauda equina: Conus extends to the L1-2 level. Conus and cauda equina appear normal. Paraspinal and other soft tissues: Negative for paraspinous mass, adenopathy, fluid collection Urinary bladder is markedly distended compatible with urinary retention. Disc levels: T12-L1: Moderate disc degeneration with disc space narrowing and diffuse endplate spurring. Subarticular stenosis bilaterally left greater than right. Mild spinal stenosis L1-2: Disc degeneration with diffuse endplate spurring and bilateral facet degeneration. Mild to moderate spinal stenosis. Moderate subarticular stenosis bilaterally left greater than right L2-3: Disc degeneration with diffuse endplate spurring. Posterior decompression with adequate decompression of spinal canal. There is severe subarticular and foraminal stenosis bilaterally due to spurring with impingement of the L2 and L3 nerve root bilaterally. L3-4: Bilateral laminectomy with adequate decompression of the spinal canal. Bilateral facet hypertrophy. Disc degeneration with diffuse endplate spurring. Severe subarticular and foraminal stenosis bilaterally with impingement of the L3 and L4 nerve roots bilaterally. L4-5: 5 mm anterolisthesis. Severe disc degeneration. Severe facet degeneration and facet hypertrophy bilaterally. Possible synovial cyst on the right. Severe subarticular and foraminal stenosis bilaterally with bilateral L4 and L5 nerve root impingement. Moderate spinal  stenosis. L5-S1: Disc degeneration and spurring asymmetric to the left. Bilateral facet degeneration. Moderate subarticular and foraminal stenosis bilaterally left greater than right. IMPRESSION: 1. Marked enlargement of the urinary bladder compatible with urinary retention 2. Severe multilevel degenerative change in the lumbar spine causing spinal and foraminal stenosis as above. Severe subarticular and foraminal stenosis present at multiple levels in the lumbar spine. 3. These results were called by telephone at the time of interpretation on 06/29/2020 at 8:49 am to provider [facility] , who verbally acknowledged these results. Electronically Signed   By: Franchot Gallo M.D.   On: 06/29/2020 08:50    Assessment/Plan: The patient is doing well.  I spoke with his wife.  LOS: 0 days     Darren Harrison 06/29/2020, 3:07 PM

## 2020-06-29 NOTE — Progress Notes (Signed)
Patient returned to room by PACU RN via bed at 1618 on 06/29/20.

## 2020-06-29 NOTE — Anesthesia Preprocedure Evaluation (Addendum)
Anesthesia Evaluation  Patient identified by MRN, date of birth, ID band Patient awake    Reviewed: Allergy & Precautions, NPO status , Patient's Chart, lab work & pertinent test results, reviewed documented beta blocker date and time   History of Anesthesia Complications Negative for: history of anesthetic complications  Airway Mallampati: II  TM Distance: >3 FB Neck ROM: Full    Dental  (+) Caps, Dental Advisory Given, Missing   Pulmonary neg pulmonary ROS,  06/28/2020 SARS Coronavirus NEG   breath sounds clear to auscultation       Cardiovascular hypertension, Pt. on medications and Pt. on home beta blockers + dysrhythmias Atrial Fibrillation  Rhythm:Regular Rate:Normal     Neuro/Psych    GI/Hepatic Neg liver ROS, GERD  Controlled,  Endo/Other  Hyperthyroidism   Renal/GU negative Renal ROS     Musculoskeletal  (+) Arthritis ,   Abdominal   Peds  Hematology negative hematology ROS (+)   Anesthesia Other Findings   Reproductive/Obstetrics                            Anesthesia Physical Anesthesia Plan  ASA: III  Anesthesia Plan: General   Post-op Pain Management:    Induction: Intravenous  PONV Risk Score and Plan: 2 and Ondansetron and Dexamethasone  Airway Management Planned: Oral ETT and Video Laryngoscope Planned  Additional Equipment: None  Intra-op Plan:   Post-operative Plan: Extubation in OR  Informed Consent: I have reviewed the patients History and Physical, chart, labs and discussed the procedure including the risks, benefits and alternatives for the proposed anesthesia with the patient or authorized representative who has indicated his/her understanding and acceptance.     Dental advisory given  Plan Discussed with: CRNA and Surgeon  Anesthesia Plan Comments:        Anesthesia Quick Evaluation

## 2020-06-30 ENCOUNTER — Encounter (HOSPITAL_COMMUNITY): Payer: Self-pay | Admitting: Neurosurgery

## 2020-06-30 MED ORDER — SULFAMETHOXAZOLE-TRIMETHOPRIM 800-160 MG PO TABS
1.0000 | ORAL_TABLET | Freq: Two times a day (BID) | ORAL | Status: DC
  Administered 2020-06-30 – 2020-07-03 (×8): 1 via ORAL
  Filled 2020-06-30 (×9): qty 1

## 2020-06-30 MED ORDER — PANTOPRAZOLE SODIUM 40 MG PO TBEC
40.0000 mg | DELAYED_RELEASE_TABLET | Freq: Every day | ORAL | Status: DC
  Administered 2020-06-30 – 2020-07-04 (×5): 40 mg via ORAL
  Filled 2020-06-30 (×6): qty 1

## 2020-06-30 NOTE — Progress Notes (Signed)
Patient could not ambulate this past  Midnight because was tired, blood pressure was low,and  was in pain as well , nurse gave him pain meds PRN and monitored BP all night , gave flexeril this morning because he was complaining of uncontrolled leg movement again, will update date shift nurse and continue to monitor.

## 2020-06-30 NOTE — Progress Notes (Signed)
IP rehab admissions - I met with patient at the bedside and gave him rehab booklets.  He is interested in CIR but wants to be home in time for a wedding next Saturday.  I will open his case with [employer] and request inpatient rehab admission.  I am awaiting PT evaluation and recommendations.  Call for questions.  #[phone] 

## 2020-06-30 NOTE — Plan of Care (Signed)

## 2020-06-30 NOTE — Progress Notes (Addendum)
Occupational Therapy Evaluation Patient Details Name: Darren Harrison MRN: [id number] DOB: Apr 10, 1955 Today's Date: 06/30/2020    History of Present Illness Darren Harrison is a 65 year old male. He presented to clinic 5/18 with his wife with difficulty ambulating; performd C3 and C4 laminectomy for resection of cervical synovial cyst using microdissection 5/18.with a history significant for hypertension, GERD, BPH, fatty liver disease, atrial fibrillation, fatty liver disease and hyperthyroid.  He has a history of a C4-5, C5-6 ACDF by Dr. Arnoldo Morale on 04/11/2015.  He also underwent an L2-3, L3-4, L4-5 decompression on 10/20/2015. He has had multiple falls over the last few months. He has been wheelchair bound since04/28/2022. His neck pain has been significantly worse over the last 2-3 months. He also has issues with his right shoulder and bilateral knees. The only thing his liver doctor has allowed him to take for pain is Tylenol, which doesn't provide much relief.   Clinical Impression   [name] was evaluated for the above impairments, he is doing well with reports of 3/10 pain and seemingly improved sensation and strength in all extremities. PTA pt was wc level and required dependent surface transfers, he stopped worked about 3 weeks ago when he became wc bound. His wife is providing max assistance for all ADLs including dressing, bathing, feeding and toileting. Mikes RUE is grossly 3-/5 strength with limited ROM, LUE is grossly 3+/5 strength with decreased coordination and sensation, ROM is WFL. Evaluation was completed at bed level due to not having cervical collar and pain in cervical region with any movement. Contacted ortho and materials for brace delivery. Pt would benefit from PT evaluation acutely, left voicemail for PT order with [facility], CNA of Dr. Jacqulynn Cadet. Pt would benefit from continued OT services acutely to progress OOB ADLs and function. Recommend d/c to home as long as pt can have 24/7 supervision  and [place].     Follow Up Recommendations  Supervision/Assistance - 24 hour;Home health OT    Equipment Recommendations  Other (comment) (drop arm BSC)    Recommendations for Other Services PT consult     Precautions / Restrictions Precautions Precautions: Cervical;Fall Precaution Booklet Issued: No Precaution Comments: reviewed precautions; plan to provide booklet next OOB session Required Braces or Orthoses: Cervical Brace Cervical Brace: Hard collar;Other (comment) (Apply in sitting, may take off while in bed, may go to bathroom with out brace, may shower without brace) Restrictions Weight Bearing Restrictions: No      Mobility Bed Mobility Overal bed mobility: Needs Assistance             General bed mobility comments: deferred this session due to cervical pain and no cervical brace in room. Spoke with ortho and materials, collar should be in room soon.    Transfers Overall transfer level: Needs assistance               General transfer comment: deferred OOB this session due to cervial pain and no cervical brace in room at the time of evaluation    Balance Overall balance assessment: History of Falls;Needs assistance         ADL either performed or assessed with clinical judgement   ADL Overall ADL's : Needs assistance/impaired Eating/Feeding: Moderate assistance;Bed level   Grooming: Oral care;Wash/dry hands;Wash/dry face;Moderate assistance;Bed level   Upper Body Bathing: Moderate assistance;Bed level   Lower Body Bathing: Total assistance;Bed level   Upper Body Dressing : Maximal assistance;Bed level   Lower Body Dressing: Total assistance;Bed level   Toilet Transfer:  Total assistance;+2 for physical assistance;BSC;Transfer board           Functional mobility during ADLs: Maximal assistance;+2 for physical assistance General ADL Comments: Pt reported seemingly imporved sensation and strength in extremeties     Vision Baseline  Vision/History: Wears glasses Wears Glasses: At all times              Pertinent Vitals/Pain Pain Assessment: 0-10 Pain Score: 3  Pain Location: Neck Pain Descriptors / Indicators: Discomfort;Grimacing;Guarding Pain Intervention(s): Limited activity within patient's tolerance;Monitored during session     Hand Dominance Right   Extremity/Trunk Assessment Upper Extremity Assessment Upper Extremity Assessment: RUE deficits/detail;LUE deficits/detail RUE Deficits / Details: Prior rotator cuff injury, pt limited to 30* active flexion and abduction, PROM was painful at cervical level therefore deferred at this time. Elbow and wrist ROM is WFL, 3+/5. 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 Sensation: decreased light touch;decreased proprioception LUE Coordination: decreased fine motor;decreased gross motor   Lower Extremity Assessment Lower Extremity Assessment: Defer to PT evaluation   Cervical / Trunk Assessment Cervical / Trunk Assessment: Normal;Other exceptions Cervical / Trunk Exceptions: cervical sx   Communication Communication Communication: No difficulties   Cognition Arousal/Alertness: Awake/alert Behavior During Therapy: WFL for tasks assessed/performed Overall Cognitive Status: Within Functional Limits for tasks assessed           General Comments  Pt cervical dressing dry and in tact, BP in supine at the start of the session 119/74; pt reports seemingly improved sensation and strength in extremeties            Home Living Family/patient expects to be discharged to:: Private residence Living Arrangements: Spouse/significant other Available Help at Discharge:  Family Type of Home: House Home Access: Level entry     Home Layout: Two level;Able to live on main level with bedroom/bathroom     Bathroom Shower/Tub: Walk-in shower (6 inch threshold)   Bathroom Toilet: Standard Bathroom Accessibility: Yes How Accessible: Accessible via wheelchair Home Equipment: Grab bars - toilet;Wheelchair - [employer] - 2 wheels (2 wheelchairs, one for the shower, transfer board)   Additional Comments: wife works mon-fri, comes home at lunch to check on pt      Prior Functioning/Environment Level of Independence: Needs assistance  Gait / Transfers Assistance Needed: Pt reported that for the past month his wife has been dependently transferring him to/from surfaces; pt has been wc level since 4/28 ADL's / Homemaking Assistance Needed: Pt reports he needed a lot of assistance for all aspects of ADL including eating, dressing, bathing, toileting Communication / Swallowing Assistance Needed: pt reports that his voice "goes out" after talkign for awhile, believes it may have to do wtih his thyroid          OT Problem List: Decreased strength;Decreased range of motion;Decreased activity tolerance;Impaired balance (sitting and/or standing);Decreased safety awareness;Decreased knowledge of use of DME or AE;Pain;Impaired UE functional use      OT Treatment/Interventions: Self-care/ADL training;Therapeutic exercise;DME and/or AE instruction;Balance training    OT Goals(Current goals can be found in the care plan section) Acute Rehab OT Goals Patient Stated Goal: to get stronger and go home OT Goal Formulation: With patient Time For Goal Achievement: 07/14/20 Potential to Achieve Goals: Fair ADL Goals  Pt Will Perform Eating: with set-up;sitting Pt Will Perform Grooming: with set-up;sitting Pt Will Perform Upper Body Bathing: with supervision;sitting Pt Will Perform Upper Body Dressing: with set-up;sitting Pt Will Transfer to Toilet: squat pivot  transfer;bedside commode;with min guard assist  OT Frequency: Min 2X/week   Barriers to D/C: Decreased caregiver support  Wife works during the week, does not have 24/7 support          AM-PAC OT "6 Clicks" Daily Activity     Outcome Measure Help from another person eating meals?: A Lot Help from another person taking care of personal grooming?: A Lot Help from another person toileting, which includes using toliet, bedpan, or urinal?: Total Help from another person bathing (including washing, rinsing, drying)?: Total Help from another person to put on and taking off regular upper body clothing?: A Lot Help from another person to put on and taking off regular lower body clothing?: Total 6 Click Score: 9   End of Session Nurse Communication: Mobility status;Other (comment) (need for cervical collar)  Activity Tolerance: Patient limited by pain Patient left: in bed;with call bell/phone within reach;with bed alarm set  OT Visit Diagnosis: Other abnormalities of gait and mobility (R26.89);Repeated falls (R29.6);Muscle weakness (generalized) (M62.81);Pain;History of falling (Z91.81) Pain - Right/Left: Right (, left, and neck) Pain - part of body:  (neck)                Time: 2993-7169 OT Time Calculation (min): 30 min Charges:  OT General Charges $OT Visit: 1 Visit OT Evaluation $OT Eval Moderate Complexity: 1 Mod OT Treatments $Self Care/Home Management : 8-22 mins   Kortne All A Rayann Jolley 06/30/2020, 10:47 AM
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
## 2020-06-30 NOTE — Evaluation (Signed)
Physical Therapy Evaluation Patient Details Name: Darren Harrison MRN: [id number] DOB: 1955-05-26 Today's Date: 06/30/2020   History of Present Illness  The pt is a 65 yo male presenting 5/18 for C3-4 laminectomy, and posterior instrumentation due to ongoing cervical myelopathy and quadraparesis. PMH includes: prior cervical surgeries and fusions (C4-6 ACDF), and lumbar surgery (L3-5), R elbow fx and surgery, R ulnnar nerve injury.    Clinical Impression  Pt in bed upon arrival of PT, agreeable to evaluation at this time. Prior to admission the pt was mobilizing with use of a WC at home, with assist from wife for all mobility and transfers as well as ADLs. The pt now presents with limitations in functional mobility, strength, ROM, coordination, endurance, and stability due to above dx, and will continue to benefit from skilled PT to address these deficits. The pt demos great motivation and is able to initiate all movements, but requires assist ranging from mod-max assist to complete bed mobility and initial squat-pivot transfer from bed-recliner due to deficits in strength, coordination, and stability. The pt was able to complete transfer, then continue with repeated sit-stand transfer practice with improved wt acceptance on BLE as well as improved posture with each attempt. The pt was unable to tolerate standing position for >2-5 seconds. Recommend max continued therapies acutely to progress functional strength and independence for transfers, as well as continued therapies after d/c to maximize functional return and pt independence.      Follow Up Recommendations CIR (if unable to attend CIR, OP neuro PT)    Equipment Recommendations  None recommended by PT (pt has needed equipment)    Recommendations for Other Services Rehab consult     Precautions / Restrictions Precautions Precautions: Cervical;Fall Precaution Booklet Issued: No Precaution Comments: reviewed precautions; plan to provide  booklet next OOB session Required Braces or Orthoses: Cervical Brace Cervical Brace: Hard collar (applied in sitting, may go to bathroom and shower without brace) Restrictions Weight Bearing Restrictions: No      Mobility  Bed Mobility Overal bed mobility: Needs Assistance Bed Mobility: Rolling;Sidelying to Sit Rolling: Mod assist Sidelying to sit: Max assist       General bed mobility comments: pt able to initiate roll, but needing modA to complete. maxA to complete push up to sitting due to poor UE strength bilaterally.    Transfers Overall transfer level: Needs assistance Equipment used: 1 person hand held assist Transfers: Sit to/from [employer];Lateral/Scoot Transfers Sit to Stand: Max assist   Squat pivot transfers: Max assist    Lateral/Scoot Transfers: Max assist General transfer comment: initially used repeated small squat-pivot transfers to move from EOB to drop-arm recliner. Pt unable to use RUE to hold PT or to push, pt with better grip in LUE but unable to use to reach/pull for transfers. pt completed x3 sit-squat and sit-stand with maxA and gait belt with bilateral knee blocked. increased use of momentum, cues for hand positioning, and posture  Ambulation/Gait             General Gait Details: pt unable to maintain stand, unable to take steps      Balance Overall balance assessment: History of Falls;Needs assistance Sitting-balance support: Bilateral upper extremity supported;Feet supported Sitting balance-Leahy Scale: Poor Sitting balance - Comments: pt with constant rocking, reaching for BUE support, struggles to maintain bilateral feet on ground with static sitting Postural control: Posterior lean Standing balance support: Bilateral upper extremity supported Standing balance-Leahy Scale: Zero Standing balance comment: reliant on maxA  from therapist                             Pertinent Vitals/Pain Pain Assessment:  0-10 Pain Score: 2  Pain Location: Neck Pain Descriptors / Indicators: Discomfort;Grimacing;Guarding Pain Intervention(s): Limited activity within patient's tolerance;Monitored during session;Repositioned    Home Living Family/patient expects to be discharged to:: Private residence Living Arrangements: Spouse/significant other Available Help at Discharge: Family;Available PRN/intermittently (wife works, but can come home every few hours) Type of Home: House Home Access: Level entry     [place]: Two level;Able to live on main level with bedroom/bathroom Home Equipment: Grab bars - toilet;Wheelchair - [employer] - 2 wheels (2 wheelchairs, one for the shower, transfer board) Additional Comments: wife works mon-fri, comes home at lunch to check on pt    Prior Function Level of Independence: Needs assistance   Gait / Transfers Assistance Needed: Pt reported that for the past month his wife has been dependently transferring him to/from surfaces; pt has been wc level since 4/28  ADL's / Homemaking Assistance Needed: Pt reports he needed a lot of assistance for all aspects of ADL including eating, dressing, bathing, toileting        Hand Dominance   Dominant Hand: Right    Extremity/Trunk Assessment   Upper Extremity Assessment Upper Extremity Assessment: RUE deficits/detail;Defer to OT evaluation (limited in ROM, difficulty gripping bilaterally)    Lower Extremity Assessment Lower Extremity Assessment: Generalized weakness;LLE deficits/detail;RLE deficits/detail RLE Deficits / Details: grossly 3+/5 across all joints, unable to attain full knee ext ROM. RLE Sensation: WNL RLE Coordination: WNL LLE Deficits / Details: grossly 4-/5 across all joints, impaired coordination LLE Coordination: decreased gross motor;decreased fine motor    Cervical / Trunk Assessment Cervical / Trunk Assessment: Normal;Other exceptions Cervical / Trunk Exceptions: cervical sx  Communication    Communication: No difficulties  Cognition Arousal/Alertness: Awake/alert Behavior During Therapy: WFL for tasks assessed/performed Overall Cognitive Status: Within Functional Limits for tasks assessed                                        General Comments      Exercises General Exercises - Lower Extremity Long Arc Quad: AROM;Both;10 reps;Seated (with 3 second hold) Hip Flexion/Marching: AROM;Both;10 reps;Seated Toe Raises: AROM;Both;10 reps;Seated Heel Raises: AROM;Both;10 reps;Seated   Assessment/Plan    PT Assessment Patient needs continued PT services  PT Problem List Decreased strength;Decreased range of motion;Decreased activity tolerance;Decreased balance;Decreased mobility;Decreased coordination       PT Treatment Interventions DME instruction;Gait training;Stair training;Functional mobility training;Therapeutic activities;Therapeutic exercise;Balance training;Neuromuscular re-education;Patient/family education    PT Goals (Current goals can be found in the Care Plan section)  Acute Rehab PT Goals Patient Stated Goal: to get stronger and go home PT Goal Formulation: With patient Time For Goal Achievement: 07/07/20 Potential to Achieve Goals: Good    Frequency Min 4X/week    AM-PAC PT "6 Clicks" Mobility  Outcome Measure Help needed turning from your back to your side while in a flat bed without using bedrails?: A Lot Help needed moving from lying on your back to sitting on the side of a flat bed without using bedrails?: A Lot Help needed moving to and from a bed to a chair (including a wheelchair)?: Total Help needed standing up from a chair using your arms (e.g., wheelchair or bedside chair)?: Total Help  needed to walk in hospital room?: Total Help needed climbing 3-5 steps with a railing? : Total 6 Click Score: 8    End of Session Equipment Utilized During Treatment: Gait belt;Cervical collar Activity Tolerance: Patient tolerated treatment  well;No increased pain Patient left: in chair;with call bell/phone within reach;with chair alarm set Nurse Communication: Mobility status;Need for lift equipment PT Visit Diagnosis: Other abnormalities of gait and mobility (R26.89);Muscle weakness (generalized) (M62.81)    Time: 0737-1062 PT Time Calculation (min) (ACUTE ONLY): 45 min   Charges:   PT Evaluation $PT Eval Low Complexity: 1 Low PT Treatments $Therapeutic Activity: 23-37 mins        Karma Ganja, PT, DPT   Acute Rehabilitation Department Pager #: [phone]  Otho Bellows 06/30/2020, 4:57 PM

## 2020-06-30 NOTE — Progress Notes (Signed)
Subjective: The patient is alert and pleasant.  He says his hands are already working much better.  He admits preop he had difficulty with urination and cloudy/foul-smelling urine.  Objective: Vital signs in last 24 hours: Temp:  [97.6 F (36.4 C)-98.9 F (37.2 C)] 98.8 F (37.1 C) (05/19 1453) Pulse Rate:  [53-100] 83 (05/19 1453) Resp:  [12-20] 14 (05/19 0752) BP: (93-116)/(54-87) 95/57 (05/19 1453) SpO2:  [95 %-100 %] 99 % (05/19 1453) Estimated body mass index is 25.11 kg/m as calculated from the following:   Height as of this encounter: 5\' 10"  (1.778 m).   Weight as of this encounter: 79.4 kg.   Intake/Output from previous day: 05/18 0701 - 05/19 0700 In: 1407.4 [I.V.:1307.4; IV Piggyback:100] Out: 2050 [Urine:2000; Blood:50] Intake/Output this shift: Total I/O In: 120 [P.O.:120] Out: 1400 [Urine:1400]  Physical exam the patient is alert and oriented.  He is moving all 4 extremities.  The patient cervical dressing has a small bloodstain.  Lab Results: Recent Labs    06/28/20 1700  WBC 6.5  HGB 11.8*  HCT 36.3*  PLT 161   BMET Recent Labs    06/28/20 1700  NA 137  K 4.0  CL 106  CO2 24  GLUCOSE 87  BUN 20  CREATININE 0.63  CALCIUM 9.1    Studies/Results: DG Cervical Spine 1 View  Result Date: 06/29/2020 CLINICAL DATA:  Cervical fusion EXAM: DG C-ARM 1-60 MIN; DG CERVICAL SPINE - 1 VIEW COMPARISON:  None. FLUOROSCOPY TIME:  Fluoroscopy Time:  12 seconds Radiation Exposure Index (if provided by the fluoroscopic device): 2.8 mGy Number of Acquired Spot Images: 1 FINDINGS: Lateral radiograph of the cervical spine reveals fusion at C3-4 posteriorly. Changes of prior fusion at C4-5 anteriorly are noted as well. IMPRESSION: Status post posterior fusion at C3-4. Electronically Signed   By: Inez Catalina M.D.   On: 06/29/2020 15:18   MR CERVICAL SPINE WO CONTRAST  Result Date: 06/29/2020 CLINICAL DATA:  Cervical radiculopathy. Bilateral arm weakness and  difficulty with ambulation. Prior cervical spine fusion 2017 EXAM: MRI CERVICAL SPINE WITHOUT CONTRAST TECHNIQUE: Multiplanar, multisequence MR imaging of the cervical spine was performed. No intravenous contrast was administered. COMPARISON:  MRI cervical spine 03/22/2015 FINDINGS: Alignment: Mild anterolisthesis C3-4. Straightening of the cervical lordosis. Vertebrae: ACDF C4-5 and C5-6.  Negative for fracture or mass. Cord: Moderately large area of ill-defined cord hyperintensity at C3-4 with associated spinal stenosis and cord compression. This was not present previously. Small area of well-defined hyperintensity in the cord on the right at C4-5 compatible with chronic myelomalacia. Posterior Fossa, vertebral arteries, paraspinal tissues: Negative for paraspinous mass or fluid collection. Disc levels: C2-3: Moderate right foraminal encroachment due to severe facet hypertrophy on the right. Mild left foraminal encroachment due to facet hypertrophy C3-4: Disc degeneration with diffuse uncinate spurring which has progressed in the interval. In addition, there is a 9 x 6 mm posterior epidural mass on the right which has mixed high and low signal on T2. This appears to be associated with the right facet joint which is severely degenerated. Probable synovial cyst contributing to cord compression. There is severe foraminal encroachment bilaterally due to spurring. C4-5: ACDF. Bilateral facet degeneration. Moderate foraminal narrowing bilaterally. C5-6: ACDF. Bilateral facet hypertrophy. Moderate foraminal narrowing bilaterally. C6-7: Disc degeneration with diffuse uncinate spurring left greater than right. Severe facet degeneration bilaterally. Severe foraminal encroachment bilaterally. Mild spinal stenosis C7-T1: Bilateral facet degeneration with moderate foraminal narrowing bilaterally. IMPRESSION: Severe multilevel cervical spondylosis. ACDF C4-5  and C5-6 with moderate foraminal narrowing bilaterally due to spurring  Severe spinal stenosis with cord compression and cord hyperintensity at C3-4. There is progressive spondylosis at this level as well as a 6 x 9 mm posterior epidural mass on the right which is likely a synovial cyst. Severe facet degeneration on the right. Severe foraminal encroachment bilaterally at C6-7. Moderate foraminal encroachment bilaterally at C7-T1 These results were called by telephone at the time of interpretation on 06/29/2020 at 8:50 am to provider [facility] , who verbally acknowledged these results. Electronically Signed   By: Franchot Gallo M.D.   On: 06/29/2020 08:51   MR Lumbar Spine W Wo Contrast  Result Date: 06/29/2020 CLINICAL DATA:  Lumbar radiculopathy. Prior back surgery. Difficulty ambulating. EXAM: MRI LUMBAR SPINE WITHOUT AND WITH CONTRAST TECHNIQUE: Multiplanar and multiecho pulse sequences of the lumbar spine were obtained without and with intravenous contrast. CONTRAST:  7.32mL GADAVIST GADOBUTROL 1 MMOL/ML IV SOLN COMPARISON:  Lumbar MRI 03/01/2015 FINDINGS: Segmentation:  Normal Alignment: Mild retrolisthesis L1-2, L2-3, L3-4. 5 mm anterolisthesis L4-5. Moderate levoscoliosis. Vertebrae: Negative for fracture or mass. Discogenic edema at T12-L1 and L2-3 on the right due to disc degeneration. No evidence of spinal infection. Conus medullaris and cauda equina: Conus extends to the L1-2 level. Conus and cauda equina appear normal. Paraspinal and other soft tissues: Negative for paraspinous mass, adenopathy, fluid collection Urinary bladder is markedly distended compatible with urinary retention. Disc levels: T12-L1: Moderate disc degeneration with disc space narrowing and diffuse endplate spurring. Subarticular stenosis bilaterally left greater than right. Mild spinal stenosis L1-2: Disc degeneration with diffuse endplate spurring and bilateral facet degeneration. Mild to moderate spinal stenosis. Moderate subarticular stenosis bilaterally left greater than right L2-3: Disc  degeneration with diffuse endplate spurring. Posterior decompression with adequate decompression of spinal canal. There is severe subarticular and foraminal stenosis bilaterally due to spurring with impingement of the L2 and L3 nerve root bilaterally. L3-4: Bilateral laminectomy with adequate decompression of the spinal canal. Bilateral facet hypertrophy. Disc degeneration with diffuse endplate spurring. Severe subarticular and foraminal stenosis bilaterally with impingement of the L3 and L4 nerve roots bilaterally. L4-5: 5 mm anterolisthesis. Severe disc degeneration. Severe facet degeneration and facet hypertrophy bilaterally. Possible synovial cyst on the right. Severe subarticular and foraminal stenosis bilaterally with bilateral L4 and L5 nerve root impingement. Moderate spinal stenosis. L5-S1: Disc degeneration and spurring asymmetric to the left. Bilateral facet degeneration. Moderate subarticular and foraminal stenosis bilaterally left greater than right. IMPRESSION: 1. Marked enlargement of the urinary bladder compatible with urinary retention 2. Severe multilevel degenerative change in the lumbar spine causing spinal and foraminal stenosis as above. Severe subarticular and foraminal stenosis present at multiple levels in the lumbar spine. 3. These results were called by telephone at the time of interpretation on 06/29/2020 at 8:49 am to provider [facility] , who verbally acknowledged these results. Electronically Signed   By: Franchot Gallo M.D.   On: 06/29/2020 08:50   DG C-Arm 1-60 Min  Result Date: 06/29/2020 CLINICAL DATA:  Cervical fusion EXAM: DG C-ARM 1-60 MIN; DG CERVICAL SPINE - 1 VIEW COMPARISON:  None. FLUOROSCOPY TIME:  Fluoroscopy Time:  12 seconds Radiation Exposure Index (if provided by the fluoroscopic device): 2.8 mGy Number of Acquired Spot Images: 1 FINDINGS: Lateral radiograph of the cervical spine reveals fusion at C3-4 posteriorly. Changes of prior fusion at C4-5 anteriorly are  noted as well. IMPRESSION: Status post posterior fusion at C3-4. Electronically Signed   By: Inez Catalina  M.D.   On: 06/29/2020 15:18    Assessment/Plan: Postop day #1: The patient is doing much better.  We will continue to work with PT and OT.  Urinalysis/urine culture: Pending.  I will start him empirically on Bactrim.  We will likely discontinue his Foley catheter tomorrow.  LOS: 1 day     Ophelia Charter 06/30/2020, 2:54 PM

## 2020-07-01 LAB — URINALYSIS, ROUTINE W REFLEX MICROSCOPIC
Bilirubin Urine: NEGATIVE
Glucose, UA: NEGATIVE mg/dL
Ketones, ur: NEGATIVE mg/dL
Nitrite: NEGATIVE
Protein, ur: NEGATIVE mg/dL
Specific Gravity, Urine: 1.01 (ref 1.005–1.030)
pH: 7 (ref 5.0–8.0)

## 2020-07-01 NOTE — Progress Notes (Signed)
Physical Therapy Treatment Patient Details Name: Darren Harrison MRN: [id number] DOB: 02/16/1955 Today's Date: 07/01/2020    History of Present Illness The pt is a 65 yo male presenting 5/18 for C3-4 laminectomy, and posterior instrumentation due to ongoing cervical myelopathy and quadraparesis. PMH includes: prior cervical surgeries and fusions (C4-6 ACDF), and lumbar surgery (L3-5), R elbow fx and surgery, R ulnnar nerve injury.    PT Comments    Pt required max assist bed mobility and +2 max assist sit to stand in stedy. Stedy for transfer bed to recliner. Mod assist to maintain upright trunk during stedy transfer. Pt in recliner at end of session with feet elevated. Pt is highly motivated to participate in therapy and regain strength and independence.    Follow Up Recommendations  CIR     Equipment Recommendations  None recommended by PT    Recommendations for Other Services Rehab consult     Precautions / Restrictions Precautions Precautions: Cervical;Fall Precaution Comments: reviewed precautions Required Braces or Orthoses: Cervical Brace Cervical Brace: Hard collar (applied in sitting, may go to bathroom and shower without brace)    Mobility  Bed Mobility Overal bed mobility: Needs Assistance Bed Mobility: Rolling;Sidelying to Sit;Sit to Sidelying Rolling: Mod assist Sidelying to sit: Max assist;HOB elevated     Sit to sidelying: Max assist;HOB elevated General bed mobility comments: assist for all aspects of mobility    Transfers Overall transfer level: Needs assistance   Transfers: Sit to/from Stand Sit to Stand: +2 physical assistance;Max assist         General transfer comment: Unsuccessful with sit to stand using RW or lateral scoot transfer due to pain and significant weakness BUE and trunk. Able to perform sit to stand in stedy with +2 max assist, facilitating stand with bed pad under hips and using gait belt. Pt required trunk support from therapist  to maintain upright sit during pivot to recliner.  Ambulation/Gait                 Stairs             Wheelchair Mobility    Modified Rankin (Stroke Patients Only)       Balance Overall balance assessment: History of Falls;Needs assistance Sitting-balance support: Bilateral upper extremity supported;Feet supported Sitting balance-Leahy Scale: Poor Sitting balance - Comments: min to mod assist to maintain sitting balance EOB Postural control: Posterior lean Standing balance support: Bilateral upper extremity supported;During functional activity Standing balance-Leahy Scale: Zero Standing balance comment: reliant on maxA from therapist                            Cognition Arousal/Alertness: Awake/alert Behavior During Therapy: WFL for tasks assessed/performed Overall Cognitive Status: Within Functional Limits for tasks assessed                                        Exercises      General Comments        Pertinent Vitals/Pain Pain Assessment: Faces Faces Pain Scale: Hurts even more Pain Location: Neck with mobility Pain Descriptors / Indicators: Discomfort;Grimacing;Guarding Pain Intervention(s): Limited activity within patient's tolerance;Repositioned;Monitored during session    Home Living                      Prior Function  PT Goals (current goals can now be found in the care plan section) Acute Rehab PT Goals Patient Stated Goal: to get stronger and go home Progress towards PT goals: Progressing toward goals    Frequency    Min 4X/week      PT Plan Current plan remains appropriate    Co-evaluation              AM-PAC PT "6 Clicks" Mobility   Outcome Measure  Help needed turning from your back to your side while in a flat bed without using bedrails?: A Lot Help needed moving from lying on your back to sitting on the side of a flat bed without using bedrails?: A Lot Help needed  moving to and from a bed to a chair (including a wheelchair)?: Total Help needed standing up from a chair using your arms (e.g., wheelchair or bedside chair)?: Total Help needed to walk in hospital room?: Total Help needed climbing 3-5 steps with a railing? : Total 6 Click Score: 8    End of Session Equipment Utilized During Treatment: Gait belt;Cervical collar Activity Tolerance: Patient tolerated treatment well Patient left: in chair;with call bell/phone within reach Nurse Communication: Mobility status;Need for lift equipment PT Visit Diagnosis: Other abnormalities of gait and mobility (R26.89);Muscle weakness (generalized) (M62.81)     Time: 6659-9357 PT Time Calculation (min) (ACUTE ONLY): 47 min  Charges:  $Therapeutic Activity: 23-37 mins                     Lorrin Goodell, PT  Office # [phone] Pager [phone]    [name] 07/01/2020, 12:53 PM

## 2020-07-01 NOTE — Progress Notes (Signed)
Occupational Therapy Treatment Patient Details Name: HAYDON DORRIS MRN: [id number] DOB: 05/24/1955 Today's Date: 07/01/2020    History of present illness The pt is a 65 yo male presenting 5/18 for C3-4 laminectomy, and posterior instrumentation due to ongoing cervical myelopathy and quadraparesis. PMH includes: prior cervical surgeries and fusions (C4-6 ACDF), and lumbar surgery (L3-5), R elbow fx and surgery, R ulnnar nerve injury.   OT comments  [name] is doing well, limited by pain however eager to participate with therapies. [name] was sitting EOB with PT upon arrival. Pt was max A +2 to completed sit>stand into sara steady to be dependently transferred into the chair. Pt is max A for trunk balance when sitting unsupported. [name] requires his bilat elbows to be supported with pillows when sitting in chair, and his food to be set up close to his left side in order to complete self feeding with min A. Brace was doffed in chair to allow for self feeding, while head was supported by pillows and vc to maintain cervical precautions.  Collar donned post-meal. [name] benefits from continued OT services acutely to progress function in ADLs. D/c plan updated to CIR, pt agreeable but hesitant due to a family event next Sunday.    Follow Up Recommendations  Supervision/Assistance - 24 hour;CIR    Equipment Recommendations  Other (comment)       Precautions / Restrictions Precautions Precautions: Cervical;Fall Precaution Booklet Issued: No Precaution Comments: reviewed precautions Required Braces or Orthoses: Cervical Brace Cervical Brace: Hard collar       Mobility Bed Mobility Overal bed mobility: Needs Assistance Bed Mobility: Rolling;Sidelying to Sit;Sit to Sidelying Rolling: Mod assist Sidelying to sit: Max assist;HOB elevated     Sit to sidelying: Max assist;HOB elevated General bed mobility comments: pt sitting EOB upon arrival    Transfers Overall transfer level: Needs  assistance Equipment used:  (sara steady) Transfers: Sit to/from Stand Sit to Stand: +2 physical assistance;Max assist         General transfer comment: max A +2 for boosting into steady and trunk balance in the steady    Balance Overall balance assessment: History of Falls;Needs assistance Sitting-balance support: Bilateral upper extremity supported;Feet supported Sitting balance-Leahy Scale: Poor Sitting balance - Comments: min to mod assist to maintain sitting balance EOB Postural control: Posterior lean Standing balance support: Bilateral upper extremity supported;During functional activity Standing balance-Leahy Scale: Zero Standing balance comment: reliant on maxA from therapist              ADL either performed or assessed with clinical judgement   ADL Overall ADL's : Needs assistance/impaired Eating/Feeding: Minimal assistance;Sitting            Functional mobility during ADLs: Maximal assistance;+2 for physical assistance (with sara steady) General ADL Comments: Pt required set up for feeding in sitting due to difficulty lifting arms against gravity, min A for inital bites of food and set up with pillows for arms to be supported               Cognition Arousal/Alertness: Awake/alert Behavior During Therapy: WFL for tasks assessed/performed Overall Cognitive Status: Within Functional Limits for tasks assessed                       General Comments No new skin integry issues, pt not takin gpain medications due to liver disease per RN, becomes frustrated when he is unabel to complete a task    Pertinent Vitals/ Pain  Pain Assessment: Faces Faces Pain Scale: Hurts even more Pain Location: Neck with mobility Pain Descriptors / Indicators: Discomfort;Grimacing;Guarding Pain Intervention(s): Limited activity within patient's tolerance;Monitored during session         Frequency  Min 2X/week        Progress Toward Goals  OT  Goals(current goals can now be found in the care plan section)  Progress towards OT goals: Progressing toward goals  Acute Rehab OT Goals Patient Stated Goal: to get stronger and go home OT Goal Formulation: With patient Time For Goal Achievement: 07/14/20 Potential to Achieve Goals: Fair ADL Goals Pt Will Perform Eating: with set-up;sitting Pt Will Perform Grooming: with set-up;sitting Pt Will Perform Upper Body Bathing: with supervision;sitting Pt Will Perform Upper Body Dressing: with set-up;sitting Pt Will Transfer to Toilet: squat pivot transfer;bedside commode;with min guard assist  Plan Discharge plan needs to be updated       AM-PAC OT "6 Clicks" Daily Activity     Outcome Measure   Help from another person eating meals?: A Little Help from another person taking care of personal grooming?: A Lot Help from another person toileting, which includes using toliet, bedpan, or urinal?: Total Help from another person bathing (including washing, rinsing, drying)?: Total Help from another person to put on and taking off regular upper body clothing?: A Lot Help from another person to put on and taking off regular lower body clothing?: Total 6 Click Score: 10    End of Session Equipment Utilized During Treatment: Gait belt;Other (comment) (steady)  OT Visit Diagnosis: Other abnormalities of gait and mobility (R26.89);Repeated falls (R29.6);Muscle weakness (generalized) (M62.81);Pain;History of falling (Z91.81) Pain - Right/Left:  (neck) Pain - part of body:  (neck)   Activity Tolerance Patient limited by pain   Patient Left in chair;with chair alarm set;Other (comment) (PT in room)   Nurse Communication Mobility status (pt position)        Time: 1210-1230 OT Time Calculation (min): 20 min  Charges: OT General Charges $OT Visit: 1 Visit OT Treatments $Self Care/Home Management : 8-22 mins    Jonathon Tan A Tramain Gershman 07/01/2020, 3:58 PM
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
## 2020-07-01 NOTE — Progress Notes (Signed)
Patient has many muscle spasms, refuses most pain medicine due to fear of harming his liver. Patient wondering if can try baclofen PRN spasms?

## 2020-07-01 NOTE — Progress Notes (Signed)
Subjective: The patient is alert and pleasant.  He says his legs are stronger since surgery.  Objective: Vital signs in last 24 hours: Temp:  [97.6 F (36.4 C)-98.8 F (37.1 C)] 98.2 F (36.8 C) (05/20 0411) Pulse Rate:  [79-88] 80 (05/20 0411) Resp:  [14-16] 14 (05/20 0411) BP: (95-125)/(54-98) 117/77 (05/20 0411) SpO2:  [96 %-100 %] 96 % (05/20 0411) Estimated body mass index is 25.11 kg/m as calculated from the following:   Height as of this encounter: 5\' 10"  (1.778 m).   Weight as of this encounter: 79.4 kg.   Intake/Output from previous day: 05/19 0701 - 05/20 0700 In: 1335.5 [P.O.:120; I.V.:1215.5] Out: 2350 [Urine:2350] Intake/Output this shift: No intake/output data recorded.  Physical exam patient is alert and pleasant.  He is moving all 4 extremities.  Lab Results: Recent Labs    06/28/20 1700  WBC 6.5  HGB 11.8*  HCT 36.3*  PLT 161   BMET Recent Labs    06/28/20 1700  NA 137  K 4.0  CL 106  CO2 24  GLUCOSE 87  BUN 20  CREATININE 0.63  CALCIUM 9.1    Studies/Results: DG Cervical Spine 1 View  Result Date: 06/29/2020 CLINICAL DATA:  Cervical fusion EXAM: DG C-ARM 1-60 MIN; DG CERVICAL SPINE - 1 VIEW COMPARISON:  None. FLUOROSCOPY TIME:  Fluoroscopy Time:  12 seconds Radiation Exposure Index (if provided by the fluoroscopic device): 2.8 mGy Number of Acquired Spot Images: 1 FINDINGS: Lateral radiograph of the cervical spine reveals fusion at C3-4 posteriorly. Changes of prior fusion at C4-5 anteriorly are noted as well. IMPRESSION: Status post posterior fusion at C3-4. Electronically Signed   By: Inez Catalina M.D.   On: 06/29/2020 15:18   MR CERVICAL SPINE WO CONTRAST  Result Date: 06/29/2020 CLINICAL DATA:  Cervical radiculopathy. Bilateral arm weakness and difficulty with ambulation. Prior cervical spine fusion 2017 EXAM: MRI CERVICAL SPINE WITHOUT CONTRAST TECHNIQUE: Multiplanar, multisequence MR imaging of the cervical spine was performed. No  intravenous contrast was administered. COMPARISON:  MRI cervical spine 03/22/2015 FINDINGS: Alignment: Mild anterolisthesis C3-4. Straightening of the cervical lordosis. Vertebrae: ACDF C4-5 and C5-6.  Negative for fracture or mass. Cord: Moderately large area of ill-defined cord hyperintensity at C3-4 with associated spinal stenosis and cord compression. This was not present previously. Small area of well-defined hyperintensity in the cord on the right at C4-5 compatible with chronic myelomalacia. Posterior Fossa, vertebral arteries, paraspinal tissues: Negative for paraspinous mass or fluid collection. Disc levels: C2-3: Moderate right foraminal encroachment due to severe facet hypertrophy on the right. Mild left foraminal encroachment due to facet hypertrophy C3-4: Disc degeneration with diffuse uncinate spurring which has progressed in the interval. In addition, there is a 9 x 6 mm posterior epidural mass on the right which has mixed high and low signal on T2. This appears to be associated with the right facet joint which is severely degenerated. Probable synovial cyst contributing to cord compression. There is severe foraminal encroachment bilaterally due to spurring. C4-5: ACDF. Bilateral facet degeneration. Moderate foraminal narrowing bilaterally. C5-6: ACDF. Bilateral facet hypertrophy. Moderate foraminal narrowing bilaterally. C6-7: Disc degeneration with diffuse uncinate spurring left greater than right. Severe facet degeneration bilaterally. Severe foraminal encroachment bilaterally. Mild spinal stenosis C7-T1: Bilateral facet degeneration with moderate foraminal narrowing bilaterally. IMPRESSION: Severe multilevel cervical spondylosis. ACDF C4-5 and C5-6 with moderate foraminal narrowing bilaterally due to spurring Severe spinal stenosis with cord compression and cord hyperintensity at C3-4. There is progressive spondylosis at this level as  well as a 6 x 9 mm posterior epidural mass on the right which  is likely a synovial cyst. Severe facet degeneration on the right. Severe foraminal encroachment bilaterally at C6-7. Moderate foraminal encroachment bilaterally at C7-T1 These results were called by telephone at the time of interpretation on 06/29/2020 at 8:50 am to provider [facility] , who verbally acknowledged these results. Electronically Signed   By: Franchot Gallo M.D.   On: 06/29/2020 08:51   MR Lumbar Spine W Wo Contrast  Result Date: 06/29/2020 CLINICAL DATA:  Lumbar radiculopathy. Prior back surgery. Difficulty ambulating. EXAM: MRI LUMBAR SPINE WITHOUT AND WITH CONTRAST TECHNIQUE: Multiplanar and multiecho pulse sequences of the lumbar spine were obtained without and with intravenous contrast. CONTRAST:  7.45mL GADAVIST GADOBUTROL 1 MMOL/ML IV SOLN COMPARISON:  Lumbar MRI 03/01/2015 FINDINGS: Segmentation:  Normal Alignment: Mild retrolisthesis L1-2, L2-3, L3-4. 5 mm anterolisthesis L4-5. Moderate levoscoliosis. Vertebrae: Negative for fracture or mass. Discogenic edema at T12-L1 and L2-3 on the right due to disc degeneration. No evidence of spinal infection. Conus medullaris and cauda equina: Conus extends to the L1-2 level. Conus and cauda equina appear normal. Paraspinal and other soft tissues: Negative for paraspinous mass, adenopathy, fluid collection Urinary bladder is markedly distended compatible with urinary retention. Disc levels: T12-L1: Moderate disc degeneration with disc space narrowing and diffuse endplate spurring. Subarticular stenosis bilaterally left greater than right. Mild spinal stenosis L1-2: Disc degeneration with diffuse endplate spurring and bilateral facet degeneration. Mild to moderate spinal stenosis. Moderate subarticular stenosis bilaterally left greater than right L2-3: Disc degeneration with diffuse endplate spurring. Posterior decompression with adequate decompression of spinal canal. There is severe subarticular and foraminal stenosis bilaterally due to spurring  with impingement of the L2 and L3 nerve root bilaterally. L3-4: Bilateral laminectomy with adequate decompression of the spinal canal. Bilateral facet hypertrophy. Disc degeneration with diffuse endplate spurring. Severe subarticular and foraminal stenosis bilaterally with impingement of the L3 and L4 nerve roots bilaterally. L4-5: 5 mm anterolisthesis. Severe disc degeneration. Severe facet degeneration and facet hypertrophy bilaterally. Possible synovial cyst on the right. Severe subarticular and foraminal stenosis bilaterally with bilateral L4 and L5 nerve root impingement. Moderate spinal stenosis. L5-S1: Disc degeneration and spurring asymmetric to the left. Bilateral facet degeneration. Moderate subarticular and foraminal stenosis bilaterally left greater than right. IMPRESSION: 1. Marked enlargement of the urinary bladder compatible with urinary retention 2. Severe multilevel degenerative change in the lumbar spine causing spinal and foraminal stenosis as above. Severe subarticular and foraminal stenosis present at multiple levels in the lumbar spine. 3. These results were called by telephone at the time of interpretation on 06/29/2020 at 8:49 am to provider [facility] , who verbally acknowledged these results. Electronically Signed   By: Franchot Gallo M.D.   On: 06/29/2020 08:50   DG C-Arm 1-60 Min  Result Date: 06/29/2020 CLINICAL DATA:  Cervical fusion EXAM: DG C-ARM 1-60 MIN; DG CERVICAL SPINE - 1 VIEW COMPARISON:  None. FLUOROSCOPY TIME:  Fluoroscopy Time:  12 seconds Radiation Exposure Index (if provided by the fluoroscopic device): 2.8 mGy Number of Acquired Spot Images: 1 FINDINGS: Lateral radiograph of the cervical spine reveals fusion at C3-4 posteriorly. Changes of prior fusion at C4-5 anteriorly are noted as well. IMPRESSION: Status post posterior fusion at C3-4. Electronically Signed   By: Inez Catalina M.D.   On: 06/29/2020 15:18    Assessment/Plan: Postop day 2: We will continue  mobilizing with PT managing.  We will remove his Foley catheter.  He  may go home over the weekend with outpatient therapy.  LOS: 2 days     Darren Harrison 07/01/2020, 7:44 AM

## 2020-07-01 NOTE — TOC Progression Note (Signed)
Transition of Care [facility]) - Progression Note    Patient Details  Name: Darren Harrison MRN: [id number] Date of Birth: 1955-02-20  Transition of Care [facility]) CM/SW Contact  Milinda Antis, [place] Phone Number: 07/01/2020, 12:07 PM  Clinical Narrative:     CSW following patient for any d/c planning needs once medically stable.  CIR currently following patient.    Lind Covert, MSW, LCSWA       Expected Discharge Plan and Services                                                 Social Determinants of Health (SDOH) Interventions    Readmission Risk Interventions No flowsheet data found.

## 2020-07-02 ENCOUNTER — Other Ambulatory Visit: Payer: Self-pay

## 2020-07-02 MED ORDER — ENOXAPARIN SODIUM 40 MG/0.4ML IJ SOSY
40.0000 mg | PREFILLED_SYRINGE | INTRAMUSCULAR | Status: DC
  Administered 2020-07-03 – 2020-07-05 (×3): 40 mg via SUBCUTANEOUS
  Filled 2020-07-02 (×3): qty 0.4

## 2020-07-02 MED ORDER — DIAZEPAM 5 MG PO TABS
10.0000 mg | ORAL_TABLET | Freq: Three times a day (TID) | ORAL | Status: DC | PRN
  Filled 2020-07-02: qty 2

## 2020-07-02 NOTE — Progress Notes (Signed)
Subjective: Patient reports significant muscle spasms  Objective: Vital signs in last 24 hours: Temp:  [98.1 F (36.7 C)-99.2 F (37.3 C)] 98.7 F (37.1 C) (05/21 0449) Pulse Rate:  [61-81] 70 (05/21 0806) Resp:  [14-16] 16 (05/21 0449) BP: (95-108)/(58-88) 108/88 (05/21 0806) SpO2:  [97 %-98 %] 97 % (05/21 0449)  Intake/Output from previous day: 05/20 0701 - 05/21 0700 In: 120 [P.O.:120] Out: 1400 [Urine:1400] Intake/Output this shift: No intake/output data recorded.  +hyperreflexia, hand grip 2/5 bilaterally. 3/5 D, B, T, 4/5 in LEs Dressing c/d  Lab Results: No results for input(s): WBC, HGB, HCT, PLT in the last 72 hours. BMET No results for input(s): NA, K, CL, CO2, GLUCOSE, BUN, CREATININE, CALCIUM in the last 72 hours.  Studies/Results: No results found.  Assessment/Plan: S/p posterior cervical decompression and fusion for severe stenosis with myelopathy - likely good CIR candidate - will start lovenox - will increase muscle relaxants  LOS: 3 days     Vallarie Mare 07/02/2020, 12:15 PM

## 2020-07-02 NOTE — Progress Notes (Signed)
Orthopedic Tech Progress Note Patient Details:  Darren Harrison 01-20-1956 [id number] RN stated "patient has collar" Patient ID: Darren Harrison, male   DOB: 1955/03/02, 65 y.o.   MRN: [id number]   [name] 07/02/2020, 7:53 AM

## 2020-07-02 NOTE — Progress Notes (Signed)
Physical Therapy Treatment Patient Details Name: Darren Harrison MRN: [id number] DOB: 21-Jan-1956 Today's Date: 07/02/2020    History of Present Illness The pt is a 65 yo male presenting 5/18 for C3-4 laminectomy, and posterior instrumentation due to ongoing cervical myelopathy and quadraparesis. PMH includes: prior cervical surgeries and fusions (C4-6 ACDF), and lumbar surgery (L3-5), R elbow fx and surgery, R ulnnar nerve injury.    PT Comments    Max assist bed mobility, +2 max assist sit to stand in stedy, and dependent transfer bed to recliner using stedy. Demonstrates poor sitting balance requiring mod assist to sit EOB and in stedy. Assist required to place bilat hands on front bar of stedy for standing. Pt in recliner with feet elevated at end of session.    Follow Up Recommendations  CIR     Equipment Recommendations  None recommended by PT    Recommendations for Other Services       Precautions / Restrictions Precautions Precautions: Cervical;Fall Precaution Comments: reviewed precautions Required Braces or Orthoses: Cervical Brace Cervical Brace: Hard collar;Other (comment) (ok to remove in bed and to/from bathroom)    Mobility  Bed Mobility Overal bed mobility: Needs Assistance Bed Mobility: Rolling;Sidelying to Sit Rolling: Mod assist Sidelying to sit: Max assist;HOB elevated       General bed mobility comments: assist for all aspects of mobility    Transfers Overall transfer level: Needs assistance   Transfers: Sit to/from Stand Sit to Stand: +2 physical assistance;Max assist;+2 safety/equipment         General transfer comment: +2 max assist using gait belt and bed pad at hips to boost, dependent transfer to recliner using stedy  Ambulation/Gait                 Stairs             Wheelchair Mobility    Modified Rankin (Stroke Patients Only)       Balance Overall balance assessment: History of Falls;Needs  assistance Sitting-balance support: Bilateral upper extremity supported;Feet supported Sitting balance-Leahy Scale: Poor Sitting balance - Comments: mod assist to maintain sitting balance EOB and in stedy                                    Cognition Arousal/Alertness: Awake/alert Behavior During Therapy: WFL for tasks assessed/performed Overall Cognitive Status: Within Functional Limits for tasks assessed                                        Exercises General Exercises - Lower Extremity Ankle Circles/Pumps: AROM;Both;5 reps Quad Sets: AROM;Both;5 reps Heel Slides: AAROM;Right;Left;5 reps    General Comments        Pertinent Vitals/Pain Pain Assessment: Faces Faces Pain Scale: Hurts even more Pain Location: neck/shoulders with mobility Pain Descriptors / Indicators: Discomfort;Grimacing;Guarding Pain Intervention(s): Limited activity within patient's tolerance;Monitored during session;Repositioned    Home Living                      Prior Function            PT Goals (current goals can now be found in the care plan section) Acute Rehab PT Goals Patient Stated Goal: to get stronger and go home Progress towards PT goals: Progressing toward goals    Frequency  Min 4X/week      PT Plan Current plan remains appropriate    Co-evaluation              AM-PAC PT "6 Clicks" Mobility   Outcome Measure  Help needed turning from your back to your side while in a flat bed without using bedrails?: A Lot Help needed moving from lying on your back to sitting on the side of a flat bed without using bedrails?: A Lot Help needed moving to and from a bed to a chair (including a wheelchair)?: Total Help needed standing up from a chair using your arms (e.g., wheelchair or bedside chair)?: Total Help needed to walk in hospital room?: Total Help needed climbing 3-5 steps with a railing? : Total 6 Click Score: 8    End of Session  Equipment Utilized During Treatment: Gait belt;Cervical collar Activity Tolerance: Patient tolerated treatment well Patient left: in chair;with call bell/phone within reach;with chair alarm set Nurse Communication: Mobility status;Need for lift equipment PT Visit Diagnosis: Other abnormalities of gait and mobility (R26.89);Muscle weakness (generalized) (M62.81)     Time: 3159-4585 PT Time Calculation (min) (ACUTE ONLY): 16 min  Charges:  $Therapeutic Activity: 8-22 mins                     Lorrin Goodell, PT  Office # [phone] Pager [phone]    [name] 07/02/2020, 2:47 PM

## 2020-07-03 MED ORDER — MAGNESIUM CITRATE PO SOLN: 1.0000 | Freq: Once | ORAL | Status: DC

## 2020-07-03 MED ORDER — BISACODYL 5 MG PO TBEC
5.0000 mg | DELAYED_RELEASE_TABLET | Freq: Every day | ORAL | Status: DC
  Administered 2020-07-05: 5 mg via ORAL
  Filled 2020-07-03 (×2): qty 1

## 2020-07-03 MED ORDER — MAGNESIUM CITRATE PO SOLN
1.0000 | Freq: Once | ORAL | Status: AC
  Administered 2020-07-03: 1 via ORAL
  Filled 2020-07-03: qty 296

## 2020-07-03 MED ORDER — BACLOFEN 10 MG PO TABS
5.0000 mg | ORAL_TABLET | Freq: Three times a day (TID) | ORAL | Status: DC
  Administered 2020-07-03 – 2020-07-05 (×5): 5 mg via ORAL
  Filled 2020-07-03 (×5): qty 1

## 2020-07-03 NOTE — Plan of Care (Signed)
  Problem: Education: Goal: Knowledge of General Education information will improve Description: Including pain rating scale, medication(s)/side effects and non-pharmacologic comfort measures Outcome: Progressing   Problem: Clinical Measurements: Goal: Will remain free from infection Outcome: Progressing   Problem: Clinical Measurements: Goal: Respiratory complications will improve Outcome: Progressing   Problem: Clinical Measurements: Goal: Cardiovascular complication will be avoided Outcome: Progressing   Problem: Activity: Goal: Risk for activity intolerance will decrease Outcome: Progressing   Problem: Nutrition: Goal: Adequate nutrition will be maintained Outcome: Progressing   Problem: Coping: Goal: Level of anxiety will decrease Outcome: Progressing   Problem: Elimination: Goal: Will not experience complications related to bowel motility Outcome: Progressing Goal: Will not experience complications related to urinary retention Outcome: Progressing   Problem: Pain Managment: Goal: General experience of comfort will improve Outcome: Progressing   Problem: Safety: Goal: Ability to remain free from injury will improve Outcome: Progressing   Problem: Skin Integrity: Goal: Risk for impaired skin integrity will decrease Outcome: Progressing

## 2020-07-03 NOTE — Progress Notes (Signed)
Pt seen on rounds, having some back spasms and constipation, stable BUE weakness with some RUE wasting, notable abductor pollicis brevis wasting, 4-/5 proximally and distally in BUE, BLE are 4/5 w/ bilateral hand numbness.  -start baclofen 5mg  - can up-titrate after the constipation improves -d/c diazepam given cirrhosis and possible (rare) hepatotoxicity -mag citrate x1 -start dulcolax scheduled tomorrow -CIR pending

## 2020-07-03 NOTE — Plan of Care (Signed)
  Problem: Health Behavior/Discharge Planning: Goal: Ability to manage health-related needs will improve Outcome: Not Progressing   Problem: Activity: Goal: Risk for activity intolerance will decrease Outcome: Progressing   Problem: Pain Managment: Goal: General experience of comfort will improve Outcome: Not Progressing

## 2020-07-04 LAB — URINE CULTURE: Culture: 80000 — AB

## 2020-07-04 MED ORDER — NITROFURANTOIN MONOHYD MACRO 100 MG PO CAPS
100.0000 mg | ORAL_CAPSULE | Freq: Two times a day (BID) | ORAL | Status: DC
  Administered 2020-07-04 – 2020-07-05 (×3): 100 mg via ORAL
  Filled 2020-07-04 (×5): qty 1

## 2020-07-04 NOTE — Progress Notes (Signed)
Inpatient Rehab Admissions Coordinator:   Notified by insurance this AM that they would like updated clinicals.  I have reached out to PT and OT to request.  Will continue to follow.   Shann Medal, PT, DPT Admissions Coordinator [phone] 07/04/20  10:43 AM

## 2020-07-04 NOTE — Progress Notes (Signed)
Subjective: NAEs o/n  Objective: Vital signs in last 24 hours: Temp:  [98.3 F (36.8 C)-99.3 F (37.4 C)] 98.7 F (37.1 C) (05/23 0724) Pulse Rate:  [65-90] 82 (05/23 0724) Resp:  [14-18] 14 (05/23 0724) BP: (95-113)/(61-68) 102/68 (05/23 0724) SpO2:  [94 %-98 %] 94 % (05/23 0724)  Intake/Output from previous day: 05/22 0701 - 05/23 0700 In: 120 [P.O.:120] Out: -  Intake/Output this shift: No intake/output data recorded.  2/5 hand grip with interossei atrophy, 4-/5 in LEs Incision c/d  Lab Results: No results for input(s): WBC, HGB, HCT, PLT in the last 72 hours. BMET No results for input(s): NA, K, CL, CO2, GLUCOSE, BUN, CREATININE, CALCIUM in the last 72 hours.  Studies/Results: No results found.  Assessment/Plan: S/p posterior cervical decompression and fusion  - cont muscle relaxants - PT/OT - awaiting CIR   Darren Harrison 07/04/2020, 8:48 AM

## 2020-07-04 NOTE — Progress Notes (Signed)
Physical Therapy Treatment Patient Details Name: Darren Harrison MRN: [id number] DOB: 09-04-1955 Today's Date: 07/04/2020    History of Present Illness The pt is a 65 yo male presenting 5/18 for C3-4 laminectomy, and posterior instrumentation due to ongoing cervical myelopathy and quadraparesis. PMH includes: prior cervical surgeries and fusions (C4-6 ACDF), and lumbar surgery (L3-5), R elbow fx and surgery, R ulnnar nerve injury.    PT Comments    Continuing work on functional mobility and activity tolerance;  Session focused on bed mobility and functional transfers, with emphasis on trunk control and weight shifting; Noteworthy that pt is better able to direct his care today as well; Small, but notable progress, and very good participation despite pain/spasm in back; still with difficulty standing due to LE weakness and decr trunk control -- but, he is better able to tolerate sitting in various postures with mod assist for support, and these are the building blocks of functional transfers; Progress towards trunk stability and balance in sitting will still unlock Darren Harrison's ability to move and care for himself more independently at a wheelchair transfer level.   Follow Up Recommendations  CIR     Equipment Recommendations  None recommended by PT    Recommendations for Other Services       Precautions / Restrictions Precautions Precautions: Cervical;Fall Precaution Comments: reviewed precautions Required Braces or Orthoses: Cervical Brace Cervical Brace: Hard collar;Other (comment) (ok to remove in bed and to/from bathroom)    Mobility  Bed Mobility Overal bed mobility: Needs Assistance Bed Mobility: Rolling;Sidelying to Sit Rolling: Mod assist Sidelying to sit: Max assist;HOB elevated       General bed mobility comments: Donned the cervical collar while pt's head and neck were supported in bed to see if it was helpful for pain control with transition from sidelying to sit;  Requiring Max assist for trunk elevation    Transfers Overall transfer level: Needs assistance   Transfers: Sit to/from Stand Sit to Stand: +2 physical assistance;Max assist;+2 safety/equipment         General transfer comment: Pt better able to direct his care, having used the stedy in previous session; Cues/assist for anterior pelvic tilt, and for hand placement forward on bar; Assist to scoot pt forward for knees to contact the brace plate of stedy for stability; Good patient led initiation with anterior weight shift, Max assist of 2 and use of pad for liftoff of pelvis; Noting needs assist for trunk control, tended to heavily lean upper body forward on and over the stedy bar, and needing mod/max physical assist to come back to upright high sitting in stedy  Ambulation/Gait                 Stairs             Wheelchair Mobility    Modified Rankin (Stroke Patients Only)       Balance     Sitting balance-Leahy Scale: Poor Sitting balance - Comments: mod/max assist to maintain sitting balance EOB and in stedy Postural control: Other (comment) (Heavy anterior lean in stedy)                                  Cognition Arousal/Alertness: Awake/alert Behavior During Therapy: WFL for tasks assessed/performed Overall Cognitive Status: Within Functional Limits for tasks assessed  Exercises      General Comments General comments (skin integrity, edema, etc.): Noted pt had loose stool upon semi-standing in the stedy; assisted him back to bed for hygeine      Pertinent Vitals/Pain Faces Pain Scale: Hurts even more Pain Location: neck/shoulders with mobility Pain Descriptors / Indicators: Discomfort;Grimacing;Guarding Pain Intervention(s): Monitored during session;RN gave pain meds during session    Home Living                      Prior Function            PT Goals (current  goals can now be found in the care plan section) Acute Rehab PT Goals Patient Stated Goal: to get stronger and go home PT Goal Formulation: With patient Time For Goal Achievement: 07/14/20 (Goals were set 06/30/2020) Potential to Achieve Goals: Good    Frequency    Min 4X/week      PT Plan Current plan remains appropriate    Co-evaluation              AM-PAC PT "6 Clicks" Mobility   Outcome Measure  Help needed turning from your back to your side while in a flat bed without using bedrails?: A Lot Help needed moving from lying on your back to sitting on the side of a flat bed without using bedrails?: A Lot Help needed moving to and from a bed to a chair (including a wheelchair)?: Total Help needed standing up from a chair using your arms (e.g., wheelchair or bedside chair)?: Total Help needed to walk in hospital room?: Total Help needed climbing 3-5 steps with a railing? : Total 6 Click Score: 8    End of Session Equipment Utilized During Treatment: Gait belt;Cervical collar Activity Tolerance: Patient tolerated treatment well;Other (comment) (though noted incr spasm and pain) Patient left: in bed;with nursing/sitter in room;Other (comment) (Cleaning pt) Nurse Communication: Mobility status;Need for lift equipment PT Visit Diagnosis: Other abnormalities of gait and mobility (R26.89);Muscle weakness (generalized) (M62.81)     Time: 9407-6808 PT Time Calculation (min) (ACUTE ONLY): 23 min  Charges:  $Therapeutic Activity: 23-37 mins                     Roney Marion, [place]  Acute Rehabilitation Services Pager [phone] Office [place] 07/04/2020, 11:16 AM

## 2020-07-05 ENCOUNTER — Inpatient Hospital Stay (HOSPITAL_COMMUNITY): Payer: Managed Care, Other (non HMO)

## 2020-07-05 ENCOUNTER — Inpatient Hospital Stay (HOSPITAL_COMMUNITY)
Admission: RE | Admit: 2020-07-05 | Discharge: 2020-08-10 | DRG: 559 | Disposition: A | Discharge status: Home Health | Payer: Managed Care, Other (non HMO) | Source: Intra-hospital | Attending: Physical Medicine and Rehabilitation | Admitting: Physical Medicine and Rehabilitation

## 2020-07-05 ENCOUNTER — Other Ambulatory Visit: Payer: Self-pay

## 2020-07-05 ENCOUNTER — Encounter (HOSPITAL_COMMUNITY): Payer: Self-pay | Admitting: Physical Medicine and Rehabilitation

## 2020-07-05 DIAGNOSIS — M170 Bilateral primary osteoarthritis of knee: Secondary | ICD-10-CM | POA: Diagnosis present

## 2020-07-05 DIAGNOSIS — L8990 Pressure ulcer of unspecified site, unspecified stage: Secondary | ICD-10-CM | POA: Diagnosis present

## 2020-07-05 DIAGNOSIS — M7989 Other specified soft tissue disorders: Secondary | ICD-10-CM | POA: Diagnosis not present

## 2020-07-05 DIAGNOSIS — K760 Fatty (change of) liver, not elsewhere classified: Secondary | ICD-10-CM | POA: Diagnosis present

## 2020-07-05 DIAGNOSIS — B9620 Unspecified Escherichia coli [E. coli] as the cause of diseases classified elsewhere: Secondary | ICD-10-CM | POA: Diagnosis not present

## 2020-07-05 DIAGNOSIS — M25519 Pain in unspecified shoulder: Secondary | ICD-10-CM

## 2020-07-05 DIAGNOSIS — I951 Orthostatic hypotension: Secondary | ICD-10-CM | POA: Diagnosis not present

## 2020-07-05 DIAGNOSIS — L89893 Pressure ulcer of other site, stage 3: Secondary | ICD-10-CM | POA: Diagnosis present

## 2020-07-05 DIAGNOSIS — N319 Neuromuscular dysfunction of bladder, unspecified: Secondary | ICD-10-CM | POA: Diagnosis present

## 2020-07-05 DIAGNOSIS — K560 Paralytic ileus: Secondary | ICD-10-CM | POA: Diagnosis not present

## 2020-07-05 DIAGNOSIS — M19011 Primary osteoarthritis, right shoulder: Secondary | ICD-10-CM | POA: Diagnosis present

## 2020-07-05 DIAGNOSIS — K567 Ileus, unspecified: Secondary | ICD-10-CM

## 2020-07-05 DIAGNOSIS — G8250 Quadriplegia, unspecified: Secondary | ICD-10-CM | POA: Diagnosis present

## 2020-07-05 DIAGNOSIS — K219 Gastro-esophageal reflux disease without esophagitis: Secondary | ICD-10-CM | POA: Diagnosis present

## 2020-07-05 DIAGNOSIS — I10 Essential (primary) hypertension: Secondary | ICD-10-CM | POA: Diagnosis present

## 2020-07-05 DIAGNOSIS — K7460 Unspecified cirrhosis of liver: Secondary | ICD-10-CM | POA: Diagnosis present

## 2020-07-05 DIAGNOSIS — L89151 Pressure ulcer of sacral region, stage 1: Secondary | ICD-10-CM | POA: Diagnosis present

## 2020-07-05 DIAGNOSIS — K592 Neurogenic bowel, not elsewhere classified: Secondary | ICD-10-CM | POA: Diagnosis present

## 2020-07-05 DIAGNOSIS — Z4789 Encounter for other orthopedic aftercare: Secondary | ICD-10-CM | POA: Diagnosis present

## 2020-07-05 DIAGNOSIS — N400 Enlarged prostate without lower urinary tract symptoms: Secondary | ICD-10-CM | POA: Diagnosis present

## 2020-07-05 DIAGNOSIS — E0590 Thyrotoxicosis, unspecified without thyrotoxic crisis or storm: Secondary | ICD-10-CM | POA: Diagnosis present

## 2020-07-05 DIAGNOSIS — I4891 Unspecified atrial fibrillation: Secondary | ICD-10-CM | POA: Diagnosis present

## 2020-07-05 DIAGNOSIS — Z833 Family history of diabetes mellitus: Secondary | ICD-10-CM

## 2020-07-05 DIAGNOSIS — N390 Urinary tract infection, site not specified: Secondary | ICD-10-CM | POA: Diagnosis not present

## 2020-07-05 DIAGNOSIS — Z6372 Alcoholism and drug addiction in family: Secondary | ICD-10-CM

## 2020-07-05 DIAGNOSIS — Z800 Family history of malignant neoplasm of digestive organs: Secondary | ICD-10-CM

## 2020-07-05 DIAGNOSIS — E039 Hypothyroidism, unspecified: Secondary | ICD-10-CM | POA: Diagnosis present

## 2020-07-05 DIAGNOSIS — R112 Nausea with vomiting, unspecified: Secondary | ICD-10-CM

## 2020-07-05 DIAGNOSIS — G959 Disease of spinal cord, unspecified: Secondary | ICD-10-CM | POA: Diagnosis present

## 2020-07-05 DIAGNOSIS — R296 Repeated falls: Secondary | ICD-10-CM | POA: Diagnosis present

## 2020-07-05 DIAGNOSIS — Z8249 Family history of ischemic heart disease and other diseases of the circulatory system: Secondary | ICD-10-CM

## 2020-07-05 DIAGNOSIS — Z981 Arthrodesis status: Secondary | ICD-10-CM

## 2020-07-05 DIAGNOSIS — Z811 Family history of alcohol abuse and dependence: Secondary | ICD-10-CM

## 2020-07-05 DIAGNOSIS — Z8349 Family history of other endocrine, nutritional and metabolic diseases: Secondary | ICD-10-CM

## 2020-07-05 DIAGNOSIS — L89152 Pressure ulcer of sacral region, stage 2: Secondary | ICD-10-CM | POA: Diagnosis not present

## 2020-07-05 DIAGNOSIS — E0500 Thyrotoxicosis with diffuse goiter without thyrotoxic crisis or storm: Secondary | ICD-10-CM | POA: Diagnosis not present

## 2020-07-05 DIAGNOSIS — R252 Cramp and spasm: Secondary | ICD-10-CM | POA: Diagnosis not present

## 2020-07-05 MED ORDER — ADULT MULTIVITAMIN W/MINERALS CH
1.0000 | ORAL_TABLET | Freq: Every day | ORAL | Status: DC
  Administered 2020-07-06 – 2020-08-10 (×36): 1 via ORAL
  Filled 2020-07-05 (×36): qty 1

## 2020-07-05 MED ORDER — ACETAMINOPHEN 650 MG RE SUPP
650.0000 mg | RECTAL | Status: DC | PRN
  Filled 2020-07-05: qty 1

## 2020-07-05 MED ORDER — LIDOCAINE HCL URETHRAL/MUCOSAL 2 % EX GEL: 1.0000 "application " | CUTANEOUS | Status: DC | PRN

## 2020-07-05 MED ORDER — OXYCODONE HCL 5 MG PO TABS: 5.0000 mg | ORAL_TABLET | ORAL | 0 refills | Status: DC | PRN

## 2020-07-05 MED ORDER — ENOXAPARIN SODIUM 40 MG/0.4ML IJ SOSY
40.0000 mg | PREFILLED_SYRINGE | INTRAMUSCULAR | Status: DC
  Administered 2020-07-06 – 2020-08-03 (×29): 40 mg via SUBCUTANEOUS
  Filled 2020-07-05 (×30): qty 0.4

## 2020-07-05 MED ORDER — BISACODYL 5 MG PO TBEC
5.0000 mg | DELAYED_RELEASE_TABLET | Freq: Every day | ORAL | Status: DC
  Administered 2020-07-06 – 2020-07-12 (×7): 5 mg via ORAL
  Filled 2020-07-05 (×7): qty 1

## 2020-07-05 MED ORDER — DICYCLOMINE HCL 10 MG PO CAPS
10.0000 mg | ORAL_CAPSULE | Freq: Two times a day (BID) | ORAL | Status: DC | PRN
  Filled 2020-07-05: qty 1

## 2020-07-05 MED ORDER — BACLOFEN 5 MG PO TABS: 5.0000 mg | ORAL_TABLET | Freq: Three times a day (TID) | ORAL | 0 refills | Status: DC

## 2020-07-05 MED ORDER — ZINC SULFATE 220 (50 ZN) MG PO CAPS
220.0000 mg | ORAL_CAPSULE | Freq: Every day | ORAL | Status: DC
  Administered 2020-07-06 – 2020-08-10 (×36): 220 mg via ORAL
  Filled 2020-07-05 (×36): qty 1

## 2020-07-05 MED ORDER — OXYCODONE HCL 5 MG PO TABS
10.0000 mg | ORAL_TABLET | ORAL | Status: DC | PRN
  Administered 2020-07-14: 10 mg via ORAL
  Filled 2020-07-05 (×3): qty 2

## 2020-07-05 MED ORDER — ONDANSETRON HCL 4 MG/2ML IJ SOLN
4.0000 mg | Freq: Four times a day (QID) | INTRAMUSCULAR | Status: DC | PRN
  Administered 2020-07-08 – 2020-07-09 (×3): 4 mg via INTRAVENOUS
  Filled 2020-07-05 (×3): qty 2

## 2020-07-05 MED ORDER — ACETAMINOPHEN 325 MG PO TABS
650.0000 mg | ORAL_TABLET | ORAL | Status: DC | PRN
  Administered 2020-07-05 – 2020-08-05 (×13): 650 mg via ORAL
  Filled 2020-07-05 (×14): qty 2

## 2020-07-05 MED ORDER — TAMSULOSIN HCL 0.4 MG PO CAPS
0.8000 mg | ORAL_CAPSULE | Freq: Every day | ORAL | Status: DC
  Administered 2020-07-06 – 2020-08-09 (×35): 0.8 mg via ORAL
  Filled 2020-07-05 (×36): qty 2

## 2020-07-05 MED ORDER — PANTOPRAZOLE SODIUM 40 MG PO TBEC
40.0000 mg | DELAYED_RELEASE_TABLET | Freq: Every day | ORAL | Status: DC
  Administered 2020-07-05 – 2020-07-25 (×21): 40 mg via ORAL
  Filled 2020-07-05 (×21): qty 1

## 2020-07-05 MED ORDER — DOCUSATE SODIUM 100 MG PO CAPS
100.0000 mg | ORAL_CAPSULE | Freq: Two times a day (BID) | ORAL | Status: DC
  Administered 2020-07-05 – 2020-07-11 (×12): 100 mg via ORAL
  Filled 2020-07-05 (×12): qty 1

## 2020-07-05 MED ORDER — PREDNISONE 10 MG PO TABS
5.0000 mg | ORAL_TABLET | Freq: Every day | ORAL | Status: DC
  Administered 2020-07-06 – 2020-07-19 (×14): 5 mg via ORAL
  Filled 2020-07-05 (×15): qty 1

## 2020-07-05 MED ORDER — ONDANSETRON HCL 4 MG PO TABS
4.0000 mg | ORAL_TABLET | Freq: Four times a day (QID) | ORAL | Status: DC | PRN
  Administered 2020-07-08: 4 mg via ORAL
  Filled 2020-07-05 (×3): qty 1

## 2020-07-05 MED ORDER — BISACODYL 10 MG RE SUPP
10.0000 mg | Freq: Every day | RECTAL | Status: DC
  Administered 2020-07-06 – 2020-08-09 (×33): 10 mg via RECTAL
  Filled 2020-07-05 (×35): qty 1

## 2020-07-05 MED ORDER — CYCLOBENZAPRINE HCL 10 MG PO TABS: 10.0000 mg | ORAL_TABLET | Freq: Three times a day (TID) | ORAL | 0 refills | Status: DC | PRN

## 2020-07-05 MED ORDER — BACLOFEN 5 MG HALF TABLET: 5.0000 mg | ORAL_TABLET | Freq: Three times a day (TID) | ORAL | Status: DC

## 2020-07-05 MED ORDER — TAMSULOSIN HCL 0.4 MG PO CAPS: 0.4000 mg | ORAL_CAPSULE | Freq: Every day | ORAL | Status: DC

## 2020-07-05 MED ORDER — OXYCODONE HCL 5 MG PO TABS
5.0000 mg | ORAL_TABLET | ORAL | Status: DC | PRN
  Filled 2020-07-05: qty 1

## 2020-07-05 MED ORDER — NITROFURANTOIN MONOHYD MACRO 100 MG PO CAPS
100.0000 mg | ORAL_CAPSULE | Freq: Two times a day (BID) | ORAL | Status: AC
  Administered 2020-07-05 – 2020-07-09 (×8): 100 mg via ORAL
  Filled 2020-07-05 (×8): qty 1

## 2020-07-05 MED ORDER — LOSARTAN POTASSIUM 50 MG PO TABS
50.0000 mg | ORAL_TABLET | Freq: Every day | ORAL | Status: DC
  Administered 2020-07-06 – 2020-07-19 (×14): 50 mg via ORAL
  Filled 2020-07-05 (×15): qty 1

## 2020-07-05 MED ORDER — MUPIROCIN 2 % EX OINT
1.0000 "application " | TOPICAL_OINTMENT | Freq: Two times a day (BID) | CUTANEOUS | Status: AC
  Administered 2020-07-05 – 2020-07-07 (×3): 1 via NASAL
  Filled 2020-07-05: qty 22

## 2020-07-05 MED ORDER — METOPROLOL TARTRATE 25 MG PO TABS
25.0000 mg | ORAL_TABLET | Freq: Every day | ORAL | Status: DC
  Administered 2020-07-06 – 2020-07-11 (×5): 25 mg via ORAL
  Filled 2020-07-05 (×7): qty 1

## 2020-07-05 MED ORDER — NITROFURANTOIN MONOHYD MACRO 100 MG PO CAPS: 100.0000 mg | ORAL_CAPSULE | Freq: Two times a day (BID) | ORAL | 0 refills | Status: DC

## 2020-07-05 MED ORDER — BACLOFEN 10 MG PO TABS
10.0000 mg | ORAL_TABLET | Freq: Three times a day (TID) | ORAL | Status: DC
  Administered 2020-07-05 – 2020-07-08 (×9): 10 mg via ORAL
  Filled 2020-07-05 (×9): qty 1

## 2020-07-05 MED ORDER — CYCLOBENZAPRINE HCL 10 MG PO TABS
10.0000 mg | ORAL_TABLET | Freq: Three times a day (TID) | ORAL | Status: DC | PRN
  Administered 2020-07-05: 10 mg via ORAL
  Filled 2020-07-05: qty 1

## 2020-07-05 MED ORDER — DOCUSATE SODIUM 100 MG PO CAPS: 100.0000 mg | ORAL_CAPSULE | Freq: Two times a day (BID) | ORAL | 0 refills | Status: DC

## 2020-07-05 NOTE — Progress Notes (Signed)
BLE venous duplex has been completed.  Results can be found under chart review under CV PROC. 07/05/2020 4:30 PM Darcell Yacoub RVT, RDMS

## 2020-07-05 NOTE — Discharge Instructions (Addendum)
Inpatient Rehab Discharge Instructions  Darren Harrison Discharge date and time: No discharge date for patient encounter.   Activities/Precautions/ Functional Status: Activity: Cervical brace as directed Diet: Regular Wound Care: Routine skin checks Functional status:  ___ No restrictions     ___ Walk up steps independently ___ 24/7 supervision/assistance   ___ Walk up steps with assistance ___ Intermittent supervision/assistance  ___ Bathe/dress independently ___ Walk with walker     _x__ Bathe/dress with assistance ___ Walk Independently    ___ Shower independently ___ Walk with assistance    ___ Shower with assistance ___ No alcohol     ___ Return to work/school ________  COMMUNITY REFERRALS UPON DISCHARGE:    Outpatient: PT     OT                             Agency:[facility] Phone: [phone]             Appointment Date/Time:*Referral sent by Dr. Arnoldo Morale office on 6/23 (neurosurgeon's office)*  Medical Equipment/Items Ordered: loaner w/c                                                 Agency/Supplier:[employer]    Special Instructions: No driving smoking or alcohol     My questions have been answered and I understand these instructions. I will adhere to these goals and the provided educational materials after my discharge from the hospital.  Patient/Caregiver Signature _______________________________ Date __________  Clinician Signature _______________________________________ Date __________  Please bring this form and your medication list with you to all your follow-up doctor's appointments.     ===================================  Information on my medicine - XARELTO (Rivaroxaban)  This medication education was reviewed with me or my healthcare representative as part of my discharge preparation.   Why was Xarelto prescribed for you? Xarelto was prescribed for you to reduce the risk of a blood clot forming that can cause a stroke if you have a  medical condition called atrial fibrillation (a type of irregular heartbeat).  What do you need to know about xarelto ? Take your Xarelto ONCE DAILY at the same time every day with your evening meal. If you have difficulty swallowing the tablet whole, you may crush it and mix in applesauce just prior to taking your dose.  Take Xarelto exactly as prescribed by your doctor and DO NOT stop taking Xarelto without talking to the doctor who prescribed the medication.  Stopping without other stroke prevention medication to take the place of Xarelto may increase your risk of developing a clot that causes a stroke.  Refill your prescription before you run out.  After discharge, you should have regular check-up appointments with your healthcare provider that is prescribing your Xarelto.  In the future your dose may need to be changed if your kidney function or weight changes by a significant amount.  What do you do if you miss a dose? If you are taking Xarelto ONCE DAILY and you miss a dose, take it as soon as you remember on the same day then continue your regularly scheduled once daily regimen the next day. Do not take two doses of Xarelto at the same time or on the same day.   Important Safety Information A possible side effect of Xarelto is bleeding. You should call  your healthcare provider right away if you experience any of the following: Bleeding from an injury or your nose that does not stop. Unusual colored urine (red or dark brown) or unusual colored stools (red or black). Unusual bruising for unknown reasons. A serious fall or if you hit your head (even if there is no bleeding).  Some medicines may interact with Xarelto and might increase your risk of bleeding while on Xarelto. To help avoid this, consult your healthcare provider or pharmacist prior to using any new prescription or non-prescription medications, including herbals, vitamins, non-steroidal anti-inflammatory drugs  (NSAIDs) and supplements.  This website has more information on Xarelto: [email or web address]

## 2020-07-05 NOTE — Discharge Instructions (Signed)
Wound Care Keep incision covered and dry for two days.    Do not put any creams, lotions, or ointments on incision. Leave steri-strips on back.  They will fall off by themselves.  Activity Walk each and every day, increasing distance each day. No lifting greater than 5 lbs.  Avoid excessive neck motion. No driving for 2 weeks; may ride as a passenger locally.  Diet Resume your normal diet.   Return to Work Will be discussed at your follow up appointment.  Call Your Doctor If Any of These Occur Redness, drainage, or swelling at the wound.  Temperature greater than 101 degrees. Severe pain not relieved by pain medication. Incision starts to come apart.  Follow Up Appt Call today for appointment in 1-2 weeks [phone]) or for problems.  If you have any hardware placed in your spine, you will need an x-ray before your appointment.
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
## 2020-07-05 NOTE — Progress Notes (Signed)
Subjective: Patient reports right arm strength and numbness is improved but still has muscle spasms  Objective: Vital signs in last 24 hours: Temp:  [98.3 F (36.8 C)-98.7 F (37.1 C)] 98.3 F (36.8 C) (05/24 0711) Pulse Rate:  [65-84] 71 (05/24 0711) Resp:  [17-20] 17 (05/24 0711) BP: (101-110)/(59-75) 107/75 (05/24 0711) SpO2:  [95 %-97 %] 96 % (05/24 0711)  Intake/Output from previous day: 05/23 0701 - 05/24 0700 In: 240 [P.O.:240] Out: -  Intake/Output this shift: No intake/output data recorded.  Incision c/d Appears more comfortable. 2-3/5 HG, 4/5 D, B  Lab Results: No results for input(s): WBC, HGB, HCT, PLT in the last 72 hours. BMET No results for input(s): NA, K, CL, CO2, GLUCOSE, BUN, CREATININE, CALCIUM in the last 72 hours.  Studies/Results: No results found.  Assessment/Plan: S/p PCDF - awaiting CIR admission - cont PT/OT - DVT ppx   Vallarie Mare 07/05/2020, 10:12 AM

## 2020-07-05 NOTE — Progress Notes (Signed)
Inpatient Rehab Admissions Coordinator:    I have insurance approval and a bed available for pt to admit to CIR today. Viona Gilmore, NP, in agreement.  Will let pt/family and TOC team know.   Shann Medal, PT, DPT Admissions Coordinator [phone] 07/05/20  10:09 AM

## 2020-07-05 NOTE — Progress Notes (Signed)
Patient ID: Darren Harrison, male   DOB: 26-Mar-1955, 65 y.o.   MRN: [id number] Met with the patient and wife to introduce role of the nurse CM Erline Levine) and initiate education. Reviewed rationale for airmattress given quadraparesis and stage 2 left buttocks on admission. Patient concerned about spasm management given fatty liver disease. Dr. Myrtie Neither familiar with meds and liver issues and will prescribe appropriately. Patient given handouts of dietary modifications for fatty liver and pressure relief measures. Continue to follow along to discharge to address educational needs including bowel program and collaborate with the SW to facilitate preparation for discharge. Margarito Liner

## 2020-07-05 NOTE — Progress Notes (Signed)
INPATIENT REHABILITATION ADMISSION NOTE   Arrival Method: bed     Mental Orientation: A&Ox4   Assessment: done   Skin: done   IV'S: 1 present   Pain: none at rest. Spasms with movement   Tubes and Drains: none   Safety Measures: in place   Vital Signs: done   Height and Weight: done   Rehab Orientation: oriented to floor, rehab policy and call bell   Family: at bedside.    Notes: done

## 2020-07-05 NOTE — Plan of Care (Signed)
  Problem: Activity: Goal: Risk for activity intolerance will decrease Outcome: Progressing   Problem: Elimination: Goal: Will not experience complications related to bowel motility Outcome: Completed/Met   Problem: Pain Managment: Goal: General experience of comfort will improve Outcome: Progressing   

## 2020-07-05 NOTE — Discharge Summary (Addendum)
Physician Discharge Summary     Providing Compassionate, Quality Care - Together   Patient ID: Darren Harrison MRN: [id number] DOB/AGE: May 04, 1955 65 y.o.  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 He underwent an urgent C3-4 posterior cervical fusion by Dr. Arnoldo Morale on 06/29/2020. He has done well postoperatively. He was having issues with urinary retention prior to admission and had a urinary tract infection that showed sensitivity to nitrofurantoin. His foley catheter was removed on 07/01/2020. He has worked with both physical and occupational therapies who feel the patient would benefit from CIR. He is tolerating a normal diet. He is not having any issues with bowel dysfunction. His pain is well-controlled with oral pain medication. He is ready for discharge to CIR.   Consults: rehabilitation medicine  Significant Diagnostic Studies: radiology: DG Cervical Spine 1 View  Result Date: 06/29/2020 CLINICAL DATA:  Cervical fusion EXAM: DG C-ARM 1-60 MIN; DG CERVICAL SPINE - 1 VIEW COMPARISON:  None. FLUOROSCOPY TIME:  Fluoroscopy Time:  12 seconds Radiation Exposure Index (if provided by the fluoroscopic device): 2.8 mGy Number of Acquired Spot Images: 1 FINDINGS: Lateral radiograph of the cervical spine reveals fusion at C3-4 posteriorly. Changes of prior fusion at C4-5 anteriorly are noted as well. IMPRESSION: Status post posterior fusion at C3-4. Electronically Signed   By: Darren Harrison M.D.   On: 06/29/2020 15:18   MR CERVICAL SPINE WO CONTRAST  Result Date: 06/29/2020 CLINICAL DATA:  Cervical radiculopathy. Bilateral arm weakness and  difficulty with ambulation. Prior cervical spine fusion 2017 EXAM: MRI CERVICAL SPINE WITHOUT CONTRAST TECHNIQUE: Multiplanar, multisequence MR imaging of the cervical spine was performed. No intravenous contrast was administered. COMPARISON:  MRI cervical spine 03/22/2015 FINDINGS: Alignment: Mild anterolisthesis C3-4. Straightening of the cervical lordosis. Vertebrae: ACDF C4-5 and C5-6.  Negative for fracture or mass. Cord: Moderately large area of ill-defined cord hyperintensity at C3-4 with associated spinal stenosis and cord compression. This was not present previously. Small area of well-defined hyperintensity in the cord on the right at C4-5 compatible with chronic myelomalacia. Posterior Fossa, vertebral arteries, paraspinal tissues: Negative for paraspinous mass or fluid collection. Disc levels: C2-3: Moderate right foraminal encroachment due to severe facet hypertrophy on the right. Mild left foraminal encroachment due to facet hypertrophy C3-4: Disc degeneration with diffuse uncinate spurring which has progressed in the interval. In addition, there is a 9 x 6 mm posterior epidural mass on the right which has mixed high and low signal on T2. This appears to be associated with the right facet joint which is severely degenerated. Probable synovial cyst contributing to cord compression. There is severe foraminal encroachment bilaterally due to spurring. C4-5: ACDF. Bilateral facet degeneration. Moderate foraminal narrowing bilaterally. C5-6: ACDF. Bilateral facet hypertrophy. Moderate foraminal narrowing bilaterally. C6-7: Disc degeneration with diffuse uncinate spurring left greater than right. Severe facet degeneration bilaterally. Severe foraminal encroachment bilaterally. Mild spinal stenosis C7-T1: Bilateral facet degeneration with moderate foraminal narrowing bilaterally. IMPRESSION: Severe multilevel cervical spondylosis. ACDF C4-5 and C5-6 with moderate foraminal narrowing bilaterally due to spurring  Severe spinal stenosis with cord compression and cord hyperintensity at C3-4. There is progressive spondylosis at this level as well as a 6 x  9 mm posterior epidural mass on the right which is likely a synovial cyst. Severe facet degeneration on the right. Severe foraminal encroachment bilaterally at C6-7. Moderate foraminal encroachment bilaterally at C7-T1 These results were called by telephone at the time of interpretation on 06/29/2020 at 8:50 am to provider [facility] , who verbally acknowledged these results. Electronically Signed   By: Darren Harrison M.D.   On: 06/29/2020 08:51   MR Lumbar Spine W Wo Contrast  Result Date: 06/29/2020 CLINICAL DATA:  Lumbar radiculopathy. Prior back surgery. Difficulty ambulating. EXAM: MRI LUMBAR SPINE WITHOUT AND WITH CONTRAST TECHNIQUE: Multiplanar and multiecho pulse sequences of the lumbar spine were obtained without and with intravenous contrast. CONTRAST:  7.48mL GADAVIST GADOBUTROL 1 MMOL/ML IV SOLN COMPARISON:  Lumbar MRI 03/01/2015 FINDINGS: Segmentation:  Normal Alignment: Mild retrolisthesis L1-2, L2-3, L3-4. 5 mm anterolisthesis L4-5. Moderate levoscoliosis. Vertebrae: Negative for fracture or mass. Discogenic edema at T12-L1 and L2-3 on the right due to disc degeneration. No evidence of spinal infection. Conus medullaris and cauda equina: Conus extends to the L1-2 level. Conus and cauda equina appear normal. Paraspinal and other soft tissues: Negative for paraspinous mass, adenopathy, fluid collection Urinary bladder is markedly distended compatible with urinary retention. Disc levels: T12-L1: Moderate disc degeneration with disc space narrowing and diffuse endplate spurring. Subarticular stenosis bilaterally left greater than right. Mild spinal stenosis L1-2: Disc degeneration with diffuse endplate spurring and bilateral facet degeneration. Mild to moderate spinal stenosis. Moderate subarticular stenosis bilaterally left greater than right L2-3: Disc  degeneration with diffuse endplate spurring. Posterior decompression with adequate decompression of spinal canal. There is severe subarticular and foraminal stenosis bilaterally due to spurring with impingement of the L2 and L3 nerve root bilaterally. L3-4: Bilateral laminectomy with adequate decompression of the spinal canal. Bilateral facet hypertrophy. Disc degeneration with diffuse endplate spurring. Severe subarticular and foraminal stenosis bilaterally with impingement of the L3 and L4 nerve roots bilaterally. L4-5: 5 mm anterolisthesis. Severe disc degeneration. Severe facet degeneration and facet hypertrophy bilaterally. Possible synovial cyst on the right. Severe subarticular and foraminal stenosis bilaterally with bilateral L4 and L5 nerve root impingement. Moderate spinal stenosis. L5-S1: Disc degeneration and spurring asymmetric to the left. Bilateral facet degeneration. Moderate subarticular and foraminal stenosis bilaterally left greater than right. IMPRESSION: 1. Marked enlargement of the urinary bladder compatible with urinary retention 2. Severe multilevel degenerative change in the lumbar spine causing spinal and foraminal stenosis as above. Severe subarticular and foraminal stenosis present at multiple levels in the lumbar spine. 3. These results were called by telephone at the time of interpretation on 06/29/2020 at 8:49 am to provider [facility] , who verbally acknowledged these results. Electronically Signed   By: Darren Harrison M.D.   On: 06/29/2020 08:50    DG C-Arm 1-60 Min  Result Date: 06/29/2020 CLINICAL DATA:  Cervical fusion EXAM: DG C-ARM 1-60 MIN; DG CERVICAL SPINE - 1 VIEW COMPARISON:  None. FLUOROSCOPY TIME:  Fluoroscopy Time:  12 seconds Radiation Exposure Index (if provided by the fluoroscopic device): 2.8 mGy Number of Acquired Spot Images: 1 FINDINGS: Lateral radiograph of the cervical spine reveals fusion at C3-4 posteriorly. Changes of prior fusion at C4-5 anteriorly  are noted as well. IMPRESSION: Status post posterior fusion at C3-4. Electronically Signed   By: Darren Harrison M.D.   On: 06/29/2020 15:18      Treatments: surgery: C3 and C4 laminectomy for resection of cervical synovial cyst using microdissection; C3-4 posterior arthrodesis with local morselized autograft bone and  intra grow bone graft extender; C3-4 posterior cervical instrumentation with Zimmer titanium lateral mass screws and rods  Discharge Exam: Blood pressure 107/75, pulse 71, temperature 98.3 F (36.8 C), temperature source Oral, resp. rate 17, height 5\' 10"  (1.778 m), weight 79.4 kg, SpO2 96 %.   Alert and oriented x 4 PERRLA CN II-XII grossly intact MAE, decreased strength and sensation in BUE, BLE Incision is covered with dressing; Dressing is clean, dry, and intact   Disposition: Discharge disposition: [place] Not Defined        Allergies as of 07/05/2020   No Known Allergies     Medication List    TAKE these medications   acetaminophen 500 MG tablet Commonly known as: TYLENOL Take 1,000 mg by mouth every 8 (eight) hours as needed for moderate pain.   AMINO ACID PO Take 1 capsule by mouth 3 (three) times daily.   Baclofen 5 MG Tabs Take 5 mg by mouth 3 (three) times daily.   CENTRUM ADULTS PO Take 1 tablet by mouth daily.   cyclobenzaprine 10 MG tablet Commonly known as: FLEXERIL Take 1 tablet (10 mg total) by mouth 3 (three) times daily as needed for muscle spasms.   docusate sodium 100 MG capsule Commonly known as: COLACE Take 1 capsule (100 mg total) by mouth 2 (two) times daily.   losartan 50 MG tablet Commonly known as: COZAAR Take 50 mg by mouth daily.   metoprolol tartrate 25 MG tablet Commonly known as: LOPRESSOR Take 25 mg by mouth daily with breakfast.   nitrofurantoin (macrocrystal-monohydrate) 100 MG capsule Commonly known as: MACROBID Take 1 capsule (100 mg total) by mouth every 12 (twelve) hours for 4  days.   oxyCODONE 5 MG immediate release tablet Commonly known as: Oxy IR/ROXICODONE Take 1 tablet (5 mg total) by mouth every 3 (three) hours as needed for moderate pain ((score 4 to 6)).   predniSONE 5 MG tablet Commonly known as: DELTASONE Take 1 tablet (5 mg total) by mouth daily with breakfast.   tamsulosin 0.4 MG Caps capsule Commonly known as: FLOMAX Take 0.4 mg by mouth daily.   Zinc 50 MG Caps Take 50 mg by mouth daily.       Follow-up Information    Newman Pies, MD. Schedule an appointment as soon as possible for a visit in 3 week(s).   Specialty: Neurosurgery Contact information: [address] [place] [address] [phone]               Signed: Viona Gilmore, DNP, AGNP-C Nurse Practitioner  [facility] [place] [address], [place], [place] [address] P: [phone]    F: [phone]  07/05/2020, 10:22 AM
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
## 2020-07-05 NOTE — Progress Notes (Signed)
Orthopedic Tech Progress Note Patient Details:  Darren Harrison Jan 20, 1956 [id number] Called order into [facility] Patient ID: Darren Harrison, male   DOB: 26-Aug-1955, 65 y.o.   MRN: [id number]   Darren Harrison 07/05/2020, 9:17 PM

## 2020-07-06 DIAGNOSIS — G959 Disease of spinal cord, unspecified: Secondary | ICD-10-CM | POA: Diagnosis not present

## 2020-07-06 LAB — COMPREHENSIVE METABOLIC PANEL
ALT: 21 U/L (ref 0–44)
AST: 19 U/L (ref 15–41)
Albumin: 2.8 g/dL — ABNORMAL LOW (ref 3.5–5.0)
Alkaline Phosphatase: 161 U/L — ABNORMAL HIGH (ref 38–126)
Anion gap: 7 (ref 5–15)
BUN: 16 mg/dL (ref 8–23)
CO2: 25 mmol/L (ref 22–32)
Calcium: 8.8 mg/dL — ABNORMAL LOW (ref 8.9–10.3)
Chloride: 102 mmol/L (ref 98–111)
Creatinine, Ser: 0.59 mg/dL — ABNORMAL LOW (ref 0.61–1.24)
GFR, Estimated: >60 mL/min (ref >60)
Glucose, Bld: 100 mg/dL — ABNORMAL HIGH (ref 70–99)
Potassium: 4 mmol/L (ref 3.5–5.1)
Sodium: 134 mmol/L — ABNORMAL LOW (ref 135–145)
Total Bilirubin: 0.9 mg/dL (ref 0.3–1.2)
Total Protein: 5.7 g/dL — ABNORMAL LOW (ref 6.5–8.1)

## 2020-07-06 LAB — CBC WITH DIFFERENTIAL/PLATELET
Abs Immature Granulocytes: 0.01 10*3/uL (ref 0.00–0.07)
Basophils Absolute: 0 10*3/uL (ref 0.0–0.1)
Basophils Relative: 0 %
Eosinophils Absolute: 0.2 10*3/uL (ref 0.0–0.5)
Eosinophils Relative: 3 %
HCT: 35.5 % — ABNORMAL LOW (ref 39.0–52.0)
Hemoglobin: 11.8 g/dL — ABNORMAL LOW (ref 13.0–17.0)
Immature Granulocytes: 0 %
Lymphocytes Relative: 30 %
Lymphs Abs: 1.5 10*3/uL (ref 0.7–4.0)
MCH: 29.1 pg (ref 26.0–34.0)
MCHC: 33.2 g/dL (ref 30.0–36.0)
MCV: 87.4 fL (ref 80.0–100.0)
Monocytes Absolute: 0.5 10*3/uL (ref 0.1–1.0)
Monocytes Relative: 9 %
Neutro Abs: 2.9 10*3/uL (ref 1.7–7.7)
Neutrophils Relative %: 58 %
Platelets: 166 10*3/uL (ref 150–400)
RBC: 4.06 MIL/uL — ABNORMAL LOW (ref 4.22–5.81)
RDW: 15.3 % (ref 11.5–15.5)
WBC: 5 10*3/uL (ref 4.0–10.5)
nRBC: 0 % (ref 0.0–0.2)

## 2020-07-06 NOTE — Progress Notes (Signed)
Physical Therapy Session Note  Patient Details  Name: Darren Harrison MRN: [id number] Date of Birth: 1955/02/18  Today's Date: 07/06/2020 PT Individual Time: 9242-6834 PT Individual Time Calculation (min): 45 min   Short Term Goals: Week 1:  PT Short Term Goal 1 (Week 1): Pt will complete bed mobility with assist x 1 consistently PT Short Term Goal 2 (Week 1): Pt will initiate slide board transfers PT Short Term Goal 3 (Week 1): Pt will maintain sitting balance x 5 min with min A  Skilled Therapeutic Interventions/Progress Updates:  Pt received supine in bed, requesting bedpan. Rolling L/R with mod A for removal of clothing and placement of bedpan. Pt able to continently void on bedpan but has already voided BM in brief. Pt is dependent for pericare and donning of new brief and pants. Supine to sit with max A x 2. Pt able to tolerate sitting EOB x 5 min with max A at most, CGA at the least. Pt utilizes LUE support holding onto arm of chair to maintain sitting. Without UE support pt leans laterally to the R. Pt has onset of pain between shoulder blades in seated position that subsides at rest. Pt returned to supine due to pain. Sit to supine assist x 2 for trunk control and BLE management. Pt left seated in bed with needs in reach at end of session.  Therapy Documentation Precautions:  Precautions Precautions: Cervical,Fall Precaution Comments: reviewed precautions Required Braces or Orthoses: Cervical Brace Cervical Brace: Hard collar,Other (comment) (donned in sitting) Restrictions Weight Bearing Restrictions: No   Therapy/Group: Individual Therapy   [name], PT, DPT, CSRS  07/06/2020, 3:35 PM

## 2020-07-06 NOTE — Progress Notes (Signed)
PROGRESS NOTE   Subjective/Complaints:  Pt reports spasms somewhat further apart and slightly less intense- doesn't think flexeril helps at all.  Did call for bedpan- was able to go after got bedpan, but also went before.   Is thirsty this AM  ROS:  Pt denies SOB, abd pain, CP, N/V/C/D, and vision changes    Objective:   VAS [place] LOWER EXTREMITY VENOUS (DVT)  Result Date: 07/05/2020  Lower Venous DVT Study Patient Name:  Darren Harrison  Date of Exam:   07/05/2020 Medical Rec #: [id number]         Accession #:    [id number] Date of Birth: 01/25/1956          Patient Gender: M Patient Age:   37Y Exam Location:  [facility] Procedure:      VAS [place] LOWER EXTREMITY VENOUS (DVT) Referring Phys: [place] [facility] --------------------------------------------------------------------------------  Indications: Swelling.  Risk Factors: Immobility Surgery Recent back surgery. Comparison Study: No previous exams Performing Technologist: Rogelia Rohrer  Examination Guidelines: A complete evaluation includes B-mode imaging, spectral Doppler, color Doppler, and power Doppler as needed of all accessible portions of each vessel. Bilateral testing is considered an integral part of a complete examination. Limited examinations for reoccurring indications may be performed as noted. The reflux portion of the exam is performed with the patient in reverse Trendelenburg.  +---------+---------------+---------+-----------+----------+--------------+ RIGHT    CompressibilityPhasicitySpontaneityPropertiesThrombus Aging +---------+---------------+---------+-----------+----------+--------------+ CFV      Full           Yes      Yes                                 +---------+---------------+---------+-----------+----------+--------------+ SFJ      Full                                                         +---------+---------------+---------+-----------+----------+--------------+ FV Prox  Full           Yes      Yes                                 +---------+---------------+---------+-----------+----------+--------------+ FV Mid   Full           Yes      Yes                                 +---------+---------------+---------+-----------+----------+--------------+ FV DistalFull           Yes      Yes                                 +---------+---------------+---------+-----------+----------+--------------+ PFV      Full                                                        +---------+---------------+---------+-----------+----------+--------------+  POP      Full           Yes      Yes                                 +---------+---------------+---------+-----------+----------+--------------+ PTV      Full                                                        +---------+---------------+---------+-----------+----------+--------------+ PERO     Full                                                        +---------+---------------+---------+-----------+----------+--------------+   +---------+---------------+---------+-----------+----------+--------------+ LEFT     CompressibilityPhasicitySpontaneityPropertiesThrombus Aging +---------+---------------+---------+-----------+----------+--------------+ CFV      Full           Yes      Yes                                 +---------+---------------+---------+-----------+----------+--------------+ SFJ      Full                                                        +---------+---------------+---------+-----------+----------+--------------+ FV Prox  Full           Yes      Yes                                 +---------+---------------+---------+-----------+----------+--------------+ FV Mid   Full           Yes      Yes                                  +---------+---------------+---------+-----------+----------+--------------+ FV DistalFull           Yes      Yes                                 +---------+---------------+---------+-----------+----------+--------------+ PFV      Full                                                        +---------+---------------+---------+-----------+----------+--------------+ POP      Full           Yes      Yes                                 +---------+---------------+---------+-----------+----------+--------------+ PTV  Full                                                        +---------+---------------+---------+-----------+----------+--------------+ PERO     Full                                                        +---------+---------------+---------+-----------+----------+--------------+     Summary: BILATERAL: - No evidence of deep vein thrombosis seen in the lower extremities, bilaterally. - No evidence of superficial venous thrombosis in the lower extremities, bilaterally. -No evidence of popliteal cyst, bilaterally.   *See table(s) above for measurements and observations. Electronically signed by Deitra Mayo MD on 07/05/2020 at 5:35:03 PM.    Final    Recent Labs    07/06/20 0412  WBC 5.0  HGB 11.8*  HCT 35.5*  PLT 166   Recent Labs    07/06/20 0412  NA 134*  K 4.0  CL 102  CO2 25  GLUCOSE 100*  BUN 16  CREATININE 0.59*  CALCIUM 8.8*    Intake/Output Summary (Last 24 hours) at 07/06/2020 1924 Last data filed at 07/06/2020 1845 Gross per 24 hour  Intake 720 ml  Output 400 ml  Net 320 ml     Pressure Injury 06/28/20 Thigh Left;Posterior;Proximal Stage 3 -  Full thickness tissue loss. Subcutaneous fat may be visible but bone, tendon or muscle are NOT exposed. (Active)  06/28/20 1600 (Assessed at office prior to admission)  Location: Thigh  Location Orientation: Left;Posterior;Proximal  Staging: Stage 3 -  Full thickness tissue loss.  Subcutaneous fat may be visible but bone, tendon or muscle are NOT exposed.  Wound Description (Comments):   Present on Admission: Yes     Pressure Injury 07/05/20 Coccyx Mid Stage 1 -  Intact skin with non-blanchable redness of a localized area usually over a bony prominence. (Active)  07/05/20 1540  Location: Coccyx  Location Orientation: Mid  Staging: Stage 1 -  Intact skin with non-blanchable redness of a localized area usually over a bony prominence.  Wound Description (Comments):   Present on Admission: Yes    Physical Exam: Vital Signs Blood pressure 97/71, pulse (!) 59, temperature 98 F (36.7 C), resp. rate 17, height 5\' 10"  (1.778 m), weight 74.8 kg, SpO2 97 %.    General: awake, alert, appropriate, laying supine on low air loss mattress; NT in room;  NAD HENT: conjugate gaze; oropharynx moist CV: borderline/low regular rate; no JVD Pulmonary: CTA B/L; no W/R/R- good air movement GI: soft, NT, ND, (+)BS; hypoactive Psychiatric: appropriate- cordial  Neurological: Ox3; some spasms seen;  Musculoskeletal:  General: No swelling.  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 Functional deficits which require 3+ hours per day of interdisciplinary therapy in a comprehensive inpatient rehab setting.  Physiatrist is  providing close team supervision and 24 hour management of active medical problems listed below.  Physiatrist and rehab team continue to assess barriers to discharge/monitor patient progress toward functional and medical goals  Care Tool:  Bathing    Body parts bathed by patient: Right arm,Chest,Abdomen,Face,Right upper leg,Left upper leg   Body parts bathed by helper: Front perineal area,Buttocks,Right upper leg,Left upper leg,Right lower leg,Left lower leg,Face,Left arm     Bathing assist Assist Level: Total Assistance - Patient < 25%     Upper Body Dressing/Undressing Upper body dressing   What is the patient wearing?: Pull over shirt    Upper body assist Assist Level: Total Assistance - Patient < 25%    Lower Body Dressing/Undressing Lower body dressing      What is the patient wearing?: Underwear/pull up     Lower body assist Assist for lower body dressing: Total Assistance - Patient < 25%     Toileting Toileting    Toileting assist Assist for toileting: Dependent - Patient 0%     Transfers Chair/bed transfer  Transfers assist     Chair/bed transfer assist level: Dependent - mechanical lift (maxi move)     Locomotion Ambulation   Ambulation assist   Ambulation activity did not occur: Safety/medical concerns          Walk 10 feet activity   Assist  Walk 10 feet activity did not occur: Safety/medical concerns        Walk 50 feet activity   Assist Walk 50 feet with 2 turns activity did not occur: Safety/medical concerns         Walk 150 feet activity   Assist Walk 150 feet activity did not occur: Safety/medical concerns         Walk 10 feet on uneven surface  activity   Assist Walk 10 feet on uneven surfaces activity did not occur: Safety/medical concerns         Wheelchair     Assist Will patient use wheelchair at discharge?: Yes Type of Wheelchair:  (TBD)    Wheelchair assist level: Dependent - Patient  0% Max wheelchair distance: 150'    Wheelchair 50 feet with 2 turns activity    Assist        Assist Level: Dependent - Patient 0%   Wheelchair 150 feet activity     Assist      Assist Level: Dependent - Patient 0%   Blood pressure 97/71, pulse (!) 59, temperature 98 F (36.7 C), resp. rate 17, height 5\' 10"  (1.778 m), weight 74.8 kg, SpO2 97 %.  Medical Problem List and Plan: 1.C4 Tetraplegia -ASIA Csecondary tocervical myelopathy/cervical synovial cyst. Status post C3-4 laminectomy with posterior arthrodesis cervical instrumentation 06/29/2020. Cervical brace as directed. -patient may Shower with neck brace/incision covered -ELOS/Goals: ~3 weeks- min A -will order PRAFOs and have pt wear nightly to prevent ankle contractures  5/25 first day of therapies today 2. Antithrombotics: -DVT/anticoagulation:Lovenox. Check vascular study 3. Pain Management:Baclofen 5 mg 3 times daily, oxycodone and Flexeril as needed 4. Mood:Provide emotional support -antipsychotic agents: N/A 5. Neuropsych: This patientiscapable of making decisions on hisown behalf. 6. Skin/Wound  Care:Routine skin checks 7. Fluids/Electrolytes/Nutrition:Routine in and outs with follow-up chemistries 8. Atrial fibrillation. Lopressor 25 mg twice daily. Cardiac rate controlled  5/250 borderline low this AM, but overall controlled- con't regimen 9. Hypertension. Cozaar 50 mg daily. Monitor with increased mobility 10. BPH/UTI.Was onFlomax 0.4 mg daily.UTI greater than 80,000 Staphylococcus/Enterococcus completing course of Bactrim- please see neurogenic bladder- 11. Surgical PCR screening positive. Bactroban as directed 12. Hyperthyroidism- had lost 70 lbs in 6 months- in middle of treatment 13. Fatty liver-will need to be careful with any medicine going through liver.  14. Neurogenic bladder- will start on I/o caths q6 hours prn and increase  Flomax to 0.8 mg with supper  5/25 no side effects with flomax- con't regimen 15. Neurogenic bowel- doesn't have good control- will start with suppository- and dig stim and see if can withdraw any of them over time.   5/25- pt wants to try without suppository- I said if goes with control prior that day, can hold off bowel program, but will leave as needed 16. Spasticity- will increase Baclofen to 10 mg TID and con't Flexeril.  5/25- will d/c flexeril per pt request- and in 1-2 days, increase baclofen- NO DANTROLENE     LOS: 1 days A FACE TO FACE EVALUATION WAS PERFORMED  Corryn Madewell 07/06/2020, 7:24 PM

## 2020-07-06 NOTE — Progress Notes (Signed)
Occupational Therapy Session Note  Patient Details  Name: Darren Harrison MRN: [id number] Date of Birth: 1956-01-26  Today's Date: 07/06/2020 OT Individual Time: 1300-1345 OT Individual Time Calculation (min): 45 min    Short Term Goals: Week 1:  OT Short Term Goal 1 (Week 1): Pt will perform self feeding with Min A with AE PRN OT Short Term Goal 2 (Week 1): Pt will improve trunk control/sitting balance to sit EOB/EOM for 5 mins with no more than Min A OT Short Term Goal 3 (Week 1): Pt will complete sit <> stands in May with Max of 1  Skilled Therapeutic Interventions/Progress Updates:    Pt resting in bed upon arrival. OT intervention with focus on BUE AROM, discharge planning, review of role of OT and purpose of rehab, review of LTGs, and safety awareness. Pt issued red tubing to place on utensils to facilitate improved ability to self feed. Pt issued foam blocks for grasp strengthening. Pt remained in bed with all needs within reach,  Therapy Documentation Precautions:  Precautions Precautions: Cervical,Fall Precaution Comments: reviewed precautions Required Braces or Orthoses: Cervical Brace Cervical Brace: Hard collar,Other (comment) (donned in sitting) Restrictions Weight Bearing Restrictions: No   Pain:  Pt denies pain this afternoon   Therapy/Group: Individual Therapy  [name] 07/06/2020, 3:06 PM

## 2020-07-06 NOTE — Plan of Care (Signed)
Problem: RH Balance Goal: LTG: Patient will maintain dynamic sitting balance (OT) Description: LTG:  Patient will maintain dynamic sitting balance with assistance during activities of daily living (OT) Flowsheets (Taken 07/06/2020 1227) LTG: Pt will maintain dynamic sitting balance during ADLs with: Supervision/Verbal cueing Goal: LTG Patient will maintain dynamic standing with ADLs (OT) Description: LTG:  Patient will maintain dynamic standing balance with assist during activities of daily living (OT)  Flowsheets (Taken 07/06/2020 1227) LTG: Pt will maintain dynamic standing balance during ADLs with: Minimal Assistance - Patient > 75%   Problem: Sit to Stand Goal: LTG:  Patient will perform sit to stand in prep for activites of daily living with assistance level (OT) Description: LTG:  Patient will perform sit to stand in prep for activites of daily living with assistance level (OT) Flowsheets (Taken 07/06/2020 1227) LTG: PT will perform sit to stand in prep for activites of daily living with assistance level: Minimal Assistance - Patient > 75%   Problem: RH Eating Goal: LTG Patient will perform eating w/assist, cues/equip (OT) Description: LTG: Patient will perform eating with assist, with/without cues using equipment (OT) Flowsheets (Taken 07/06/2020 1227) LTG: Pt will perform eating with assistance level of: Set up assist    Problem: RH Grooming Goal: LTG Patient will perform grooming w/assist,cues/equip (OT) Description: LTG: Patient will perform grooming with assist, with/without cues using equipment (OT) Flowsheets (Taken 07/06/2020 1227) LTG: Pt will perform grooming with assistance level of: Supervision/Verbal cueing   Problem: RH Bathing Goal: LTG Patient will bathe all body parts with assist levels (OT) Description: LTG: Patient will bathe all body parts with assist levels (OT) Flowsheets (Taken 07/06/2020 1227) LTG: Pt will perform bathing with assistance level/cueing: Minimal  Assistance - Patient > 75%   Problem: RH Dressing Goal: LTG Patient will perform upper body dressing (OT) Description: LTG Patient will perform upper body dressing with assist, with/without cues (OT). Flowsheets (Taken 07/06/2020 1227) LTG: Pt will perform upper body dressing with assistance level of: Minimal Assistance - Patient > 75% Goal: LTG Patient will perform lower body dressing w/assist (OT) Description: LTG: Patient will perform lower body dressing with assist, with/without cues in positioning using equipment (OT) Flowsheets (Taken 07/06/2020 1227) LTG: Pt will perform lower body dressing with assistance level of: Minimal Assistance - Patient > 75%   Problem: RH Toileting Goal: LTG Patient will perform toileting task (3/3 steps) with assistance level (OT) Description: LTG: Patient will perform toileting task (3/3 steps) with assistance level (OT)  Flowsheets (Taken 07/06/2020 1227) LTG: Pt will perform toileting task (3/3 steps) with assistance level: Moderate Assistance - Patient 50 - 74%   Problem: RH Functional Use of Upper Extremity Goal: LTG Patient will use RT/LT upper extremity as a (OT) Description: LTG: Patient will use right/left upper extremity as a stabilizer/gross assist/diminished/nondominant/dominant level with assist, with/without cues during functional activity (OT) Flowsheets (Taken 07/06/2020 1227) LTG: Use of upper extremity in functional activities: RUE as nondominant level LTG: Pt will use upper extremity in functional activity with assistance level of: Supervision/Verbal cueing   Problem: RH Toilet Transfers Goal: LTG Patient will perform toilet transfers w/assist (OT) Description: LTG: Patient will perform toilet transfers with assist, with/without cues using equipment (OT) Flowsheets (Taken 07/06/2020 1227) LTG: Pt will perform toilet transfers with assistance level of: Minimal Assistance - Patient > 75%   Problem: RH Tub/Shower Transfers Goal: LTG Patient  will perform tub/shower transfers w/assist (OT) Description: LTG: Patient will perform tub/shower transfers with assist, with/without cues using equipment (OT)  Flowsheets (Taken 07/06/2020 1227) LTG: Pt will perform tub/shower stall transfers with assistance level of: Minimal Assistance - Patient > 75%
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
## 2020-07-06 NOTE — Progress Notes (Signed)
Inpatient Rehabilitation  Patient information reviewed and entered into eRehab system by Osamu Olguin M. Bryleigh Ottaway, M.A., CCC/SLP, PPS Coordinator.  Information including medical coding, functional ability and quality indicators will be reviewed and updated through discharge.    
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
## 2020-07-07 ENCOUNTER — Ambulatory Visit: Payer: 59 | Admitting: "Endocrinology

## 2020-07-07 DIAGNOSIS — G959 Disease of spinal cord, unspecified: Secondary | ICD-10-CM | POA: Diagnosis not present

## 2020-07-07 DIAGNOSIS — R252 Cramp and spasm: Secondary | ICD-10-CM

## 2020-07-07 DIAGNOSIS — L89152 Pressure ulcer of sacral region, stage 2: Secondary | ICD-10-CM | POA: Diagnosis not present

## 2020-07-07 DIAGNOSIS — K592 Neurogenic bowel, not elsewhere classified: Secondary | ICD-10-CM

## 2020-07-07 DIAGNOSIS — N319 Neuromuscular dysfunction of bladder, unspecified: Secondary | ICD-10-CM

## 2020-07-07 MED ORDER — CITALOPRAM HYDROBROMIDE 10 MG PO TABS
10.0000 mg | ORAL_TABLET | Freq: Every day | ORAL | Status: DC
  Administered 2020-07-07 – 2020-07-12 (×6): 10 mg via ORAL
  Filled 2020-07-07 (×6): qty 1

## 2020-07-07 MED ORDER — DIAZEPAM 5 MG PO TABS
5.0000 mg | ORAL_TABLET | Freq: Every day | ORAL | Status: DC
  Administered 2020-07-07: 5 mg via ORAL
  Filled 2020-07-07: qty 1

## 2020-07-07 NOTE — Progress Notes (Addendum)
PROGRESS NOTE   Subjective/Complaints:  Pt reports spasms still a big issue-less frequent but still intense-  are worse in evening/night time-  Also knows when needs to have a BM or void, however has urgency, so when figures out needs to go, needs to go ASAP or has incontinence.   Pain doing pretty well unless has bad position.   Was eating breakfast pretty well until plate slid into lap.    ROS:  Pt denies SOB, abd pain, CP, N/V/C/D, and vision changes    Objective:   VAS [place] LOWER EXTREMITY VENOUS (DVT)  Result Date: 07/05/2020  Lower Venous DVT Study Patient Name:  Darren Harrison  Date of Exam:   07/05/2020 Medical Rec #: [id number]         Accession #:    [id number] Date of Birth: Feb 27, 1955          Patient Gender: M Patient Age:   64Y Exam Location:  [facility] Procedure:      VAS [place] LOWER EXTREMITY VENOUS (DVT) Referring Phys: [place] --------------------------------------------------------------------------------  Indications: Swelling.  Risk Factors: Immobility Surgery Recent back surgery. Comparison Study: No previous exams Performing Technologist: Rogelia Rohrer  Examination Guidelines: A complete evaluation includes B-mode imaging, spectral Doppler, color Doppler, and power Doppler as needed of all accessible portions of each vessel. Bilateral testing is considered an integral part of a complete examination. Limited examinations for reoccurring indications may be performed as noted. The reflux portion of the exam is performed with the patient in reverse Trendelenburg.  +---------+---------------+---------+-----------+----------+--------------+ RIGHT    CompressibilityPhasicitySpontaneityPropertiesThrombus Aging +---------+---------------+---------+-----------+----------+--------------+ CFV      Full           Yes      Yes                                  +---------+---------------+---------+-----------+----------+--------------+ SFJ      Full                                                        +---------+---------------+---------+-----------+----------+--------------+ FV Prox  Full           Yes      Yes                                 +---------+---------------+---------+-----------+----------+--------------+ FV Mid   Full           Yes      Yes                                 +---------+---------------+---------+-----------+----------+--------------+ FV DistalFull           Yes      Yes                                 +---------+---------------+---------+-----------+----------+--------------+  PFV      Full                                                        +---------+---------------+---------+-----------+----------+--------------+ POP      Full           Yes      Yes                                 +---------+---------------+---------+-----------+----------+--------------+ PTV      Full                                                        +---------+---------------+---------+-----------+----------+--------------+ PERO     Full                                                        +---------+---------------+---------+-----------+----------+--------------+   +---------+---------------+---------+-----------+----------+--------------+ LEFT     CompressibilityPhasicitySpontaneityPropertiesThrombus Aging +---------+---------------+---------+-----------+----------+--------------+ CFV      Full           Yes      Yes                                 +---------+---------------+---------+-----------+----------+--------------+ SFJ      Full                                                        +---------+---------------+---------+-----------+----------+--------------+ FV Prox  Full           Yes      Yes                                  +---------+---------------+---------+-----------+----------+--------------+ FV Mid   Full           Yes      Yes                                 +---------+---------------+---------+-----------+----------+--------------+ FV DistalFull           Yes      Yes                                 +---------+---------------+---------+-----------+----------+--------------+ PFV      Full                                                        +---------+---------------+---------+-----------+----------+--------------+  POP      Full           Yes      Yes                                 +---------+---------------+---------+-----------+----------+--------------+ PTV      Full                                                        +---------+---------------+---------+-----------+----------+--------------+ PERO     Full                                                        +---------+---------------+---------+-----------+----------+--------------+     Summary: BILATERAL: - No evidence of deep vein thrombosis seen in the lower extremities, bilaterally. - No evidence of superficial venous thrombosis in the lower extremities, bilaterally. -No evidence of popliteal cyst, bilaterally.   *See table(s) above for measurements and observations. Electronically signed by Deitra Mayo MD on 07/05/2020 at 5:35:03 PM.    Final    Recent Labs    07/06/20 0412  WBC 5.0  HGB 11.8*  HCT 35.5*  PLT 166   Recent Labs    07/06/20 0412  NA 134*  K 4.0  CL 102  CO2 25  GLUCOSE 100*  BUN 16  CREATININE 0.59*  CALCIUM 8.8*    Intake/Output Summary (Last 24 hours) at 07/07/2020 1006 Last data filed at 07/07/2020 0750 Gross per 24 hour  Intake 720 ml  Output 325 ml  Net 395 ml     Pressure Injury 06/28/20 Thigh Left;Posterior;Proximal Stage 3 -  Full thickness tissue loss. Subcutaneous fat may be visible but bone, tendon or muscle are NOT exposed. (Active)  06/28/20 1600 (Assessed  at office prior to admission)  Location: Thigh  Location Orientation: Left;Posterior;Proximal  Staging: Stage 3 -  Full thickness tissue loss. Subcutaneous fat may be visible but bone, tendon or muscle are NOT exposed.  Wound Description (Comments):   Present on Admission: Yes     Pressure Injury 07/05/20 Coccyx Mid Stage 1 -  Intact skin with non-blanchable redness of a localized area usually over a bony prominence. (Active)  07/05/20 1540  Location: Coccyx  Location Orientation: Mid  Staging: Stage 1 -  Intact skin with non-blanchable redness of a localized area usually over a bony prominence.  Wound Description (Comments):   Present on Admission: Yes    Physical Exam: Vital Signs Blood pressure 99/70, pulse 81, temperature 97.7 F (36.5 C), resp. rate 18, height 5\' 10"  (1.778 m), weight 74.8 kg, SpO2 95 %.     General: awake, alert, appropriate, sitting up in bed; has plate in lap and OJ about to fall; NAD HENT: conjugate gaze; oropharynx moist CV: regular rate; no JVD Pulmonary: CTA B/L; no W/R/R- good air movement GI: soft, NT, ND, (+)BS Psychiatric: appropriate; appears depressed, upset when discussed diagnosis Neurological: alert; still seeing spasms with ROM AROM and pROM Musculoskeletal:  General: No swelling.  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 Functional deficits which require 3+ hours per day of interdisciplinary therapy in a comprehensive inpatient rehab setting.  Physiatrist is providing close team supervision and 24 hour management of active medical problems listed below.  Physiatrist and rehab team continue to assess barriers to discharge/monitor patient progress toward functional and medical goals  Care Tool:  Bathing    Body parts bathed by patient: Chest,Abdomen,Face   Body parts bathed by helper: Right arm,Left arm,Front perineal area,Buttocks,Right upper leg,Left upper leg,Right lower leg,Left lower leg     Bathing assist Assist Level: Total Assistance - Patient < 25%     Upper Body Dressing/Undressing Upper body dressing   What is the patient wearing?: Pull over shirt    Upper body assist Assist Level: Total Assistance - Patient < 25%    Lower Body Dressing/Undressing Lower body dressing      What is the patient wearing?: Incontinence brief,Pants     Lower body assist Assist for lower body dressing: 2 Helpers     Toileting Toileting    Toileting assist Assist for toileting: Dependent - Patient 0%     Transfers Chair/bed transfer  Transfers assist     Chair/bed transfer assist level: Dependent - mechanical lift (maxi move)     Locomotion Ambulation   Ambulation assist   Ambulation activity did not occur: Safety/medical concerns          Walk 10 feet activity   Assist  Walk 10 feet activity did not occur: Safety/medical concerns        Walk 50 feet activity   Assist Walk 50 feet with 2 turns activity did not occur: Safety/medical concerns         Walk 150 feet activity   Assist Walk 150 feet activity did not occur: Safety/medical concerns         Walk 10 feet on uneven surface  activity   Assist Walk 10 feet on uneven surfaces activity did not occur: Safety/medical concerns          Wheelchair     Assist Will patient use wheelchair at discharge?: Yes Type of Wheelchair:  (TBD)    Wheelchair assist level: Dependent - Patient 0% Max wheelchair distance: 150'    Wheelchair 50 feet with 2 turns activity    Assist        Assist Level: Dependent - Patient 0%   Wheelchair 150 feet activity     Assist      Assist Level: Dependent - Patient 0%   Blood pressure 99/70, pulse 81, temperature 97.7 F (36.5 C), resp. rate 18, height 5\' 10"  (1.778 m), weight 74.8 kg, SpO2 95 %.  Medical Problem List and Plan: 1.C4 Tetraplegia -ASIA Csecondary tocervical myelopathy/cervical synovial cyst. Status post C3-4 laminectomy with posterior arthrodesis cervical instrumentation 06/29/2020. Cervical brace as directed. -patient may Shower with neck  brace/incision covered -ELOS/Goals: ~3 weeks- min A -will order PRAFOs and have pt wear nightly to prevent ankle contractures  5/26- con't PT and OT- spent 20 minutes going over ASIA level, and info about SCI including neurogenic bowel and bladder, spasticity and increased risk of DVT/pressure ulcers.  2. Antithrombotics: -DVT/anticoagulation:Lovenox. Check vascular study 5/26- will need for at least 2 months- maybe 3 months total from surgery.  3. Pain Management:Baclofen 5 mg 3 times daily, oxycodone and Flexeril as needed  5/26- pain pretty well controlled- con't regimen 4. Mood:Provide emotional support  5/26- pt has been tearful understandably- will try Celexa 10 mg daily -antipsychotic agents: N/A 5. Neuropsych: This patientiscapable of making decisions on hisown behalf. 6. Skin/Wound Care:Routine skin checks 7. Fluids/Electrolytes/Nutrition:Routine in and outs with follow-up chemistries 8. Atrial fibrillation. Lopressor 25 mg twice daily. Cardiac rate controlled  5/25- borderline low this AM, but overall controlled- con't regimen 9. Hypertension. Cozaar  50 mg daily. Monitor with increased mobility  5/26- BP soft- but haven't heard about orthostatic hypotension- will d/w PT/OT 10. BPH/UTI.Was onFlomax 0.4 mg daily.UTI greater than 80,000 Staphylococcus/Enterococcus completing course of Bactrim- please see neurogenic bladder- 11. Surgical PCR screening positive. Bactroban as directed 12. Hyperthyroidism- had lost 70 lbs in 6 months- in middle of treatment 13. Fatty liver-will need to be careful with any medicine going through liver.  14. Neurogenic bladder- will start on I/o caths q6 hours prn and increase Flomax to 0.8 mg with supper  5/25 no side effects with flomax- con't regimen  5/26- went over with pt, will need in/out caths or Foley until he's able to emtpy- I cannot guarantee he will get complete function back.  15. Neurogenic bowel- doesn't have good control- will start with suppository- and dig stim and see if can withdraw any of them over time.   5/25- pt wants to try without suppository- I said if goes with control prior that day, can hold off bowel program, but will leave as needed 16. Spasticity- will increase Baclofen to 10 mg TID and con't Flexeril.  5/25- will d/c flexeril per pt request- and in 1-2 days, increase baclofen- NO DANTROLENE  5/26- will add Valium 5 mg QHS and monitor- will likely increase Baclofen to 15 mg TID tomorrow 17. Orthostatic hypotension  5/26- will add TEDs and Abd binder during day.    I spent a total of 35 minutes on total care today- >50% coordination of care- going over ASIA exam, and explaining what that means, and going over prognosis on SCI as well as neurogenic bowel and bladder.   LOS: 2 days A FACE TO FACE EVALUATION WAS PERFORMED  Leahmarie Gasiorowski 07/07/2020, 10:06 AM

## 2020-07-07 NOTE — Care Management (Signed)
Inpatient Rehabilitation Center Individual Statement of Services  Patient Name:  Darren Harrison  Date:  07/07/2020  Welcome to the [facility].  Our goal is to provide you with an individualized program based on your diagnosis and situation, designed to meet your specific needs.  With this comprehensive rehabilitation program, you will be expected to participate in at least 3 hours of rehabilitation therapies Monday-Friday, with modified therapy programming on the weekends.  Your rehabilitation program will include the following services:  Physical Therapy (PT), Occupational Therapy (OT), 24 hour per day rehabilitation nursing, Therapeutic Recreaction (TR), Psychology, Neuropsychology, Care Coordinator, Rehabilitation Medicine, Nutrition Services, Pharmacy Services and Other  Weekly team conferences will be held on Tuesdays to discuss your progress.  Your Inpatient Rehabilitation Care Coordinator will talk with you frequently to get your input and to update you on team discussions.  Team conferences with you and your family in attendance may also be held.  Expected length of stay: 26-30 days   Overall anticipated outcome: Minimal Assistance  Depending on your progress and recovery, your program may change. Your Inpatient Rehabilitation Care Coordinator will coordinate services and will keep you informed of any changes. Your Inpatient Rehabilitation Care Coordinator's name and contact numbers are listed  below.  The following services may also be recommended but are not provided by the [facility] will be made to provide these services after discharge if needed.  Arrangements include referral to agencies that provide these services.  Your insurance has been verified to be:  [place]  Your primary doctor is:   Education administrator  Pertinent information will be shared with your doctor and your insurance company.  Inpatient Rehabilitation Care Coordinator:  Cathleen Corti [phone] or ([phone]  Information discussed with and copy given to patient by: Rana Snare, 07/07/2020, 10:10 AM

## 2020-07-07 NOTE — Progress Notes (Signed)
Occupational Therapy Session Note  Patient Details  Name: GLEEN RIPBERGER MRN: [id number] Date of Birth: [birth year]  Today's Date: 07/07/2020 OT Individual Time: 9292-4462 OT Individual Time Calculation (min): 56 min    Short Term Goals: Week 1:  OT Short Term Goal 1 (Week 1): Pt will perform self feeding with Min A with AE PRN OT Short Term Goal 2 (Week 1): Pt will improve trunk control/sitting balance to sit EOB/EOM for 5 mins with no more than Min A OT Short Term Goal 3 (Week 1): Pt will complete sit <> stands in [place] with Max of 1  Skilled Therapeutic Interventions/Progress Updates:    Pt resting in bed upon arrival. OT intervention with focus on bed mobility and bathing/dressing at bed level. Rolling R/L with min A using bed rails. Pt requires max A for bathing and UB dressing tasks. Pt dependent for LB dressing tasks. Pt with limited BUE AROM. Discussed placing pt on nursing night bath to allow time to focus on core strengthening and sitting balance during OT session. Pt agreeable and RN notified. Pt remained in bed with RN present.   Therapy Documentation Precautions:  Precautions Precautions: Cervical,Fall Precaution Comments: reviewed precautions Required Braces or Orthoses: Cervical Brace Cervical Brace: Hard collar,Other (comment) (donned in sitting) Restrictions Weight Bearing Restrictions: No   Pain:  Pt reports "it is ok" but also reported BLE and back spasms during session; repositioned   Therapy/Group: Individual Therapy  [name] 07/07/2020, 9:59 AM

## 2020-07-07 NOTE — Progress Notes (Signed)
Physical Therapy Session Note  Patient Details  Name: Darren Harrison MRN: [id number] Date of Birth: 27-Sep-1955  Today's Date: 07/07/2020 PT Individual Time: 1000-1100 PT Individual Time Calculation (min): 60 min   Short Term Goals: Week 1:  PT Short Term Goal 1 (Week 1): Pt will complete bed mobility with assist x 1 consistently PT Short Term Goal 2 (Week 1): Pt will initiate slide board transfers PT Short Term Goal 3 (Week 1): Pt will maintain sitting balance x 5 min with min A  Skilled Therapeutic Interventions/Progress Updates:    Pt received seated in bed, agreeable to PT session. Pt reports improvement in pain this date from previous date, however pt has onset of pain at incision site with sitting balance and transfers this date, requests pain medication from nursing at end of session. Education with patient regarding asking for pain medication prior to therapy sessions because even though he has no pain at rest pain can be limiting for him with mobility. Rolling L/R with mod A for placement of maxi move sling. Maxi move transfer bed to TIS. Dependent transport via [place] chair to/from therapy gym. Introduced Warehouse manager transfer this date. Slide board transfer TIS chair to/from mat table with total A x 2. Pt with poor trunk control and fair ability to maintain head/hips relationship during transfer. Pt requires up to total A for sitting balance on mat table. Session focus on core activation while in sitting position. Anterior and lateral leans with assist needed to maintain balance and return to midline. Pt has increase in incision pain with all mobility and is most comfortable in semi-reclined position against therapy ball. Seated alt UE reaching across midline and outside BOS. Pt exhibits decreased ability to reach with RUE as compared to LUE. Pt agreeable to remain seated in TIS chair at end of session. Education with patient regarding boosting/pressure relief schedule and importance of position  changes to prevent pressure sores, will continue to educate patient. Pt left semi-reclined in TIS chair in room with needs in reach at end of session.  Therapy Documentation Precautions:  Precautions Precautions: Cervical,Fall Precaution Comments: reviewed precautions Required Braces or Orthoses: Cervical Brace Cervical Brace: Hard collar,Other (comment) (donned in sitting) Restrictions Weight Bearing Restrictions: No    Therapy/Group: Individual Therapy   [name], PT, DPT, CSRS  07/07/2020, 12:11 PM

## 2020-07-07 NOTE — Progress Notes (Signed)
Physical Therapy Session Note  Patient Details  Name: Darren Harrison MRN: [id number] Date of Birth: 01/11/1956  Today's Date: 07/07/2020 PT Individual Time: 3557-3220 and 1440 to 1540 PT Individual Time Calculation (min): 38 min and 60 min  Short Term Goals: Week 1:  PT Short Term Goal 1 (Week 1): Pt will complete bed mobility with assist x 1 consistently PT Short Term Goal 2 (Week 1): Pt will initiate slide board transfers PT Short Term Goal 3 (Week 1): Pt will maintain sitting balance x 5 min with min A Week 2:    Week 3:     Skilled Therapeutic Interventions/Progress Updates:  Am session:    Pain:  Pt reports cervical  Pain w/wt shifting activities.  Treatment to tolerance.  Rest breaks and repositioning as needed.  Cervical collar readjusted/tightened for improved support.  Pt initially oob in wc and agreeable to treatment session w/focus on LE strengthening, core strenghtening, wc positioning.  wc propulsion for bipedal acivation w/max assist, pt able to perform "stepping 1ft x 1, 71ft x 1 w/decreased RLE step length vs R.  Seated A/P wt shifting w/max assist, uses UEs to assist, pain in cspine w/ant positioning.  Worked on attempted placing by therapist at balance point - pt max assist to maintain, very limited acitvation of abdominals.  Seated at hilo table, worked on rolling bolster up blue wedge w/max assist for RUE/ support provided at elbow/wrist w/slight ant trunk shift at end range.  wc - noted pressure just proximal to fibular head from legrests, added foam block to each side to offload.  May consider positioining aid to decrease abd/ER at hips.  Will also adjust leg length of legrests next session.  Pt left oob in wc w/alarm belt set and needs in reach  PM SESSION PAIN pt c/o intermittent pain in neck relieved w/rest breaks in tilted position, significant painful spasms occur during session this pm, care taken to move limbs slowly when repositioning pt.   Pt  initially oob in wc, reports having been up entire day.  Discussed liklihood of benefit of resting in bed midday to decrease pressure on cspine/upper back/neck musculature.   Educated pt on calling for assist as needed.  Wife present and per pt request, wife was educated on technique for locking/unlocking brakes and tilting wc.  Also on use of gait belt to adduct hips to neutral and prevent pressure to region of fibular head.  Pt provided w/storage back for equipment provided including dysom and theraband for positioning w/Kinetron use for LE strengthening.  Pt transported to gym for continued session.  Legrests adjusted for optimal seating.   Seated at hi/lo table pt performed the following UE therex: Elbow flexion/extension w/some hioriontal abd/add w/towel to devrease friction 1.5 min each UE Grasping and removing pegs from peg board and placing over 1 inch lip into bin on L w/LUE, returning pegs to peg board to address UE strength/grasp strength.  Kinetron Pt required use of dysom and theraband to secure feet then performs Kinetron x 1 min at 90*/sec, moves slowly, onset of spasm led to discontinuation of task, discussion on need for rest breaks.  At end of session, pt transported to room.  Pt left oob in wc w/alarm belt set and needs in reach Session timed out, nursing notified pt in need of assist to bed via lift. Therapy Documentation Precautions:  Precautions Precautions: Cervical,Fall Precaution Comments: reviewed precautions Required Braces or Orthoses: Cervical Brace Cervical Brace: Hard collar,Other (comment) (donned in sitting)  Restrictions Weight Bearing Restrictions: No   Therapy/Group: Individual Therapy  Callie Fielding, [place] 07/07/2020, 12:13 PM

## 2020-07-08 DIAGNOSIS — G959 Disease of spinal cord, unspecified: Secondary | ICD-10-CM | POA: Diagnosis not present

## 2020-07-08 MED ORDER — BACLOFEN 5 MG HALF TABLET
15.0000 mg | ORAL_TABLET | Freq: Three times a day (TID) | ORAL | Status: DC
  Administered 2020-07-08 – 2020-08-10 (×98): 15 mg via ORAL
  Filled 2020-07-08 (×99): qty 1

## 2020-07-08 MED ORDER — DIAZEPAM 5 MG PO TABS
5.0000 mg | ORAL_TABLET | Freq: Two times a day (BID) | ORAL | Status: DC
  Administered 2020-07-08 – 2020-07-15 (×15): 5 mg via ORAL
  Filled 2020-07-08 (×16): qty 1

## 2020-07-08 NOTE — Progress Notes (Signed)
Occupational Therapy Session Note  Patient Details  Name: Darren Harrison MRN: [id number] Date of Birth: [birth year]  Today's Date: 07/08/2020 OT Individual Time: 9518-8416 OT Individual Time Calculation (min): 40 min    Short Term Goals: Week 1:  OT Short Term Goal 1 (Week 1): Pt will perform self feeding with Min A with AE PRN OT Short Term Goal 2 (Week 1): Pt will improve trunk control/sitting balance to sit EOB/EOM for 5 mins with no more than Min A OT Short Term Goal 3 (Week 1): Pt will complete sit <> stands in [place] with Max of 1  Skilled Therapeutic Interventions/Progress Updates:    Pt resting in bed upon arrival. Pt stated he was feeling much better. Pt also commented that he did vomit and staff had helped clean him and change shirts. Per order Ted hose acquired and donned. Shorts threaded on BLE but left at knees to facilitate bladder scan. OT intervention with focus on bed mobility. Rolling R<>L with min A using bed rails. Pt remained in bed with all needs within reach. NT notified pt avail for scan.  Therapy Documentation Precautions:  Precautions Precautions: Cervical,Fall Precaution Comments: reviewed precautions Required Braces or Orthoses: Cervical Brace Cervical Brace: Hard collar,Other (comment) (donned in sitting) Restrictions Weight Bearing Restrictions: No    Pain: Pain Assessment Pain Scale: 0-10 Pain Score: 0-No pain Pt c/o ongoing spasms; RN and MD aware   Therapy/Group: Individual Therapy  [name] 07/08/2020, 12:09 PM

## 2020-07-08 NOTE — IPOC Note (Signed)
Overall Plan of Care (IPOC) Patient Details Name: Darren Harrison MRN: [id number] DOB: Mar 19, 1955  Admitting Diagnosis: Cervical myelopathy [facility])  Hospital Problems: Principal Problem:   Cervical myelopathy ([place]) Active Problems:   Pressure injury of skin   Neurogenic bowel   Neurogenic bladder   Spasticity     Functional Problem List: Nursing Bladder,Bowel,Edema,Endurance,Pain,Sensory,Medication Management,Skin Integrity,Safety  PT Balance,Endurance,Motor,Pain,Safety,Sensory,Skin Integrity  OT Balance,Endurance,Motor,Pain,Safety,Sensory,Perception  SLP    TR         Basic ADL's: OT Eating,Grooming,Bathing,Toileting,Dressing     Advanced  ADL's: OT       Transfers: PT Bed Mobility,Bed to [place]  OT Toilet,Tub/Shower     Locomotion: PT Ambulation,Wheelchair Mobility,Stairs     Additional Impairments: OT None,Fuctional Use of Upper Extremity  SLP        TR      Anticipated Outcomes Item Anticipated Outcome  Self Feeding Set up  Swallowing      Basic self-care  Min overall  Toileting  Mod A   Bathroom Transfers Min A  Bowel/Bladder  Manage bowel with min assist and bladder with min assist  Transfers  min A  Locomotion  min A at w/c level  Communication     Cognition     Pain  pain at or below level 4  Safety/Judgment  maintain safety with cues/reminders   Therapy Plan: PT Intensity: Minimum of 1-2 x/day ,45 to 90 minutes PT Frequency: 5 out of 7 days PT Duration Estimated Length of Stay: 28-30 days OT Intensity: Minimum of 1-2 x/day, 45 to 90 minutes OT Frequency: 5 out of 7 days OT Duration/Estimated Length of Stay: 26-28 days     Due to the current state of emergency, patients may not be receiving their 3-hours of Medicare-mandated therapy.   Team Interventions: Nursing Interventions Patient/Family Education,Bowel Management,Pain Management,Skin Care/Wound Management,Discharge Planning,Medication Management,Disease  Management/Prevention,Bladder [employer] Support  PT interventions Balance/vestibular training,Community reintegration,Discharge planning,Disease IT sales professional stimulation,Functional mobility training,Neuromuscular re-education,Pain management,Patient/family education,Psychosocial support,Splinting/orthotics,Therapeutic Activities,Therapeutic Exercise,UE/LE Strength taining/ROM,UE/LE Museum/gallery conservator propulsion/positioning  OT Interventions Balance/vestibular training,Discharge planning,Functional electrical stimulation,Pain management,Self Care/advanced ADL retraining,Therapeutic Activities,UE/LE Coordination activities,Visual/perceptual remediation/compensation,Therapeutic Exercise,Skin care/wound managment,Patient/family education,Functional mobility training,Disease mangement/prevention,Community reintegration,DME/adaptive equipment instruction,Neuromuscular re-education,Psychosocial support,Splinting/orthotics,UE/LE Strength taining/ROM,Wheelchair propulsion/positioning  SLP Interventions    TR Interventions    SW/CM Interventions Discharge Planning,Psychosocial Support,Patient/Family Education   Barriers to Discharge MD  Medical stability  Nursing Decreased caregiver support,Home environment access/layout,Wound Care,Neurogenic Bowel & Bladder 2 level B?B on main, wife works; wheelchair bound since 4/22 due to myelopathy  PT Decreased caregiver support,Neurogenic Bowel & Bladder,Lack of/limited family support    OT Decreased caregiver support    SLP      SW Decreased caregiver support,Lack of/limited family support,Insurance for SNF coverage Pt will need to be Mod I or require intermittent support at discharge due to limited natural supports.   Team Discharge Planning: Destination: PT-Home ,OT- Home , SLP-  Projected Follow-up: PT-Home health PT,24 hour supervision/assistance, OT-  Home health  OT, SLP-  Projected Equipment Needs: PT-Wheelchair (measurements),Wheelchair cushion (measurements), OT- To be determined, SLP-  Equipment Details: PT-TBD pending progress, OT-  Patient/family involved in discharge planning: PT- Patient,  OT-Patient, SLP-   MD ELOS: 28-30 days Medical Rehab Prognosis:  Excellent Assessment: The patient has been admitted for CIR therapies with the diagnosis of cervical myelopathy. The team will be addressing functional mobility, strength, stamina, balance, safety, adaptive techniques and equipment, self-care, bowel and bladder mgt, patient and caregiver education, NMR, ego support, pain control, community reentry, w/c assessment. Goals  have been set at min to mod assist with self-care and min assist with w/c mobility and transfers..   Due to the current state of emergency, patients may not be receiving their 3 hours per day of Medicare-mandated therapy.    Meredith Staggers, MD, FAAPMR      See Team Conference Notes for weekly updates to the plan of care

## 2020-07-08 NOTE — Progress Notes (Signed)
Occupational Therapy Note  Patient Details  Name: MAHMUD KEITHLY MRN: [id number] Date of Birth: 1955/12/05  Today's Date: 07/08/2020 OT Missed Time: 16 Minutes Missed Time Reason: Patient fatigue;Other (comment) (pt c/o n/v and not feeling well (rough night))  Pt resting in bed upon arrival. Pt c/o n/v and having a "rough night." Pt also c/o "bed problems" during the night. Camera operator Deidre Ala) notified. Pt missed 60 mins skilled OT services. Will check back later.   Leotis Shames [facility] 07/08/2020, 8:53 AM

## 2020-07-08 NOTE — Progress Notes (Signed)
Physical Therapy Note  Patient Details  Name: TADEN WITTER MRN: [id number] Date of Birth: May 09, 1955 Today's Date: 07/08/2020  Missed PT time:  60 min. 2/2 nausea.  Pt nauseated when entering the room, diaphoretic.  Pt states vomited this AM and continues to feel that way.  Pt given cold washcloth for face.  Pt states pain 2/2 bed "which hasn't been fixed."  Pt c/o pain w/ neck.  Offered towel roll and pillow re-positioning and states improvement.  Pt remained in bed w/ all needs in reach.     Ladoris Gene 07/08/2020, 1:20 PM
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
## 2020-07-08 NOTE — Progress Notes (Signed)
PROGRESS NOTE   Subjective/Complaints:   Pt swears is having good but small BMs- thinks is emptying bladder, with occ in/out caths.  Had dig stim today with great results- I think that's the best choice for now.   R hip pain and RLE worse with spasms- Valium helped a little last night, but spasms still very intense.  Has R shoulder pain from previous injury as well- bothering him.    ROS:  Pt denies SOB, abd pain, CP, N/V/C/D, and vision changes   Objective:   No results found. Recent Labs    07/06/20 0412  WBC 5.0  HGB 11.8*  HCT 35.5*  PLT 166   Recent Labs    07/06/20 0412  NA 134*  K 4.0  CL 102  CO2 25  GLUCOSE 100*  BUN 16  CREATININE 0.59*  CALCIUM 8.8*    Intake/Output Summary (Last 24 hours) at 07/08/2020 1929 Last data filed at 07/08/2020 1831 Gross per 24 hour  Intake 580 ml  Output 750 ml  Net -170 ml     Pressure Injury 06/28/20 Thigh Left;Posterior;Proximal Stage 3 -  Full thickness tissue loss. Subcutaneous fat may be visible but bone, tendon or muscle are NOT exposed. (Active)  06/28/20 1600 (Assessed at office prior to admission)  Location: Thigh  Location Orientation: Left;Posterior;Proximal  Staging: Stage 3 -  Full thickness tissue loss. Subcutaneous fat may be visible but bone, tendon or muscle are NOT exposed.  Wound Description (Comments):   Present on Admission: Yes     Pressure Injury 07/05/20 Coccyx Mid Stage 1 -  Intact skin with non-blanchable redness of a localized area usually over a bony prominence. (Active)  07/05/20 1540  Location: Coccyx  Location Orientation: Mid  Staging: Stage 1 -  Intact skin with non-blanchable redness of a localized area usually over a bony prominence.  Wound Description (Comments):   Present on Admission: Yes    Physical Exam: Vital Signs Blood pressure (!) 118/98, pulse 92, temperature 97.9 F (36.6 C), resp. rate 15, height 5\' 10"   (1.778 m), weight 74.8 kg, SpO2 97 %.     General: awake, alert, appropriate, asleep- woke to verbal stimuli;  NAD HENT: conjugate gaze; oropharynx moist CV: regular rate; no JVD Pulmonary: CTA B/L; no W/R/R- good air movement GI: soft, NT, ND, (+)BS Psychiatric: appropriate but depressed affect Neurological: Ox3; has a few spasms seen again this AM with AAROM  Musculoskeletal:  General: No swelling.  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 Functional deficits which require 3+ hours per day of interdisciplinary therapy in a comprehensive inpatient rehab setting.  Physiatrist is providing close team supervision and 24 hour management of active medical problems listed below.  Physiatrist and rehab team continue to assess barriers to discharge/monitor patient progress toward functional and medical goals  Care Tool:  Bathing    Body parts bathed by patient: Chest,Abdomen,Face   Body parts bathed by helper: Right arm,Left arm,Front perineal area,Buttocks,Right upper leg,Left upper leg,Right lower leg,Left lower leg     Bathing assist Assist Level: Total Assistance - Patient < 25%     Upper Body Dressing/Undressing Upper body dressing   What is the patient wearing?: Pull over shirt    Upper body assist  Assist Level: Total Assistance - Patient < 25%    Lower Body Dressing/Undressing Lower body dressing      What is the patient wearing?: Pants     Lower body assist Assist for lower body dressing: 2 Helpers     Toileting Toileting    Toileting assist Assist for toileting: Dependent - Patient 0%     Transfers Chair/bed transfer  Transfers assist     Chair/bed transfer assist level: 2 Helpers (slide board)     Locomotion Ambulation   Ambulation assist   Ambulation activity did not occur: Safety/medical concerns          Walk 10 feet activity   Assist  Walk 10 feet activity did not occur: Safety/medical concerns        Walk 50 feet activity   Assist Walk 50 feet with 2 turns activity did not occur: Safety/medical concerns         Walk 150 feet activity   Assist Walk 150 feet activity did not occur: Safety/medical concerns         Walk 10 feet on uneven surface  activity   Assist Walk 10 feet on uneven surfaces activity did not occur: Safety/medical concerns         Wheelchair     Assist Will patient use wheelchair at discharge?: Yes Type of Wheelchair:  (TBD)    Wheelchair assist level: Dependent - Patient 0% Max wheelchair distance: 150'    Wheelchair 50 feet with 2 turns activity    Assist        Assist Level: Dependent - Patient 0%   Wheelchair 150 feet activity     Assist      Assist Level: Dependent - Patient 0%   Blood pressure (!) 118/98, pulse 92, temperature 97.9 F (36.6 C), resp. rate 15, height 5\' 10"  (1.778 m), weight 74.8 kg, SpO2 97 %.  Medical Problem List and Plan: 1.C4 Tetraplegia -ASIA Csecondary tocervical myelopathy/cervical synovial cyst. Status post C3-4 laminectomy with posterior arthrodesis cervical instrumentation 06/29/2020. Cervical brace as directed. -patient may Shower with neck brace/incision covered -ELOS/Goals: ~3 weeks- min A -will order  PRAFOs and have pt wear nightly to prevent ankle contractures  5/26- con't PT and OT- spent 20 minutes going over ASIA level, and info about SCI including neurogenic bowel and bladder, spasticity and increased risk of DVT/pressure ulcers.   5/27- con't PT and OT 2. Antithrombotics: -DVT/anticoagulation:Lovenox. Check vascular study 5/26- will need for at least 2 months- maybe 3 months total from surgery.  3. Pain Management:Baclofen 5 mg 3 times daily, oxycodone and Flexeril as needed  5/26- pain pretty well controlled- con't regimen  5/27- having more pain in R  shoulder- will con't regimen prn 4. Mood:Provide emotional support  5/26- pt has been tearful understandably- will try Celexa 10 mg daily -antipsychotic agents: N/A 5. Neuropsych: This patientiscapable of making decisions on hisown behalf. 6. Skin/Wound Care:Routine skin checks 7. Fluids/Electrolytes/Nutrition:Routine in and outs with follow-up chemistries 8. Atrial fibrillation. Lopressor 25 mg twice daily. Cardiac rate controlled  5/25- borderline low this AM, but overall controlled- con't regimen 9. Hypertension. Cozaar 50 mg daily. Monitor with increased mobility  5/26- BP soft- but haven't heard about orthostatic hypotension- will d/w PT/OT 10. BPH/UTI.Was onFlomax 0.4 mg daily.UTI greater than 80,000 Staphylococcus/Enterococcus completing course of Bactrim- please see neurogenic bladder- 11. Surgical PCR screening positive. Bactroban as directed 12. Hyperthyroidism- had lost 70 lbs in 6 months- in middle of treatment 13. Fatty liver-will need to be careful with any medicine going through liver.  14. Neurogenic bladder- will start on I/o caths q6 hours prn and increase Flomax to 0.8 mg with supper  5/25 no side effects with flomax- con't regimen  5/26- went over with pt, will need in/out caths or Foley until he's able to emtpy- I cannot guarantee he will get complete function back.  15.  Neurogenic bowel- doesn't have good control- will start with suppository- and dig stim and see if can withdraw any of them over time.   5/25- pt wants to try without suppository- I said if goes with control prior that day, can hold off bowel program, but will leave as needed  5/27- required bowel program with dig stim this evening- con't regimen prn 16. Spasticity- will increase Baclofen to 10 mg TID and con't Flexeril.  5/25- will d/c flexeril per pt request- and in 1-2 days, increase baclofen- NO DANTROLENE  5/26- will add Valium 5 mg QHS and monitor- will likely increase Baclofen to 15 mg TID tomorrow  5/27- will increase valium to BID and increase baclofen to 15 mg TID- due to pt's severe spasms- explained could sedate him- but pt says spasms really bad.  17. Orthostatic hypotension  5/26- will add TEDs and Abd binder during day.     LOS: 3 days A FACE TO FACE EVALUATION WAS PERFORMED  Lovie Zarling 07/08/2020, 7:29 PM

## 2020-07-08 NOTE — Progress Notes (Signed)
Suppository given, digital stim performed x3. Large amount of stool was evacuated. Night shift RN will complete bowel program.

## 2020-07-08 NOTE — Progress Notes (Signed)
Patient has felt nauseated all day. Vomited twice after receiving Zofran. Has also been diaphoretic. Linna Hoff, PA was notified. No new orders at the moment. Will continue to monitor patient.

## 2020-07-09 ENCOUNTER — Inpatient Hospital Stay (HOSPITAL_COMMUNITY): Payer: Managed Care, Other (non HMO)

## 2020-07-09 DIAGNOSIS — K560 Paralytic ileus: Secondary | ICD-10-CM | POA: Diagnosis not present

## 2020-07-09 DIAGNOSIS — G959 Disease of spinal cord, unspecified: Secondary | ICD-10-CM | POA: Diagnosis not present

## 2020-07-09 DIAGNOSIS — N319 Neuromuscular dysfunction of bladder, unspecified: Secondary | ICD-10-CM | POA: Diagnosis not present

## 2020-07-09 DIAGNOSIS — R252 Cramp and spasm: Secondary | ICD-10-CM | POA: Diagnosis not present

## 2020-07-09 MED ORDER — METOCLOPRAMIDE HCL 5 MG/ML IJ SOLN
10.0000 mg | Freq: Four times a day (QID) | INTRAMUSCULAR | Status: DC
  Administered 2020-07-09 – 2020-07-12 (×12): 10 mg via INTRAVENOUS
  Filled 2020-07-09 (×12): qty 2

## 2020-07-09 NOTE — Progress Notes (Signed)
PROGRESS NOTE   Subjective/Complaints:   Has had nausea and vomiting since yesterday morning. Vomited a couple times over night. Can't keep anything down. Has had some small bm's.   ROS: Patient denies fever, rash, sore throat, blurred vision,   diarrhea, cough, shortness of breath or chest pain, joint or back pain, headache, or mood change.     Objective:   No results found. No results for input(s): WBC, HGB, HCT, PLT in the last 72 hours. No results for input(s): NA, K, CL, CO2, GLUCOSE, BUN, CREATININE, CALCIUM in the last 72 hours.  Intake/Output Summary (Last 24 hours) at 07/09/2020 0906 Last data filed at 07/09/2020 0746 Gross per 24 hour  Intake 340 ml  Output 550 ml  Net -210 ml     Pressure Injury 06/28/20 Thigh Left;Posterior;Proximal Stage 3 -  Full thickness tissue loss. Subcutaneous fat may be visible but bone, tendon or muscle are NOT exposed. (Active)  06/28/20 1600 (Assessed at office prior to admission)  Location: Thigh  Location Orientation: Left;Posterior;Proximal  Staging: Stage 3 -  Full thickness tissue loss. Subcutaneous fat may be visible but bone, tendon or muscle are NOT exposed.  Wound Description (Comments):   Present on Admission: Yes     Pressure Injury 07/05/20 Coccyx Mid Stage 1 -  Intact skin with non-blanchable redness of a localized area usually over a bony prominence. (Active)  07/05/20 1540  Location: Coccyx  Location Orientation: Mid  Staging: Stage 1 -  Intact skin with non-blanchable redness of a localized area usually over a bony prominence.  Wound Description (Comments):   Present on Admission: Yes    Physical Exam: Vital Signs Blood pressure (!) 121/101, pulse 86, temperature 98.4 F (36.9 C), resp. rate 20, height 5\' 10"  (1.778 m), weight 74.8 kg, SpO2 99 %.     Constitutional: No distress . Vital signs reviewed. HEENT: EOMI, oral membranes moist Neck:  supple Cardiovascular: RRR without murmur. No JVD    Respiratory/Chest: CTA Bilaterally without wheezes or rales. Normal effort    GI/Abdomen: BS scarce, distended Ext: no clubbing, cyanosis, or edema Psych: pleasant and cooperative Musculoskeletal:  General: No swelling.  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 Sensation: RUE C4 ok- decreased from C5 to S1 on R LUE- C5 OK- decreased from C6 to S1 on L   Assessment/Plan: 1. Functional deficits which require 3+ hours per day of interdisciplinary therapy in a comprehensive inpatient rehab setting.  Physiatrist is providing close team supervision and 24 hour management of active medical problems listed below.  Physiatrist and rehab team continue to assess barriers to discharge/monitor patient progress  toward functional and medical goals  Care Tool:  Bathing    Body parts bathed by patient: Chest,Abdomen,Face   Body parts bathed by helper: Right arm,Left arm,Front perineal area,Buttocks,Right upper leg,Left upper leg,Right lower leg,Left lower leg     Bathing assist Assist Level: Total Assistance - Patient < 25%     Upper Body Dressing/Undressing Upper body dressing   What is the patient wearing?: Pull over shirt    Upper body assist Assist Level: Total Assistance - Patient < 25%    Lower Body Dressing/Undressing Lower body dressing      What is the patient wearing?: Pants     Lower body  assist Assist for lower body dressing: 2 Helpers     Toileting Toileting    Toileting assist Assist for toileting: Dependent - Patient 0%     Transfers Chair/bed transfer  Transfers assist     Chair/bed transfer assist level: 2 Helpers (slide board)     Locomotion Ambulation   Ambulation assist   Ambulation activity did not occur: Safety/medical concerns          Walk 10 feet activity   Assist  Walk 10 feet activity did not occur: Safety/medical concerns        Walk 50 feet activity   Assist Walk 50 feet with 2 turns activity did not occur: Safety/medical concerns         Walk 150 feet activity   Assist Walk 150 feet activity did not occur: Safety/medical concerns         Walk 10 feet on uneven surface  activity   Assist Walk 10 feet on uneven surfaces activity did not occur: Safety/medical concerns         Wheelchair     Assist Will patient use wheelchair at discharge?: Yes Type of Wheelchair:  (TBD)    Wheelchair assist level: Dependent - Patient 0% Max wheelchair distance: 150'    Wheelchair 50 feet with 2 turns activity    Assist        Assist Level: Dependent - Patient 0%   Wheelchair 150 feet activity     Assist      Assist Level: Dependent - Patient 0%   Blood pressure (!) 121/101, pulse 86, temperature 98.4 F (36.9 C), resp. rate 20, height 5\' 10"  (1.778 m), weight 74.8 kg, SpO2 99 %.  Medical Problem List and Plan: 1.C4 Tetraplegia -ASIA Csecondary tocervical myelopathy/cervical synovial cyst. Status post C3-4 laminectomy with posterior arthrodesis cervical instrumentation 06/29/2020. Cervical brace as directed. -patient may Shower with neck brace/incision covered -ELOS/Goals: ~3 weeks- min A -will order PRAFOs and have pt wear nightly to prevent ankle contractures  5/26- con't PT and OT- spent 20 minutes going over ASIA level, and info about SCI including neurogenic  bowel and bladder, spasticity and increased risk of DVT/pressure ulcers.   -Continue CIR therapies including PT, OT   2. Antithrombotics: -DVT/anticoagulation:Lovenox. Check vascular study 5/26- will need for at least 2 months- maybe 3 months total from surgery.  3. Pain Management:Baclofen 5 mg 3 times daily, oxycodone and Flexeril as needed  5/26- pain pretty well controlled- con't regimen  5/28-   con't regimen prn 4. Mood:Provide emotional support  5/26- pt has been tearful understandably- will try Celexa 10 mg daily -antipsychotic agents: N/A 5. Neuropsych: This patientiscapable of making decisions on hisown behalf. 6. Skin/Wound Care:Routine skin checks 7. Fluids/Electrolytes/Nutrition:Routine in and outs with follow-up chemistries 8. Atrial fibrillation. Lopressor 25 mg twice daily. Cardiac rate controlled  5/28 overall controlled- con't regimen 9. Hypertension. Cozaar 50 mg daily. Monitor with increased mobility  5/28 elevated today. ?d/t GI 10. BPH/UTI.Was onFlomax 0.4 mg daily.UTI greater than 80,000 Staphylococcus/Enterococcus completing course of Bactrim- please see neurogenic bladder- 11. Surgical PCR screening positive. Bactroban as directed 12. Hyperthyroidism- had lost 70 lbs in 6 months- in middle of treatment 13. Fatty liver-will need to be careful with any medicine going through liver.  14. Neurogenic bladder- will start on I/o caths q6 hours prn and increase Flomax to 0.8 mg with supper  5/25 no side effects with flomax- con't regimen  5/26- went over with pt, will need in/out caths or Foley until he's able to emtpy- I cannot guarantee he will get complete function back.  15. Neurogenic bowel- doesn't have good control- will start with suppository- and dig stim and see if can withdraw any of them over time.   5/25- pt wants to try without suppository- I said if goes with control prior that day, can hold off bowel program, but will  leave as needed  5/27- required bowel program with dig stim this evening- con't regimen prn 16. Spasticity- will increase Baclofen to 10 mg TID and con't Flexeril.  5/25- will d/c flexeril per pt request- and in 1-2 days, increase baclofen- NO DANTROLENE  5/26- will add Valium 5 mg QHS and monitor- will likely increase Baclofen to 15 mg TID tomorrow  5/27- increased valium to BID and increase baclofen to 15 mg TID- due to pt's severe spasms- explained could sedate him- but pt says spasms really bad.  17. Orthostatic hypotension  5/26- added TEDs and Abd binder during day.  18. Nausea,vomiting  -KUB demonstrates ileus  -likely d/t neuro, meds, etc  -change to liquid diet  -SSE today  -begin IV reglan     LOS: 4 days A FACE TO FACE EVALUATION WAS PERFORMED  Meredith Staggers 07/09/2020, 9:06 AM

## 2020-07-09 NOTE — Progress Notes (Signed)
Bowel program: Dig stem to clear rectal vault: med firm stool; no results from suppository yet. Will inform next shift of dig stem and enema.

## 2020-07-09 NOTE — Progress Notes (Signed)
Soap suds enema given about 2015. Dig stim preformed. Patient evacuated very large soft stool. New foam dressing applied to sacrum.

## 2020-07-10 DIAGNOSIS — K560 Paralytic ileus: Secondary | ICD-10-CM | POA: Diagnosis not present

## 2020-07-10 DIAGNOSIS — N319 Neuromuscular dysfunction of bladder, unspecified: Secondary | ICD-10-CM | POA: Diagnosis not present

## 2020-07-10 DIAGNOSIS — R252 Cramp and spasm: Secondary | ICD-10-CM | POA: Diagnosis not present

## 2020-07-10 DIAGNOSIS — G959 Disease of spinal cord, unspecified: Secondary | ICD-10-CM | POA: Diagnosis not present

## 2020-07-10 NOTE — Progress Notes (Signed)
PROGRESS NOTE   Subjective/Complaints:  Pt feeling much better this morning. Large results with SSE yesterday. Nausea better. Appetite is coming back  ROS: Patient denies fever, rash, sore throat, blurred vision, diarrhea, cough, shortness of breath or chest pain, joint or back pain, headache, or mood change.     Objective:   DG Abd 1 View  Result Date: 07/09/2020 CLINICAL DATA:  Nausea/vomiting, cervical fusion EXAM: ABDOMEN - 1 VIEW COMPARISON:  None. FINDINGS: Mildly dilated loops of small bowel. Mildly dilated right colon. Stool in the rectum. This appearance favors adynamic/postoperative ileus. IMPRESSION: Suspected adynamic/postoperative ileus. Electronically Signed   By: Julian Hy M.D.   On: 07/09/2020 09:34   No results for input(s): WBC, HGB, HCT, PLT in the last 72 hours. No results for input(s): NA, K, CL, CO2, GLUCOSE, BUN, CREATININE, CALCIUM in the last 72 hours.  Intake/Output Summary (Last 24 hours) at 07/10/2020 0749 Last data filed at 07/10/2020 2637 Gross per 24 hour  Intake 0 ml  Output 1250 ml  Net -1250 ml     Pressure Injury 06/28/20 Thigh Left;Posterior;Proximal Stage 3 -  Full thickness tissue loss. Subcutaneous fat may be visible but bone, tendon or muscle are NOT exposed. (Active)  06/28/20 1600 (Assessed at office prior to admission)  Location: Thigh  Location Orientation: Left;Posterior;Proximal  Staging: Stage 3 -  Full thickness tissue loss. Subcutaneous fat may be visible but bone, tendon or muscle are NOT exposed.  Wound Description (Comments):   Present on Admission: Yes     Pressure Injury 07/05/20 Coccyx Mid Stage 1 -  Intact skin with non-blanchable redness of a localized area usually over a bony prominence. (Active)  07/05/20 1540  Location: Coccyx  Location Orientation: Mid  Staging: Stage 1 -  Intact skin with non-blanchable redness of a localized area usually over a bony  prominence.  Wound Description (Comments):   Present on Admission: Yes    Physical Exam: Vital Signs Blood pressure 108/80, pulse 86, temperature 97.9 F (36.6 C), resp. rate 20, height 5\' 10"  (1.778 m), weight 74.8 kg, SpO2 99 %.     Constitutional: No distress . Vital signs reviewed. HEENT: EOMI, oral membranes moist Neck: supple Cardiovascular: RRR without murmur. No JVD    Respiratory/Chest: CTA Bilaterally without wheezes or rales. Normal effort    GI/Abdomen: BS +, non-tender, minimal distention Ext: no clubbing, cyanosis, or edema Psych: pleasant and cooperative Musculoskeletal:  General: No swelling.  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 Sensation: RUE C4 ok- decreased from C5 to S1 on R LUE- C5 OK- decreased  from C6 to S1 on L   Assessment/Plan: 1. Functional deficits which require 3+ hours per day of interdisciplinary therapy in a comprehensive inpatient rehab setting.  Physiatrist is providing close team supervision and 24 hour management of active medical problems listed below.  Physiatrist and rehab team continue to assess barriers to discharge/monitor patient progress toward functional and medical goals  Care Tool:  Bathing    Body parts bathed by patient: Chest,Abdomen,Face   Body parts bathed by helper: Right arm,Left arm,Front perineal area,Buttocks,Right upper leg,Left upper  leg,Right lower leg,Left lower leg     Bathing assist Assist Level: Total Assistance - Patient < 25%     Upper Body Dressing/Undressing Upper body dressing   What is the patient wearing?: Pull over shirt    Upper body assist Assist Level: Total Assistance - Patient < 25%    Lower Body Dressing/Undressing Lower body dressing      What is the patient wearing?: Pants     Lower body assist Assist for lower body dressing: 2 Helpers     Toileting Toileting    Toileting assist Assist for toileting: Dependent - Patient 0%     Transfers Chair/bed transfer  Transfers assist     Chair/bed transfer assist level: 2 Helpers (slide board)     Locomotion Ambulation   Ambulation assist   Ambulation activity did not occur: Safety/medical concerns          Walk 10 feet activity   Assist  Walk 10 feet activity did not occur: Safety/medical concerns        Walk 50 feet activity   Assist Walk 50 feet with 2 turns activity did not occur: Safety/medical concerns         Walk 150 feet activity   Assist Walk 150 feet activity did not occur: Safety/medical concerns         Walk 10 feet on uneven surface  activity   Assist Walk 10 feet on uneven surfaces activity did not occur: Safety/medical concerns         Wheelchair     Assist Will patient use wheelchair at discharge?: Yes Type of Wheelchair:  (TBD)    Wheelchair assist level: Dependent - Patient 0% Max wheelchair distance: 150'    Wheelchair 50 feet with 2 turns activity    Assist        Assist Level: Dependent - Patient 0%   Wheelchair 150 feet activity     Assist      Assist Level: Dependent - Patient 0%   Blood pressure 108/80, pulse 86, temperature 97.9 F (36.6 C), resp. rate 20, height 5\' 10"  (1.778 m), weight 74.8 kg, SpO2 99 %.  Medical Problem List and Plan: 1.C4 Tetraplegia -ASIA Csecondary tocervical myelopathy/cervical synovial cyst. Status post  C3-4 laminectomy with posterior arthrodesis cervical instrumentation 06/29/2020. Cervical brace as directed. -patient may Shower with neck brace/incision covered -ELOS/Goals: ~3 weeks- min A -will order PRAFOs and have pt wear nightly to prevent ankle contractures  5/26- con't PT and OT- spent 20 minutes going over ASIA level, and info about SCI including neurogenic bowel and bladder, spasticity and increased risk of DVT/pressure ulcers.   -Continue CIR therapies including PT, OT    2. Antithrombotics: -DVT/anticoagulation:Lovenox. Check vascular study 5/26- will need for at least 2 months- maybe 3 months total from surgery.  3. Pain Management:Baclofen 5 mg 3 times daily, oxycodone and Flexeril as needed  5/26- pain pretty well controlled- con't regimen  5/28-   con't  regimen prn 4. Mood:Provide emotional support  5/26- pt has been tearful understandably- will try Celexa 10 mg daily -antipsychotic agents: N/A 5. Neuropsych: This patientiscapable of making decisions on hisown behalf. 6. Skin/Wound Care:Routine skin checks 7. Fluids/Electrolytes/Nutrition:Routine in and outs with follow-up chemistries 8. Atrial fibrillation. Lopressor 25 mg twice daily. Cardiac rate controlled  5/28 overall controlled- con't regimen 9. Hypertension. Cozaar 50 mg daily. Monitor with increased mobility  5/28 elevated today. ?d/t GI 10. BPH/UTI.Was onFlomax 0.4 mg daily.UTI greater than 80,000 Staphylococcus/Enterococcus completing course of Bactrim- please see neurogenic bladder- 11. Surgical PCR screening positive. Bactroban as directed 12. Hyperthyroidism- had lost 70 lbs in 6 months- in middle of treatment 13. Fatty liver-will need to be careful with any medicine going through liver.  14. Neurogenic bladder- will start on I/o caths q6 hours prn and increase Flomax to 0.8 mg with supper  5/25 no side effects with flomax- con't regimen  5/26-  went over with pt, will need in/out caths or Foley until he's able to emtpy- I cannot guarantee he will get complete function back.  15. Neurogenic bowel- doesn't have good control- will start with suppository- and dig stim and see if can withdraw any of them over time.   5/25- pt wants to try without suppository- I said if goes with control prior that day, can hold off bowel program, but will leave as needed  5/27- required bowel program with dig stim this evening- con't regimen prn 16. Spasticity- will increase Baclofen to 10 mg TID and con't Flexeril.  5/25- will d/c flexeril per pt request- and in 1-2 days, increase baclofen- NO DANTROLENE  5/26- will add Valium 5 mg QHS and monitor- will likely increase Baclofen to 15 mg TID tomorrow  5/27- increased valium to BID and increase baclofen to 15 mg TID- due to pt's severe spasms- explained could sedate him- but pt says spasms really bad.   5/29 sx appear improved ---continue current regimen 17. Orthostatic hypotension  5/26- added TEDs and Abd binder during day.  18. Ileus  5/29 -pt much improved today   -likely d/t neuro, meds, etc   -continue clear liquid diet   -continue IV reglan    -primary team can adjust bowel program tomorrow   -recheck KUB in AM for resolution    LOS: 5 days A FACE TO [place] 07/10/2020, 7:49 AM

## 2020-07-10 NOTE — Progress Notes (Signed)
Occupational Therapy Session Note  Patient Details  Name: Darren Harrison MRN: [id number] Date of Birth: 12/19/1955  Today's Date: 07/10/2020 OT Individual Time: 1345-1441 OT Individual Time Calculation (min): 56 min   Short Term Goals: Week 1:  OT Short Term Goal 1 (Week 1): Pt will perform self feeding with Min A with AE PRN OT Short Term Goal 2 (Week 1): Pt will improve trunk control/sitting balance to sit EOB/EOM for 5 mins with no more than Min A OT Short Term Goal 3 (Week 1): Pt will complete sit <> stands in [place] with Max of 1  Skilled Therapeutic Interventions/Progress Updates:    Pt greeted in bed with no c/o pain. C-collar donned and then maxi pad was placed with +2 assist, pt rolling Rt>Lt with Mod A. Maxi transfer completed to TIS and then pt was able to assist with pushing both feet into his shoes. Teds were already donned. Pt was motivated to go outside. Escorted him via w/c to the outdoor patio. Worked on trunk control by anterior/posterior weight shifting to target. When he began having increased c/o neck pain, he was reclined in the TIS, pain reportedly absolved. Guided him through active and active assist ROM for B UEs, pt with more strength/ROM on his Lt side and able to assist with ROM on the Rt side given instruction. Noted increased tightness in Rt forearm supinators. Advised daily stretching to prevent contracture/deformity development. He verbalized understanding. Pt was then returned to the room via w/c and completed maxi transfer back to bed. Pt remained comfortably in the bed at close of session, c-collar removed, left in care of RN.   Therapy Documentation Precautions:  Precautions Precautions: Cervical,Fall Precaution Comments: reviewed precautions Required Braces or Orthoses: Cervical Brace Cervical Brace: Hard collar,Other (comment) (donned in sitting) Restrictions Weight Bearing Restrictions: No ADL: ADL Eating: Not assessed Grooming: Not assessed Upper  Body Bathing: Maximal assistance Lower Body Bathing: Dependent Upper Body Dressing: Maximal assistance Lower Body Dressing: Dependent Toileting: Dependent Toilet Transfer:  (stedy +2)      Therapy/Group: Individual Therapy  Andrew Soria A Royer Cristobal 07/10/2020, 3:59 PM

## 2020-07-10 NOTE — Progress Notes (Signed)
Physical Therapy Session Note  Patient Details  Name: Darren Harrison MRN: [id number] Date of Birth: 1955/05/03  Today's Date: 07/10/2020 PT Individual Time: 0900-1010; 1115-1200 PT Individual Time Calculation (min): 70 min and 45 min  Short Term Goals: Week 1:  PT Short Term Goal 1 (Week 1): Pt will complete bed mobility with assist x 1 consistently PT Short Term Goal 2 (Week 1): Pt will initiate slide board transfers PT Short Term Goal 3 (Week 1): Pt will maintain sitting balance x 5 min with min A  Skilled Therapeutic Interventions/Progress Updates:    Session 1: Pt received supine in bed, agreeable to PT session. No complaints of pain at rest, has some R shoulder pain and cervical incision pain with mobility. Rolling L/R with min A for donning of pants with total A. Total A to don knee-high TEDs and shoes at bed level. Pt is assist x 2 with use of B bedrails to achieve long-sitting position. Dependent to doff hospital gown, able to don shirt with mod A. Maxi move transfer to [place] chair. Sit to stand with total A x 2 to stedy. Pt unable to stand fully upright due to LE and core weakness, able to stand enough to place stedy seat. From perched stedy seat pt is dependent to stand for removal of seat in order to sit down on mat table. Patient transitioned to supported sitting position on mat table with therapy ball behind patient. Seated anterior leans with focus on achieving upright posture, use of BUE support on mat table and min A needed, unable to maintain position for longer than a few seconds due to pain and fatigue. Seated BLE strengthening therex from supported position while EOM: LAQ, marches x 10 reps each. Pt exhibits RLE weakness > LLE weakness. Slide board transfer back to chair with total A x 2. Pt left semi-reclined in TIS chair in room with needs in reach at end of session.  Session 2: Pt received seated in TIS chair in room, agreeable to PT session. Tilted patient back as far as  chair allows for pressure relief x 2 min. Seated B HS and gastroc stretches 3 x 60 sec each. Pt exhibits BLE flexor tone and clonus, R>L. Seated in TIS chair with BLE placed on mat table for "long-sitting" position with back supported by w/c. In this position pt able to perform B quad sets and heel slides with AAROM x 10 reps each. Seated B hip add squeeze on therapy ball x 10 reps each. Pt requests to remain seated up in chair at end of session, needs in reach.  Therapy Documentation Precautions:  Precautions Precautions: Cervical,Fall Precaution Comments: reviewed precautions Required Braces or Orthoses: Cervical Brace Cervical Brace: Hard collar,Other (comment) (donned in sitting) Restrictions Weight Bearing Restrictions: No    Therapy/Group: Individual Therapy   [name], PT, DPT, CSRS  07/10/2020, 12:08 PM

## 2020-07-10 NOTE — Plan of Care (Signed)
  Problem: SCI BOWEL ELIMINATION Goal: RH STG MANAGE BOWEL WITH ASSISTANCE Description: STG Manage Bowel with  min Assistance. Outcome: Not Progressing Goal: RH STG SCI MANAGE BOWEL PROGRAM W/ASSIST OR AS APPROPRIATE Description: STG SCI Manage bowel program w/ min assist or as appropriate. Outcome: Not Progressing   Problem: SCI BLADDER ELIMINATION Goal: RH STG MANAGE BLADDER WITH ASSISTANCE Description: STG Manage Bladder With  min Assistance Outcome: Not Progressing Goal: RH STG MANAGE BLADDER WITH MEDICATION WITH ASSISTANCE Description: STG Manage Bladder With Medication With  mod I Assistance. Outcome: Not Progressing

## 2020-07-10 NOTE — Progress Notes (Signed)
Bowel program started at 1825. Pt disimpacted for large amount of hard lumps of stool Some mushy stool present that was unable to be removed with disimpaction. Dulcolax suppository inserted at 1845. Reported off to oncoming staff.   Gerald Stabs, RN

## 2020-07-11 ENCOUNTER — Inpatient Hospital Stay (HOSPITAL_COMMUNITY): Payer: Managed Care, Other (non HMO)

## 2020-07-11 DIAGNOSIS — G959 Disease of spinal cord, unspecified: Secondary | ICD-10-CM | POA: Diagnosis not present

## 2020-07-11 LAB — BASIC METABOLIC PANEL
Anion gap: 6 (ref 5–15)
BUN: 14 mg/dL (ref 8–23)
CO2: 29 mmol/L (ref 22–32)
Calcium: 8.8 mg/dL — ABNORMAL LOW (ref 8.9–10.3)
Chloride: 100 mmol/L (ref 98–111)
Creatinine, Ser: 0.67 mg/dL (ref 0.61–1.24)
GFR, Estimated: >60 mL/min (ref >60)
Glucose, Bld: 89 mg/dL (ref 70–99)
Potassium: 3.8 mmol/L (ref 3.5–5.1)
Sodium: 135 mmol/L (ref 135–145)

## 2020-07-11 MED ORDER — DOCUSATE SODIUM 100 MG PO CAPS
100.0000 mg | ORAL_CAPSULE | Freq: Every day | ORAL | Status: DC
  Administered 2020-07-12: 100 mg via ORAL
  Filled 2020-07-11: qty 1

## 2020-07-11 MED ORDER — SENNA 8.6 MG PO TABS
2.0000 | ORAL_TABLET | Freq: Every day | ORAL | Status: DC
  Administered 2020-07-11 – 2020-07-21 (×11): 17.2 mg via ORAL
  Filled 2020-07-11 (×11): qty 2

## 2020-07-11 NOTE — Progress Notes (Signed)
Occupational Therapy Session Note  Patient Details  Name: Darren Harrison MRN: [id number] Date of Birth: Nov 26, 1955  Today's Date: 07/11/2020 OT Individual Time: 0903-1000 OT Individual Time Calculation (min): 57 min   Short Term Goals: Week 1:  OT Short Term Goal 1 (Week 1): Pt will perform self feeding with Min A with AE PRN OT Short Term Goal 2 (Week 1): Pt will improve trunk control/sitting balance to sit EOB/EOM for 5 mins with no more than Min A OT Short Term Goal 3 (Week 1): Pt will complete sit <> stands in [place] with Max of 1  Skilled Therapeutic Interventions/Progress Updates:    Pt greeted in bed with some c/o Rt shoulder pain. Utilized positioning strategies, rest breaks, and provided increased assistance to meet Rt shoulder demands throughout session to address therapeutically. He declined bathing but did want to start by changing shirt and donning pants. Max A to doff/don shirt with HOB elevated. After OT donned his c-collar, pt pulled up into modified long sitting given Mod A for pulling shirt down over trunk. Pt unable to sit in long sitting position for very long due to core weakness. Discussed benefits for hamstring stretching/trunk control. Figure 4 stretching completed when donning Teds + gripper socks, pt able to obtain and maintain figure 4 with the Lt LE without physical assistance! We celebrated! Tried bridging to pull up pants with OT stabilizing feet, however pt ultimately needing to roll during this aspect of the ADL. Light +2 assist for supine<sit with bed deflated, rising on the Lt shoulder. Mod balance assist while bariatric Charlaine Dalton was being set up. Light +2 assist for sit<stand in [place], bed elevated. Pt with decreased hip extension in standing and therefore would recommend bari stedy vs standard for future practice. Once tilted in neutral position, worked on UE ROM/NMR by engaging in face washing and oral care tasks. Pt able to reach faucets to turn water on/off with the  Lt hand with CGA-min guidance. He was then reclined and repositioned for comfort. Continued working on functional hand skills by using his cell phone to show therapist and RT pictures from his son's wedding. Offered to provide pt with extended tubing to increase ease of cell phone use with pt declining, preferring to use his hands to meet task demands. He was left sitting up with all needs within reach, wearing seat belt, anticipating next therapist.    Therapy Documentation Precautions:  Precautions Precautions: Cervical,Fall Precaution Comments: reviewed precautions Required Braces or Orthoses: Cervical Brace Cervical Brace: Hard collar,Other (comment) (donned in sitting) Restrictions Weight Bearing Restrictions: No Pain: Pain Assessment Pain Scale: 0-10 Pain Score: 0-No pain ADL: ADL Eating: Not assessed Grooming: Not assessed Upper Body Bathing: Maximal assistance Lower Body Bathing: Dependent Upper Body Dressing: Maximal assistance Lower Body Dressing: Dependent Toileting: Dependent Toilet Transfer:  (stedy +2)      Therapy/Group: Individual Therapy  Brittini Brubeck A Breion Novacek 07/11/2020, 12:49 PM

## 2020-07-11 NOTE — Progress Notes (Signed)
Physical Therapy Session Note  Patient Details  Name: Darren Harrison MRN: [id number] Date of Birth: 1955/02/23  Today's Date: 07/11/2020 PT Individual Time: 1000-1040 PT Individual Time Calculation (min): 40 min   Short Term Goals: Week 1:  PT Short Term Goal 1 (Week 1): Pt will complete bed mobility with assist x 1 consistently PT Short Term Goal 2 (Week 1): Pt will initiate slide board transfers PT Short Term Goal 3 (Week 1): Pt will maintain sitting balance x 5 min with min A  Skilled Therapeutic Interventions/Progress Updates:   Received pt sitting in TIS WC, pt agreeable to therapy, and reported pain in neck and in R shoulder throughout session but did not state level and declined any pain interventions. Session with emphasis on functional mobility/transfers, generalized strengthening, dynamic sitting/standing balance/coordination, core activation, and improved activity tolerance. Pt transported to therapy gym in TIS WC total A and transferred sit<>stand in [place] with max A +2 x 2 trials. Pt able to stand long enough to slide steady flaps under buttocks but unable to come into upright position and activate hip extensors. While sitting on Stedy flaps worked on dynamic sitting balance and core control picking up 2 horseshoes with RUE and crossing midline to L side of Stedy with min A of 1 for sitting balance prior to reporting fatigue. Pt with decreased RUE grip strength and R shoulder pain (possible old RTC tear) making it difficulty to actually throw horseshoe. Transitioned to LUE hand lifts from [facility] bar 2x5 reps with heavier min A for sitting balance with emphasis on core activation and upright posture; cues for upright gaze and scapular retraction. Returned to sitting due to fatigue and performed the following exercises sitting in TIS WC with 6in step supporting BLEs: -LAQ x10 on LLE and AAROM x10 on RLE -hip flexion x10 bilaterally (decreased ROM on RLE) -hip abd/add unresisted  x10 -bicep curls x10 with 1lb dowel (manual assist to grip RUE around dowel) Pt transported back to room in TIS WC total A and requested to remain sitting up. Concluded session with pt sitting in TIS WC, needs within reach, and seatbelt on.   Therapy Documentation Precautions:  Precautions Precautions: Cervical,Fall Precaution Comments: reviewed precautions Required Braces or Orthoses: Cervical Brace Cervical Brace: Hard collar,Other (comment) (donned in sitting) Restrictions Weight Bearing Restrictions: No  Therapy/Group: Individual Therapy Alfonse Alpers PT, DPT   07/11/2020, 7:35 AM

## 2020-07-11 NOTE — Progress Notes (Signed)
Bowel program started at 1817. Patient was disimpacted medium mushy stool was returned. Suppository inserted. Patient tolerated well. Oncoming nurse notified. Idamae Schuller, LPN

## 2020-07-11 NOTE — Progress Notes (Addendum)
Physical Therapy Session Note  Patient Details  Name: Darren Harrison MRN: [id number] Date of Birth: [birth year]  Today's Date: 07/11/2020 PT Individual Time: 1415-1525 PT Individual Time Calculation (min): 70 min   Short Term Goals: Week 1:  PT Short Term Goal 1 (Week 1): Pt will complete bed mobility with assist x 1 consistently PT Short Term Goal 2 (Week 1): Pt will initiate slide board transfers PT Short Term Goal 3 (Week 1): Pt will maintain sitting balance x 5 min with min A Week 2:     Skilled Therapeutic Interventions/Progress Updates:    Pain:  Pt reports 2/10 shoulder pain.  Treatment to tolerance.  Rest breaks and repositioning as needed using tilt feature of wc.. *significantly  less issues w/spasticity today. Pt initially oob in wc  and agreeable to treatment session w/focus on LE strength, core strength, standing tolerance.  Pt transported to gym  In wc w/LEs on mat in "long sitting" pt performed: Quad sets 2 x20 Hip/knee flexion/extension 2x10, AAROM RLE Hip abd/add 2x15 AAROM Seated upright LAQ L w/3lb wt 2x15, RAROM w /quad lag 2x10 In supine (when returned to bed) bridging x 8  Pt then educated w/use of standing frame. Pt brought to standing w/mechanical lift and tolerated 15 min w/UEs supported on lap tray + yoga blocks,  upright activties as follows: Quad sets x 15 Glut sets 2 x10 Scapular retraction x 10 Trunk extension to upright posture and hold x 10 repeated x 5 Hip extension AAROM in semicrouched position and attempts to hold upright via glut activation x 5 then added trunk + hips + quads for challenghing upright control activation w/mod assist, max cues, difficulty coordinating all together. BP in standing 87/53, no c/o, no symptoms of OH BP in sitting 104/74  Pt repositioned in wc +2 total assist. wc to bed via Steady transfer w/mod assist of 2 fading to max assist due to fatigue. Sit to supine max assist of 2. Cervical collar removed per pt request.   Pt left supine w/rails up x 4, alarm set, bed in lowest position, and needs in reach.  Very good progress w/trunk activation, LE strength, upright control.  Would consider further standing activities.    Therapy Documentation Precautions:  Precautions Precautions: Cervical,Fall Precaution Comments: reviewed precautions Required Braces or Orthoses: Cervical Brace Cervical Brace: Hard collar,Other (comment) (donned in sitting) Restrictions Weight Bearing Restrictions: No    Therapy/Group: Individual Therapy  Callie Fielding, [place] 07/11/2020, 3:37 PM

## 2020-07-11 NOTE — Progress Notes (Signed)
Continue bowel program, disimpacted pt and large amount of soft mushy stool with brown color removed. Patient tolerated care well.
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
## 2020-07-11 NOTE — Progress Notes (Deleted)
Patient ID: Darren Harrison, male   DOB: 06/22/1955, 65 y.o.   MRN: [id number]  SW made efforts to meet with pt to complete assessment, however pt was tired. SW met pt, pt wife, and pt son. SW introduce self, explained role, and discussed discharge process. Wife reports that they are moving into [place] next week on 6/6 and will not be available as often to be in. Pt son reports he is also moving into a new home on 6/7 and will have limited time here. Family aware SW to follow-up with [facility] to discuss possible options based on pt care needs.   SW informed will complete assessment when pt is more awake. SW to follow-up tomorrow after team conference.   SW spoke with Maryan Puls at [facility] in healthcare dept [phone]) to inquire about options for pt based on pt care needs. Will follow-up after speaking with staff in [place]. Also reports, if pt requires [facility], hospital SW can setup. SW waiting on follow-up.   Loralee Pacas, MSW, [place] Office: [phone] Cell: [phone] Fax: [phone]

## 2020-07-12 ENCOUNTER — Telehealth: Payer: Self-pay

## 2020-07-12 DIAGNOSIS — K592 Neurogenic bowel, not elsewhere classified: Secondary | ICD-10-CM | POA: Diagnosis not present

## 2020-07-12 DIAGNOSIS — N319 Neuromuscular dysfunction of bladder, unspecified: Secondary | ICD-10-CM | POA: Diagnosis not present

## 2020-07-12 DIAGNOSIS — G959 Disease of spinal cord, unspecified: Secondary | ICD-10-CM | POA: Diagnosis not present

## 2020-07-12 DIAGNOSIS — L89152 Pressure ulcer of sacral region, stage 2: Secondary | ICD-10-CM | POA: Diagnosis not present

## 2020-07-12 DIAGNOSIS — R252 Cramp and spasm: Secondary | ICD-10-CM | POA: Diagnosis not present

## 2020-07-12 MED ORDER — METOPROLOL TARTRATE 12.5 MG HALF TABLET
12.5000 mg | ORAL_TABLET | Freq: Every day | ORAL | Status: DC
  Administered 2020-07-13 – 2020-07-27 (×14): 12.5 mg via ORAL
  Filled 2020-07-12 (×15): qty 1

## 2020-07-12 MED ORDER — DOCUSATE SODIUM 100 MG PO CAPS
100.0000 mg | ORAL_CAPSULE | Freq: Every day | ORAL | Status: DC | PRN
  Administered 2020-07-18: 100 mg via ORAL

## 2020-07-12 MED ORDER — METOCLOPRAMIDE HCL 5 MG PO TABS
5.0000 mg | ORAL_TABLET | Freq: Three times a day (TID) | ORAL | Status: DC
  Administered 2020-07-12 – 2020-07-16 (×17): 5 mg via ORAL
  Filled 2020-07-12 (×17): qty 1

## 2020-07-12 MED ORDER — CITALOPRAM HYDROBROMIDE 10 MG PO TABS
20.0000 mg | ORAL_TABLET | Freq: Every day | ORAL | Status: DC
  Administered 2020-07-13 – 2020-07-25 (×13): 20 mg via ORAL
  Filled 2020-07-12 (×15): qty 2

## 2020-07-12 MED ORDER — LIDOCAINE HCL 1 % IJ SOLN
10.0000 mL | Freq: Once | INTRAMUSCULAR | Status: AC
  Administered 2020-07-12: 10 mL
  Filled 2020-07-12: qty 10

## 2020-07-12 MED ORDER — TRIAMCINOLONE ACETONIDE 40 MG/ML IJ SUSP
80.0000 mg | Freq: Once | INTRAMUSCULAR | Status: AC
  Administered 2020-07-12: 80 mg via INTRAMUSCULAR
  Filled 2020-07-12: qty 2

## 2020-07-12 NOTE — Telephone Encounter (Signed)
Patient's wife called and said he had neck surgery and is suppose to be in the hospital about 30 days. He was supposed to do labs this week and see you 6/7. Would you be able to do a phone visit with him if he does the bloodwork at [facility]?

## 2020-07-12 NOTE — Telephone Encounter (Signed)
Yes

## 2020-07-12 NOTE — Progress Notes (Signed)
Patient ID: Darren Harrison, male   DOB: [birth year], 65 y.o.   MRN: [id number]  SW met with pt in room to provide updates from team conference, and d/c date 6/22. SW shared with pt concerns related to pt not being at an independent level of care when he goes home. SW discussed with pt how would he have 24/7 care at home if his wife is continuing to work. Pt was unsure. SW discussed options such as home with [facility] for therapies, and potentially hiring a private aid; or short term rehab in SNF until he is more independent to transition to home. SW provided pt with HHA list from [place], and SNF list from [place]. SW informed pt will follow-up with his wife.   SW spoke with pt wife Silva Bandy [phone]) to provide above updates. Wife reports he was almost physically the same prior to admission to hospital, and he would use the potty and she would empty once she came home. Wife is hopeful he will continue to make gains, and will wait for updates next week. SW discussed short term rehab, and home with [facility] and considering a Charity fundraiser or natural support that can assist while she is at work.SW discussed family education closer towards patient discharge date. SW also encouraged reviewing [facility] list so we can begin to explore HHA options since HHA will require [employer] authorization prior to beginning services. SW to follow-up after team conference next week.   Loralee Pacas, MSW, [place] Office: [phone] Cell: [phone] Fax: [phone]

## 2020-07-12 NOTE — Progress Notes (Signed)
Bowel completed, patient had a large soft bowel movement post protocol implementation

## 2020-07-12 NOTE — Progress Notes (Signed)
Occupational Therapy Weekly Progress Note  Patient Details  Name: Darren Harrison MRN: [id number] Date of Birth: [birth year]  Beginning of progress report period: Jul 06, 2020 End of progress report period: Jul 12, 2020  Patient has met 0 of 3 short term goals.  Pt has made minimal functional progress since admission. Focus on bed mobility, sitting balance, sit<>stand with Stedy, BUE strengthening, and OOB tolerance. Rolling R/L with min A using bed rails. Supine<>sit EOB with max A+2. Sit<>stand in [place] with max A+2 but unable to maintain standing 2/2 limited core stability. Pt with limited Rt shoulder AROM. Pt currently on night bath. UB dressing with max A. Dependent for LB dressing. Static sitting balance with min A. Dynamic sitting balance with max A.   Patient continues to demonstrate the following deficits: muscle weakness, decreased cardiorespiratoy endurance, impaired timing and sequencing, abnormal tone and unbalanced muscle activation and decreased sitting balance, decreased standing balance and decreased postural control and therefore will continue to benefit from skilled OT intervention to enhance overall performance with BADL and Reduce care partner burden.  Patient progressing toward long term goals..  Continue plan of care.  OT Short Term Goals Week 1:  OT Short Term Goal 1 (Week 1): Pt will perform self feeding with Min A with AE PRN OT Short Term Goal 2 (Week 1): Pt will improve trunk control/sitting balance to sit EOB/EOM for 5 mins with no more than Min A OT Short Term Goal 3 (Week 1): Pt will complete sit <> stands in [place] with Max of 1 Week 2:  OT Short Term Goal 1 (Week 2): Pt will perform self feeding with Min A with AE PRN OT Short Term Goal 2 (Week 2): Pt will improve trunk control/sitting balance to sit EOB/EOM for 5 mins with no more than Min A OT Short Term Goal 3 (Week 2): Pt will complete sit <> stands in [place] with Max of 1        [name] 07/12/2020, 11:15 AM
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
## 2020-07-12 NOTE — Progress Notes (Signed)
Occupational Therapy Session Note  Patient Details  Name: Darren Harrison MRN: [id number] Date of Birth: 1955-05-20  Today's Date: 07/12/2020 OT Individual Time: 1000-1058 OT Individual Time Calculation (min): 58 min    Short Term Goals: Week 1:  OT Short Term Goal 1 (Week 1): Pt will perform self feeding with Min A with AE PRN OT Short Term Goal 2 (Week 1): Pt will improve trunk control/sitting balance to sit EOB/EOM for 5 mins with no more than Min A OT Short Term Goal 3 (Week 1): Pt will complete sit <> stands in [place] with Max of 1  Skilled Therapeutic Interventions/Progress Updates:    OT intervention with focus on sitting balance, BUE strengthing/function, and activity tolerance to increace independence with BADLs. Pt resting in w/c upon arrival. Tranfser to EOM with STedy (max A+2). Sitting balance EOM with max A while completing chest passes with small beach ball. Pt UE fatigue after 5 tosses and requires rest breaks. Pt completed 4 sets of 5. Sit>supine with max A+2. BUE strengthening with beach ball. Chest presses with focus on applying adequate force to keep ball at midline and pressing towards ceiling. Pt completed 4x5 with AAROM of RUE to achieve elbow extension. Pt requested to return to bed. Transfer to bed with Stedy-max A+3. Sit>supine with max A+2. Pt remained in bed with all needs within reach.   Therapy Documentation Precautions:  Precautions Precautions: Cervical,Fall Precaution Comments: reviewed precautions Required Braces or Orthoses: Cervical Brace Cervical Brace: Hard collar,Other (comment) (donned in sitting) Restrictions Weight Bearing Restrictions: No   Pain: Pt stated his Rt shoulder was feeling better following injection by MD  Therapy/Group: Individual Therapy  [name] 07/12/2020, 10:59 AM
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
## 2020-07-12 NOTE — Progress Notes (Signed)
Occupational Therapy Session Note  Patient Details  Name: Darren Harrison MRN: [id number] Date of Birth: [birth year]  Today's Date: 07/12/2020 OT Individual Time: 3143-8887 OT Individual Time Calculation (min): 58 min    Short Term Goals: Week 1:  OT Short Term Goal 1 (Week 1): Pt will perform self feeding with Min A with AE PRN OT Short Term Goal 2 (Week 1): Pt will improve trunk control/sitting balance to sit EOB/EOM for 5 mins with no more than Min A OT Short Term Goal 3 (Week 1): Pt will complete sit <> stands in [place] with Max of 1  Skilled Therapeutic Interventions/Progress Updates:    Pt resting in bed upon arrival and ready to get OOB. Ted hose donned at bed level. Pt incontinent of bowel and dependent for hygiene and changing brief at bed level. Supine>sit EOB with max A+1. Sitting balance EOB with max A. Sit<>stand in [place] with max A+2. Pt unable to remain in standing position. Transfer to w/c with Stedy and transported to gym in TIS w/c. Transfer to EOM. Focus on sitting balance while reaching for horseshoes with BUE. Pt required max A for sitting balance. Pt fatigued quickly after 5 trials and required rest breaks. Pt completed 3 trials and transferred back to w/c with Stedy. Pt remained in w/c for table tasks-removing large pegs from peg board. Pt fatigued while replacing pegs in board and unable to complete task. Pt returned to room and remained in w/c with all needs within reach. OT focus on sitting balance and BUE strengthening/funcitonal use.  Therapy Documentation Precautions:  Precautions Precautions: Cervical,Fall Precaution Comments: reviewed precautions Required Braces or Orthoses: Cervical Brace Cervical Brace: Hard collar,Other (comment) (donned in sitting) Restrictions Weight Bearing Restrictions: No   Pain:  Pt c/o chronic Rt shoulder pain; activity and repositoned   Therapy/Group: Individual Therapy  [name] 07/12/2020, 9:07 AM

## 2020-07-12 NOTE — Patient Care Conference (Signed)
Inpatient RehabilitationTeam Conference and Plan of Care Update Date: 07/12/2020   Time: 11:04 AM    Patient Name: Darren Harrison      Medical Record Number: [id number]  Date of Birth: 10/20/1955 Sex: Male         Room/Bed: 4M10C/4M10C-01 Payor Info: Payor: [employer] / Plan: CIGNA MANAGED / Product Type: *No Product type* /    Admit Date/Time:  07/05/2020  3:07 PM  Primary Diagnosis:  Cervical myelopathy [facility])  Hospital Problems: Principal Problem:   Cervical myelopathy ([place]) Active Problems:   Pressure injury of skin   Neurogenic bowel   Neurogenic bladder   Spasticity    Expected Discharge Date: Expected Discharge Date: 08/03/20  Team Members Present: Physician leading conference: Dr. Courtney Heys Care Coodinator Present: Loralee Pacas, LCSWA;Antoino Westhoff Creig Hines, RN, BSN, CRRN Nurse Present: Dorthula Nettles, RN PT Present: [name], PT OT Present: Roanna Epley, COTA;Jennifer Tamala Julian, OT PPS Coordinator present : Gunnar Fusi, SLP     Current Status/Progress Goal Weekly Team Focus  Bowel/Bladder   daily bowel program/ protocol in place and I/O cath q6. Last BM 5/30  Patient will be able to move bowels  Assess and initiate treatment per protocol   Swallow/Nutrition/ Hydration             ADL's   max A/tot A bathing/dressing at bed level; min A rolling R/L in bed using bed rails; max A+2 supine>sit EOB; MaxiSky for transfers  min A overall  bed mobility, sitting balance, BADL retraining, activity tolerance, education   Mobility   min A rolling, +2 supine to/from sit, transfer via stedy +2  Supervision bed mobility, min A transfers, mod I w/c mobility  standing as able and tolerated, core strengthening, increasing independence with transfers, SCI edu   Communication   Able to verbalize needs  Assess and educate qshift and prn      Safety/Cognition/ Behavioral Observations            Pain   Patient verbalize leg pain or spasm which is manage by current regimen 4/10   Pain will be 3/10      Skin   Intact, closed stage 2 to sacrum  Skin will be free from any injuries  Skin will remain intact     Discharge Planning:  D/c to home with intermittent support from wife who will visit during the her lunch break; works 15 minutes away. Pt will need to be Mod I or intermittent support at discharge.   Team Discussion: Patient is ASIA-C, C4, did not want to do the bowel program and now as an ileus. Will now do the bowel program. Doing I&O cath. Subacromial and glenohumeral steroid injections today. Started Reglan PO. Can shower tomorrow. Has stage II on the sacrum and reported stage III in the buttock fold. Wife will continue to work and be able to check on him during her lunch break. Patient on target to meet rehab goals: yes, Min assist goals, getting better core control. Will be at W/C level. Min assist goals, on the night bath schedule. Working on bed mobility, sitting balance, and core control.  *See Care Plan and progress notes for long and short-term goals.   Revisions to Treatment Plan:  Steroid injections to the shoulder today, Reglan started today.  Teaching Needs: Family education, medication management, pain management, bowel/bladder program, skin/wound care, transfer training, gait training, balance training, endurance training, safety awareness.  Current Barriers to Discharge: Decreased caregiver support, Medical stability, Home enviroment access/layout, Neurogenic  bowel and bladder, Wound care, Lack of/limited family support, Weight, Weight bearing restrictions, Medication compliance and Behavior  Possible Resolutions to Barriers: Continue current medications, provide emotional support.     Medical Summary Current Status: nausea gone; R shoulder pain still; incontinent x2; in/out caths- Stage II sacrum; can have shower; had ileus over the weekend- resolved  Barriers to Discharge: Decreased family/caregiver support;Home enviroment  access/layout;Incontinence;Neurogenic Bowel & Bladder;Medical stability;Wound care;Weight bearing restrictions  Barriers to Discharge Comments: only has intermittent A at home- will need min A with therapy/at d/c-goals; +2 with steady- little orthostatic- not symptomatic- will leave w/c level; Possible Resolutions to [employer] Focus: Did Subacromial and Glenohumeral steorid injections today- also changed Reglan to PO and 5 mg TID; con't i/o caths q6H; focus on bowel program at d/c; not really voiding at this time; shower tomorrow.  d/c date 08/03/20- 4 weeks total   Continued Need for Acute Rehabilitation Level of Care: The patient requires daily medical management by a physician with specialized training in physical medicine and rehabilitation for the following reasons: Direction of a multidisciplinary physical rehabilitation program to maximize functional independence : Yes Medical management of patient stability for increased activity during participation in an intensive rehabilitation regime.: Yes Analysis of laboratory values and/or radiology reports with any subsequent need for medication adjustment and/or medical intervention. : Yes   I attest that I was present, lead the team conference, and concur with the assessment and plan of the team.   Cristi Loron 07/12/2020, 5:01 PM

## 2020-07-12 NOTE — Progress Notes (Signed)
Physical Therapy Session Note  Patient Details  Name: Darren Harrison MRN: [id number] Date of Birth: 23-Sep-1955  Today's Date: 07/12/2020 PT Individual Time: 5732-2025 PT Individual Time Calculation (min): 70 min   Short Term Goals: Week 1:  PT Short Term Goal 1 (Week 1): Pt will complete bed mobility with assist x 1 consistently PT Short Term Goal 2 (Week 1): Pt will initiate slide board transfers PT Short Term Goal 3 (Week 1): Pt will maintain sitting balance x 5 min with min A  Skilled Therapeutic Interventions/Progress Updates:    Pt received supine in bed, agreeable to PT session. No complaints of pain this date. Assisted pt with donning shoes at bed level dependently. Supine bridges x 10 reps with assist for stabilizing BLE on bed. Supine to sit with max A for BLE management and trunk elevation, improvement from last week when pt requiring assist x 2. Pt with heavy L lateral lean in sitting, mod A to maintain sitting balance. Sit to stand with max A x 2 to stedy. Stedy transfer to w/c. Pt increases in assist level from max A x 2 to stedy to total A x 2 from perched position on stedy with onset of fatigue. Sit to stand in standing frame x 3 reps. Pt able to maintain good core control in standing position this date, some assist needed for BUE placement on standing frame platform with use of yoga blocks for improved support and positioning in standing. Seated BP 99/71, standing BP 103/62 with pt remaining asymptomatic. Education regarding purpose of monitoring BP, expected changes in BP with position change and exercise, and orthostatic hypotension. Standing lateral weight shifts L/R with max A needed to shift weight to the R, glute sets x 10 reps, quad sets x 10 reps, alt UE lifts x 10 reps, BUE lifts x 10 reps, mini mini squats x 10 reps. Pt requests to remain seated in TIS chair at end of session. Pt left semi-reclined in chair with needs in reach at end of session.  Therapy  Documentation Precautions:  Precautions Precautions: Cervical,Fall Precaution Comments: reviewed precautions Required Braces or Orthoses: Cervical Brace Cervical Brace: Hard collar,Other (comment) (donned in sitting) Restrictions Weight Bearing Restrictions: No   Therapy/Group: Individual Therapy   [name], PT, DPT, CSRS  07/12/2020, 3:30 PM

## 2020-07-13 DIAGNOSIS — G959 Disease of spinal cord, unspecified: Secondary | ICD-10-CM | POA: Diagnosis not present

## 2020-07-13 NOTE — Progress Notes (Signed)
Physical Therapy Weekly Progress Note  Patient Details  Name: Darren Harrison MRN: [id number] Date of Birth: [birth year]  Beginning of progress report period: Jul 06, 2020 End of progress report period: July 13, 2020  Today's Date: 07/13/2020 PT Individual Time: 1300-1415 PT Individual Time Calculation (min): 75 min   Patient has met 2 of 3 short term goals.  Pt is making slow but steady progress towards therapy goals. He is min A for rolling in hospital bed with use of bedrails, max A for sit to/from supine, dependent for transfers via use of lift equipment, has been able to initiate slide board transfers but requires total A x 2 for safety, and is able to maintain sitting balance with mod to max A. Pt has improved from utilizing a maxi move lift for transfers to using a stedy due to improved trunk control. Pt remains very motivated and exhibits good participation in therapy sessions.  Patient continues to demonstrate the following deficits muscle weakness and muscle joint tightness, decreased cardiorespiratoy endurance, abnormal tone and unbalanced muscle activation and decreased sitting balance, decreased standing balance, decreased postural control and decreased balance strategies and therefore will continue to benefit from skilled PT intervention to increase functional independence with mobility.  Patient progressing toward long term goals..  Continue plan of care.  PT Short Term Goals Week 1:  PT Short Term Goal 1 (Week 1): Pt will complete bed mobility with assist x 1 consistently PT Short Term Goal 1 - Progress (Week 1): Met PT Short Term Goal 2 (Week 1): Pt will initiate slide board transfers PT Short Term Goal 2 - Progress (Week 1): Met PT Short Term Goal 3 (Week 1): Pt will maintain sitting balance x 5 min with min A PT Short Term Goal 3 - Progress (Week 1): Progressing toward goal Week 2:  PT Short Term Goal 1 (Week 2): Pt will perform bed mobility with mod A consistently PT Short  Term Goal 2 (Week 2): Pt will perform least restrictive transfer with assist x 1 PT Short Term Goal 3 (Week 2): Pt will maintain standing with LRAD x 5 min PT Short Term Goal 4 (Week 2): Pt will maintain sitting balance x 5 min with min A  Skilled Therapeutic Interventions/Progress Updates:    Pt received seated in TIS chair in room having just finished lunch. Assisted pt with changing shirt due to having soiled it while eating spaghetti for lunch. Pt is mod A to change his shirt. Dependent transport via [place] chair to/from therapy gym. Sit to stand in stedy with mod A x 2. Placed sheet around patient's hips for improved ability to assist pt with lateral weight shifts. Use of mirror in standing for visual feedback. Pt tends to stand with weight towards the L, requires visual and manual cues to shift to midline. Standing lateral L/R weight shifts with assist from sheet for weight shifting. Returned to sitting in [place] chair. Assisted pt into "long-sitting" position on mat table with LE on mat table while seated in TIS chair. Seated quad sets and SAQ x 10 reps each. Assisted pt with stretching out B heel cords in long-sitting position. Reviewed importance of wearing PRAFOs at night and educated patient that wife can perform LE stretching with him when she is visiting. Pt continues to exhibit tight HS bilterally, R>L. Pt requests to return to bed at end of session due to fatigue. Sit to stand with mod A x 2 to stedy. Stedy transfer to bed. Sit to  supine max A for BLE management and trunk control. Pt found to be incontinent of urine and bowel in his brief. Pt is dependent for pericare and brief change. Pt left in care of nursing for bladder scan and I/O cath.  Therapy Documentation Precautions:  Precautions Precautions: Cervical,Fall Precaution Comments: reviewed precautions Required Braces or Orthoses: Cervical Brace Cervical Brace: Hard collar,Other (comment) (donned in sitting) Restrictions Weight Bearing  Restrictions: No   Therapy/Group: Individual Therapy   [name], PT, DPT, CSRS  07/13/2020, 7:43 AM

## 2020-07-13 NOTE — Progress Notes (Signed)
Occupational Therapy Session Note  Patient Details  Name: LUISALBERTO BEEGLE MRN: [id number] Date of Birth: Mar 04, 1955  Today's Date: 07/13/2020 OT Individual Time: 2820-6015 OT Individual Time Calculation (min): 28 min    Short Term Goals: Week 1:  OT Short Term Goal 1 (Week 1): Pt will perform self feeding with Min A with AE PRN OT Short Term Goal 2 (Week 1): Pt will improve trunk control/sitting balance to sit EOB/EOM for 5 mins with no more than Min A OT Short Term Goal 3 (Week 1): Pt will complete sit <> stands in [place] with Max of 1 Week 2:  OT Short Term Goal 1 (Week 2): Pt will perform self feeding with Min A with AE PRN OT Short Term Goal 2 (Week 2): Pt will improve trunk control/sitting balance to sit EOB/EOM for 5 mins with no more than Min A OT Short Term Goal 3 (Week 2): Pt will complete sit <> stands in [place] with Max of 1   Skilled Therapeutic Interventions/Progress Updates:    pt greeted at time of session sleeping in bed but easily woken and agreeable to OT session, motivated to participate. Focus of session on core strength/sitting balance EOB. Supine > sit Min/CGA with extended time and bed features, Min scooting to EOB. Mod fading to min/CGA for sitting balance with c-collar on performed bicep curl, chest press 1x10-15 reps with 1# dowel for core strengthening. 2x10 postural control exercises focused on upright posture and preventing posterior pelvic tilt. 1x10 lateral leans on each elbow as well for core strength. Sit > supine Min/Mod for LEs and scooting up toward [place] Mod A. Alarm on call bell in reach.   Therapy Documentation Precautions:  Precautions Precautions: Cervical,Fall Precaution Comments: reviewed precautions Required Braces or Orthoses: Cervical Brace Cervical Brace: Hard collar,Other (comment) (donned in sitting) Restrictions Weight Bearing Restrictions: No     Therapy/Group: Individual Therapy  Viona Gilmore 07/13/2020, 4:50 PM

## 2020-07-13 NOTE — Progress Notes (Signed)
Patient had large soft and mushy brown bowel post initiation of bowel program

## 2020-07-13 NOTE — Progress Notes (Signed)
PROGRESS NOTE   Subjective/Complaints:  Pt reports shoulder feels a little to somewhat better after injections.  Spasms very few, and usually less intense, but this AM, had a few pretty strong.   Slept well.  BM x2 with bowel program- went well.   Asking if wife can spend the night? ROS:   Pt denies SOB, abd pain, CP, N/V/C/D, and vision changes   Objective:   No results found. No results for input(s): WBC, HGB, HCT, PLT in the last 72 hours. Recent Labs    07/11/20 0502  NA 135  K 3.8  CL 100  CO2 29  GLUCOSE 89  BUN 14  CREATININE 0.67  CALCIUM 8.8*    Intake/Output Summary (Last 24 hours) at 07/13/2020 0800 Last data filed at 07/13/2020 0730 Gross per 24 hour  Intake 947 ml  Output 1125 ml  Net -178 ml     Pressure Injury 06/28/20 Thigh Left;Posterior;Proximal Stage 3 -  Full thickness tissue loss. Subcutaneous fat may be visible but bone, tendon or muscle are NOT exposed. (Active)  06/28/20 1600 (Assessed at office prior to admission)  Location: Thigh  Location Orientation: Left;Posterior;Proximal  Staging: Stage 3 -  Full thickness tissue loss. Subcutaneous fat may be visible but bone, tendon or muscle are NOT exposed.  Wound Description (Comments):   Present on Admission: Yes     Pressure Injury 07/05/20 Coccyx Mid Stage 1 -  Intact skin with non-blanchable redness of a localized area usually over a bony prominence. (Active)  07/05/20 1540  Location: Coccyx  Location Orientation: Mid  Staging: Stage 1 -  Intact skin with non-blanchable redness of a localized area usually over a bony prominence.  Wound Description (Comments):   Present on Admission: Yes    Physical Exam: Vital Signs Blood pressure 97/66, pulse (!) 54, temperature 98.2 F (36.8 C), resp. rate 16, height 5\' 10"  (1.778 m), weight 74.8 kg, SpO2 97 %.        General: awake, alert, appropriate, sitting up in bed;  NAD HENT:  conjugate gaze; oropharynx moist CV: bradycardic rate; no JVD Pulmonary: CTA B/L; no W/R/R- good air movement GI: soft, NT, ND, (+)BS Psychiatric: appropriate Neurological: Ox3; MAS of 1+ in LEs- a few spasms seen Musculoskeletal:  General: No swelling. 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 Sensation: RUE C4 ok- decreased from C5 to S1 on R LUE- C5 OK- decreased from  C6 to S1 on L   Assessment/Plan: 1. Functional deficits which require 3+ hours per day of interdisciplinary therapy in a comprehensive inpatient rehab setting.  Physiatrist is providing close team supervision and 24 hour management of active medical problems listed below.  Physiatrist and rehab team continue to assess barriers to discharge/monitor patient progress toward functional and medical goals  Care Tool:  Bathing  Bathing activity did not occur: Refused Body parts bathed by patient: Chest,Abdomen,Face   Body parts bathed by helper: Right arm,Left arm,Front perineal area,Buttocks,Right upper leg,Left upper leg,Right lower leg,Left lower leg     Bathing assist Assist Level: Total Assistance - Patient < 25%     Upper Body Dressing/Undressing Upper body dressing   What is the patient wearing?: Pull  over shirt,Orthosis    Upper body assist Assist Level: Maximal Assistance - Patient 25 - 49%    Lower Body Dressing/Undressing Lower body dressing      What is the patient wearing?: Pants     Lower body assist Assist for lower body dressing: Maximal Assistance - Patient 25 - 49%     Toileting Toileting    Toileting assist Assist for toileting: Dependent - Patient 0%     Transfers Chair/bed transfer  Transfers assist     Chair/bed transfer assist level: Dependent - mechanical lift (stedy)     Locomotion Ambulation   Ambulation assist   Ambulation activity did not occur: Safety/medical concerns          Walk 10 feet activity   Assist  Walk 10 feet activity did not occur: Safety/medical concerns        Walk 50 feet activity   Assist Walk 50 feet with 2 turns activity did not occur: Safety/medical concerns         Walk 150 feet activity   Assist Walk 150 feet activity did not occur: Safety/medical concerns         Walk 10 feet on uneven surface  activity   Assist Walk 10 feet on uneven surfaces activity did not occur: Safety/medical concerns         Wheelchair     Assist Will patient use wheelchair at discharge?: Yes Type of Wheelchair:  (TBD)    Wheelchair assist level: Dependent - Patient 0% Max wheelchair distance: 150'    Wheelchair 50 feet with 2 turns activity    Assist        Assist Level: Dependent - Patient 0%   Wheelchair 150 feet activity     Assist      Assist Level: Dependent - Patient 0%   Blood pressure 97/66, pulse (!) 54, temperature 98.2 F (36.8 C), resp. rate 16, height 5\' 10"  (1.778 m), weight 74.8 kg, SpO2 97 %.  Medical Problem List and Plan: 1.C4 Tetraplegia -ASIA Csecondary tocervical myelopathy/cervical synovial cyst. Status post C3-4 laminectomy with posterior arthrodesis cervical instrumentation 06/29/2020. Cervical brace as directed. -patient may Shower  with neck brace/incision covered -ELOS/Goals: ~3 weeks- min A -will order PRAFOs and have pt wear nightly to prevent ankle contractures  5/26- con't PT and OT- spent 20 minutes going over ASIA level, and info about SCI including neurogenic bowel and bladder, spasticity and increased risk of DVT/pressure ulcers.   -ccon't PT and OT- discussed with pt, he wants wife to spend the night- will allow if she observes B/B .     2. Antithrombotics: -DVT/anticoagulation:Lovenox. Check vascular study 5/26- will need for at least 2 months- maybe 3 months total from surgery.  3. Pain Management:Baclofen 5 mg 3 times daily, oxycodone and Flexeril as needed  5/26- pain pretty well controlled- con't regimen  5/28-   con't regimen prn  5/31- will do R shoulder injection today.  6/1- pain doing somewhat better this AM- don't push too hard in first 3 days- told OT.   4. Mood:Provide emotional support  5/26- pt has been tearful understandably- will try Celexa 10 mg daily  5/31- tolerating, will increase to 20 mg daily -antipsychotic agents: N/A 5. Neuropsych: This patientiscapable of making decisions on hisown behalf. 6. Skin/Wound Care:Routine skin checks 7. Fluids/Electrolytes/Nutrition:Routine in and outs with follow-up chemistries 8. Atrial fibrillation. Lopressor 25 mg twice daily. Cardiac rate controlled  5/28 overall controlled- con't regimen  5/31- will decrease Metoprolol to 12.5 mg daily (was qday) since heart rate on low side and BP low- don't want him orthostatic due to SCI.   6/1- HR 54 this AM- they are holding Metoprolol- will con't to monitor  9. Hypertension with orthostatic hypotension. Cozaar 50 mg daily. Monitor with increased mobility  5/28 elevated today.  5/30- BP 90s/50s- will monitor for orthostatic hypotension  5/31- will decrease metoprolol as above  6/1- HR still low and BP is 90s/60s- con't regimen for now. Might need midodrine per PT,  drops with therapy, but not dizzy.  10. BPH/UTI.Was onFlomax 0.4 mg daily.UTI greater than 80,000 Staphylococcus/Enterococcus completing course of Bactrim- please see neurogenic bladder- 11. Surgical PCR screening positive. Bactroban as directed 12. Hyperthyroidism- had lost 70 lbs in 6 months- in middle of treatment  6/1- per Endo, want labs checked before appointment 6/7- will order thyroid labs for tomorrow, so they are back for appointment.  13. Fatty liver-will need to be careful with any medicine going through liver.  14. Neurogenic bladder- will start on I/o caths q6 hours prn and increase Flomax to 0.8 mg with supper  5/25 no side effects with flomax- con't regimen  5/26- went over with pt, will need in/out caths or Foley until he's able to emtpy- I cannot guarantee he will get complete function back.   5/30- voided a little yesterday- con't Flomax and in/out caths  5/31- sounds like overflow, not real voiding- will verify with nursing they are cathing q6 hours. No prn- because he's having overflow incontinence.  6/1- pt will start trying to learn in/out cathing as of tomorrow- order placed  15. Neurogenic bowel- doesn't have good control- will start with suppository- and dig stim and see if can withdraw any of them over time.   5/25- pt wants to try without suppository- I said if goes with control prior that day, can hold off bowel program, but will leave as needed  5/27- required bowel program with dig stim this evening- con't regimen prn  5/30- needs bowel program- had ileus over the weekend- is resolved  6/1- having good BMs with bowel program.  16. Spasticity- will increase Baclofen to 10 mg TID and con't Flexeril.  5/25- will d/c flexeril per pt request- and in 1-2 days, increase baclofen- NO DANTROLENE  5/26- will add Valium 5 mg QHS and monitor- will likely increase Baclofen to 15 mg TID tomorrow  5/27- increased valium to BID and increase baclofen to 15 mg TID- due to  pt's severe spasms- explained could sedate him- but pt says spasms really bad.   5/29 sx appear improved ---continue current regimen  5/31- will d/w pt going up on baclofen cannot do zanaflex due to low BP and cannot do dantrolene  due to liver issues.  6/1- Spasms/spasticity controlled per pt- don't change dosing.   17. Orthostatic hypotension  5/26- added TEDs and Abd binder during day.  5/31- decreased metoprolol  18. Ileus  5/29 -pt much improved today   -likely d/t neuro, meds, etc   -continue clear liquid diet   -continue IV reglan    -primary team can adjust bowel program tomorrow   -recheck KUB in AM for resolution   5/30- will restart regular diet- ileus resolved- also will increase senokot a little- reason got ileus is was refusing bowel program.  19. R shoulder pain  5/30- will try to do R shoulder injection tomorrow 5/31- R shoulder injections for subacromial and RTC/posterior shoulder injection today  6/1- pain better.   steroid injection was performed at R RTC and R subacromial areas using 1% plain Lidocaine and 40mg  /1cc of Kenalog. This was well tolerated.  Cleaned with betadine x3 and allowed to dry- then alcohol then injected using 27 gauge 1.5 inch needle- no bleeding or complications.    F/U in 3 months for steroid injections of R shoulder for subacromial and RTC-   Lidocaine will kick in 15 minutes- and wear off tonight- the steroid will kick in tomorrow within 24 hours and take up to 72 hours to fully kick in.     LOS: 8 days A FACE TO FACE EVALUATION WAS PERFORMED  Geniya Fulgham 07/13/2020, 8:00 AM

## 2020-07-13 NOTE — Progress Notes (Signed)
Occupational Therapy Session Note  Patient Details  Name: Darren Harrison MRN: [id number] Date of Birth: 17-Jan-1956  Today's Date: 07/13/2020 OT Individual Time: 8889-1694 OT Individual Time Calculation (min): 75 min    Short Term Goals: Week 2:  OT Short Term Goal 1 (Week 2): Pt will perform self feeding with Min A with AE PRN OT Short Term Goal 2 (Week 2): Pt will improve trunk control/sitting balance to sit EOB/EOM for 5 mins with no more than Min A OT Short Term Goal 3 (Week 2): Pt will complete sit <> stands in [place] with Max of 1  Skilled Therapeutic Interventions/Progress Updates:    Pt resting in bed upon arrival and agreeable to getting OOB. LB dressing at bed level-dependent but pt able to bridge in bed to facilitate therapist pulling pants over hips. Supine>sit EOB with HOB elevated-min A. Sitting balance EOB with min A. Sit<>stand in Stedy with mod A+2 from EOB. Pt completed UB dressing with mod A this morning while seated in w/c. Pt washed face with supervision. Discussed shower on Friday. Pt transitioned to gym and transferred to [facility]. OT intervention with focus on dynamic sitting balance tossing beach ball, lateral leans, and half crunches. Max A/mod A for sitting balance during activities. Lateral leans with CGA to R and L. Pt able to push up from forearm to sitting positoion. Half crunches with mod A from 75 degrees. Pt returned to room and remained in w/c with all needs within reach.   Therapy Documentation Precautions:  Precautions Precautions: Cervical,Fall Precaution Comments: reviewed precautions Required Braces or Orthoses: Cervical Brace Cervical Brace: Hard collar,Other (comment) (donned in sitting) Restrictions Weight Bearing Restrictions: No   Pain:  "I'm ok"; pt reports Rt shoulder feels "much better"   Therapy/Group: Individual Therapy  [name] 07/13/2020, 9:37 AM

## 2020-07-14 DIAGNOSIS — G959 Disease of spinal cord, unspecified: Secondary | ICD-10-CM | POA: Diagnosis not present

## 2020-07-14 LAB — T4, FREE: Free T4: 1.03 ng/dL (ref 0.61–1.12)

## 2020-07-14 LAB — TSH: TSH: <0.01 u[IU]/mL — ABNORMAL LOW (ref 0.350–4.500)

## 2020-07-14 NOTE — Progress Notes (Signed)
PROGRESS NOTE   Subjective/Complaints:  Pt reports the steroid injections of R shoulder have been a "tremendous help".  Slept well- had a GREAT day with therapy yesterday- really making gains.  Had BM with suppository and 2 rounds of dig stim per pt/staff note.  Said "peed" a lot yesterday- however was incontinent- which likely means it's due to overflow, not voiding.   Asking about shower Discussed trying in/out caths- pt admits he's upset about this, but willing to try.    ROS:   Pt denies SOB, abd pain, CP, N/V/C/D, and vision changes     Objective:   No results found. No results for input(s): WBC, HGB, HCT, PLT in the last 72 hours. No results for input(s): NA, K, CL, CO2, GLUCOSE, BUN, CREATININE, CALCIUM in the last 72 hours.  Intake/Output Summary (Last 24 hours) at 07/14/2020 0834 Last data filed at 07/14/2020 0735 Gross per 24 hour  Intake 597 ml  Output 850 ml  Net -253 ml     Pressure Injury 06/28/20 Thigh Left;Posterior;Proximal Stage 3 -  Full thickness tissue loss. Subcutaneous fat may be visible but bone, tendon or muscle are NOT exposed. (Active)  06/28/20 1600 (Assessed at office prior to admission)  Location: Thigh  Location Orientation: Left;Posterior;Proximal  Staging: Stage 3 -  Full thickness tissue loss. Subcutaneous fat may be visible but bone, tendon or muscle are NOT exposed.  Wound Description (Comments):   Present on Admission: Yes     Pressure Injury 07/05/20 Coccyx Mid Stage 1 -  Intact skin with non-blanchable redness of a localized area usually over a bony prominence. (Active)  07/05/20 1540  Location: Coccyx  Location Orientation: Mid  Staging: Stage 1 -  Intact skin with non-blanchable redness of a localized area usually over a bony prominence.  Wound Description (Comments):   Present on Admission: Yes    Physical Exam: Vital Signs Blood pressure 104/60, pulse 60, temperature  97.9 F (36.6 C), temperature source Oral, resp. rate 17, height 5\' 10"  (1.778 m), weight 74.8 kg, SpO2 97 %.      General: awake, alert, appropriate, finished 100% breakfast; sitting up in bed; NAD HENT: conjugate gaze; oropharynx moist CV: regular rate; no JVD Pulmonary: CTA B/L; no W/R/R- good air movement GI: soft, NT, ND, (+)BS; normoactive Psychiatric: appropriate, but can tell he's unhappy when discussed cathing; animated about shoulder improvement Neurological: Ox3; MAS of 1+ in LEs Musculoskeletal:  General: No swelling. 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 Sensation: RUE C4 ok- decreased from C5 to S1 on R LUE- C5 OK- decreased from C6 to S1 on L   Assessment/Plan: 1. Functional deficits which require 3+ hours per day of interdisciplinary therapy in a comprehensive inpatient rehab setting.  Physiatrist is providing close team supervision and 24 hour management of active medical problems listed below.  Physiatrist and rehab team continue to assess barriers to discharge/monitor patient progress toward functional and medical goals  Care Tool:  Bathing  Bathing activity did not occur: Refused Body parts bathed by patient: Chest,Abdomen,Face   Body parts bathed  by helper: Right arm,Left arm,Front perineal area,Buttocks,Right upper leg,Left upper leg,Right lower leg,Left lower leg     Bathing assist Assist Level: Total Assistance - Patient < 25%     Upper Body Dressing/Undressing Upper body dressing   What is the patient wearing?: Pull over shirt,Orthosis    Upper body assist Assist Level: Maximal Assistance - Patient 25 - 49%    Lower Body Dressing/Undressing Lower body dressing      What is the patient wearing?: Pants     Lower body assist Assist for lower body dressing: Maximal Assistance - Patient 25 - 49%     Toileting Toileting    Toileting assist Assist for toileting: Dependent - Patient 0%     Transfers Chair/bed transfer  Transfers assist     Chair/bed transfer assist level: Dependent - mechanical lift (stedy)     Locomotion Ambulation   Ambulation assist   Ambulation activity did not occur: Safety/medical concerns          Walk 10 feet activity   Assist  Walk 10 feet activity did not occur: Safety/medical concerns        Walk 50 feet activity   Assist Walk 50 feet with 2 turns activity did not occur: Safety/medical concerns         Walk 150 feet activity   Assist Walk 150 feet activity did not occur: Safety/medical concerns         Walk 10 feet on uneven surface  activity   Assist Walk 10 feet on uneven surfaces activity did not occur: Safety/medical concerns         Wheelchair     Assist Will patient use wheelchair at discharge?: Yes Type of Wheelchair:  (TBD)    Wheelchair assist level: Dependent - Patient 0% Max wheelchair distance: 150'    Wheelchair 50 feet with 2 turns activity    Assist        Assist Level: Dependent - Patient 0%   Wheelchair 150 feet activity     Assist      Assist Level: Dependent - Patient 0%   Blood pressure 104/60, pulse 60, temperature 97.9 F (36.6 C), temperature source Oral, resp. rate 17, height 5\' 10"  (1.778  m), weight 74.8 kg, SpO2 97 %.  Medical Problem List and Plan: 1.C4 Tetraplegia -ASIA Csecondary tocervical myelopathy/cervical synovial cyst. Status post C3-4 laminectomy with posterior arthrodesis cervical instrumentation 06/29/2020. Cervical brace as directed. -patient may Shower with neck brace/incision covered -ELOS/Goals: ~3 weeks- min A -will order PRAFOs and have pt wear nightly to prevent ankle contractures  5/26- con't PT and OT- spent 20 minutes going over ASIA level, and info about SCI including neurogenic bowel and bladder, spasticity and increased risk of DVT/pressure ulcers.   -con't PT and OT- d/w pt and OT to see if can get shower  tomorrow- cover incision only for that.     2. Antithrombotics: -DVT/anticoagulation:Lovenox. Check vascular study 5/26- will need for at least 2 months- maybe 3 months total from surgery.  3. Pain Management:Baclofen 5 mg 3 times daily, oxycodone and Flexeril as needed  5/26- pain pretty well controlled- con't regimen  5/28-   con't regimen prn  5/31- will do R shoulder injection today.  6/1- pain doing somewhat better this AM- don't push too hard in first 3 days- told OT.  6/2- pain in R shoulder MUCH better- con't to monitor/con't meds  4. Mood:Provide emotional support  5/26- pt has been tearful understandably- will try Celexa 10 mg daily  5/31- tolerating, will increase to 20 mg daily -antipsychotic agents: N/A 5. Neuropsych: This patientiscapable of making decisions on hisown behalf. 6. Skin/Wound Care:Routine skin checks 7. Fluids/Electrolytes/Nutrition:Routine in and outs with follow-up chemistries 8. Atrial fibrillation. Lopressor 25 mg twice daily. Cardiac rate controlled  5/28 overall controlled- con't regimen  5/31- will decrease Metoprolol to 12.5 mg daily (was qday) since heart rate on low side and BP low- don't want him orthostatic due to SCI.   6/1- HR 54 this AM- they are  holding Metoprolol- will con't to monitor  9. Hypertension with orthostatic hypotension. Cozaar 50 mg daily. Monitor with increased mobility  5/28 elevated today.  5/30- BP 90s/50s- will monitor for orthostatic hypotension  5/31- will decrease metoprolol as above  6/1- HR still low and BP is 90s/60s- con't regimen for now. Might need midodrine per PT, drops with therapy, but not dizzy.  10. BPH/UTI.Was onFlomax 0.4 mg daily.UTI greater than 80,000 Staphylococcus/Enterococcus completing course of Bactrim- please see neurogenic bladder- 11. Surgical PCR screening positive. Bactroban as directed 12. Hyperthyroidism- had lost 70 lbs in 6 months- in middle of treatment  6/1- per Endo, want labs checked before appointment 6/7- will order thyroid labs for tomorrow, so they are back for appointment.   6/2- TSH <0.010 and free T4 1.03- free T3 pending.  13. Fatty liver-will need to be careful with any medicine going through liver.  14. Neurogenic bladder- will start on I/o caths q6 hours prn and increase Flomax to 0.8 mg with supper  5/25 no side effects with flomax- con't regimen  5/26- went over with pt, will need in/out caths or Foley until he's able to emtpy- I cannot guarantee he will get complete function back.   5/30- voided a little yesterday- con't Flomax and in/out caths  5/31- sounds like overflow, not real voiding- will verify with nursing they are cathing q6 hours. No prn- because he's having overflow incontinence.  6/1- pt will start trying to learn in/out cathing as of tomorrow- order placed   6/2- d/w nursing and OT, needs to learn how to cath. Can try different catheters? 15. Neurogenic bowel- doesn't have good control- will start with suppository- and dig stim and see if can withdraw any of them over time.   6/2- Pt having good results with bowel program- still requiring some dig stim, but less.  16. Spasticity- will increase Baclofen to 10 mg TID and con't  Flexeril.  D/c'd flexeril  5/31- will d/w pt going up on baclofen cannot do zanaflex due to low BP and cannot do dantrolene due to liver issues.  6/1- Spasms/spasticity controlled per pt- don't change dosing.   17. Orthostatic hypotension  5/26- added TEDs and Abd binder during day.  5/31- decreased metoprolol  18. Ileus  5/29 -pt much improved today   -likely  d/t neuro, meds, etc   -continue clear liquid diet   -continue IV reglan    -primary team can adjust bowel program tomorrow   -recheck KUB in AM for resolution   5/30- will restart regular diet- ileus resolved- also will increase senokot a little- reason got ileus is was refusing bowel program.  19. R shoulder pain  5/30- will try to do R shoulder injection tomorrow 5/31- R shoulder injections for subacromial and RTC/posterior shoulder injection today  6/2- pain MUCH better- con't regimen  5/31 steroid injection was performed at R RTC and R subacromial areas using 1% plain Lidocaine and 40mg  /1cc of Kenalog. This was well tolerated.  Cleaned with betadine x3 and allowed to dry- then alcohol then injected using 27 gauge 1.5 inch needle- no bleeding or complications.    F/U in 3 months for steroid injections of R shoulder for subacromial and RTC-   Lidocaine will kick in 15 minutes- and wear off tonight- the steroid will kick in tomorrow within 24 hours and take up to 72 hours to fully kick in.     LOS: 9 days A FACE TO FACE EVALUATION WAS PERFORMED  Daviyon Widmayer 07/14/2020, 8:34 AM

## 2020-07-14 NOTE — Progress Notes (Signed)
Bowel program completed. Bowel program was started on day-shift and finished on night-shift. Patient was digital stimulated two times and have a large soft bowel movement. Patient tolerated well.

## 2020-07-14 NOTE — Progress Notes (Signed)
Occupational Therapy Session Note  Patient Details  Name: Darren Harrison MRN: [id number] Date of Birth: April 29, 1955  Today's Date: 07/14/2020 OT Individual Time: 8329-1916 OT Individual Time Calculation (min): 70 min    Short Term Goals: Week 1:  OT Short Term Goal 1 (Week 1): Pt will perform self feeding with Min A with AE PRN OT Short Term Goal 2 (Week 1): Pt will improve trunk control/sitting balance to sit EOB/EOM for 5 mins with no more than Min A OT Short Term Goal 3 (Week 1): Pt will complete sit <> stands in [place] with Max of 1 Week 2:  OT Short Term Goal 1 (Week 2): Pt will perform self feeding with Min A with AE PRN OT Short Term Goal 2 (Week 2): Pt will improve trunk control/sitting balance to sit EOB/EOM for 5 mins with no more than Min A OT Short Term Goal 3 (Week 2): Pt will complete sit <> stands in [place] with Max of 1   Skilled Therapeutic Interventions/Progress Updates:    Pt greeted at time of session supine in bed resting agreeable to OT session, just finished morning round with MD and discussion regarding cathing, if he could be able to do himself given [facility] limitations. Donned shorts bed level with Max/total A with pt rolling L and R with Min A for rolling. Supine > sit CGA with extended time, sit <> stand in [place] Mod/Max of 1 from bed height, Min A from perch, stedy > TIS w/c. Collar donned as well dependent for time, TEDS same manner for time. No dizziness throughout session. Doffed/doffed new shirt with Mod A overall. Transported to gym and set up on SICIFT level 1.5 for 6 minutes total with one rest break at half way point. Towel slides 2x10 for shoulder flexion with passive stretch at end range, 1x10 towel twists for wrist flex/ext as in ringing out a towel, grip strength measured as well with averages: 38# for left and 20# right. Transported back to room and set up with alarm on call bell in reach.    Therapy Documentation Precautions:  Precautions Precautions:  Cervical,Fall Precaution Comments: reviewed precautions Required Braces or Orthoses: Cervical Brace Cervical Brace: Hard collar,Other (comment) (donned in sitting) Restrictions Weight Bearing Restrictions: No    Therapy/Group: Individual Therapy  Viona Gilmore 07/14/2020, 7:07 AM
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
## 2020-07-14 NOTE — Progress Notes (Signed)
Physical Therapy Session Note  Patient Details  Name: Darren Harrison MRN: [id number] Date of Birth: 05/09/1955  Today's Date: 07/14/2020 PT Individual Time: 1000-1115; 1400-1500 PT Individual Time Calculation (min): 75 min and 60 min  Short Term Goals: Week 2:  PT Short Term Goal 1 (Week 2): Pt will perform bed mobility with mod A consistently PT Short Term Goal 2 (Week 2): Pt will perform least restrictive transfer with assist x 1 PT Short Term Goal 3 (Week 2): Pt will maintain standing with LRAD x 5 min PT Short Term Goal 4 (Week 2): Pt will maintain sitting balance x 5 min with min A  Skilled Therapeutic Interventions/Progress Updates:    Session 1: Pt received handed off from previous PT session in dayroom set up with LiteGait for gait training. Ambulation 2 x 100 ft with use of LiteGait with assist x 1 to steer device and assist x 1-2 for lateral weight shift onto RLE and assisting with placement of LE due to some ataxia and hip abductor weakness, narrow BOS and scissoring of gait pattern. Pt does require significant off-weighting with use of LiteGait in order to perform gait at this time, has onset of B knee buckling that increases with fatigue. Stedy transfer w/c to/from mat table. Seated balance EOM with min A while performing ball toss with recreational therapist, 3 x 15 reps. Pt returned to w/c at end of session, left semi-reclined in chair in room with needs in reach at end of session. Cotreatment session with LRT.  Session 2: Pt received seated in TIS chair in room, agreeable to PT session. No complaints of pain. Dependent transport via TIS to/from therapy gym. Sit to stand with stedy with min A x 2 at the least during session, max A x 2 at the most. Stedy transfer w/c to/from mat table. Session focus on sitting balance EOM, core strengthening, and UE strengthening. Seated balance with min A at most with intermittent support from therapy ball behind patient. Seated UE strengthening  therex: 4# dowel rod bicep curls 2 x 15 reps, 2# chest press x 10 reps. Seated L/R lateral leans 2 x 5 reps each direction. Pt initially requires only CGA to return to midline, increases to min A with onset of fatigue. Seated alt UE reaching for targets with min A for balance, focus on reaching outside BOS and across midline. Seated anterior leans with focus on maintaining trunk control and achieving upright posture, 2 x 15 reps. Pt requests to return to bed at end of session. stedy transfer w/c to bed. Sit to supine mod A for BLE management. Pt left semi-reclined in bed with needs in reach, bed alarm in place at end of session.  Therapy Documentation Precautions:  Precautions Precautions: Cervical,Fall Precaution Comments: reviewed precautions Required Braces or Orthoses: Cervical Brace Cervical Brace: Hard collar,Other (comment) (donned in sitting) Restrictions Weight Bearing Restrictions: No    Therapy/Group: Individual Therapy   [name], PT, DPT, CSRS  07/14/2020, 12:20 PM

## 2020-07-14 NOTE — Progress Notes (Signed)
Physical Therapy Session Note  Patient Details  Name: Darren Harrison MRN: [id number] Date of Birth: Feb 22, 1955  Today's Date: 07/14/2020 PT Individual Time: 0905-1000 PT Individual Time Calculation (min): 55 min   Short Term Goals: Week 1:  PT Short Term Goal 1 (Week 1): Pt will complete bed mobility with assist x 1 consistently PT Short Term Goal 1 - Progress (Week 1): Met PT Short Term Goal 2 (Week 1): Pt will initiate slide board transfers PT Short Term Goal 2 - Progress (Week 1): Met PT Short Term Goal 3 (Week 1): Pt will maintain sitting balance x 5 min with min A PT Short Term Goal 3 - Progress (Week 1): Progressing toward goal Week 2:  PT Short Term Goal 1 (Week 2): Pt will perform bed mobility with mod A consistently PT Short Term Goal 2 (Week 2): Pt will perform least restrictive transfer with assist x 1 PT Short Term Goal 3 (Week 2): Pt will maintain standing with LRAD x 5 min PT Short Term Goal 4 (Week 2): Pt will maintain sitting balance x 5 min with min A Week 3:     Skilled Therapeutic Interventions/Progress Updates:    Pain:  Pt reports no pain.  Treatment to tolerance.  Rest breaks and repositioning as needed.  Pt initially oob in wc and agreeable to treatment session w/focus on initiation of gait training.  Pt transported to gym.  Educated on benefits and process of using Litegait.   Sit to stand using Steady w/min assist from wc, cues for upright posture, min assist for posture. Lite Gait harness donned w/mult standing efforts in Steady. Sit to stand from wc to Lite gait w/mod assist. Gait in LiteGait harness as follows:  First trial: Lite bodyweight support x 79f w/mod assist for wt shifting, max assist to advance RLE but pt initiates movement, scissoring gait, pt advances RLE without assist, slides thru swing, scissors,  mod assist for upright core, second person assist to manage Litegait.  Pt is able to grasp handsupports without assist.  Second  Trial: Increased bodywt support to pt comfort approx 10-20%, gait 230finitially therapist providing mod assist for wtshifting, min assist to advance LLE but fades to min assist for wt shifting and pt advances bilat LEs, scissoring gait, short step length bilat L shorter than R, maintains flexed posture at knees but may be partially due to arthritis.  Pt handed off to TaExcell SeltzerPT for next session.   Therapy Documentation Precautions:  Precautions Precautions: Cervical,Fall Precaution Comments: reviewed precautions Required Braces or Orthoses: Cervical Brace Cervical Brace: Hard collar,Other (comment) (donned in sitting) Restrictions Weight Bearing Restrictions: No    Therapy/Group: Individual Therapy  BaCallie FieldingPTFordland/03/2020, 12:42 PM

## 2020-07-15 LAB — T3, FREE: T3, Free: 1.7 pg/mL — ABNORMAL LOW (ref 2.0–4.4)

## 2020-07-15 NOTE — Progress Notes (Signed)
Occupational Therapy Session Note  Patient Details  Name: Darren Harrison MRN: [id number] Date of Birth: [birth year]  Today's Date: 07/15/2020 OT Individual Time: 1133-1200 OT Individual Time Calculation (min): 27 min    Short Term Goals: Week 2:  OT Short Term Goal 1 (Week 2): Pt will perform self feeding with Min A with AE PRN OT Short Term Goal 2 (Week 2): Pt will improve trunk control/sitting balance to sit EOB/EOM for 5 mins with no more than Min A OT Short Term Goal 3 (Week 2): Pt will complete sit <> stands in [place] with Max of 1  Skilled Therapeutic Interventions/Progress Updates:    Treatment session with focus on BUE ROM and endurance.  Pt received upright in tilt in space w/c agreeable to therapy session.  Engaged in [place] with hula hoop with focus on symmetrical shoulder flexion and internal/external rotation.  Pt required facilitation at RUE due to decreased AROM. Engaged in towel glides on wedge with increasing angle to facilitate increased shoulder flexion.  Completed bilaterally with pt reporting increased difficulty on R side, but reports understanding of need to focus on ROM and strength.  Pt remained upright in tilt in space w/c with all needs in reach.  Therapy Documentation Precautions:  Precautions Precautions: Cervical,Fall Precaution Comments: reviewed precautions Required Braces or Orthoses: Cervical Brace Cervical Brace: Hard collar,Other (comment) (donned in sitting) Restrictions Weight Bearing Restrictions: No Pain:  Pt with no c/o pain   Therapy/Group: Individual Therapy  Simonne Come 07/15/2020, 12:17 PM

## 2020-07-15 NOTE — Progress Notes (Signed)
Bowel program started at 1845 with dig stim and suppository. Pt assisted to commode at 1915. Reported off to oncoming staff.   Education initiated with pt and spouse on I/O cath and discussed bowel program. Spouse demonstrated I/O cath using clean technique. She did well and will benefit from further practice. Will continue discussion on bowel program. Pt tolerated well.   Gerald Stabs, RN

## 2020-07-15 NOTE — Progress Notes (Signed)
Physical Therapy Session Note  Patient Details  Name: Darren Harrison MRN: [id number] Date of Birth: 02-27-1955  Today's Date: 07/15/2020 PT Individual Time: 1417-1530  PT Individual Time Calculation: 73 min    Short Term Goals: Week 1:  PT Short Term Goal 1 (Week 1): Pt will complete bed mobility with assist x 1 consistently PT Short Term Goal 1 - Progress (Week 1): Met PT Short Term Goal 2 (Week 1): Pt will initiate slide board transfers PT Short Term Goal 2 - Progress (Week 1): Met PT Short Term Goal 3 (Week 1): Pt will maintain sitting balance x 5 min with min A PT Short Term Goal 3 - Progress (Week 1): Progressing toward goal Week 2:  PT Short Term Goal 1 (Week 2): Pt will perform bed mobility with mod A consistently PT Short Term Goal 2 (Week 2): Pt will perform least restrictive transfer with assist x 1 PT Short Term Goal 3 (Week 2): Pt will maintain standing with LRAD x 5 min PT Short Term Goal 4 (Week 2): Pt will maintain sitting balance x 5 min with min A  Skilled Therapeutic Interventions/Progress Updates:  Patient supine in bed on entrance to room with wife present. Patient alert and agreeable to PT session. Patient with no pain complaint throughout session. Pt states desire to ambulate in LiteGait this session. Neck brace donned in longsitting prior to transition to sit EOB.  Therapeutic Activity: Bed Mobility: Patient performed supine --> sit with ability to roll to L side with Min A. Brings BLE off bed and requires max cues for hand placement and push to bed surface with Min A to bring UB to upright seated position to upright seated position. VC/ tc required throughout for technique.  Transfers: Patient performed STS throughout session using STEDY. Initally stands with Min A with good technique to transfer to TIS w/c. STEDY used in gym for donning of LiteGait harness. Provided verbal cues for technique/ effort. Pt fatigued at end of session and requires Mod A with use of STEDY  to transfer w/c to EOB.   Gait Training:  Patient ambulated >300 feet using Lite Gait with several resting breaks in modified seated position. +2 to manage LiteGait. During gait, pt demos scissoring gait, decreased step height. Requires assist for forward weight shift, intermittent need to widen BOS, and clear R foot during swing through, but he is able to advance both LE without assist. Provided vc/ tc throughout for above noted impairments.  Neuromuscular Re-ed: NMR facilitated during session with focus on motor control and muscle facilitation. Pt guided in STS using footrail of bed from TIS w/c. VC/ tc throughout for technique and Min/ Mod A increasing to heavy Mod A to complete d/t fatigue. Time in standing with Mod A for support and balance with block to R knee with L knee "giving" d/t pain from long term knee pain. Mod A for descent to sit to w/c. Repositioning of feet for improved posture and 2nd rise to stand performed with Mod A and good technique from pt. VC/ tc for slow descent to sit and pt with limited eccentric control. NMR performed for improvements in motor control and coordination, balance, sequencing, judgement, and self confidence/ efficacy in performing all aspects of mobility at highest level of independence.   Patient supine in bed at end of session with brakes locked, bed alarm set, and all needs within reach.   Therapy Documentation Precautions:  Precautions Precautions: Cervical,Fall Precaution Comments: reviewed precautions Required Braces or  Orthoses: Cervical Brace Cervical Brace: Hard collar,Other (comment) (donned in sitting) Restrictions Weight Bearing Restrictions: No  Therapy/Group: Individual Therapy  Alger Simons PT, DPT 07/15/2020, 1:05 PM

## 2020-07-15 NOTE — Progress Notes (Signed)
Occupational Therapy Session Note  Patient Details  Name: Darren Harrison MRN: [id number] Date of Birth: 07/25/1955  Today's Date: 07/15/2020 OT Individual Time: 1007-1219 OT Individual Time Calculation (min): 85 min    Short Term Goals: Week 2:  OT Short Term Goal 1 (Week 2): Pt will perform self feeding with Min A with AE PRN OT Short Term Goal 2 (Week 2): Pt will improve trunk control/sitting balance to sit EOB/EOM for 5 mins with no more than Min A OT Short Term Goal 3 (Week 2): Pt will complete sit <> stands in [place] with Max of 1  Skilled Therapeutic Interventions/Progress Updates:    OT intervention with focus on bed mobility, sit<>stand in [place], bathing at shower level, and dressing at bed level and seated in w/c. Supine<>sit EOB with min A. Sit<>stand from EOB in [place] with mod A+2. Pt bathed in rolling shower chair. Pt incontinent of bowel X 2 during shower. RN notified. Pt required mod A for bathing at shower level. Pt required assistance threading BLE into pants but able to bridge sufficiently in bed to facilitate therapist pulling pants over hips. Pt transferred back to w/c and donned pullover shirt with mod A. Pt remained in w/c with all needs within reach.   Therapy Documentation Precautions:  Precautions Precautions: Cervical,Fall Precaution Comments: reviewed precautions Required Braces or Orthoses: Cervical Brace Cervical Brace: Hard collar,Other (comment) (donned in sitting) Restrictions Weight Bearing Restrictions: No   Therapy/Group: Individual Therapy  [name] 07/15/2020, 9:35 AM

## 2020-07-15 NOTE — Progress Notes (Signed)
PROGRESS NOTE   Subjective/Complaints:  Pt reports his buttocks are sore- fell asleep on Bedpan last night-  Ring of pink per nursing.  Slept "great".  Great day with therapy- used lite gait for at least 50 ft.  2 person Assist.  Asking if "cyst" had biopsy done. Explained it's a cyst- which I've never known to be malignant- Dr Arnoldo Morale also told his wife this- no pathology was done.    ROS:   Pt denies SOB, abd pain, CP, N/V/C/D, and vision changes      Objective:   No results found. No results for input(s): WBC, HGB, HCT, PLT in the last 72 hours. No results for input(s): NA, K, CL, CO2, GLUCOSE, BUN, CREATININE, CALCIUM in the last 72 hours.  Intake/Output Summary (Last 24 hours) at 07/15/2020 0824 Last data filed at 07/15/2020 0718 Gross per 24 hour  Intake 480 ml  Output 1610 ml  Net -1130 ml     Pressure Injury 06/28/20 Thigh Left;Posterior;Proximal Stage 3 -  Full thickness tissue loss. Subcutaneous fat may be visible but bone, tendon or muscle are NOT exposed. (Active)  06/28/20 1600 (Assessed at office prior to admission)  Location: Thigh  Location Orientation: Left;Posterior;Proximal  Staging: Stage 3 -  Full thickness tissue loss. Subcutaneous fat may be visible but bone, tendon or muscle are NOT exposed.  Wound Description (Comments):   Present on Admission: Yes     Pressure Injury 07/05/20 Coccyx Mid Stage 1 -  Intact skin with non-blanchable redness of a localized area usually over a bony prominence. (Active)  07/05/20 1540  Location: Coccyx  Location Orientation: Mid  Staging: Stage 1 -  Intact skin with non-blanchable redness of a localized area usually over a bony prominence.  Wound Description (Comments):   Present on Admission: Yes    Physical Exam: Vital Signs Blood pressure 98/66, pulse 72, temperature 97.7 F (36.5 C), temperature source Oral, resp. rate 17, height 5\' 10"  (1.778 m), weight  74.8 kg, SpO2 98 %.     General: awake, alert, appropriate, sitting up in bed; NAD HENT: conjugate gaze; oropharynx moist CV: regular rate; no JVD Pulmonary: CTA B/L; no W/R/R- good air movement GI: soft, NT, ND, (+)BS Psychiatric: appropriate- but a little flat- bette than the last week.  Neurological: alert Musculoskeletal:  General: No swelling. 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 Sensation: RUE C4 ok- decreased from C5 to S1 on R LUE- C5 OK- decreased from C6 to S1 on L   Assessment/Plan: 1. Functional deficits which require 3+ hours per day of interdisciplinary therapy in a comprehensive inpatient rehab setting.  Physiatrist is providing close team supervision and 24 hour management of active medical problems listed below.  Physiatrist and rehab team continue to assess barriers to discharge/monitor patient progress toward functional and medical goals  Care Tool:  Bathing  Bathing activity did not occur: Refused Body parts bathed by patient: Chest,Abdomen,Face   Body parts bathed by helper: Right arm,Left arm,Front perineal area,Buttocks,Right upper leg,Left upper leg,Right lower leg,Left  lower leg     Bathing assist Assist Level: Total Assistance - Patient < 25%     Upper Body Dressing/Undressing Upper body dressing   What is the patient wearing?: Pull over shirt    Upper body assist Assist Level: Moderate Assistance - Patient 50 - 74%    Lower Body Dressing/Undressing Lower body dressing      What is the patient wearing?: Pants     Lower body assist Assist for lower body dressing: Maximal Assistance - Patient 25 - 49%     Toileting Toileting    Toileting assist Assist for toileting: Dependent - Patient 0%     Transfers Chair/bed transfer  Transfers assist     Chair/bed transfer assist level: Dependent - mechanical lift     Locomotion Ambulation   Ambulation assist   Ambulation activity did not occur: Safety/medical concerns  Assist level: Dependent - Patient 0% Assistive device: Lite Gait Max distance: 100'   Walk 10 feet activity   Assist  Walk 10 feet activity did not occur: Safety/medical concerns  Assist level: Dependent - Patient 0% Assistive device: Lite Gait   Walk 50 feet activity   Assist Walk 50 feet with 2 turns activity did not occur: Safety/medical concerns  Assist level: Dependent - Patient 0% Assistive device: Lite Gait    Walk 150 feet activity   Assist Walk 150 feet activity did not occur: Safety/medical concerns         Walk 10 feet on uneven surface  activity   Assist Walk 10 feet on uneven surfaces activity did not occur: Safety/medical concerns         Wheelchair     Assist Will patient use wheelchair at discharge?: Yes Type of Wheelchair:  (TBD)    Wheelchair assist level: Dependent - Patient 0% Max wheelchair distance: 150'    Wheelchair 50 feet with 2 turns activity    Assist        Assist Level: Dependent - Patient 0%   Wheelchair 150 feet activity     Assist      Assist Level: Dependent - Patient 0%   Blood pressure 98/66, pulse 72, temperature 97.7 F  (36.5 C), temperature source Oral, resp. rate 17, height 5\' 10"  (1.778 m), weight 74.8 kg, SpO2 98 %.  Medical Problem List and Plan: 1.C4 Tetraplegia -ASIA Csecondary tocervical myelopathy/cervical synovial cyst. Status post C3-4 laminectomy with posterior arthrodesis cervical instrumentation 06/29/2020. Cervical brace as directed. -patient may Shower with neck brace/incision covered -ELOS/Goals: ~3 weeks- min A -will order PRAFOs and have pt wear nightly to prevent ankle contractures  5/26- con't PT and OT- spent 20 minutes going over ASIA level, and info about SCI including neurogenic bowel and bladder, spasticity and increased risk of DVT/pressure ulcers.   --con't PT  and OT_ shower today- cover incision    2. Antithrombotics: -DVT/anticoagulation:Lovenox. Check vascular study 5/26- will need for at least 2 months- maybe 3 months total from surgery.  6/3- will need for a total of 3 months  3. Pain Management:Baclofen 5 mg 3 times daily, oxycodone and Flexeril as needed  5/26- pain pretty well controlled- con't regimen  5/28-   con't regimen prn  5/31- will do R shoulder injection today.  6/3- Shoulder injection 5/31- R shoulder pain almost gone with injection  4. Mood:Provide emotional support  5/26- pt has been tearful understandably- will try Celexa 10 mg daily  5/31- tolerating, will increase to 20 mg daily  6/3- doing better- brighter affect -antipsychotic agents: N/A 5. Neuropsych: This patientiscapable of making decisions on hisown behalf. 6. Skin/Wound Care:Routine skin checks 7. Fluids/Electrolytes/Nutrition:Routine in and outs with follow-up chemistries 8. Atrial fibrillation. Lopressor 25 mg twice daily. Cardiac rate controlled  5/28 overall controlled- con't regimen  5/31- will decrease Metoprolol to 12.5 mg daily (was qday) since heart rate on low side and BP low- don't want him orthostatic due to SCI.   6/1- HR 54  this AM- they are holding Metoprolol- will con't to monitor  6/2- HR 71 this AM- con't regimen  9. Hypertension with orthostatic hypotension. Cozaar 50 mg daily. Monitor with increased mobility  5/28 elevated today.  5/30- BP 90s/50s- will monitor for orthostatic hypotension  5/31- will decrease metoprolol as above  6/1- HR still low and BP is 90s/60s- con't regimen for now. Might need midodrine per PT, drops with therapy, but not dizzy.  10. BPH/UTI.Was onFlomax 0.4 mg daily.UTI greater than 80,000 Staphylococcus/Enterococcus completing course of Bactrim- please see neurogenic bladder- 11. Surgical PCR screening positive. Bactroban as directed 12. Hyperthyroidism- had lost 70 lbs in 6 months- in middle of treatment  6/1- per Endo, want labs checked before appointment 6/7- will order thyroid labs for tomorrow, so they are back for appointment.   6/2- TSH <0.010 and free T4 1.03- free T3 pending.   6/3- pt to have endo phone appt 6/9- asked him to let therapy know to schedule therapy around it.  13. Fatty liver-will need to be careful with any medicine going through liver.  14. Neurogenic bladder- will start on I/o caths q6 hours prn and increase Flomax to 0.8 mg with supper  5/25 no side effects with flomax- con't regimen  5/26- went over with pt, will need in/out caths or Foley until he's able to emtpy- I cannot guarantee he will get complete function back.   5/30- voided a little yesterday- con't Flomax and in/out caths  5/31- sounds like overflow, not real voiding- will verify with nursing they are cathing q6 hours. No prn- because he's having overflow incontinence.  6/1- pt will start trying to learn in/out cathing as of tomorrow- order placed   6/2- d/w nursing and OT, needs to learn how to cath. Can try different catheters? 15. Neurogenic bowel- doesn't have good control- will start with suppository- and dig stim and see if can withdraw any of them over time.   6/2- Pt  having good results with bowel program- still requiring some dig stim, but less.  16. Spasticity- will increase Baclofen to 10 mg TID and con't Flexeril.  D/c'd flexeril  5/31- will d/w pt going up on baclofen cannot do zanaflex due to low BP and cannot do dantrolene due to liver issues.  6/1- Spasms/spasticity controlled per pt- don't change dosing.   17. Orthostatic  hypotension  5/26- added TEDs and Abd binder during day.  5/31- decreased metoprolol  18. Ileus  5/29 -pt much improved today   -likely d/t neuro, meds, etc   -continue clear liquid diet   -continue IV reglan    -primary team can adjust bowel program tomorrow   -recheck KUB in AM for resolution   5/30- will restart regular diet- ileus resolved- also will increase senokot a little- reason got ileus is was refusing bowel program.  19. R shoulder pain  5/30- will try to do R shoulder injection tomorrow 5/31- R shoulder injections for subacromial and RTC/posterior shoulder injection today  6/2- pain MUCH better- con't regimen  5/31 steroid injection was performed at R RTC and R subacromial areas using 1% plain Lidocaine and 40mg  /1cc of Kenalog. This was well tolerated.  Cleaned with betadine x3 and allowed to dry- then alcohol then injected using 27 gauge 1.5 inch needle- no bleeding or complications.    F/U in 3 months for steroid injections of R shoulder for subacromial and RTC-   Lidocaine will kick in 15 minutes- and wear off tonight- the steroid will kick in tomorrow within 24 hours and take up to 72 hours to fully kick in.     LOS: 10 days A FACE TO FACE EVALUATION WAS PERFORMED  Darren Harrison 07/15/2020, 8:24 AM

## 2020-07-16 MED ORDER — DIAZEPAM 5 MG PO TABS
5.0000 mg | ORAL_TABLET | Freq: Two times a day (BID) | ORAL | Status: DC | PRN
  Filled 2020-07-16: qty 1

## 2020-07-16 MED ORDER — METOCLOPRAMIDE HCL 5 MG PO TABS: 5.0000 mg | ORAL_TABLET | Freq: Three times a day (TID) | ORAL | Status: DC | PRN

## 2020-07-16 NOTE — Progress Notes (Signed)
Bowel program completed. Dig stim x2. First Dig stim was completed on dayshift. Patient tolerated well.

## 2020-07-16 NOTE — Plan of Care (Signed)
  Problem: Consults Goal: RH SPINAL CORD INJURY PATIENT EDUCATION Description:  See Patient Education module for education specifics.  Outcome: Progressing   Problem: SCI BOWEL ELIMINATION Goal: RH STG MANAGE BOWEL WITH ASSISTANCE Description: STG Manage Bowel with  min Assistance. Outcome: Progressing Goal: RH STG SCI MANAGE BOWEL PROGRAM W/ASSIST OR AS APPROPRIATE Description: STG SCI Manage bowel program w/ min assist or as appropriate. Outcome: Progressing   Problem: SCI BLADDER ELIMINATION Goal: RH STG MANAGE BLADDER WITH ASSISTANCE Description: STG Manage Bladder With  min Assistance Outcome: Progressing Goal: RH STG MANAGE BLADDER WITH MEDICATION WITH ASSISTANCE Description: STG Manage Bladder With Medication With  mod I Assistance. Outcome: Progressing   Problem: RH SKIN INTEGRITY Goal: RH STG SKIN FREE OF INFECTION/BREAKDOWN Description: No new skin issues and healing chronic issues with min assist Outcome: Progressing Goal: RH STG MAINTAIN SKIN INTEGRITY WITH ASSISTANCE Description: STG Maintain Skin Integrity With  min Assistance. Outcome: Progressing Goal: RH STG ABLE TO PERFORM INCISION/WOUND CARE W/ASSISTANCE Description: STG Able To Perform Incision/Wound Care With  min Assistance. Outcome: Progressing   Problem: RH SAFETY Goal: RH STG ADHERE TO SAFETY PRECAUTIONS W/ASSISTANCE/DEVICE Description: STG Adhere to Safety Precautions With cues/reminders Assistance/Device. Outcome: Progressing   Problem: RH PAIN MANAGEMENT Goal: RH STG PAIN MANAGED AT OR BELOW PT'S PAIN GOAL Description: At or below level 4 Outcome: Progressing   Problem: RH KNOWLEDGE DEFICIT SCI Goal: RH STG INCREASE KNOWLEDGE OF SELF CARE AFTER SCI Description: Patient will be able to direct care and wife will be able to manage care at discharge using equipment handouts and educational materials independently Outcome: Progressing

## 2020-07-16 NOTE — Progress Notes (Signed)
PROGRESS NOTE   Subjective/Complaints: Feels he no longer needs his nausea medications. Discussed changing them to PRN and he is agreeable. Asks that his protein shake be given at night as his hepatologist requested   ROS:   Pt denies SOB, abd pain, CP, N/V/C/D, and vision changes      Objective:   No results found. No results for input(s): WBC, HGB, HCT, PLT in the last 72 hours. No results for input(s): NA, K, CL, CO2, GLUCOSE, BUN, CREATININE, CALCIUM in the last 72 hours.  Intake/Output Summary (Last 24 hours) at 07/16/2020 1047 Last data filed at 07/16/2020 0807 Gross per 24 hour  Intake 840 ml  Output 1575 ml  Net -735 ml     Pressure Injury 06/28/20 Thigh Left;Posterior;Proximal Stage 3 -  Full thickness tissue loss. Subcutaneous fat may be visible but bone, tendon or muscle are NOT exposed. (Active)  06/28/20 1600 (Assessed at office prior to admission)  Location: Thigh  Location Orientation: Left;Posterior;Proximal  Staging: Stage 3 -  Full thickness tissue loss. Subcutaneous fat may be visible but bone, tendon or muscle are NOT exposed.  Wound Description (Comments):   Present on Admission: Yes     Pressure Injury 07/05/20 Coccyx Mid Stage 1 -  Intact skin with non-blanchable redness of a localized area usually over a bony prominence. (Active)  07/05/20 1540  Location: Coccyx  Location Orientation: Mid  Staging: Stage 1 -  Intact skin with non-blanchable redness of a localized area usually over a bony prominence.  Wound Description (Comments):   Present on Admission: Yes    Physical Exam: Vital Signs Blood pressure 103/68, pulse 64, temperature 98.4 F (36.9 C), resp. rate 20, height 5\' 10"  (1.778 m), weight 74.8 kg, SpO2 98 %. Gen: no distress, normal appearing HEENT: oral mucosa pink and moist, NCAT Cardio: Reg rate Chest: normal effort, normal rate of breathing Abd: soft, non-distended Ext: no  edema Psych: pleasant, normal affect Musculoskeletal:  General: No swelling. 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 Sensation: RUE C4 ok- decreased from C5 to S1 on R LUE- C5 OK- decreased from C6 to S1 on L   Assessment/Plan: 1. Functional deficits which require 3+ hours per day of interdisciplinary therapy in a comprehensive inpatient rehab setting.  Physiatrist is providing close team supervision and 24 hour management  of active medical problems listed below.  Physiatrist and rehab team continue to assess barriers to discharge/monitor patient progress toward functional and medical goals  Care Tool:  Bathing  Bathing activity did not occur: Refused Body parts bathed by patient: Right arm,Left arm,Chest,Abdomen,Front perineal area,Right upper leg,Left upper leg,Face   Body parts bathed by helper: Buttocks,Right lower leg,Left lower leg     Bathing assist Assist Level: Moderate Assistance - Patient 50 - 74%     Upper Body Dressing/Undressing Upper body dressing   What is the patient wearing?: Pull over shirt    Upper body assist Assist Level: Moderate Assistance - Patient 50 - 74%    Lower Body  Dressing/Undressing Lower body dressing      What is the patient wearing?: Pants     Lower body assist Assist for lower body dressing: Maximal Assistance - Patient 25 - 49%     Toileting Toileting    Toileting assist Assist for toileting: Dependent - Patient 0%     Transfers Chair/bed transfer  Transfers assist     Chair/bed transfer assist level: Dependent - mechanical lift     Locomotion Ambulation   Ambulation assist   Ambulation activity did not occur: Safety/medical concerns  Assist level: Dependent - Patient 0% Assistive device: Lite Gait Max distance: 100'   Walk 10 feet activity   Assist  Walk 10 feet activity did not occur: Safety/medical concerns  Assist level: Dependent - Patient 0% Assistive device: Lite Gait   Walk 50 feet activity   Assist Walk 50 feet with 2 turns activity did not occur: Safety/medical concerns  Assist level: Dependent - Patient 0% Assistive device: Lite Gait    Walk 150 feet activity   Assist Walk 150 feet activity did not occur: Safety/medical concerns         Walk 10 feet on uneven surface  activity   Assist Walk 10 feet on uneven surfaces activity did not occur: Safety/medical concerns         Wheelchair     Assist Will patient use wheelchair at discharge?: Yes Type of Wheelchair:  (TBD)    Wheelchair assist level: Dependent - Patient 0% Max wheelchair distance: 150'    Wheelchair 50 feet with 2 turns activity    Assist        Assist Level: Dependent - Patient 0%   Wheelchair 150 feet activity     Assist      Assist Level: Dependent - Patient 0%   Blood pressure 103/68, pulse 64, temperature 98.4 F (36.9 C), resp. rate 20, height 5\' 10"  (1.778 m), weight 74.8 kg, SpO2 98 %.  Medical Problem List and Plan: 1.C4 Tetraplegia -ASIA Csecondary tocervical myelopathy/cervical synovial cyst. Status post C3-4 laminectomy with posterior arthrodesis cervical instrumentation  06/29/2020. Cervical brace as directed. -patient may Shower with neck brace/incision covered -ELOS/Goals: ~3 weeks- min A -will order PRAFOs and have pt wear nightly to prevent ankle contractures  5/26- con't PT and OT- spent 20 minutes going over ASIA level, and info about SCI including neurogenic bowel and bladder, spasticity and increased risk of DVT/pressure ulcers.   --Continue PT and OT- cover incision    2. Antithrombotics: -DVT/anticoagulation:Lovenox. Vascular study reviewed and negative.  5/26- will need for at least 2 months- maybe 3 months total from surgery. 6/3- will need for a total of 3 months  3. Pain Management:Baclofen 5 mg 3 times daily, oxycodone and Flexeril as needed  5/26- pain pretty well controlled- con't regimen  5/28-   con't regimen prn  5/31- will do R shoulder injection today.  6/3- Shoulder injection 5/31- R shoulder pain almost gone with injection  4. Mood:Provide emotional support  5/26- pt has been tearful understandably- will try Celexa 10 mg daily  5/31- tolerating, will increase to 20 mg daily  6/3- doing better- brighter affect -antipsychotic agents: N/A 5. Neuropsych: This patientiscapable of making decisions on hisown behalf. 6. Skin/Wound Care:Routine skin checks 7. Fluids/Electrolytes/Nutrition:Routine in and outs with follow-up chemistries 8. Atrial fibrillation. Lopressor 25 mg twice daily. Cardiac rate controlled  5/28 overall controlled- con't regimen  5/31- will decrease Metoprolol to 12.5 mg daily (was qday) since heart rate on low side and BP low- don't want him orthostatic due to SCI.   6/1- HR 54 this AM- they are holding Metoprolol- will con't to monitor  6/2- HR 71 this AM- con't regimen  9. Hypertension with orthostatic hypotension. Cozaar 50 mg daily. Monitor with increased mobility  5/28 elevated today.  5/30- BP 90s/50s- will monitor for orthostatic hypotension  5/31- will  decrease metoprolol as above  6/1- HR still low and BP is 90s/60s- con't regimen for now. Might need midodrine per PT, drops with therapy, but not dizzy.  10. BPH/UTI.Was onFlomax 0.4 mg daily.UTI greater than 80,000 Staphylococcus/Enterococcus completing course of Bactrim- please see neurogenic bladder- 11. Surgical PCR screening positive. Bactroban as directed 12. Hyperthyroidism- had lost 70 lbs in 6 months- in middle of treatment  6/1- per Endo, want labs checked before appointment 6/7- will order thyroid labs for tomorrow, so they are back for appointment.   6/2- TSH <0.010 and free T4 1.03- free T3 pending.   6/3- pt to have endo phone appt 6/9- asked him to let therapy know to schedule therapy around it.  13. Fatty liver-will need to be careful with any medicine going through liver. Placed nursing order to request that protein shake recommended by his hepatologist be given at night 14. Neurogenic bladder- will start on I/o caths q6 hours prn and increase Flomax to 0.8 mg with supper  5/25 no side effects with flomax- con't regimen  5/26- went over with pt, will need in/out caths or Foley until he's able to emtpy- I cannot guarantee he will get complete function back.   5/30- voided a little yesterday- con't Flomax and in/out caths  5/31- sounds like overflow, not real voiding- will verify with nursing they are cathing q6 hours. No prn- because he's having overflow incontinence.  6/1- pt will start trying to learn in/out cathing as of tomorrow- order placed   6/2- d/w nursing and OT, needs to learn how to cath. Can try different catheters? 15. Neurogenic bowel- doesn't have good control- will start with suppository- and dig stim and see if can withdraw any of them over time.   6/2- Pt having good results with bowel program- still requiring some dig stim, but less.  16. Spasticity- will increase Baclofen to 10 mg TID and con't Flexeril.  D/c'd flexeril  5/31- will d/w pt going up  on baclofen cannot do zanaflex due to low BP and cannot do dantrolene due to liver issues.  6/1- Spasms/spasticity controlled per pt- don't change dosing.   17. Orthostatic hypotension  5/26- added TEDs and Abd binder during day.  5/31- decreased metoprolol  18. Ileus  5/29 -pt much improved today   -likely d/t neuro, meds, etc   -continue clear liquid diet   -continue IV reglan    -primary team can  adjust bowel program tomorrow   -recheck KUB in AM for resolution   5/30- will restart regular diet- ileus resolved- also will increase senokot a little- reason got ileus is was refusing bowel program.  19. R shoulder pain  5/30- will try to do R shoulder injection tomorrow 5/31- R shoulder injections for subacromial and RTC/posterior shoulder injection today  6/2- pain MUCH better- con't regimen  5/31-received steroid injection.  20. Nausea: resolved: changed valium and reglan to PRN      LOS: 11 days A FACE TO [place] Aiden Rao 07/16/2020, 10:47 AM

## 2020-07-16 NOTE — Progress Notes (Addendum)
Patient has been voiding today both continent and incontinent. Residual volumes have been low. Please see I/O flowsheet for detailed scan volumes. Bowel program started at [place]. Dig stim done prior to suppository insertion. Rectal vault clear. Dig stim done again x1. No stool noted. Passed to oncoming shift to complete.

## 2020-07-17 MED ORDER — POLYETHYLENE GLYCOL 3350 17 G PO PACK
17.0000 g | PACK | Freq: Every day | ORAL | Status: DC
  Administered 2020-07-17 – 2020-07-21 (×5): 17 g via ORAL
  Filled 2020-07-17 (×5): qty 1

## 2020-07-17 NOTE — Progress Notes (Signed)
Physical Therapy Session Note  Patient Details  Name: Darren Harrison MRN: [id number] Date of Birth: 07-13-1955  Today's Date: 07/17/2020 PT Individual Time: 0800-0900 PT Individual Time Calculation (min): 60 min   Short Term Goals: Week 2:  PT Short Term Goal 1 (Week 2): Pt will perform bed mobility with mod A consistently PT Short Term Goal 2 (Week 2): Pt will perform least restrictive transfer with assist x 1 PT Short Term Goal 3 (Week 2): Pt will maintain standing with LRAD x 5 min PT Short Term Goal 4 (Week 2): Pt will maintain sitting balance x 5 min with min A  Skilled Therapeutic Interventions/Progress Updates:    Pt received seated in bed finishing eating breakfast, agreeable to PT session. No complaints of pain. Pt's wife present and able to assist pt with LB dressing at bed level. Pt is dependent to don knee-high TEDs and shoes, max A to don pants. Pt able to bridge in supine and assist with pulling pants up over hips. Supine to sit with CGA from flat bed with use of bedrail, increased time needed to complete. Pt initially min A x 2 for sit to stand to stedy, increases to max A x 2 towards end of session with onset of fatigue. Session focus on standing and LE strengthening. Standing in standing frame x 10 min while performing 2 x 10 reps of mini-squats with decreased support from standing frame sling. Sit to stand in // bars to assess pt's ability to hold himself up with decreased support from external equipment. Pt requires total A x 2 to stand in // bars with B knees blocked and assist to maintain upright trunk. Pt unable to keep B knees in extended position without support anteriorly. Standing in stedy with focus on maintaining midline as pt tends to weight shift onto LLE as well as reaching alt with UE. Pt only able to perform alt UE reaching x 3 reps before losing balance and core control and has uncontrolled sit onto stedy seat. Deferred further standing activity this session due to  fatigue. Seated BLE strengthening therex: marches, LAQ, toe raises x 10 reps each. Pt left semi-reclined in TIS chair in room with needs in reach, wife present.  Therapy Documentation Precautions:  Precautions Precautions: Cervical,Fall Precaution Comments: reviewed precautions Required Braces or Orthoses: Cervical Brace Cervical Brace: Hard collar,Other (comment) (donned in sitting) Restrictions Weight Bearing Restrictions: No   Therapy/Group: Individual Therapy   [name], PT, DPT, CSRS  07/17/2020, 12:02 PM

## 2020-07-17 NOTE — Progress Notes (Signed)
Patient continuing to void continent today in urinal with occassional incontinence episode. Requring cath to fully empty bladder. Wife demonstrated successful cathing this evening with coude tip cath. Continue education with family.

## 2020-07-17 NOTE — Progress Notes (Signed)
PROGRESS NOTE   Subjective/Complaints: Received his protein shake last night BM just a smear after bowel program  ROS:   Pt denies SOB, abd pain, CP, N/V/C/D, and vision changes      Objective:   No results found. No results for input(s): WBC, HGB, HCT, PLT in the last 72 hours. No results for input(s): NA, K, CL, CO2, GLUCOSE, BUN, CREATININE, CALCIUM in the last 72 hours.  Intake/Output Summary (Last 24 hours) at 07/17/2020 1457 Last data filed at 07/17/2020 1300 Gross per 24 hour  Intake 600 ml  Output 2375 ml  Net -1775 ml     Pressure Injury 06/28/20 Thigh Left;Posterior;Proximal Stage 3 -  Full thickness tissue loss. Subcutaneous fat may be visible but bone, tendon or muscle are NOT exposed. (Active)  06/28/20 1600 (Assessed at office prior to admission)  Location: Thigh  Location Orientation: Left;Posterior;Proximal  Staging: Stage 3 -  Full thickness tissue loss. Subcutaneous fat may be visible but bone, tendon or muscle are NOT exposed.  Wound Description (Comments):   Present on Admission: Yes     Pressure Injury 07/05/20 Coccyx Mid Stage 1 -  Intact skin with non-blanchable redness of a localized area usually over a bony prominence. (Active)  07/05/20 1540  Location: Coccyx  Location Orientation: Mid  Staging: Stage 1 -  Intact skin with non-blanchable redness of a localized area usually over a bony prominence.  Wound Description (Comments):   Present on Admission: Yes    Physical Exam: Vital Signs Blood pressure 106/65, pulse (!) 55, temperature 97.7 F (36.5 C), resp. rate 17, height 5\' 10"  (1.778 m), weight 74.8 kg, SpO2 97 %. Gen: no distress, normal appearing HEENT: oral mucosa pink and moist, NCAT Cardio: Bradycardic Chest: normal effort, normal rate of breathing Abd: soft, non-distended Ext: no edema Psych: pleasant, normal affect Skin: intact Musculoskeletal:  General: No swelling.  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 Sensation: RUE C4 ok- decreased from C5 to S1 on R LUE- C5 OK- decreased from C6 to S1 on L   Assessment/Plan: 1. Functional deficits which require 3+ hours per day of interdisciplinary therapy in a comprehensive inpatient rehab setting.  Physiatrist is providing close team supervision and 24 hour management of active medical problems listed below.  Physiatrist and rehab team continue to assess barriers to  discharge/monitor patient progress toward functional and medical goals  Care Tool:  Bathing  Bathing activity did not occur: Refused Body parts bathed by patient: Right arm,Left arm,Chest,Abdomen,Front perineal area,Right upper leg,Left upper leg,Face   Body parts bathed by helper: Buttocks,Right lower leg,Left lower leg     Bathing assist Assist Level: Moderate Assistance - Patient 50 - 74%     Upper Body Dressing/Undressing Upper body dressing   What is the patient wearing?: Pull over shirt    Upper body assist Assist Level: Moderate Assistance - Patient 50 - 74%    Lower Body Dressing/Undressing Lower body dressing      What is the patient wearing?:  Pants     Lower body assist Assist for lower body dressing: Maximal Assistance - Patient 25 - 49%     Toileting Toileting    Toileting assist Assist for toileting: Dependent - Patient 0%     Transfers Chair/bed transfer  Transfers assist     Chair/bed transfer assist level: Dependent - mechanical lift     Locomotion Ambulation   Ambulation assist   Ambulation activity did not occur: Safety/medical concerns  Assist level: Dependent - Patient 0% Assistive device: Lite Gait Max distance: 100'   Walk 10 feet activity   Assist  Walk 10 feet activity did not occur: Safety/medical concerns  Assist level: Dependent - Patient 0% Assistive device: Lite Gait   Walk 50 feet activity   Assist Walk 50 feet with 2 turns activity did not occur: Safety/medical concerns  Assist level: Dependent - Patient 0% Assistive device: Lite Gait    Walk 150 feet activity   Assist Walk 150 feet activity did not occur: Safety/medical concerns         Walk 10 feet on uneven surface  activity   Assist Walk 10 feet on uneven surfaces activity did not occur: Safety/medical concerns         Wheelchair     Assist Will patient use wheelchair at discharge?: Yes Type of Wheelchair:  (TBD)    Wheelchair assist level: Dependent - Patient 0% Max wheelchair distance: 150'    Wheelchair 50 feet with 2 turns activity    Assist        Assist Level: Dependent - Patient 0%   Wheelchair 150 feet activity     Assist      Assist Level: Dependent - Patient 0%   Blood pressure 106/65, pulse (!) 55, temperature 97.7 F (36.5 C), resp. rate 17, height 5\' 10"  (1.778 m), weight 74.8 kg, SpO2 97 %.  Medical Problem List and Plan: 1.C4 Tetraplegia -ASIA Csecondary tocervical myelopathy/cervical synovial cyst. Status post C3-4 laminectomy with posterior arthrodesis cervical instrumentation 06/29/2020. Cervical brace as directed. -patient may Shower  with neck brace/incision covered -ELOS/Goals: ~3 weeks- min A -will order PRAFOs and have pt wear nightly to prevent ankle contractures  5/26- con't PT and OT- spent 20 minutes going over ASIA level, and info about SCI including neurogenic bowel and bladder, spasticity and increased risk of DVT/pressure ulcers.   --Continue PT and OT- cover incision    2. Antithrombotics: -DVT/anticoagulation:Lovenox. Vascular study reviewed and negative.  5/26- will need for at least 2 months- maybe 3 months total from surgery. 6/3- will need for a total of 3 months  3. Pain Management:Baclofen 5 mg 3 times daily, oxycodone and Flexeril as needed  5/26- pain pretty well controlled- con't regimen  5/28-   con't regimen prn  5/31- will do R shoulder injection today.  6/3- Shoulder injection 5/31- R shoulder pain almost gone with injection  4. Mood:Provide emotional support  5/26- pt has been tearful understandably- will try Celexa 10 mg daily  5/31- tolerating, will increase to 20 mg daily  6/3- doing better- brighter affect -antipsychotic agents: N/A 5. Neuropsych: This patientiscapable of making decisions on hisown behalf. 6. Skin/Wound Care:Routine skin checks 7. Fluids/Electrolytes/Nutrition:Routine in and outs with follow-up chemistries 8. Atrial fibrillation. Lopressor 25 mg twice daily. Cardiac rate controlled  5/28 overall controlled- con't regimen  5/31- will decrease Metoprolol to 12.5 mg daily (was qday) since heart rate on low side and BP low- don't want him orthostatic due to SCI.   6/1- HR 54 this AM- they are holding Metoprolol- will con't to monitor  6/2- HR 71 this AM- con't regimen  9. Hypertension with orthostatic hypotension. Cozaar 50 mg daily. Monitor with increased mobility  5/28 elevated today.  5/30- BP 90s/50s- will monitor for orthostatic hypotension  5/31- will decrease metoprolol as above  6/1- HR still low and BP is 90s/60s- con't  regimen for now. Might need midodrine per PT, drops with therapy, but not dizzy.  10. BPH/UTI.Was onFlomax 0.4 mg daily.UTI greater than 80,000 Staphylococcus/Enterococcus completing course of Bactrim- please see neurogenic bladder- 11. Surgical PCR screening positive. Bactroban as directed 12. Hyperthyroidism- had lost 70 lbs in 6 months- in middle of treatment  6/1- per Endo, want labs checked before appointment 6/7- will order thyroid labs for tomorrow, so they are back for appointment.   6/2- TSH <0.010 and free T4 1.03- free T3 pending.   6/3- pt to have endo phone appt 6/9- asked him to let therapy know to schedule therapy around it.  13. Fatty liver-will need to be careful with any medicine going through liver. Placed nursing order to request that protein shake recommended by his hepatologist be given at night 14. Neurogenic bladder- will start on I/o caths q6 hours prn and increase Flomax to 0.8 mg with supper  5/25 no side effects with flomax- con't regimen  5/26- went over with pt, will need in/out caths or Foley until he's able to emtpy- I cannot guarantee he will get complete function back.   5/30- voided a little yesterday- con't Flomax and in/out caths  5/31- sounds like overflow, not real voiding- will verify with nursing they are cathing q6 hours. No prn- because he's having overflow incontinence.  6/1- pt will start trying to learn in/out cathing as of tomorrow- order placed   6/2- d/w nursing and OT, needs to learn how to cath. Can try different catheters? 15. Neurogenic bowel- doesn't have good control- will start with suppository- and dig stim and see if can withdraw any of them over time.   6/2- Pt having good results with bowel program- still requiring some dig stim, but less.   6/5: add miralax mixed in prune juice to dinner.  16. Spasticity- will increase Baclofen to 10 mg TID and con't Flexeril.  D/c'd flexeril  5/31- will d/w pt going up on baclofen cannot do  zanaflex due to low BP and cannot do dantrolene due to liver issues.  6/5- Spasms/spasticity controlled per pt- don't change dosing, continue current regimen 17. Orthostatic hypotension  5/26- added TEDs and Abd binder during day.  5/31- decreased metoprolol  18. Ileus  5/29 -pt much improved today   -likely d/t neuro, meds, etc   -continue clear liquid diet   -continue IV reglan    -primary team can adjust bowel program  tomorrow   -recheck KUB in AM for resolution   5/30- will restart regular diet- ileus resolved- also will increase senokot a little- reason got ileus is was refusing bowel program.  19. R shoulder pain  5/30- will try to do R shoulder injection tomorrow 5/31- R shoulder injections for subacromial and RTC/posterior shoulder injection today  6/2-6/5 pain MUCH better- continue regimen  5/31-received steroid injection.  20. Nausea: resolved: changed valium and reglan to PRN      LOS: 12 days A FACE TO FACE EVALUATION WAS PERFORMED  Martha Clan P Jaquae Rieves 07/17/2020, 2:57 PM

## 2020-07-17 NOTE — Progress Notes (Signed)
Bowel program started at 1800. Dig stim done-soft stool noted in rectal vault. Suppository inserted and dig stim done again x 3. Patient then assisted to toilet where patient had large bowel movement. Rectal vault clear.

## 2020-07-17 NOTE — Progress Notes (Signed)
Occupational Therapy Session Note  Patient Details  Name: Darren Harrison MRN: [id number] Date of Birth: 08-13-1955  Today's Date: 07/17/2020 OT Individual Time: 3557-3220 OT Individual Time Calculation (min): 44 min    Short Term Goals: Week 2:  OT Short Term Goal 1 (Week 2): Pt will perform self feeding with Min A with AE PRN OT Short Term Goal 2 (Week 2): Pt will improve trunk control/sitting balance to sit EOB/EOM for 5 mins with no more than Min A OT Short Term Goal 3 (Week 2): Pt will complete sit <> stands in [place] with Max of 1  Skilled Therapeutic Interventions/Progress Updates:    OT intervention with focus on BUE AROM and strengthening. Pt remained seated in w/c for table tasks with focus on BUE AROM with gravity eliminated. Pt grasped clothes pins (yellow and red) and placed/removed from horizontal dowels. Pt utilizes compensatory strategies with grasp (more lateral pinch vs key grip). Pt also used yellow theraputty for hand strengtheneing and pinch. Pt stated he was incontinent of bladder and returned to room. Sit<>stand in [place] with mod A +1. Sit>supine with max A+1. Pt remained in bed with NT present for hygiene and to scan bladder.   Therapy Documentation Precautions:  Precautions Precautions: Cervical,Fall Precaution Comments: reviewed precautions Required Braces or Orthoses: Cervical Brace Cervical Brace: Hard collar,Other (comment) (donned in sitting) Restrictions Weight Bearing Restrictions: No   Pain:  Pt with no c/o pain this morning   Therapy/Group: Individual Therapy  [name] 07/17/2020, 10:20 AM

## 2020-07-17 NOTE — Plan of Care (Signed)
  Problem: Consults Goal: RH SPINAL CORD INJURY PATIENT EDUCATION Description:  See Patient Education module for education specifics.  Outcome: Progressing   Problem: SCI BOWEL ELIMINATION Goal: RH STG MANAGE BOWEL WITH ASSISTANCE Description: STG Manage Bowel with  min Assistance. Outcome: Progressing Goal: RH STG SCI MANAGE BOWEL PROGRAM W/ASSIST OR AS APPROPRIATE Description: STG SCI Manage bowel program w/ min assist or as appropriate. Outcome: Progressing   Problem: SCI BLADDER ELIMINATION Goal: RH STG MANAGE BLADDER WITH ASSISTANCE Description: STG Manage Bladder With  min Assistance Outcome: Progressing Goal: RH STG MANAGE BLADDER WITH MEDICATION WITH ASSISTANCE Description: STG Manage Bladder With Medication With  mod I Assistance. Outcome: Progressing   Problem: RH SKIN INTEGRITY Goal: RH STG SKIN FREE OF INFECTION/BREAKDOWN Description: No new skin issues and healing chronic issues with min assist Outcome: Progressing Goal: RH STG MAINTAIN SKIN INTEGRITY WITH ASSISTANCE Description: STG Maintain Skin Integrity With  min Assistance. Outcome: Progressing Goal: RH STG ABLE TO PERFORM INCISION/WOUND CARE W/ASSISTANCE Description: STG Able To Perform Incision/Wound Care With  min Assistance. Outcome: Progressing   Problem: RH SAFETY Goal: RH STG ADHERE TO SAFETY PRECAUTIONS W/ASSISTANCE/DEVICE Description: STG Adhere to Safety Precautions With cues/reminders Assistance/Device. Outcome: Progressing   Problem: RH PAIN MANAGEMENT Goal: RH STG PAIN MANAGED AT OR BELOW PT'S PAIN GOAL Description: At or below level 4 Outcome: Progressing   Problem: RH KNOWLEDGE DEFICIT SCI Goal: RH STG INCREASE KNOWLEDGE OF SELF CARE AFTER SCI Description: Patient will be able to direct care and wife will be able to manage care at discharge using equipment handouts and educational materials independently Outcome: Progressing

## 2020-07-18 LAB — COMPREHENSIVE METABOLIC PANEL
ALT: 44 U/L (ref 0–44)
AST: 20 U/L (ref 15–41)
Albumin: 2.6 g/dL — ABNORMAL LOW (ref 3.5–5.0)
Alkaline Phosphatase: 130 U/L — ABNORMAL HIGH (ref 38–126)
Anion gap: 4 — ABNORMAL LOW (ref 5–15)
BUN: 19 mg/dL (ref 8–23)
CO2: 30 mmol/L (ref 22–32)
Calcium: 8.7 mg/dL — ABNORMAL LOW (ref 8.9–10.3)
Chloride: 103 mmol/L (ref 98–111)
Creatinine, Ser: 0.73 mg/dL (ref 0.61–1.24)
GFR, Estimated: >60 mL/min (ref >60)
Glucose, Bld: 97 mg/dL (ref 70–99)
Potassium: 4.5 mmol/L (ref 3.5–5.1)
Sodium: 137 mmol/L (ref 135–145)
Total Bilirubin: 0.2 mg/dL — ABNORMAL LOW (ref 0.3–1.2)
Total Protein: 5.1 g/dL — ABNORMAL LOW (ref 6.5–8.1)

## 2020-07-18 LAB — CBC WITH DIFFERENTIAL/PLATELET
Abs Immature Granulocytes: 0.11 10*3/uL — ABNORMAL HIGH (ref 0.00–0.07)
Basophils Absolute: 0 10*3/uL (ref 0.0–0.1)
Basophils Relative: 0 %
Eosinophils Absolute: 0.1 10*3/uL (ref 0.0–0.5)
Eosinophils Relative: 1 %
HCT: 36.2 % — ABNORMAL LOW (ref 39.0–52.0)
Hemoglobin: 12 g/dL — ABNORMAL LOW (ref 13.0–17.0)
Immature Granulocytes: 2 %
Lymphocytes Relative: 32 %
Lymphs Abs: 2 10*3/uL (ref 0.7–4.0)
MCH: 29.4 pg (ref 26.0–34.0)
MCHC: 33.1 g/dL (ref 30.0–36.0)
MCV: 88.7 fL (ref 80.0–100.0)
Monocytes Absolute: 0.4 10*3/uL (ref 0.1–1.0)
Monocytes Relative: 7 %
Neutro Abs: 3.5 10*3/uL (ref 1.7–7.7)
Neutrophils Relative %: 58 %
Platelets: 202 10*3/uL (ref 150–400)
RBC: 4.08 MIL/uL — ABNORMAL LOW (ref 4.22–5.81)
RDW: 15.7 % — ABNORMAL HIGH (ref 11.5–15.5)
WBC: 6.1 10*3/uL (ref 4.0–10.5)
nRBC: 0 % (ref 0.0–0.2)

## 2020-07-18 NOTE — Progress Notes (Signed)
Occupational Therapy Session Note  Patient Details  Name: Darren Harrison MRN: [id number] Date of Birth: 09-23-1955  Today's Date: 07/18/2020 OT Individual Time: 8185-6314 OT Individual Time Calculation (min): 75 min    Short Term Goals: Week 2:  OT Short Term Goal 1 (Week 2): Pt will perform self feeding with Min A with AE PRN OT Short Term Goal 2 (Week 2): Pt will improve trunk control/sitting balance to sit EOB/EOM for 5 mins with no more than Min A OT Short Term Goal 3 (Week 2): Pt will complete sit <> stands in [place] with Max of 1  Skilled Therapeutic Interventions/Progress Updates:    Pt resting in bed upon arrival. Pt incontinent of bladder (RN notified). Initial focus on bed mobility-rolling R/L and supine>sit EOB. Min A for rolling R/L and supine>sit EOB. Pt able to move BLE off EOB and initiate pushing up from sidelying but required min A to complete supine>sit. Sit<>stand in [place] with mod A+2. Pt completed UB dressing tasks seated in w/c with mod A. P transferred to EOM with Stedy. Focus on sitting balance, BUE strengthening, and lateral leans to increase functional use of BUE in unsupported sitting. Pt maintains static and minimally dynamic sitting balance with CGA (pt with significant posterior pelvic tilt). Pt tossed soccer ball to tech 4x5 while maintaining balance. Pt performed RUE biceps curls with 3# barbell seated unsupported-3x5. Pt performed lateral leans to R and L onto elbow and pushing up-3x5. Pt returned to room and remained in w/c with all needs within reach.   Therapy Documentation Precautions:  Precautions Precautions: Cervical,Fall Precaution Comments: reviewed precautions Required Braces or Orthoses: Cervical Brace Cervical Brace: Hard collar,Other (comment) (donned in sitting) Restrictions Weight Bearing Restrictions: No Pain:  Pt c/o "stifness" which improved with activity   Therapy/Group: Individual Therapy  [name] 07/18/2020, 9:51 AM

## 2020-07-18 NOTE — Progress Notes (Signed)
PROGRESS NOTE   Subjective/Complaints: Pt reports getting more feeling in LEs- but now his R knee (which needs replacing) is hurting a lot- had an injection of R knee 5/1- so cannot do again.  Had good BM on toilet after suppository and dig stim last night.  Spasms less frequent AND less intense- much better.   Urinating a little- but not emptying, per pt.   Wife has learned how to cath with coude' tip- and urinating in urinal, just not emptying yet.   BP 86/54 this AM.  ROS:   Pt denies SOB, abd pain, CP, N/V/C/D, and vision changes     Objective:   No results found. Recent Labs    07/18/20 0615  WBC 6.1  HGB 12.0*  HCT 36.2*  PLT 202   Recent Labs    07/18/20 0615  NA 137  K 4.5  CL 103  CO2 30  GLUCOSE 97  BUN 19  CREATININE 0.73  CALCIUM 8.7*    Intake/Output Summary (Last 24 hours) at 07/18/2020 1359 Last data filed at 07/18/2020 1314 Gross per 24 hour  Intake 720 ml  Output 2750 ml  Net -2030 ml     Pressure Injury 06/28/20 Thigh Left;Posterior;Proximal Stage 3 -  Full thickness tissue loss. Subcutaneous fat may be visible but bone, tendon or muscle are NOT exposed. (Active)  06/28/20 1600 (Assessed at office prior to admission)  Location: Thigh  Location Orientation: Left;Posterior;Proximal  Staging: Stage 3 -  Full thickness tissue loss. Subcutaneous fat may be visible but bone, tendon or muscle are NOT exposed.  Wound Description (Comments):   Present on Admission: Yes     Pressure Injury 07/05/20 Coccyx Mid Stage 1 -  Intact skin with non-blanchable redness of a localized area usually over a bony prominence. (Active)  07/05/20 1540  Location: Coccyx  Location Orientation: Mid  Staging: Stage 1 -  Intact skin with non-blanchable redness of a localized area usually over a bony prominence.  Wound Description (Comments):   Present on Admission: Yes    Physical Exam: Vital Signs Blood  pressure (!) 86/54, pulse 77, temperature 98.9 F (37.2 C), resp. rate 18, height 5\' 10"  (1.778 m), weight 74.8 kg, SpO2 98 %.    General: awake, alert, appropriate, sitting up in bed;  NAD HENT: conjugate gaze; oropharynx moist CV: regular rate; no JVD Pulmonary: CTA B/L; no W/R/R- good air movement GI: soft, NT, ND, (+)BS Psychiatric: appropriate; interactive Neurological: Ox3; MAS of 1 in LEs- but no spasms with touch this AM! Skin: intact- buttock wounds as above Musculoskeletal:  General: No swelling. 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 Sensation: RUE C4 ok- decreased from C5 to S1 on R LUE- C5 OK- decreased from C6 to S1 on L   Assessment/Plan: 1. Functional deficits which require 3+ hours per day of interdisciplinary therapy in a comprehensive inpatient rehab setting.  Physiatrist is providing close team supervision and 24 hour management of active medical problems listed below.  Physiatrist and rehab team continue to assess barriers to discharge/monitor patient progress toward functional and medical goals  Care Tool:  Bathing  Bathing activity did not occur: Refused Body parts bathed by  patient: Right arm,Left arm,Chest,Abdomen,Front perineal area,Right upper leg,Left upper leg,Face   Body parts bathed by helper: Buttocks,Right lower leg,Left lower leg     Bathing assist Assist Level: Moderate Assistance - Patient 50 - 74%     Upper Body Dressing/Undressing Upper body dressing   What is the patient wearing?: Pull over shirt    Upper body assist Assist Level: Moderate Assistance - Patient 50 - 74%    Lower Body Dressing/Undressing Lower body dressing      What is the patient wearing?: Pants     Lower body assist Assist for lower body dressing: Maximal Assistance - Patient 25 - 49%     Toileting Toileting    Toileting assist Assist for toileting: Dependent - Patient 0%     Transfers Chair/bed transfer  Transfers assist     Chair/bed transfer assist level: Minimal Assistance - Patient > 75% (slide board)     Locomotion Ambulation   Ambulation assist   Ambulation activity did not occur: Safety/medical concerns  Assist level: Dependent - Patient 0% Assistive device: Lite Gait Max distance: 100'   Walk 10 feet activity   Assist  Walk 10 feet activity did not occur: Safety/medical concerns  Assist level: Dependent - Patient 0% Assistive device: Lite Gait   Walk 50 feet activity   Assist Walk 50 feet with 2 turns activity did not occur: Safety/medical concerns  Assist level: Dependent - Patient 0% Assistive device: Lite Gait    Walk 150 feet activity   Assist Walk 150 feet activity did not occur: Safety/medical concerns         Walk 10 feet on uneven surface  activity   Assist Walk 10 feet on uneven surfaces activity did not occur: Safety/medical concerns         Wheelchair     Assist Will patient use wheelchair at discharge?: Yes Type of Wheelchair: Manual    Wheelchair assist level: Supervision/Verbal cueing Max wheelchair distance: 150'    Wheelchair 50 feet with 2 turns activity    Assist         Assist Level: Supervision/Verbal cueing   Wheelchair 150 feet activity     Assist      Assist Level: Supervision/Verbal cueing   Blood pressure (!) 86/54, pulse 77, temperature 98.9 F (37.2 C), resp. rate 18, height 5\' 10"  (1.778 m), weight 74.8 kg, SpO2 98 %.  Medical Problem List and Plan: 1.C4 Tetraplegia -ASIA Csecondary tocervical myelopathy/cervical synovial cyst. Status post C3-4 laminectomy with posterior arthrodesis cervical instrumentation 06/29/2020. Cervical brace as directed. -patient may Shower with neck brace/incision covered -ELOS/Goals: ~3 weeks- min A -will order  PRAFOs and have pt wear nightly to prevent ankle contractures  5/26- con't PT and OT- spent 20 minutes going over ASIA level, and info about SCI including neurogenic bowel and bladder, spasticity and increased risk of DVT/pressure ulcers.   --Continue PT and OT- cover incision    2. Antithrombotics: -DVT/anticoagulation:Lovenox. Vascular study reviewed and negative.  5/26- will need for at least 2 months- maybe 3 months total from surgery. 6/3- will need for a total of 3 months  3. Pain Management:Baclofen 5 mg 3 times daily, oxycodone and Flexeril as needed  5/26- pain pretty well controlled- con't regimen  5/28-   con't regimen prn  5/31- will do R shoulder injection today.  6/3- Shoulder injection 5/31- R shoulder pain almost gone with injection  4. Mood:Provide emotional support  5/26- pt has been tearful understandably- will try Celexa 10 mg daily  5/31- tolerating, will increase to 20 mg daily  6/6- happy with improving results, so brighter- con't regimen -antipsychotic agents: N/A 5. Neuropsych: This patientiscapable of making decisions on hisown behalf. 6. Skin/Wound Care:Routine skin checks 7. Fluids/Electrolytes/Nutrition:Routine in and outs with follow-up chemistries 8. Atrial fibrillation. Lopressor 25 mg twice daily. Cardiac  rate controlled  5/28 overall controlled- con't regimen  5/31- will decrease Metoprolol to 12.5 mg daily (was qday) since heart rate on low side and BP low- don't want him orthostatic due to SCI.   6/1- HR 54 this AM- they are holding Metoprolol- will con't to monitor  6/2- HR 71 this AM- con't regimen  9. Hypertension with orthostatic hypotension. Cozaar 50 mg daily. Monitor with increased mobility  5/28 elevated today.  5/30- BP 90s/50s- will monitor for orthostatic hypotension  5/31- will decrease metoprolol as above  6/1- HR still low and BP is 90s/60s- con't regimen for now. Might need midodrine per PT, drops with therapy, but not dizzy.   6/6- will ask again due to BP 86/54 this AM- might need midodrine? 10. BPH/UTI.Was onFlomax 0.4 mg daily.UTI greater than 80,000 Staphylococcus/Enterococcus completing course of Bactrim- please see neurogenic bladder- 11. Surgical PCR screening positive. Bactroban as directed 12. Hyperthyroidism- had lost 70 lbs in 6 months- in middle of treatment  6/1- per Endo, want labs checked before appointment 6/7- will order thyroid labs for tomorrow, so they are back for appointment.   6/2- TSH <0.010 and free T4 1.03- free T3 pending.   6/3- pt to have endo phone appt 6/9- asked him to let therapy know to schedule therapy around it.  13. Fatty liver-will need to be careful with any medicine going through liver. Placed nursing order to request that protein shake recommended by his hepatologist be given at night 14. Neurogenic bladder- will start on I/o caths q6 hours prn and increase Flomax to 0.8 mg with supper  5/25 no side effects with flomax- con't regimen  5/26- went over with pt, will need in/out caths or Foley until he's able to emtpy- I cannot guarantee he will get complete function back.   5/30- voided a little yesterday- con't Flomax and in/out caths  5/31- sounds like overflow, not real voiding- will verify with nursing they are cathing q6  hours. No prn- because he's having overflow incontinence.  6/1- pt will start trying to learn in/out cathing as of tomorrow- order placed   6/2- d/w nursing and OT, needs to learn how to cath. Can try different catheters?  6/6- voiding some, but wife also taught on cathing as well. con't regimen 15. Neurogenic bowel- doesn't  have good control- will start with suppository- and dig stim and see if can withdraw any of them over time.   6/2- Pt having good results with bowel program- still requiring some dig stim, but less.   6/5: add miralax mixed in prune juice to dinner.   6/6- BM on toilet with dig stim/supp- improving!- con't regimen 16. Spasticity- will increase Baclofen to 10 mg TID and con't Flexeril.  D/c'd flexeril  5/31- will d/w pt going up on baclofen cannot do zanaflex due to low BP and cannot do dantrolene due to liver issues.  6/5- Spasms/spasticity controlled per pt- don't change dosing, continue current regimen 17. Orthostatic hypotension  5/26- added TEDs and Abd binder during day.  5/31- decreased metoprolol  18. Ileus  5/29 -pt much improved today   -likely d/t neuro, meds, etc   -continue clear liquid diet   -continue IV reglan    -primary team can adjust bowel program tomorrow   -recheck KUB in AM for resolution   5/30- will restart regular diet- ileus resolved- also will increase senokot a little- reason got ileus is was refusing bowel program.  19. R shoulder pain  5/30- will try to do R shoulder injection tomorrow 5/31- R shoulder injections for subacromial and RTC/posterior shoulder injection today  6/2-6/5 pain MUCH better- continue regimen  5/31-received steroid injection.  20. Nausea: resolved: changed valium and reglan to PRN      LOS: 13 days A FACE TO FACE EVALUATION WAS PERFORMED  Sharniece Gibbon 07/18/2020, 1:59 PM

## 2020-07-18 NOTE — Progress Notes (Signed)
Physical Therapy Session Note  Patient Details  Name: Darren Harrison MRN: [id number] Date of Birth: Nov 16, 1955  Today's Date: 07/18/2020 PT Individual Time: 1000-1100; 1415-1530 PT Individual Time Calculation (min): 60 min and 75 min  Short Term Goals: Week 2:  PT Short Term Goal 1 (Week 2): Pt will perform bed mobility with mod A consistently PT Short Term Goal 2 (Week 2): Pt will perform least restrictive transfer with assist x 1 PT Short Term Goal 3 (Week 2): Pt will maintain standing with LRAD x 5 min PT Short Term Goal 4 (Week 2): Pt will maintain sitting balance x 5 min with min A  Skilled Therapeutic Interventions/Progress Updates:    Session 1: Pt received seated in TIS chair in room, agreeable to PT session. No complaints of pain. Pt wearing knee-high TEDs and abdominal binder, reports low BP this AM. Seated BP 97/70, no complaints of orthostatic symptoms during session. Re-introduced slide board transfer this session due to improved core strength and sitting balance. Pt initially requires assist x 2 for placement of board. Slide board transfer downhill with min A TIS to mat table. Pt is mod A to slide uphill and to the L but overall improved from assist x 2 needed to perform slide board transfer last week. Introduced use of manual wheelchair this session. Pt able to propel himself 2 x 150 ft with use of BUE at Supervision level with cues for propulsion technique. Reviewed pressure relief techniques including w/c push-ups and lateral leans. Pt unable to manage w/c armrests independently, able to perform w/c push-ups independently. Encouraged patient to perform pressure relief every 15-20 min while seated up in chair throughout the day. Pt requests to return to bed at end of session for bladder scan. Slide board transfer back to bed with min A. Sit to supine mod A for BLE management. Pt left seated in bed in care of NT.  Session 2: Pt received seated in bed, agreeable to PT session. No  complaints of pain at rest, has onset of B knee pain in [place] position that improves at rest. Session focus on standing and gait training with LiteGait. Supine to sit with min A for trunk elevation from flat bed. Assisted pt with donning hard collar and abdominal binder while seated EOB. Slide board transfer to w/c with min A. Dependent transport via w/c to/from day room for energy conservation. Sit to stand in stedy with max A to +2 for placement of LiteGait sling on patient. While standing in LiteGait with decreased support from sling patient able to perform x 7 mini-squats, knees blocked. Pt has onset of knee pain with squats. Ambulation x 30 ft, x 90 ft with LiteGait with assist for lateral weight shift and occasional assist for placement of BLE due to scissoring of LE and ataxic limbs. Pt unable to maintain knee extension without increased unweighting via LiteGait sling. Seated rest break on therapy ball for increased core challenge. Pt found to be incontinent of bowel following gait training. Slide board transfer back to bed with min A. Sit to supine mod A for BLE management. Rolling R/L with min A for dependent brief change and pericare. Pt left seated in bed with needs in reach, bed alarm in place at end of session.  Therapy Documentation Precautions:  Precautions Precautions: Cervical,Fall Precaution Comments: reviewed precautions Required Braces or Orthoses: Cervical Brace Cervical Brace: Hard collar,Other (comment) (donned in sitting) Restrictions Weight Bearing Restrictions: No   Therapy/Group: Individual Therapy   [name],  PT, DPT, CSRS  07/18/2020, 12:08 PM

## 2020-07-18 NOTE — Progress Notes (Addendum)
Wife at bedside: asked to do I/O cath, said no that had already done 2x and will do when she is at home. Asked wife if was going to do bowel program. Again she declined. Educated that needed to do task before going home. "I will do it some other time."  Bowel program: cleared rectal vault with min results or brown stool, encouraged next shift to complete bowel program that still needs dig stem.

## 2020-07-19 ENCOUNTER — Telehealth (INDEPENDENT_AMBULATORY_CARE_PROVIDER_SITE_OTHER): Payer: 59 | Admitting: "Endocrinology

## 2020-07-19 ENCOUNTER — Encounter: Payer: Self-pay | Admitting: "Endocrinology

## 2020-07-19 VITALS — Ht 70.0 in | Wt 165.0 lb

## 2020-07-19 DIAGNOSIS — E0500 Thyrotoxicosis with diffuse goiter without thyrotoxic crisis or storm: Secondary | ICD-10-CM | POA: Diagnosis not present

## 2020-07-19 DIAGNOSIS — E0590 Thyrotoxicosis, unspecified without thyrotoxic crisis or storm: Secondary | ICD-10-CM

## 2020-07-19 MED ORDER — OXYCODONE HCL 5 MG PO TABS
5.0000 mg | ORAL_TABLET | ORAL | Status: DC | PRN
  Administered 2020-08-02 – 2020-08-10 (×6): 5 mg via ORAL
  Filled 2020-07-19 (×10): qty 1

## 2020-07-19 MED ORDER — TRAMADOL HCL 50 MG PO TABS
50.0000 mg | ORAL_TABLET | Freq: Four times a day (QID) | ORAL | Status: DC | PRN
  Administered 2020-07-19 – 2020-08-09 (×27): 50 mg via ORAL
  Filled 2020-07-19 (×28): qty 1
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
## 2020-07-19 NOTE — Progress Notes (Signed)
PROGRESS NOTE   Subjective/Complaints:  Pt ate 100% tray; Good BM on toilet after dig stim/suppository.   Pain in knees limiting therapy.  Thinks he's supposed to stop prednisone today? Will check with Endo Also has questions about getting a priest to hear confession.  Also asking when needs to be cathed and nursing were asking.    ROS:  Pt denies SOB, abd pain, CP, N/V/C/D, and vision changes      Objective:   No results found. Recent Labs    07/18/20 0615  WBC 6.1  HGB 12.0*  HCT 36.2*  PLT 202   Recent Labs    07/18/20 0615  NA 137  K 4.5  CL 103  CO2 30  GLUCOSE 97  BUN 19  CREATININE 0.73  CALCIUM 8.7*    Intake/Output Summary (Last 24 hours) at 07/19/2020 0921 Last data filed at 07/19/2020 0730 Gross per 24 hour  Intake 1080 ml  Output 3095 ml  Net -2015 ml     Pressure Injury 06/28/20 Thigh Left;Posterior;Proximal Stage 3 -  Full thickness tissue loss. Subcutaneous fat may be visible but bone, tendon or muscle are NOT exposed. (Active)  06/28/20 1600 (Assessed at office prior to admission)  Location: Thigh  Location Orientation: Left;Posterior;Proximal  Staging: Stage 3 -  Full thickness tissue loss. Subcutaneous fat may be visible but bone, tendon or muscle are NOT exposed.  Wound Description (Comments):   Present on Admission: Yes     Pressure Injury 07/05/20 Coccyx Mid Stage 1 -  Intact skin with non-blanchable redness of a localized area usually over a bony prominence. (Active)  07/05/20 1540  Location: Coccyx  Location Orientation: Mid  Staging: Stage 1 -  Intact skin with non-blanchable redness of a localized area usually over a bony prominence.  Wound Description (Comments):   Present on Admission: Yes    Physical Exam: Vital Signs Blood pressure 94/67, pulse 97, temperature 97.9 F (36.6 C), resp. rate 18, height 5\' 10"  (1.778 m), weight 74.8 kg, SpO2 99 %.     General:  awake, alert, appropriate, sitting up in bed; ate 100% tray;  NAD HENT: conjugate gaze; oropharynx moist CV: regular rate; no JVD Pulmonary: CTA B/L; no W/R/R- good air movement GI: soft, NT, ND, (+)BS Psychiatric: appropriate; asking for confession Neurological: alert;  MAS of 1; no spasms with simple ROM Skin: intact- buttock wounds as above Musculoskeletal:  R knee- painful- a lot of crepitus- TTP and pain with ROM General: No swelling. 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 Sensation: RUE C4 ok- decreased from C5 to S1 on R LUE- C5 OK- decreased from C6 to S1 on L   Assessment/Plan: 1. Functional deficits which require 3+ hours per day of interdisciplinary therapy in a comprehensive inpatient rehab setting.  Physiatrist is providing close team supervision and 24 hour management of active medical problems listed below.  Physiatrist and rehab team continue to assess barriers to discharge/monitor patient progress toward functional and medical goals  Care Tool:  Bathing  Bathing activity did not occur: Refused Body parts bathed by patient: Right arm,Left arm,Chest,Abdomen,Front perineal area,Right  upper leg,Left upper leg,Face   Body parts bathed by helper: Buttocks,Right lower leg,Left lower leg     Bathing assist Assist Level: Moderate Assistance - Patient 50 - 74%     Upper Body Dressing/Undressing Upper body dressing   What is the patient wearing?: Pull over shirt    Upper body assist Assist Level: Moderate Assistance - Patient 50 - 74%    Lower Body Dressing/Undressing Lower body dressing      What is the patient wearing?: Pants     Lower body assist Assist for lower body dressing: Maximal Assistance - Patient 25 - 49%     Toileting Toileting    Toileting assist Assist for toileting: Dependent - Patient 0%     Transfers Chair/bed transfer  Transfers assist     Chair/bed transfer assist level: Minimal Assistance - Patient > 75% (slide board)     Locomotion Ambulation   Ambulation assist   Ambulation activity did not occur: Safety/medical concerns  Assist level: Dependent - Patient 0% Assistive device: Lite Gait Max distance: 100'   Walk 10 feet activity   Assist  Walk 10 feet activity did not occur: Safety/medical concerns  Assist level: Dependent - Patient 0% Assistive device: Lite Gait   Walk 50 feet activity   Assist Walk 50 feet with 2 turns activity did not occur: Safety/medical concerns  Assist level: Dependent - Patient 0% Assistive device: Lite Gait    Walk 150 feet activity   Assist Walk 150 feet activity did not occur: Safety/medical concerns         Walk 10 feet on uneven surface  activity   Assist Walk 10 feet on uneven surfaces activity did not occur: Safety/medical concerns         Wheelchair     Assist Will patient use wheelchair at discharge?: Yes Type of Wheelchair: Manual    Wheelchair assist level: Supervision/Verbal cueing Max wheelchair distance: 150'    Wheelchair 50 feet with 2 turns activity    Assist        Assist Level: Supervision/Verbal cueing   Wheelchair 150 feet  activity     Assist      Assist Level: Supervision/Verbal cueing   Blood pressure 94/67, pulse 97, temperature 97.9 F (36.6 C), resp. rate 18, height 5\' 10"  (1.778 m), weight 74.8 kg, SpO2 99 %.  Medical Problem List and Plan: 1.C4 Tetraplegia -ASIA Csecondary tocervical myelopathy/cervical synovial cyst. Status post C3-4 laminectomy with posterior arthrodesis cervical instrumentation 06/29/2020. Cervical brace as directed. -patient may Shower with neck brace/incision covered -ELOS/Goals: ~3 weeks- min A -will order PRAFOs and have pt wear nightly to prevent ankle contractures  5/26- con't PT and OT- spent 20 minutes  going over ASIA level, and info about SCI including neurogenic bowel and bladder, spasticity and increased risk of DVT/pressure ulcers.   --con't PT and OT    2. Antithrombotics: -DVT/anticoagulation:Lovenox. Vascular study reviewed and negative.  5/26- will need for at least 2 months- maybe 3 months total from surgery. 6/3- will need for a total of 3 months  3. Pain Management:Baclofen 5 mg 3 times daily, oxycodone and Flexeril as needed  5/26- pain pretty well controlled- con't regimen  5/28-   con't regimen prn  5/31- will do R shoulder injection today.  6/3- Shoulder injection 5/31- R shoulder pain almost gone with injection   6/7- Added tramadol- got rid of 10 mg Oxy-  4. Mood:Provide emotional support  5/26- pt has been tearful understandably- will try Celexa 10 mg daily  5/31- tolerating, will increase to 20 mg daily  6/6- happy with improving results, so brighter- con't regimen -antipsychotic agents: N/A 5. Neuropsych: This patientiscapable of making decisions on hisown behalf. 6. Skin/Wound Care:Routine skin checks 7. Fluids/Electrolytes/Nutrition:Routine in and outs with follow-up chemistries 8. Atrial fibrillation. Lopressor 25 mg twice daily. Cardiac rate controlled  5/28 overall controlled- con't  regimen  5/31- will decrease Metoprolol to 12.5 mg daily (was qday) since heart rate on low side and BP low- don't want him orthostatic due to SCI.   6/1- HR 54 this AM- they are holding Metoprolol- will con't to monitor  6/2- HR 71 this AM- con't regimen  9. Hypertension with orthostatic hypotension. Cozaar 50 mg daily. Monitor with increased mobility  5/28 elevated today.  5/30- BP 90s/50s- will monitor for orthostatic hypotension  5/31- will decrease metoprolol as above  6/1- HR still low and BP is 90s/60s- con't regimen for now. Might need midodrine per PT, drops with therapy, but not dizzy.   6/6- will ask again due to BP 86/54 this AM- might need midodrine? 10. BPH/UTI.Was onFlomax 0.4 mg daily.UTI greater than 80,000 Staphylococcus/Enterococcus completing course of Bactrim- please see neurogenic bladder- 11. Surgical PCR screening positive. Bactroban as directed 12. Hyperthyroidism- had lost 70 lbs in 6 months- in middle of treatment  6/1- per Endo, want labs checked before appointment 6/7- will order thyroid labs for tomorrow, so they are back for appointment.   6/2- TSH <0.010 and free T4 1.03- free T3 pending.   6/3- pt to have endo phone appt 6/9- asked him to let therapy know to schedule therapy around it.   6/7- has appt today per pt- will let me know if there's any changes! 13. Fatty liver-will need to be careful with any medicine going through liver. Placed nursing order to request that protein shake recommended by his hepatologist be given at night  6/7- explained to pt that tylenol can affect liver, not the other way around.  14. Neurogenic bladder- will start on I/o caths q6 hours prn and increase Flomax to 0.8 mg with supper  5/25 no side effects with flomax- con't regimen  5/26- went over with pt, will need in/out caths or Foley until he's able to emtpy- I cannot guarantee he will get complete function back.   5/30- voided a little yesterday- con't Flomax and  in/out caths  5/31- sounds like overflow, not real voiding- will verify with nursing they are cathing q6 hours. No prn- because he's having overflow incontinence.  6/1- pt will start trying to learn in/out cathing as of tomorrow- order placed   6/2- d/w nursing and OT, needs to learn how to cath. Can  try different catheters?  6/6- voiding some, but wife also taught on cathing as well. con't regimen  6/7- will change order to cath if volumes >250cc 15. Neurogenic bowel- doesn't have good control- will start with suppository- and dig stim and see if can withdraw any of them over time.   6/2- Pt having good results with bowel program- still requiring some dig stim, but less.   6/5: add miralax mixed in prune juice to dinner.   6/6- BM on toilet with dig stim/supp- improving!- con't regimen 16. Spasticity- will increase Baclofen to 10 mg TID and con't Flexeril.  D/c'd flexeril  5/31- will d/w pt going up on baclofen cannot do zanaflex due to low BP and cannot do dantrolene due to liver issues.  6/5- Spasms/spasticity controlled per pt- don't change dosing, continue current regimen 17. Orthostatic hypotension  5/26- added TEDs and Abd binder during day.  5/31- decreased metoprolol  18. Ileus  5/29 -pt much improved today   -likely d/t neuro, meds, etc   -continue clear liquid diet   -continue IV reglan    -primary team can adjust bowel program tomorrow   -recheck KUB in AM for resolution   5/30- will restart regular diet- ileus resolved- also will increase senokot a little- reason got ileus is was refusing bowel program.  19. R shoulder pain  5/30- will try to do R shoulder injection tomorrow 5/31- R shoulder injections for subacromial and RTC/posterior shoulder injection today  6/2-6/5 pain MUCH better- continue regimen  5/31-received steroid injection.  20. Nausea: resolved: changed valium and reglan to PRN 21. R>L knee pain  6/7- added tramadol 50 mg q6 hours prn and stopped  Oxycodone 10 mg- take oxy only if severe pain; try Tramadol, not tlyenol when hurting BEFORE therapy.       LOS: 14 days A FACE TO FACE EVALUATION WAS PERFORMED  Gayathri Futrell 07/19/2020, 9:21 AM

## 2020-07-19 NOTE — Progress Notes (Signed)
Occupational Therapy Weekly Progress Note  Patient Details  Name: Darren Harrison MRN: [id number] Date of Birth: Feb 05, 1956  Beginning of progress report period: Jul 12, 2020 End of progress report period: July 19, 2020  Patient has met 3 of 3 short term goals.  Pt is making steady progress with BADLs, bed mobility, sit<>stand in [place], and functional use of BUE. Pt rolls R/L in bed using bed rails with min A. Supine>sit EOB with min A using bed rails and pushing up from sidelying. LB bathing/dressing at bed level with max A. UB bathing with min A and UB dressing with mod A for pullover shirt. Sit<>stand in [place] fluctuates from min A to max A+2 depending on fatigue level. Static sitting balance with supervision. Dynamic sitting balance EOM with min A/mod A. Rt shoulder with increased AROM. Self feeding with min A.  Patient continues to demonstrate the following deficits: muscle weakness, decreased cardiorespiratoy endurance, impaired timing and sequencing, abnormal tone, unbalanced muscle activation, decreased coordination and decreased motor planning and decreased sitting balance, decreased standing balance, decreased postural control and decreased balance strategies and therefore will continue to benefit from skilled OT intervention to enhance overall performance with BADL and Reduce care partner burden.  Patient progressing toward long term goals..  Continue plan of care.  OT Short Term Goals Week 2:  OT Short Term Goal 1 (Week 2): Pt will perform self feeding with Min A with AE PRN OT Short Term Goal 1 - Progress (Week 2): Met OT Short Term Goal 2 (Week 2): Pt will improve trunk control/sitting balance to sit EOB/EOM for 5 mins with no more than Min A OT Short Term Goal 2 - Progress (Week 2): Met OT Short Term Goal 3 (Week 2): Pt will complete sit <> stands in [place] with Max of 1 OT Short Term Goal 3 - Progress (Week 2): Met Week 3:  OT Short Term Goal 1 (Week 3): Pt will perform UB dressing  with min A seated in w/c OT Short Term Goal 2 (Week 3): Pt will complete LB dressing with mod A at bed level OT Short Term Goal 3 (Week 3): Pt will perform SB transfers with min A OT Short Term Goal 4 (Week 3): Pt will complete sit<>stand in [place] with min A     [name] 07/19/2020, 7:03 AM

## 2020-07-19 NOTE — Progress Notes (Signed)
Patient finished eating at 1830. Digital stim done at 1830. No stool present in chamber. Suppository introduced at [place]. As of end of shift not bowel movement present. Next shift to continue. Sanda Linger, LPN

## 2020-07-19 NOTE — Progress Notes (Signed)
Physical Therapy Session Note  Patient Details  Name: Darren Harrison MRN: [id number] Date of Birth: 07-30-1955  Today's Date: 07/19/2020 PT Individual Time: 1030-1100; 3875-6433 PT Individual Time Calculation (min): 30 min and 70 min  Short Term Goals: Week 2:  PT Short Term Goal 1 (Week 2): Pt will perform bed mobility with mod A consistently PT Short Term Goal 2 (Week 2): Pt will perform least restrictive transfer with assist x 1 PT Short Term Goal 3 (Week 2): Pt will maintain standing with LRAD x 5 min PT Short Term Goal 4 (Week 2): Pt will maintain sitting balance x 5 min with min A  Skilled Therapeutic Interventions/Progress Updates:    Session 1: Pt received seated in w/c in room, agreeable to PT session. No complaints of pain this date. Manual w/c propulsion 2 x 150 ft with use of BUE at Supervision level. Pt requires assist to lock brakes and for leg rest management. Sit to stand with +2 to stedy (min A +2 initially, increases to max A +2 with onset of fatigue). Session focus on standing in stedy with use of mirror for visual feedback. Attempt to perform mini-squats in stedy, pt unable to complete due to lack of control once knees unlocked. Standing lateral weight shifts L/R while standing in stedy. Pt left seated in w/c in room with needs in reach at end of session.  Session 2: Pt received seated in w/c in room, agreeable to PT session. No complaints of pain at rest, has some knee pain with mobility. Pt able to receive pain medication during session. Applied ACE wraps to B knees during session for improved support. Manual w/c propulsion x 150 ft with use of BUE at Supervision level. Session focus on standing with use of standing frame. Pt able to perform 4 x 10 reps of mini-squats in standing frame with focus on use of LE>UE. Manual w/c propulsion x 150 ft with use of B UE and LE at Supervision level. Slide board transfer back to bed with mod A due to fatigue. Sit to supine mod A needed  for BLE management. Pt left seated in bed with needs in reach, bed alarm in place, wife present.  Therapy Documentation Precautions:  Precautions Precautions: Cervical,Fall Precaution Comments: reviewed precautions Required Braces or Orthoses: Cervical Brace Cervical Brace: Hard collar,Other (comment) (donned in sitting) Restrictions Weight Bearing Restrictions: No   Therapy/Group: Individual Therapy   [name], PT, DPT, CSRS  07/19/2020, 11:08 AM

## 2020-07-19 NOTE — Patient Care Conference (Signed)
Inpatient RehabilitationTeam Conference and Plan of Care Update Date: 07/19/2020   Time: 11:06 AM    Patient Name: Darren Harrison      Medical Record Number: [id number]  Date of Birth: [birth year] Sex: Male         Room/Bed: 4M10C/4M10C-01 Payor Info: Payor: [employer] / Plan: CIGNA MANAGED / Product Type: *No Product type* /    Admit Date/Time:  07/05/2020  3:07 PM  Primary Diagnosis:  Cervical myelopathy [facility])  Hospital Problems: Principal Problem:   Cervical myelopathy ([place]) Active Problems:   Pressure injury of skin   Neurogenic bowel   Neurogenic bladder   Spasticity    Expected Discharge Date: Expected Discharge Date: 08/03/20  Team Members Present: Physician leading conference: Dr. Courtney Heys Care Coodinator Present: Loralee Pacas, LCSWA;Philamena Kramar Creig Hines, RN, BSN, CRRN Nurse Present: Dorthula Nettles, RN PT Present: [name], PT OT Present: Roanna Epley, COTA;Jennifer Tamala Julian, OT PPS Coordinator present : Ileana Ladd, PT     Current Status/Progress Goal Weekly Team Focus  Bowel/Bladder   incontinent sometimes. daily bowel program, i&o cath Q6 LBM 6/6  become fully continent and able to move bowels on his own  assess Q shift   Swallow/Nutrition/ Hydration             ADL's   LB bathing/dressing at bed level-max A; UB bathing at shower level-min A; UB dressing seated in w/c-mod A; dynamic sitting balance with min A; increased functional use of BUE  min A overall  sitting balance, bed mobility, BADL retraining, tranfsers, education   Mobility   min to mod A bed mobility, min to mod A SB transfer vs +2 with stedy, Supervision w/c mobility, dependent gait with LiteGait  Supervision bed mobility, min A transfers, mod I w/c mobility  LE NMR, standing as able, increasing independence with transfers, SCI edu   Communication             Safety/Cognition/ Behavioral Observations            Pain   no pain  remain pain free  assess Q shift and medictate   Skin   skin  intact  skin will remain intact  asses skin Q shift     Discharge Planning:  D/c to home with intermittent support from wife who will visit during the her lunch break; works 15 minutes away. Pt will need to be Mod I or intermittent support at discharge.   Team Discussion: BP 80"s/50"s yesterday, pain is limiting in knees. Cath orders have been changed, need to know if he is symptomatic with orthostatic hypotension? Incontinent B/B, wife has I&O cathed patient, she has not performed bowel program as of yet. Need to set up family conference. Patient on target to meet rehab goals: yes, mod assist for bed mobility, sitting up is contact guard to min assist, sitting balance is min assist, +2 to stand with lift equipment. Gait not progressing. Bathing and dressing min/mod assist in shower with lateral leans. Upper body dressing min assist, focus on upper body strengthening, and pressure relief.   *See Care Plan and progress notes for long and short-term goals.   Revisions to Treatment Plan:  If bladder scan's > 250cc, I&O cath patient.  Teaching Needs: Family education, medication management, pain management, skin/wound care, bowel and bladder training, transfer training, balance training, endurance training, safety awareness.  Current Barriers to Discharge: Decreased caregiver support, Medical stability, Home enviroment access/layout, Incontinence, Neurogenic bowel and bladder, Lack of/limited family support, Weight bearing restrictions,  Medication compliance and Behavior  Possible Resolutions to Barriers: Continue current medications, provide emotional support.     Medical Summary Current Status: i/o caths- is peeing some, but not emptying; still requiring bowel program -but having BMs on toilet with dig stim/suppo; pain limiting therapy in knees- wife has cathed pt with coude' catheter.  Barriers to Discharge: Decreased family/caregiver support;Home enviroment access/layout;Neurogenic Bowel &  Bladder;Medical stability;Weight bearing restrictions;Wound care  Barriers to Discharge Comments: will need 24/7 care at d/c; no help to help him- she's at work; ?SNF? core control better- min A SB transfer now- using manual w/c; standing not real functional right now; family conference Thursday? can be by phone- Possible Resolutions to [employer] Focus: change i/o caths to cath if scan >250cc; adding tramadol 50 mg QID prn; might be able to stop prednisone; at w/c level; needs w/c appt? with [facility]; BP real low 14C-45'E systolic; no orthostasis per therapy; working on pressure relief; d/c 6/22?   Continued Need for Acute Rehabilitation Level of Care: The patient requires daily medical management by a physician with specialized training in physical medicine and rehabilitation for the following reasons: Direction of a multidisciplinary physical rehabilitation program to maximize functional independence : Yes Medical management of patient stability for increased activity during participation in an intensive rehabilitation regime.: Yes Analysis of laboratory values and/or radiology reports with any subsequent need for medication adjustment and/or medical intervention. : Yes   I attest that I was present, lead the team conference, and concur with the assessment and plan of the team.   Cristi Loron 07/19/2020, 4:38 PM

## 2020-07-19 NOTE — Progress Notes (Signed)
Occupational Therapy Session Note  Patient Details  Name: THADEUS GANDOLFI MRN: [id number] Date of Birth: [birth year]  Today's Date: 07/19/2020 OT Individual Time: 4270-6237 OT Individual Time Calculation (min): 70 min    Short Term Goals: Week 3:  OT Short Term Goal 1 (Week 3): Pt will perform UB dressing with min A seated in w/c OT Short Term Goal 2 (Week 3): Pt will complete LB dressing with mod A at bed level OT Short Term Goal 3 (Week 3): Pt will perform SB transfers with min A OT Short Term Goal 4 (Week 3): Pt will complete sit<>stand in [place] with min A  Skilled Therapeutic Interventions/Progress Updates:    OT intervention with focus on bed mobility, sit<>stand in [place], SB transfers, bathing at shower level, dressing at bed level and seated EOB for UB dressing. Supine>sit EOB with supervision pushing up from sidelying. Sit<>stand in [place] with mod A+2. Bathing at shower level with mod A. Pt able to initiate washing hair but required assistance to complete task. Pt sat EOB with supervision to don pull over shirt. Mod A for donning shirt.SB transfer to w/c with min A. Pt remained in std w/c with all needs within reach.    Therapy Documentation Precautions:  Precautions Precautions: Cervical,Fall Precaution Comments: reviewed precautions Required Braces or Orthoses: Cervical Brace Cervical Brace: Hard collar,Other (comment) (donned in sitting) Restrictions Weight Bearing Restrictions: No   Pain: Pt c/o increased knee pain after therapy yesterday; repsoitioned and warm shower    Therapy/Group: Individual Therapy  [name] 07/19/2020, 10:34 AM

## 2020-07-19 NOTE — Plan of Care (Signed)
  Problem: Consults Goal: RH SPINAL CORD INJURY PATIENT EDUCATION Description:  See Patient Education module for education specifics.  Outcome: Progressing   Problem: SCI BOWEL ELIMINATION Goal: RH STG MANAGE BOWEL WITH ASSISTANCE Description: STG Manage Bowel with  min Assistance. Outcome: Progressing Goal: RH STG SCI MANAGE BOWEL PROGRAM W/ASSIST OR AS APPROPRIATE Description: STG SCI Manage bowel program w/ min assist or as appropriate. Outcome: Progressing   Problem: SCI BLADDER ELIMINATION Goal: RH STG MANAGE BLADDER WITH ASSISTANCE Description: STG Manage Bladder With  min Assistance Outcome: Progressing Goal: RH STG MANAGE BLADDER WITH MEDICATION WITH ASSISTANCE Description: STG Manage Bladder With Medication With  mod I Assistance. Outcome: Progressing   Problem: RH SKIN INTEGRITY Goal: RH STG SKIN FREE OF INFECTION/BREAKDOWN Description: No new skin issues and healing chronic issues with min assist Outcome: Progressing Goal: RH STG MAINTAIN SKIN INTEGRITY WITH ASSISTANCE Description: STG Maintain Skin Integrity With  min Assistance. Outcome: Progressing Goal: RH STG ABLE TO PERFORM INCISION/WOUND CARE W/ASSISTANCE Description: STG Able To Perform Incision/Wound Care With  min Assistance. Outcome: Progressing   Problem: RH SAFETY Goal: RH STG ADHERE TO SAFETY PRECAUTIONS W/ASSISTANCE/DEVICE Description: STG Adhere to Safety Precautions With cues/reminders Assistance/Device. Outcome: Progressing   Problem: RH PAIN MANAGEMENT Goal: RH STG PAIN MANAGED AT OR BELOW PT'S PAIN GOAL Description: At or below level 4 Outcome: Progressing   Problem: RH KNOWLEDGE DEFICIT SCI Goal: RH STG INCREASE KNOWLEDGE OF SELF CARE AFTER SCI Description: Patient will be able to direct care and wife will be able to manage care at discharge using equipment handouts and educational materials independently Outcome: Progressing

## 2020-07-19 NOTE — Progress Notes (Signed)
Pt wants to discuss medication.

## 2020-07-19 NOTE — Progress Notes (Signed)
Patient ID: Darren Harrison, male   DOB: 1955-03-13, 65 y.o.   MRN: [id number]  SW met with pt in room to provide updates from team conference, and d/c date remains 6/22. SW discussed with pt family meeting this Thursday (6/9) 10am or 11am, and [facility] preference. Reports he will speak with his wife about her schedule and see if she is able to be present. SW stressed that she does not have to come in person, this can be a phone conference with physician and therapists. He will also f/u with SW about [facility] preference.   [id number] left message for pt wife Silva Bandy [phone]) to discuss family conference, and SW requested return phone call.   Loralee Pacas, MSW, [place] Office: [phone] Cell: [phone] Fax: [phone]

## 2020-07-20 NOTE — Progress Notes (Signed)
Physical Therapy Weekly Progress Note  Patient Details  Name: Darren Harrison MRN: [id number] Date of Birth: 03/01/1955  Beginning of progress report period: July 13, 2020 End of progress report period: July 20, 2020  Today's Date: 07/20/2020  Patient has met 4 of 4 short term goals.  Pt is making good progress towards therapy goals. Pt is currently mod A for bed mobility with use of bedrail, most assist required for BLE management from sit to supine. Pt has been able to perform slide board transfers with min to mod A to level surfaces, a significant improvement from total A x 2 due to improved core control. Pt has been able to initiate w/c mobility in the past week and is Supervision for mobility up to 150 ft with use of BUE. Pt is currently unable to manage w/c parts to setup chair for transfers or pressure relief due to fine motor UE deficits. Pt continues to require assist x 2 for standing with dependent equipment including the stedy, standing frame, and Lite Gait. Pt has been able to initiate gait training with use of Lite Gait but ambulation remains non-functional at this point due to significant assist required from the Lite Gait sling to unweight LE in order for the patient to be able to take steps. Patient will complete a seating evaluation for a custom ultra lightweight manual wheelchair next week in order to set him up with the most appropriate equipment to utilized upon d/c home.  Patient continues to demonstrate the following deficits muscle weakness and muscle joint tightness, decreased cardiorespiratoy endurance, abnormal tone and unbalanced muscle activation, and decreased sitting balance, decreased standing balance, decreased postural control, and decreased balance strategies and therefore will continue to benefit from skilled PT intervention to increase functional independence with mobility.  Patient progressing toward long term goals..  Continue plan of care.  PT Short Term Goals Week  2:  PT Short Term Goal 1 (Week 2): Pt will perform bed mobility with mod A consistently PT Short Term Goal 1 - Progress (Week 2): Met PT Short Term Goal 2 (Week 2): Pt will perform least restrictive transfer with assist x 1 PT Short Term Goal 2 - Progress (Week 2): Met PT Short Term Goal 3 (Week 2): Pt will maintain standing with LRAD x 5 min PT Short Term Goal 3 - Progress (Week 2): Met PT Short Term Goal 4 (Week 2): Pt will maintain sitting balance x 5 min with min A PT Short Term Goal 4 - Progress (Week 2): Met Week 3:  PT Short Term Goal 1 (Week 3): Pt will perform bed mobility with min A consistently PT Short Term Goal 2 (Week 3): Pt will perform least restrictive transfer with min A consistently PT Short Term Goal 3 (Week 3): Pt will initiate w/c mobility in ultra lightweight wheelchair PT Short Term Goal 4 (Week 3): Pt will maintain sitting balance x 5 min with Supervision  Therapy Documentation Precautions:  Precautions Precautions: Cervical,Fall Precaution Comments: reviewed precautions Required Braces or Orthoses: Cervical Brace Cervical Brace: Hard collar,Other (comment) (donned in sitting) Restrictions Weight Bearing Restrictions: No   Therapy/Group: Individual Therapy   Taylor Turkalo, PT, DPT, CSRS  07/20/2020, 5:42 PM  

## 2020-07-20 NOTE — Progress Notes (Signed)
Occupational Therapy Session Note  Patient Details  Name: Darren Harrison MRN: [id number] Date of Birth: [birth year]  Today's Date: 07/20/2020 OT Individual Time: 7414-2395 OT Individual Time Calculation (min): 57 min    Short Term Goals: Week 2:  OT Short Term Goal 1 (Week 2): Pt will perform self feeding with Min A with AE PRN OT Short Term Goal 1 - Progress (Week 2): Met OT Short Term Goal 2 (Week 2): Pt will improve trunk control/sitting balance to sit EOB/EOM for 5 mins with no more than Min A OT Short Term Goal 2 - Progress (Week 2): Met OT Short Term Goal 3 (Week 2): Pt will complete sit <> stands in [place] with Max of 1 OT Short Term Goal 3 - Progress (Week 2): Met Week 3:  OT Short Term Goal 1 (Week 3): Pt will perform UB dressing with min A seated in w/c OT Short Term Goal 2 (Week 3): Pt will complete LB dressing with mod A at bed level OT Short Term Goal 3 (Week 3): Pt will perform SB transfers with min A OT Short Term Goal 4 (Week 3): Pt will complete sit<>stand in Stedy with min A  Skilled Therapeutic Interventions/Progress Updates:    Pt greeted at time of session supine in bed resting agreeable to OT session, no pain. Donned TEDS dependent bed level, MD entered at this time for extensive discussion/education with pt regarding cathing, potential placement short term, and need for assist at home. Reiterated this throughout session on importance of pressure relief, need for assist at home, cathing, risk of UTIs, etc. Donned shorts med level Mod/Max with pt bringing BLEs up to self to help thread, attempting bridge but needed to roll with Min A for roll to assist donning over hips. Supine > sit CGA with HOB slightly elevated. Total A for slide board placement and slide board bed > wheelchair CGA/Min A. Self propel > sink for oral hygiene Min A  Then room <> gym with close supervision only Min A for backing into small spaces. Clips 1 set of red/yellow for GMC/FMC training with tripod  pinch to improve ability to cath self. Grip strength remeasured, 36# in L and 24# in R. Pt set up in room with alarm on call bell in reach.   Therapy Documentation Precautions:  Precautions Precautions: Cervical,Fall Precaution Comments: reviewed precautions Required Braces or Orthoses: Cervical Brace Cervical Brace: Hard collar,Other (comment) (donned in sitting) Restrictions Weight Bearing Restrictions: No      Therapy/Group: Individual Therapy  Viona Gilmore 07/20/2020, 7:10 AM

## 2020-07-20 NOTE — Progress Notes (Signed)
Physical Therapy Session Note  Patient Details  Name: Darren Harrison MRN: [id number] Date of Birth: September 11, 1955  Today's Date: 07/20/2020 PT Individual Time: 1030-1100; 1300-1400 PT Individual Time Calculation (min): 30 min and 60 min  Short Term Goals: Week 2:  PT Short Term Goal 1 (Week 2): Pt will perform bed mobility with mod A consistently PT Short Term Goal 2 (Week 2): Pt will perform least restrictive transfer with assist x 1 PT Short Term Goal 3 (Week 2): Pt will maintain standing with LRAD x 5 min PT Short Term Goal 4 (Week 2): Pt will maintain sitting balance x 5 min with min A  Skilled Therapeutic Interventions/Progress Updates:    Session 1: Pt received seated in w/c handed off from nursing following brief change. Assisted pt with donning new knee high TEDs and shoes. Sit to stand with max A +2 to stedy. Assisted pt with pulling pants up over hips. Sit to stand from perched stedy seat with mod A to max A +2 during remainder of session. Pt exhibits increased fatigue and increased assist needed to perform standing this date. Pt also reports some R shoulder pain following standing prior to this session. Not rated and declines intervention at this time. Session focus on standing posture and maintaining midline as pt tends to shift weight to the L while in stedy. Pt left seated in w/c in room with needs in reach at end of session.  Session 2: Pt received seated in bed, agreeable to PT session. Pt reports R shoulder pain ongoing from AM session, provided ice pack at end of session for pain management. Assisted pt with donning B knee sleeves and shoes at bed level dependently. Supine to sit with CGA with increased time and use of bedrail. Slide board transfer bed to/from w/c with mod A throughout session. Manual w/c propulsion to/from therapy gym at Supervision level. Pt continues to require assist with management of w/c parts due to decreased dexterity and strength in his hands. Session focus  on sitting balance EOM, close Supervision to min A times. Pt performs seated 3# dowel rod bicep curls 2 x 20 reps, anterior/posterior leans 2 x 15 reps, seated ball toss, ball kick, and then ball toss/kick combo. Pt exhibits improved sitting balance and core control with minimal assist needed to maintain balance in unsupported position this date. Pt left semi-reclined in bed with needs in reach, ice pack to R shoulder at end of session.  Therapy Documentation Precautions:  Precautions Precautions: Cervical,Fall Precaution Comments: reviewed precautions Required Braces or Orthoses: Cervical Brace Cervical Brace: Hard collar,Other (comment) (donned in sitting) Restrictions Weight Bearing Restrictions: No   Therapy/Group: Individual Therapy   [name], PT, DPT, CSRS  07/20/2020, 5:42 PM

## 2020-07-20 NOTE — Progress Notes (Signed)
Occupational Therapy Session Note  Patient Details  Name: Darren Harrison MRN: [id number] Date of Birth: February 09, 1956  Today's Date: 07/20/2020 OT Individual Time: 1600-1650 OT Individual Time Calculation (min): 50 min    Short Term Goals: Week 3:  OT Short Term Goal 1 (Week 3): Pt will perform UB dressing with min A seated in w/c OT Short Term Goal 2 (Week 3): Pt will complete LB dressing with mod A at bed level OT Short Term Goal 3 (Week 3): Pt will perform SB transfers with min A OT Short Term Goal 4 (Week 3): Pt will complete sit<>stand in Stedy with min A  Skilled Therapeutic Interventions/Progress Updates:    Pt received in bed with social worker present and consented to OT tx. Pt instructed in bed mobility to sit EOB with min A. Activities during session completed sitting EOB with SUP to increase core strength and unsupported sitting balance. [facility] and intrinsic hand strengthening activities with tan theraputty HEP and opening and closing ADL containers. Pt required cuing to utilize R hand, which demo's increased [place] deficits as compared to R hand. Pt c/o 4/10 pain in R shoulder, reports he just got some pain medication from the nurse. Following activities modified to avoid excess R shoulder movement due to pt's c/o pain. Pt then instructed in large pegs/pegboard activity with picture reference for matching task. Instructed pt to alternate BUEs during task to increase B [facility] and strength for ADLs. Pt required increased time to complete pegboard tasks, however is able to complete with 100% accuracy with time. After tx, pt helped to lay back down in bed, req mod A to scoot to [facility], and was left with bed alarm on and with all needs met.   Therapy Documentation Precautions:  Precautions Precautions: Cervical,Fall Precaution Comments: reviewed precautions Required Braces or Orthoses: Cervical Brace Cervical Brace: Hard collar,Other (comment) (donned in  sitting) Restrictions Weight Bearing Restrictions: No Vital Signs: Therapy Vitals Temp: 98.1 F (36.7 C) Temp Source: Oral Pulse Rate: 63 Resp: 19 BP: 98/72 Patient Position (if appropriate): Lying Oxygen Therapy SpO2: 97 % O2 Device: Room Air Pain: Pain Assessment Pain Scale: 0-10 Pain Score: 5  Pain Type: Acute pain Pain Location: Shoulder Pain Orientation: Right Pain Descriptors / Indicators: Aching Pain Frequency: Constant Pain Onset: On-going Pain Intervention(s): Medication (See eMAR);Cold applied   Therapy/Group: Individual Therapy  Adanna Zuckerman 07/20/2020, 5:15 PM

## 2020-07-20 NOTE — Progress Notes (Addendum)
Pt didn't receive meal tray this pm and was ordered at 1730. Called [facility] and responded that was "put in the zone"? Unsure when tray will arrive but informed that need it as soon as possible.   1900- pt dinner tray still has not arrived. Pt did have a small BM today and has had a lot of gas. Suppository administered; no stool in vault noted. Will notify next shift of dig stem needed.

## 2020-07-20 NOTE — Progress Notes (Signed)
PROGRESS NOTE   Subjective/Complaints:   Pt reports wife doesn't like to have "big discussions" esp with strangers- explained if she understands our conversation today- that he relay to her, I'm fine with not family conference.   Feels like has gloves on- but willing to try and learn to cath- will place order.  Went over my concerns that the plan is for pt to get to min A by d/c- and if so, he will need 24/7 assist at d/c, not intermittent assist and it's unsafe to go home like planned.   Spent a long time discussing this with OT in room.  Also pt said BP is too low- will d/c Losartan and con't metoprolol-  Also allowed to stop prednisone per endo.     ROS:   Pt denies SOB, abd pain, CP, N/V/C/D, and vision changes       Objective:   No results found. Recent Labs    07/18/20 0615  WBC 6.1  HGB 12.0*  HCT 36.2*  PLT 202   Recent Labs    07/18/20 0615  NA 137  K 4.5  CL 103  CO2 30  GLUCOSE 97  BUN 19  CREATININE 0.73  CALCIUM 8.7*    Intake/Output Summary (Last 24 hours) at 07/20/2020 0902 Last data filed at 07/20/2020 0700 Gross per 24 hour  Intake 716 ml  Output 2050 ml  Net -1334 ml     Pressure Injury 06/28/20 Thigh Left;Posterior;Proximal Stage 3 -  Full thickness tissue loss. Subcutaneous fat may be visible but bone, tendon or muscle are NOT exposed. (Active)  06/28/20 1600 (Assessed at office prior to admission)  Location: Thigh  Location Orientation: Left;Posterior;Proximal  Staging: Stage 3 -  Full thickness tissue loss. Subcutaneous fat may be visible but bone, tendon or muscle are NOT exposed.  Wound Description (Comments):   Present on Admission: Yes     Pressure Injury 07/05/20 Coccyx Mid Stage 1 -  Intact skin with non-blanchable redness of a localized area usually over a bony prominence. (Active)  07/05/20 1540  Location: Coccyx  Location Orientation: Mid  Staging: Stage 1 -   Intact skin with non-blanchable redness of a localized area usually over a bony prominence.  Wound Description (Comments):   Present on Admission: Yes    Physical Exam: Vital Signs Blood pressure 102/75, pulse (!) 54, temperature 97.7 F (36.5 C), temperature source Oral, resp. rate 18, height 5\' 10"  (2.694 m), weight 74.8 kg, SpO2 99 %.      General: awake, alert, appropriate, sitting up in bed; OT in room;  NAD HENT: conjugate gaze; oropharynx moist CV: regular rate; no JVD Pulmonary: CTA B/L; no W/R/R- good air movement GI: soft, NT, ND, (+)BS Psychiatric: appropriate; interactive Neurological: Ox3; MAS of 1 to 1+ in LEs B/L  Musculoskeletal:  R knee- painful- a lot of crepitus- TTP and pain with ROM General: No swelling. 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 Sensation: RUE C4 ok- decreased from C5 to S1 on R LUE- C5 OK- decreased from C6 to S1 on L   Assessment/Plan: 1. Functional deficits which require 3+ hours per day of interdisciplinary therapy in a comprehensive inpatient rehab setting.  Physiatrist is providing close team supervision and 24 hour management of active medical problems  listed below.  Physiatrist and rehab team continue to assess barriers to discharge/monitor patient progress toward functional and medical goals  Care Tool:  Bathing  Bathing activity did not occur: Refused Body parts bathed by patient: Right arm,Left arm,Chest,Abdomen,Front perineal area,Right upper leg,Left upper leg,Face   Body parts bathed by helper: Buttocks,Right lower leg,Left lower leg     Bathing assist Assist Level: Moderate Assistance - Patient 50 - 74%     Upper Body Dressing/Undressing Upper body dressing   What is the patient wearing?: Pull over shirt,Orthosis    Upper body assist Assist Level: Moderate Assistance - Patient 50 - 74%    Lower Body Dressing/Undressing Lower body dressing      What is the patient wearing?: Pants     Lower body assist Assist for lower body dressing: Maximal Assistance - Patient 25 - 49%     Toileting Toileting    Toileting assist Assist for toileting: Dependent - Patient 0%     Transfers Chair/bed transfer  Transfers assist     Chair/bed transfer assist level: Minimal Assistance - Patient > 75% (slide board)     Locomotion Ambulation   Ambulation assist   Ambulation activity did not occur: Safety/medical concerns  Assist level: Dependent - Patient 0% Assistive device: Lite Gait Max distance: 100'   Walk 10 feet activity   Assist  Walk 10 feet activity did not occur: Safety/medical concerns  Assist level: Dependent - Patient 0% Assistive device: Lite Gait   Walk 50 feet activity   Assist Walk 50 feet with 2 turns activity did not occur: Safety/medical concerns  Assist level: Dependent - Patient 0% Assistive device: Lite Gait    Walk 150 feet activity   Assist Walk 150 feet activity did not occur: Safety/medical concerns         Walk 10 feet on uneven surface  activity   Assist Walk 10 feet on uneven surfaces activity did not occur: Safety/medical concerns          Wheelchair     Assist Will patient use wheelchair at discharge?: Yes Type of Wheelchair: Manual    Wheelchair assist level: Supervision/Verbal cueing Max wheelchair distance: 150'    Wheelchair 50 feet with 2 turns activity    Assist        Assist Level: Supervision/Verbal cueing   Wheelchair 150 feet activity     Assist      Assist Level: Supervision/Verbal cueing   Blood pressure 102/75, pulse (!) 54, temperature 97.7 F (36.5 C), temperature source Oral, resp.  rate 18, height 5\' 10"  (1.778 m), weight 74.8 kg, SpO2 99 %.  Medical Problem List and Plan: 1.C4 Tetraplegia -ASIA Csecondary tocervical myelopathy/cervical synovial cyst. Status post C3-4 laminectomy with posterior arthrodesis cervical instrumentation 06/29/2020. Cervical brace as directed. -patient may Shower with neck brace/incision covered -ELOS/Goals: ~3 weeks- min A -will order PRAFOs and have pt wear nightly to prevent ankle contractures  5/26- con't PT and OT- spent 20 minutes going over ASIA level, and info about SCI including neurogenic bowel and bladder, spasticity and increased risk of DVT/pressure ulcers.   --con't PT and OT- discussed at length with pt goal is Min A- if so, will need 24/7 A at home- I don't think he'll get to Mod I (explained this)- made agreement if pt does, will reward with singing! But think this is unlikely- I would think about short term SNF-  2. Antithrombotics: -DVT/anticoagulation:Lovenox. Vascular study reviewed and negative.  5/26- will need for at least 2 months- maybe 3 months total from surgery. 6/3- will need for a total of 3 months  3. Pain Management:Baclofen 5 mg 3 times daily, oxycodone and Flexeril as needed  5/26- pain pretty well controlled- con't regimen  5/28-   con't regimen prn  5/31- will do R shoulder injection today.  6/3- Shoulder injection 5/31- R shoulder pain almost gone with injection   6/7- Added  tramadol- got rid of 10 mg Oxy-   6/8- pain slightly better- but has to ask for tramadol- con't regimen 4. Mood:Provide emotional support  5/26- pt has been tearful understandably- will try Celexa 10 mg daily  5/31- tolerating, will increase to 20 mg daily  6/6- happy with improving results, so brighter- con't regimen -antipsychotic agents: N/A 5. Neuropsych: This patientiscapable of making decisions on hisown behalf. 6. Skin/Wound Care:Routine skin checks 7. Fluids/Electrolytes/Nutrition:Routine in and outs with follow-up chemistries 8. Atrial fibrillation. Lopressor 25 mg twice daily. Cardiac rate controlled  5/28 overall controlled- con't regimen  5/31- will decrease Metoprolol to 12.5 mg daily (was qday) since heart rate on low side and BP low- don't want him orthostatic due to SCI.   6/1- HR 54 this AM- they are holding Metoprolol- will con't to monitor  6/2- HR 71 this AM- con't regimen  9. Hypertension with orthostatic hypotension. Cozaar 50 mg daily. Monitor with increased mobility  5/28 elevated today.  5/30- BP 90s/50s- will monitor for orthostatic hypotension  5/31- will decrease metoprolol as above  6/1- HR still low and BP is 90s/60s- con't regimen for now. Might need midodrine per PT, drops with therapy, but not dizzy.   6/6- will ask again due to BP 86/54 this AM- might need midodrine?  6/8- per pt , no dizziness, however BP low- will stop Losartan and con't metoprolol since for rate control.  10. BPH/UTI.Was onFlomax 0.4 mg daily.UTI greater than 80,000 Staphylococcus/Enterococcus completing course of Bactrim- please see neurogenic bladder- 11. Surgical PCR screening positive. Bactroban as directed 12. Hyperthyroidism- had lost 70 lbs in 6 months- in middle of treatment  6/1- per Endo, want labs checked before appointment 6/7- will order thyroid labs for tomorrow, so they are back for appointment.   6/2- TSH <0.010 and free T4 1.03- free T3  pending.   6/3- pt to have endo phone appt 6/9- asked him to let therapy know to schedule therapy around it.   6/7- has appt today per pt- will let me know if there's any changes!  6/8- allowed to stop Prednisone and will wait on  Synthroid for today.  13. Fatty liver-will need to be careful with any medicine going through liver. Placed nursing order to request that protein shake recommended by his hepatologist be given at night  6/7- explained to pt that tylenol can affect liver, not the other way around.  14. Neurogenic bladder- will start on I/o caths q6 hours prn and increase Flomax to 0.8 mg with supper  5/25 no side effects with flomax- con't regimen  5/26- went over with pt, will need in/out caths or Foley until he's able to emtpy- I cannot guarantee he will get complete function back.   5/30- voided a little yesterday- con't Flomax and in/out caths  5/31- sounds like overflow, not real voiding- will verify with nursing they are cathing q6 hours. No prn- because he's having overflow incontinence.  6/1- pt will start trying to learn in/out cathing as of tomorrow- order placed   6/2- d/w nursing and OT, needs to learn how to cath. Can try different catheters?  6/6- voiding some, but wife also taught on cathing as well. con't regimen  6/7- will change order to cath if volumes >250cc 15. Neurogenic bowel- doesn't have good control- will start with suppository- and dig stim and see if can withdraw any of them over time.   6/2- Pt having good results with bowel program- still requiring some dig stim, but less.   6/5: add miralax mixed in prune juice to dinner.   6/6- BM on toilet with dig stim/supp- improving!- con't regimen 16. Spasticity- will increase Baclofen to 10 mg TID and con't Flexeril.  D/c'd flexeril  5/31- will d/w pt going up on baclofen cannot do zanaflex due to low BP and cannot do dantrolene due to liver issues.  6/5- Spasms/spasticity controlled per pt- don't change dosing,  continue current regimen 17. Orthostatic hypotension  5/26- added TEDs and Abd binder during day.  5/31- decreased metoprolol   6/8- will stop Losartan 18. Ileus  5/29 -pt much improved today   -likely d/t neuro, meds, etc   -continue clear liquid diet   -continue IV reglan    -primary team can adjust bowel program tomorrow   -recheck KUB in AM for resolution   5/30- will restart regular diet- ileus resolved- also will increase senokot a little- reason got ileus is was refusing bowel program.  19. R shoulder pain  5/30- will try to do R shoulder injection tomorrow 5/31- R shoulder injections for subacromial and RTC/posterior shoulder injection today  6/2-6/5 pain MUCH better- continue regimen  5/31-received steroid injection.  20. Nausea: resolved: changed valium and reglan to PRN 21. R>L knee pain  6/7- added tramadol 50 mg q6 hours prn and stopped Oxycodone 10 mg- take oxy only if severe pain; try Tramadol, not tlyenol when hurting BEFORE therapy. 72. Dispo  6/8- spent a long time going over that we, as a team, think he would benefit from more time at SNF- for 1-2 months, not long term- OT offered to speak with pt about this.    I spent a total of 35 minutes on total care today- >50% coordination of care- as detailed above- on discussing about dispo.        LOS: 15 days A FACE TO FACE EVALUATION WAS PERFORMED  Avonne Berkery 07/20/2020, 9:02 AM

## 2020-07-20 NOTE — Progress Notes (Signed)
Patient ID: Darren Harrison, male   DOB: 24-Jul-1955, 65 y.o.   MRN: [id number]  SW spoke with [name] [phone]) to discuss family meeting. Wife reports she was aware patient has stated he will be going to a SNF. SW informed will confirm, and will follow-up about meeting since she is only available at 1pm tomorrow and now the plan of care is changing.   *SW met with pt in room to follow-up about SNF placement. Pt denies he has made a decision. SW discussed the benefits of SNF due to safety since his wife is not able to provide 24/7 care due to her work schedule. States he will speak with attending tomorrow to get more clarification. Patient understands that SW will support him in the decision he makes.   SW called [name] to provide her on above updates, and indicated will follow-up once there is more information.   SW faxed demo sheet to [name] for w/c eval on Monday (6/10).  Loralee Pacas, MSW, [place] Office: [phone] Cell: [phone] Fax: [phone]

## 2020-07-21 NOTE — Progress Notes (Signed)
Was notified by day shift nurse that this patient only had a slight bowel movement for the day and was instructed to finish his bowel program. I went in this patients room to start my portion of his bowel program and the patient stated that he had just had a bowel movement and he did not feel as though me completing the bowel program was necessary. I educated this patient on the benefits of completing the bowel program and the importance of completing the bowel program since he is a spinal injury patient, and he still stated that the bowel program was "unnecessary at this time and not needed". Will continue to monitor patient for signs and symptoms related to AD.

## 2020-07-21 NOTE — Progress Notes (Signed)
Patient ID: Darren Harrison, male   DOB: [birth year], 65 y.o.   MRN: [id number]  SW faxed FMLA forms to [facility] fax#[phone]. SW provided pt with original forms.   Loralee Pacas, MSW, [place] Office: [phone] Cell: [phone] Fax: [phone]

## 2020-07-21 NOTE — Progress Notes (Signed)
PROGRESS NOTE   Subjective/Complaints:   Pt reports he doesn't know why needs to go SNF- and says we should give them an evaluation- I explained that I was giving him an evaluation- that based on his level of function- and think that he will not get to level he can care for self with intermittent supervision/help.   However pt emphatic he will "get good enough". Had BM on toilet last night after supp and dig stim; and also accident this AM.  Explained that's it due to lack of enough dig stim.    ROS:   Pt denies SOB, abd pain, CP, N/V/C/D, and vision changes      Objective:   No results found. No results for input(s): WBC, HGB, HCT, PLT in the last 72 hours.  No results for input(s): NA, K, CL, CO2, GLUCOSE, BUN, CREATININE, CALCIUM in the last 72 hours.   Intake/Output Summary (Last 24 hours) at 07/21/2020 0909 Last data filed at 07/21/2020 0630 Gross per 24 hour  Intake 236 ml  Output 1950 ml  Net -1714 ml     Pressure Injury 06/28/20 Thigh Left;Posterior;Proximal Stage 3 -  Full thickness tissue loss. Subcutaneous fat may be visible but bone, tendon or muscle are NOT exposed. (Active)  06/28/20 1600 (Assessed at office prior to admission)  Location: Thigh  Location Orientation: Left;Posterior;Proximal  Staging: Stage 3 -  Full thickness tissue loss. Subcutaneous fat may be visible but bone, tendon or muscle are NOT exposed.  Wound Description (Comments):   Present on Admission: Yes     Pressure Injury 07/05/20 Coccyx Mid Stage 1 -  Intact skin with non-blanchable redness of a localized area usually over a bony prominence. (Active)  07/05/20 1540  Location: Coccyx  Location Orientation: Mid  Staging: Stage 1 -  Intact skin with non-blanchable redness of a localized area usually over a bony prominence.  Wound Description (Comments):   Present on Admission: Yes    Physical Exam: Vital Signs Blood pressure  110/84, pulse (!) 49, temperature 98 F (36.7 C), temperature source Oral, resp. rate 18, height 5\' 10"  (1.778 m), weight 74.8 kg, SpO2 98 %.       General: awake, alert, appropriate, sitting up in bed;  NAD HENT: conjugate gaze; oropharynx moist CV: bradycardic rate; no JVD Pulmonary: CTA B/L; no W/R/R- good air movement GI: soft, NT, ND, (+)BS- had accident this AM Psychiatric: appropriate Neurological: alert Musculoskeletal:  R knee- painful- a lot of crepitus- TTP and pain with ROM       General: No swelling. 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 Sensation: RUE C4 ok- decreased from C5 to S1 on R LUE- C5 OK- decreased from C6 to S1 on L    Assessment/Plan: 1. Functional deficits which require 3+ hours per day of interdisciplinary therapy in a comprehensive inpatient rehab setting. Physiatrist is providing close team supervision and 24 hour management of active medical problems listed below. Physiatrist and rehab team continue to assess barriers to discharge/monitor patient progress toward functional and medical goals  Care Tool:  Bathing  Bathing activity did not occur: Refused Body parts bathed by  patient: Right arm, Left arm, Chest, Abdomen, Front perineal area, Right upper leg, Left upper leg, Face   Body parts bathed by helper: Buttocks, Right lower leg, Left lower leg     Bathing assist Assist Level: Moderate Assistance - Patient 50 - 74%     Upper Body Dressing/Undressing Upper body dressing   What is the patient wearing?: Pull over shirt, Orthosis    Upper body assist Assist Level: Moderate Assistance - Patient 50 - 74%    Lower Body Dressing/Undressing Lower body dressing      What is the patient wearing?: Pants     Lower body assist Assist for lower body dressing: Maximal Assistance - Patient 25 - 49%     Toileting Toileting    Toileting assist Assist for toileting: Dependent - Patient 0%     Transfers Chair/bed transfer  Transfers assist     Chair/bed transfer assist level: Moderate Assistance - Patient 50 - 74% (slide board)     Locomotion Ambulation   Ambulation assist   Ambulation activity did not occur: Safety/medical concerns  Assist level: Dependent - Patient 0% Assistive device: Lite Gait Max distance: 100'   Walk 10 feet activity   Assist  Walk 10 feet activity did not occur: Safety/medical concerns  Assist level: Dependent - Patient 0% Assistive device: Lite Gait   Walk 50 feet activity   Assist Walk 50 feet with 2 turns activity did not occur: Safety/medical concerns  Assist level: Dependent - Patient 0% Assistive device: Lite Gait    Walk 150 feet activity   Assist Walk 150 feet activity did not occur: Safety/medical concerns         Walk 10 feet on uneven surface  activity   Assist Walk 10 feet on uneven surfaces activity did not occur: Safety/medical concerns         Wheelchair     Assist Will patient use wheelchair at discharge?: Yes Type of Wheelchair: Manual    Wheelchair assist level: Supervision/Verbal cueing Max wheelchair distance: 150'    Wheelchair 50 feet with 2 turns  activity    Assist        Assist Level: Supervision/Verbal cueing   Wheelchair 150 feet activity     Assist      Assist Level: Supervision/Verbal cueing   Blood pressure 110/84, pulse (!) 49, temperature 98 F (36.7 C), temperature source Oral, resp. rate 18, height 5\' 10"  (1.778 m), weight 74.8 kg, SpO2 98 %.  Medical Problem List and Plan: 1. 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  Status post C3-4 laminectomy with posterior arthrodesis cervical instrumentation 06/29/2020.  Cervical brace as directed.             -patient may  Shower with neck brace/incision covered             -ELOS/Goals: ~3 weeks- min A -will order PRAFOs and have pt wear nightly to prevent ankle contractures  5/26- con't PT and OT- spent 20 minutes going over ASIA level, and info about SCI including neurogenic bowel and bladder, spasticity and increased risk of DVT/pressure ulcers.   --con't PT and OT- discussed at length with pt goal is Min A- if so, will need 24/7 A at home- I don't think he'll get to Mod I (explained this)- made agreement if pt does, will reward with singing! But think this is unlikely- I would think about short term SNF-   Con't PT and OT- pt's goal is higher than therapist- explained I truly think SNF is best option, but pt refuses "so far". 2.  Antithrombotics: -DVT/anticoagulation: Lovenox.  Vascular study reviewed and negative.  5/26- will need for at least 2 months- maybe 3 months total from surgery. 6/3- will need for a total of 3 months  3. Pain Management: Baclofen 5 mg 3 times daily, oxycodone and Flexeril as needed  5/26- pain pretty well controlled- con't regimen  5/28-   con't regimen prn  5/31- will do R shoulder injection today.  6/3- Shoulder injection 5/31- R shoulder pain almost gone with injection   6/7- Added tramadol- got rid of 10 mg Oxy-   6/8- pain slightly better- but has to ask for tramadol- con't regimen 4. Mood:  Provide emotional support  5/26- pt has been tearful understandably- will try Celexa 10 mg daily  5/31- tolerating, will increase to 20 mg daily  6/6- happy with improving results, so brighter- con't regimen             -antipsychotic agents: N/A 5. Neuropsych: This patient is capable of making decisions on his own behalf. 6. Skin/Wound Care: Routine skin checks 7. Fluids/Electrolytes/Nutrition: Routine in and outs with follow-up chemistries 8.  Atrial fibrillation.  Lopressor 25 mg twice daily.  Cardiac rate controlled  5/28 overall controlled- con't regimen  5/31- will decrease Metoprolol to 12.5 mg daily (was qday) since heart rate on low side and BP low- don't want him orthostatic due to SCI.   6/1- HR 54 this AM- they are holding Metoprolol- will con't to monitor  6/2- HR 71 this AM- con't regimen   6/9- HR 49 this AM- holding metoprolol.  9.  Hypertension with orthostatic hypotension.  Cozaar 50 mg daily.  Monitor with increased mobility  5/28 elevated today.  5/30- BP 90s/50s- will monitor for orthostatic hypotension  5/31- will decrease metoprolol as above  6/1- HR still low and BP is 90s/60s- con't regimen for now. Might need midodrine per PT, drops with therapy, but not dizzy.   6/6- will ask again due to BP 86/54 this AM- might need midodrine?  6/8- per pt , no dizziness, however BP low- will stop Losartan and con't metoprolol since for rate control.   6/9- pt BP much better this AM- con't regimen off Losartan.  10.  BPH/UTI.  Was on Flomax 0.4 mg daily.   UTI greater than 80,000 Staphylococcus/Enterococcus completing course of Bactrim- please see neurogenic bladder-  11.  Surgical PCR screening positive.  Bactroban as directed 12. Hyperthyroidism- had lost 70 lbs in 6 months- in middle of treatment  6/1- per Endo, want labs checked before appointment 6/7-  will order thyroid labs for tomorrow, so they are back for appointment.   6/2- TSH <0.010 and free T4 1.03- free T3 pending.    6/3- pt to have endo phone appt 6/9- asked him to let therapy know to schedule therapy around it.   6/7- has appt today per pt- will let me know if there's any changes!  6/8- allowed to stop Prednisone and will wait on Synthroid for today.  13. Fatty liver-will need to be careful with any medicine going through liver. Placed nursing order to request that protein shake recommended by his hepatologist be given at night  6/7- explained to pt that tylenol can affect liver, not the other way around.  14. Neurogenic bladder- will start on I/o caths q6 hours prn and increase Flomax to 0.8 mg with supper  5/25 no side effects with flomax- con't regimen  5/26- went over with pt, will need in/out caths or Foley until he's able to emtpy- I cannot guarantee he will get complete function back.   5/30- voided a little yesterday- con't Flomax and in/out caths  5/31- sounds like overflow, not real voiding- will verify with nursing they are cathing q6 hours. No prn- because he's having overflow incontinence.  6/1- pt will start trying to learn in/out cathing as of tomorrow- order placed   6/2- d/w nursing and OT, needs to learn how to cath. Can try different catheters?  6/6- voiding some, but wife also taught on cathing as well. con't regimen  6/7- will change order to cath if volumes >250cc  6/9- still requiring caths- con't regimen 15.  Neurogenic bowel- doesn't have good control- will start with suppository- and dig stim and see if can withdraw any of them over time.   6/2- Pt having good results with bowel program- still requiring some dig stim, but less.   6/5: add miralax mixed in prune juice to dinner.   6/6- BM on toilet with dig stim/supp- improving!- con't regimen  6/9- BM on toilet, but having accidents- needs more dig stim.  16. Spasticity- will increase Baclofen to 10 mg TID and con't Flexeril.   D/c'd flexeril  5/31- will d/w pt going up on baclofen cannot do zanaflex due to low BP and cannot  do dantrolene due to liver issues.  6/5- Spasms/spasticity controlled per pt- don't change dosing, continue current regimen  17. Orthostatic hypotension  5/26- added TEDs and Abd binder during day.  5/31- decreased metoprolol   6/8- will stop Losartan 18. Ileus  5/29 -pt much improved today   -likely d/t neuro, meds, etc   -continue clear liquid diet   -continue IV reglan    -primary team can adjust bowel program tomorrow   -recheck KUB in AM for resolution   5/30- will restart regular diet- ileus resolved- also will increase senokot a little- reason got ileus is was refusing bowel program.  19. R shoulder pain  5/30- will try to do R shoulder injection tomorrow 5/31- R shoulder injections for subacromial and RTC/posterior shoulder injection today  6/2-6/5 pain MUCH better- continue regimen  5/31-received steroid injection.  20. Nausea: resolved: changed valium and reglan to PRN 21. R>L knee pain  6/7- added tramadol 50 mg q6 hours prn and stopped Oxycodone 10 mg- take oxy only if severe pain; try Tramadol, not tlyenol when hurting BEFORE therapy. 50. Dispo  6/8- spent a long time going over that we, as a team, think he would benefit from more time at SNF- for 1-2  months, not long term- OT offered to speak with pt about this.    I spent a total of 35 minutes on visit- >50% on coordination of care- discussing dispo and pt doesn't want family conference.      LOS: 16 days A FACE TO FACE EVALUATION WAS PERFORMED  Darren Harrison 07/21/2020, 9:09 AM

## 2020-07-21 NOTE — Progress Notes (Signed)
Bowel program: Pt had no stool in vault; first dig stem completed without results, suppository administered. No results at this time.

## 2020-07-21 NOTE — Progress Notes (Signed)
Physical Therapy Session Note  Patient Details  Name: Darren Harrison MRN: [id number] Date of Birth: 12-Mar-1955  Today's Date: 07/21/2020 PT Individual Time: 1100-1200 PT Individual Time Calculation (min): 60 min   Short Term Goals: Week 1:  PT Short Term Goal 1 (Week 1): Pt will complete bed mobility with assist x 1 consistently PT Short Term Goal 1 - Progress (Week 1): Met PT Short Term Goal 2 (Week 1): Pt will initiate slide board transfers PT Short Term Goal 2 - Progress (Week 1): Met PT Short Term Goal 3 (Week 1): Pt will maintain sitting balance x 5 min with min A PT Short Term Goal 3 - Progress (Week 1): Progressing toward goal Week 2:  PT Short Term Goal 1 (Week 2): Pt will perform bed mobility with mod A consistently PT Short Term Goal 1 - Progress (Week 2): Met PT Short Term Goal 2 (Week 2): Pt will perform least restrictive transfer with assist x 1 PT Short Term Goal 2 - Progress (Week 2): Met PT Short Term Goal 3 (Week 2): Pt will maintain standing with LRAD x 5 min PT Short Term Goal 3 - Progress (Week 2): Met PT Short Term Goal 4 (Week 2): Pt will maintain sitting balance x 5 min with min A PT Short Term Goal 4 - Progress (Week 2): Met   Skilled Therapeutic Interventions/Progress Updates:    pt received in [facility]  and agreeable to therapy. Pt directed in [place] mobility for 200' SBA with BUE propulsion. Pt directed in standing frame x5 for full stands and with between standing, x4 each of 25% range stands with sling to maintain upright posture and then allow pt to come to full standing, tactile cues for glute and quad activation however pt heavily relied on BUE. Pt directed in static standing in fair posture 1 min each time. Pt directed in 2x15 toe taps on 4" step in seated position, 2x10 marching and LAQ in seated position. Pt directed in [place] mobility 100' supervision then fatigued and taken to dayroom for  kinetron at 30% resistance for 5 mins for improved BLE strengthening and  reciprocal mobility. Pt taken to room  total A for time pt left in [facility], All needs in reach and in good condition. Call light in hand.  And alarm belt set  Therapy Documentation Precautions:  Precautions Precautions: Cervical, Fall Precaution Comments: reviewed precautions Required Braces or Orthoses: Cervical Brace Cervical Brace: Hard collar, Other (comment) (donned in sitting) Restrictions Weight Bearing Restrictions: No General:   Vital Signs: Therapy Vitals Temp: 98.3 F (36.8 C) Pulse Rate: 82 Resp: 17 BP: 93/69 Patient Position (if appropriate): Lying Oxygen Therapy SpO2: 98 % O2 Device: Room Air Pain: Pain Assessment Pain Scale: 0-10 Pain Score: 3  Pain Type: Acute pain Pain Location: Shoulder Pain Orientation: Right Pain Descriptors / Indicators: Aching Pain Frequency: Constant Pain Onset: On-going Pain Intervention(s): Medication (See eMAR) Mobility:   Locomotion :    Trunk/Postural Assessment :    Balance:   Exercises:   Other Treatments:      Therapy/Group: Individual Therapy  Junie Panning 07/21/2020, 4:22 PM

## 2020-07-21 NOTE — Progress Notes (Signed)
Occupational Therapy Session Note  Patient Details  Name: Darren Harrison MRN: [id number] Date of Birth: 12/10/1955  Today's Date: 07/21/2020 OT Individual Time: 1445-1541 OT Individual Time Calculation (min): 56 min    Short Term Goals: Week 2:  OT Short Term Goal 1 (Week 2): Pt will perform self feeding with Min A with AE PRN OT Short Term Goal 1 - Progress (Week 2): Met OT Short Term Goal 2 (Week 2): Pt will improve trunk control/sitting balance to sit EOB/EOM for 5 mins with no more than Min A OT Short Term Goal 2 - Progress (Week 2): Met OT Short Term Goal 3 (Week 2): Pt will complete sit <> stands in Stedy with Max of 1 OT Short Term Goal 3 - Progress (Week 2): Met Week 3:  OT Short Term Goal 1 (Week 3): Pt will perform UB dressing with min A seated in w/c OT Short Term Goal 2 (Week 3): Pt will complete LB dressing with mod A at bed level OT Short Term Goal 3 (Week 3): Pt will perform SB transfers with min A OT Short Term Goal 4 (Week 3): Pt will complete sit<>stand in Stedy with min A    Skilled Therapeutic Interventions/Progress Updates:    Pt greeted at time of session supine in bed resting agreeable to OT session, mild pain in R shoulder and treatments modified throughout with rest breaks. Supine > sit CGA with extended time and bed features, slide board bed > wheelchair CGA with pt determined to set up slide board, able to lateral lean on bed surface to place board under buttocks and scoot to w/c. Self propel > gym Supervision with extended time, focus of session on sit <> stands and knee control in stedy, 2+ assist at times for control and safety. Slide board wheelchair <> mat Min/CGA. Sit <> stands in Stedy Mod-Max increasing A with fatigue. In standing, 2x10 with shoulder flexion and chest press holding lightweight ball for core control. Stedy > wheelchair and self propel back to room same manner. Slide board > bed Min A d/t fatigue. Assist doffing shoes and TEDS before sit >  supine Min/Mod. Alarm on call bell in reach.   Therapy Documentation Precautions:  Precautions Precautions: Cervical, Fall Precaution Comments: reviewed precautions Required Braces or Orthoses: Cervical Brace Cervical Brace: Hard collar, Other (comment) (donned in sitting) Restrictions Weight Bearing Restrictions: No      Therapy/Group: Individual Therapy  Hannah C Spach 07/21/2020, 3:45 PM 

## 2020-07-21 NOTE — Progress Notes (Signed)
Provided hand out on cathing at home.  Will discuss later today and have pt attempt this evening.

## 2020-07-21 NOTE — Progress Notes (Signed)
Occupational Therapy Session Note  Patient Details  Name: DEMONTREZ RINDFLEISCH MRN: [id number] Date of Birth: 1955-06-02  Today's Date: 07/21/2020 OT Individual Time: 2549-8264 OT Individual Time Calculation (min): 72 min    Short Term Goals: Week 3:  OT Short Term Goal 1 (Week 3): Pt will perform UB dressing with min A seated in w/c OT Short Term Goal 2 (Week 3): Pt will complete LB dressing with mod A at bed level OT Short Term Goal 3 (Week 3): Pt will perform SB transfers with min A OT Short Term Goal 4 (Week 3): Pt will complete sit<>stand in Stedy with min A  Skilled Therapeutic Interventions/Progress Updates:    Pt resting in bed upon arrival. LB dressing at bed level with pt bridging in bed to facilitate pulling pants over hips. Supine>sit (bed flat) with supervision using bed rails. SB transfer to w/c with CGA. Pt transitioned to gym and completed SB tranfser to EOM with CGA. Pt performed half crunches from wedge and performing chest passes to tech. Pt also maintained sitting balance when tapping ball to tech. Pt performed lateral leans to RUE and LUE with supervisoin. Pt practiced placing and removing SB on R and L. Pt performed SB transfer to w/c with supervision, including placing and removing SB. Pt returned to room and remained in w/c with all needs within reach.   Therapy Documentation Precautions:  Precautions Precautions: Cervical, Fall Precaution Comments: reviewed precautions Required Braces or Orthoses: Cervical Brace Cervical Brace: Hard collar, Other (comment) (donned in sitting) Restrictions Weight Bearing Restrictions: No  Pain: Pain Assessment Pain Scale: 0-10 Pain Score: 4  Pain Location: Shoulder premedicated   Therapy/Group: Individual Therapy  [name] 07/21/2020, 9:33 AM

## 2020-07-22 ENCOUNTER — Inpatient Hospital Stay (HOSPITAL_COMMUNITY): Payer: Managed Care, Other (non HMO)

## 2020-07-22 MED ORDER — POLYETHYLENE GLYCOL 3350 17 G PO PACK
17.0000 g | PACK | Freq: Every day | ORAL | Status: DC
  Administered 2020-07-23 – 2020-08-10 (×17): 17 g via ORAL
  Filled 2020-07-22 (×19): qty 1

## 2020-07-22 MED ORDER — SENNA 8.6 MG PO TABS
1.0000 | ORAL_TABLET | Freq: Every day | ORAL | Status: DC
  Administered 2020-07-23 – 2020-08-10 (×19): 8.6 mg via ORAL
  Filled 2020-07-22 (×21): qty 1

## 2020-07-22 NOTE — Progress Notes (Signed)
Occupational Therapy Session Note  Patient Details  Name: Darren Harrison MRN: [id number] Date of Birth: [birth year]  Today's Date: 07/22/2020 Session 1 OT Individual Time: 3235-5732 OT Individual Time Calculation (min): 13 min   Session 2 OT Individual Time: 1420-1450 OT Individual Time Calculation (min): 30 min    Short Term Goals: Week 3:  OT Short Term Goal 1 (Week 3): Pt will perform UB dressing with min A seated in w/c OT Short Term Goal 2 (Week 3): Pt will complete LB dressing with mod A at bed level OT Short Term Goal 3 (Week 3): Pt will perform SB transfers with min A OT Short Term Goal 4 (Week 3): Pt will complete sit<>stand in Stedy with min A  Skilled Therapeutic Interventions/Progress Updates:   Session 1 Pt greeted semi-reclined in bed eating lunch. Pt reported he has been doing well with self-feeding using his L hand which has stronger grasp. OT educated on use of built up handles, but pt reports the built up handle did not stay well on utensils. OT pulled up built up handled utensils on [place] and educated pt on possibly purchasing these at [employer] to increase independence with self-feeding at home. Pt verbalized understanding. Pt demonstrated he could grasp utensil fairly well with L hand and get food to mouth. Pt left semi-reclined in bed finishing lunch with call bell in reach and needs met.   Session 2  Pt greeted seated in wc and reported he had been incontinent of urine in his brief. Discussed what pt would do at home in this situation. Pt reported he would likely transfer back to bed and complete peri-care from bed level with the assistance of his spouse. Pt able to place slideboard but needed OT assist to manage wc functions including breaks and leg rests. Pt then completed slideboard transfer over to bed with min A. Pt returned to supine and performed rolling with supervision. Pt worked on pulling pants down at the hips with multiple rolls. Pt noted to have also been  incontinent of BM. Total A for peri-care and donning new brief. Urine had also gotten on shirt and shorts. OT assist to thread pant legs, then worked on rolling L and R again to pull up pants, assistance to get pants all the way over hips. Pt came to sitting EOB with mod  A to elevate trunk. Mod A to doff soiled shirt. Pt was then able to thread  B UE's into clean shirt, but needed OT assist to pull shirt overhead. Pt returned to supine with mod A and left semi-reclined in bed with bed alarm on, call bell in reach and needs met.   Therapy Documentation Precautions:  Precautions Precautions: Cervical, Fall Precaution Comments: reviewed precautions Required Braces or Orthoses: Cervical Brace Cervical Brace: Hard collar, Other (comment) (donned in sitting) Restrictions Weight Bearing Restrictions: No  Pain:  Pt reports mild lower back pain. Rest and repositioned for comfort.     Therapy/Group: Individual Therapy  Valma Cava 07/22/2020, 2:59 PM

## 2020-07-22 NOTE — Progress Notes (Signed)
Occupational Therapy Session Note  Patient Details  Name: Darren Harrison MRN: [id number] Date of Birth: 1955/09/19  Today's Date: 07/22/2020 OT Individual Time: 0932-3557 OT Individual Time Calculation (min): 72 min    Short Term Goals: Week 3:  OT Short Term Goal 1 (Week 3): Pt will perform UB dressing with min A seated in w/c OT Short Term Goal 2 (Week 3): Pt will complete LB dressing with mod A at bed level OT Short Term Goal 3 (Week 3): Pt will perform SB transfers with min A OT Short Term Goal 4 (Week 3): Pt will complete sit<>stand in [place] with min A  Skilled Therapeutic Interventions/Progress Updates:    OT intervention with focus on bed mobility, sit<>stand, bathing at shower level, LB dressing at bed level, UB dressing seated EOB, BUE function, SB transfers, and activity tolerance to increase independence with BADLs. Supine>sit EOB with supervision using bed rails. Transfer to rolling shower chair with Stedy (sit<>stand with mod A+2). Bathing with min A this morning. Pt able to cross LE to wash further down on leg (almost to foot). Pt able to wash hair this morning. Return to bed to change pads in collar and don pants at bed level. Pt able to bridge adequately for therapist to pull pants over hips. Rolling R/L with supervision using bed rails. Pt donned pullover shirt with CGA this morning. SB transfer with min A this morning. Discussed clothing choices to facilitate transfers with SB. Pt remained in w/c with all needs within reach.   Therapy Documentation Precautions:  Precautions Precautions: Cervical, Fall Precaution Comments: reviewed precautions Required Braces or Orthoses: Cervical Brace Cervical Brace: Hard collar, Other (comment) (donned in sitting) Restrictions Weight Bearing Restrictions: No   Pain: Pt c/o Rt shoulder stiffness and increased pain (had not received pain meds yet); AAROM/PROM and warm shower   Therapy/Group: Individual Therapy  [name] 07/22/2020, 9:33 AM

## 2020-07-22 NOTE — Progress Notes (Signed)
Patient agreeable to bowel program. Wife at bedside and given instruction how to perform it. Wife not agreeable to do it at this time but willing to do it when pt goes home. Pt positioned on L lateral side. Digital stimulation performed with large soft stool come out. Perica done. Patient stated he has sensation on his anal area while RN doing procedure.  Wife shown how digital stimulation done. No question from wife at this time.

## 2020-07-22 NOTE — Progress Notes (Signed)
Occupational Therapy Session Note  Patient Details  Name: Darren Harrison MRN: [id number] Date of Birth: [birth year]  Today's Date: 07/22/2020 OT Individual Time: 1300-1415 OT Individual Time Calculation (min): 75 min    Short Term Goals: Week 1:  OT Short Term Goal 1 (Week 1): Pt will perform self feeding with Min A with AE PRN OT Short Term Goal 2 (Week 1): Pt will improve trunk control/sitting balance to sit EOB/EOM for 5 mins with no more than Min A OT Short Term Goal 3 (Week 1): Pt will complete sit <> stands in [place] with Max of 1  Skilled Therapeutic Interventions/Progress Updates:    Pt received in bed agreeable to OT with "mild" pain in shoulder. Pt provided with rest breaks and offered ice as intervention. Pt declined.   ADL:   Pt completes LB dressing with CGA for doffing pants using lateral leans. VC for lean strategy onto forearms. Pt requires MOD A to thread BLE into pants but needs MOD A to fully pull new shorts up past hips and rest breaks in between leans.  Pt completes footwear with elastic laces installed in shoes and demo of reacher/shoe funnel technique. Pt able to don shoes with CGA overall!   Therapeutic exercise Pt completes 2x3 w/c push ups with VC for anterior weight shift and pushing through BLE more than BUE.   Pt left at end of session in w/c with exit alarm on, call light in reach and all needs met   Therapy Documentation Precautions:  Precautions Precautions: Cervical, Fall Precaution Comments: reviewed precautions Required Braces or Orthoses: Cervical Brace Cervical Brace: Hard collar, Other (comment) (donned in sitting) Restrictions Weight Bearing Restrictions: No General:   Vital Signs: Therapy Vitals Temp: 98.4 F (36.9 C) Pulse Rate: 60 Resp: 18 BP: 91/72 Patient Position (if appropriate): Lying Oxygen Therapy SpO2: 99 % O2 Device: Room Air Pain:   ADL: ADL Eating: Not assessed Grooming: Not assessed Upper Body Bathing:  Maximal assistance Lower Body Bathing: Dependent Upper Body Dressing: Maximal assistance Lower Body Dressing: Dependent Toileting: Dependent Toilet Transfer:  (stedy +2) Vision   Perception    Praxis   Exercises:   Other Treatments:     Therapy/Group: Individual Therapy  Tonny Branch 07/22/2020, 6:53 AM

## 2020-07-22 NOTE — Progress Notes (Signed)
Bowel Program completed. Dig stem x2 performed on patient. Patient tolerated well. Still awaiting results.

## 2020-07-22 NOTE — Plan of Care (Signed)
  Problem: Consults Goal: RH SPINAL CORD INJURY PATIENT EDUCATION Description:  See Patient Education module for education specifics.  Outcome: Progressing   Problem: SCI BOWEL ELIMINATION Goal: RH STG MANAGE BOWEL WITH ASSISTANCE Description: STG Manage Bowel with  min Assistance. Outcome: Progressing Goal: RH STG SCI MANAGE BOWEL PROGRAM W/ASSIST OR AS APPROPRIATE Description: STG SCI Manage bowel program w/ min assist or as appropriate. Outcome: Progressing   Problem: SCI BLADDER ELIMINATION Goal: RH STG MANAGE BLADDER WITH ASSISTANCE Description: STG Manage Bladder With  min Assistance Outcome: Progressing Goal: RH STG MANAGE BLADDER WITH MEDICATION WITH ASSISTANCE Description: STG Manage Bladder With Medication With  mod I Assistance. Outcome: Progressing   Problem: RH SKIN INTEGRITY Goal: RH STG SKIN FREE OF INFECTION/BREAKDOWN Description: No new skin issues and healing chronic issues with min assist Outcome: Progressing Goal: RH STG MAINTAIN SKIN INTEGRITY WITH ASSISTANCE Description: STG Maintain Skin Integrity With  min Assistance. Outcome: Progressing Goal: RH STG ABLE TO PERFORM INCISION/WOUND CARE W/ASSISTANCE Description: STG Able To Perform Incision/Wound Care With  min Assistance. Outcome: Progressing   Problem: RH SAFETY Goal: RH STG ADHERE TO SAFETY PRECAUTIONS W/ASSISTANCE/DEVICE Description: STG Adhere to Safety Precautions With cues/reminders Assistance/Device. Outcome: Progressing   Problem: RH PAIN MANAGEMENT Goal: RH STG PAIN MANAGED AT OR BELOW PT'S PAIN GOAL Description: At or below level 4 Outcome: Progressing   Problem: RH KNOWLEDGE DEFICIT SCI Goal: RH STG INCREASE KNOWLEDGE OF SELF CARE AFTER SCI Description: Patient will be able to direct care and wife will be able to manage care at discharge using equipment handouts and educational materials independently Outcome: Progressing

## 2020-07-22 NOTE — Progress Notes (Signed)
Physical Therapy Session Note  Patient Details  Name: THOMSON HERBERS MRN: [id number] Date of Birth: 02/03/1956  Today's Date: 07/22/2020 PT Individual Time: 0935-1004 PT Individual Time Calculation (min): 29 min   Short Term Goals: Week 3:  PT Short Term Goal 1 (Week 3): Pt will perform bed mobility with min A consistently PT Short Term Goal 2 (Week 3): Pt will perform least restrictive transfer with min A consistently PT Short Term Goal 3 (Week 3): Pt will initiate w/c mobility in ultra lightweight wheelchair PT Short Term Goal 4 (Week 3): Pt will maintain sitting balance x 5 min with Supervision  Skilled Therapeutic Interventions/Progress Updates:    Patient in w/c finishing message on his phone for work and noted taking increased time for manual texting.  Patient reports has done his best standing in standing frame when discussing standing at sink.  Assisted to don knee sleeves bilaterally with total A.  Propelled in w/c to ortho gym with S and increased time.  Performed sit to stand in standing frame dependent lift, then lowered slightly and pt performed partial sit to stand with min to mod A to extend hips and knees and holding 10 seconds x 5 reps.  Lowered back to w/c.  Patient propelled partway to room, then assisted for time management.  Left in w/c with call bell and needs in reach.  Therapy Documentation Precautions:  Precautions Precautions: Cervical, Fall Precaution Comments: reviewed precautions Required Braces or Orthoses: Cervical Brace Cervical Brace: Hard collar, Other (comment) (donned in sitting) Restrictions Weight Bearing Restrictions: No  Pain: Pain Assessment Pain Score: 3  Pain Type: Acute pain Pain Location: Shoulder Pain Orientation: Right Pain Descriptors / Indicators: Aching Pain Onset: With Activity Pain Intervention(s): Distraction;Rest     Therapy/Group: Individual Therapy  Reginia Naas Magda Kiel, PT 07/22/2020, 4:58 PM

## 2020-07-22 NOTE — Progress Notes (Signed)
PROGRESS NOTE   Subjective/Complaints:  Pt reports small loose BM this AM even though had BM last night with bowel program.  Taking tramadol regularly- works well for pain- takes prior to therapy.  Did get more dig stim with bowel program last night.  First time didn't need cathing this AM when voided.    Found the pt's Miralax is in evening- will move to AM- also will reduce senokot to 1 tab/day.   ROS:   Pt denies SOB, abd pain, CP, N/V/C/D, and vision changes    Objective:   No results found. No results for input(s): WBC, HGB, HCT, PLT in the last 72 hours.  No results for input(s): NA, K, CL, CO2, GLUCOSE, BUN, CREATININE, CALCIUM in the last 72 hours.   Intake/Output Summary (Last 24 hours) at 07/22/2020 0811 Last data filed at 07/22/2020 0700 Gross per 24 hour  Intake 816 ml  Output 1825 ml  Net -1009 ml     Pressure Injury 06/28/20 Thigh Left;Posterior;Proximal Stage 3 -  Full thickness tissue loss. Subcutaneous fat may be visible but bone, tendon or muscle are NOT exposed. (Active)  06/28/20 1600 (Assessed at office prior to admission)  Location: Thigh  Location Orientation: Left;Posterior;Proximal  Staging: Stage 3 -  Full thickness tissue loss. Subcutaneous fat may be visible but bone, tendon or muscle are NOT exposed.  Wound Description (Comments):   Present on Admission: Yes     Pressure Injury 07/05/20 Coccyx Mid Stage 1 -  Intact skin with non-blanchable redness of a localized area usually over a bony prominence. (Active)  07/05/20 1540  Location: Coccyx  Location Orientation: Mid  Staging: Stage 1 -  Intact skin with non-blanchable redness of a localized area usually over a bony prominence.  Wound Description (Comments):   Present on Admission: Yes    Physical Exam: Vital Signs Blood pressure 91/72, pulse 60, temperature 98.4 F (36.9 C), resp. rate 18, height 5\' 10"  (1.778 m), weight 74.8 kg,  SpO2 99 %.        General: awake, alert, appropriate,  sitting up in bed; movements of arms- can tell is weak; NAD HENT: conjugate gaze; oropharynx moist CV: regular rate; no JVD Pulmonary: CTA B/L; no W/R/R- good air movement GI: soft, NT, ND, (+)BS Psychiatric: appropriate- bright affect Neurological: Ox3' MAS of 1+ in Le's- no significant tone in Ue's.   Musculoskeletal:  R knee- painful- a lot of crepitus- TTP and pain with ROM       General: TTP over R shoulder- over biceps tendon as well as R GH joint and AC joint.     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 Sensation: RUE C4 ok- decreased from C5 to S1 on R LUE- C5 OK- decreased from C6 to S1 on L    Assessment/Plan: 1. Functional deficits which require 3+ hours per day of interdisciplinary therapy in a comprehensive inpatient rehab setting. Physiatrist is providing close team supervision and 24 hour management of active medical problems listed below. Physiatrist and rehab team continue to assess barriers to discharge/monitor patient progress toward functional and medical goals  Care Tool:  Bathing  Bathing activity did not occur: Refused Body parts bathed by patient: Right arm, Left arm, Chest, Abdomen, Front perineal area, Right upper leg, Left upper leg, Face    Body parts bathed by helper: Buttocks, Right lower leg, Left lower leg     Bathing assist Assist Level: Moderate Assistance - Patient 50 - 74%     Upper Body Dressing/Undressing Upper body dressing   What is the patient wearing?: Pull over shirt, Orthosis    Upper body assist Assist Level: Moderate Assistance - Patient 50 - 74%    Lower Body Dressing/Undressing Lower body dressing      What is the patient wearing?: Pants     Lower body assist Assist for lower body dressing: Maximal Assistance - Patient 25 - 49%     Toileting Toileting    Toileting assist Assist for toileting: Dependent - Patient 0%     Transfers Chair/bed transfer  Transfers assist     Chair/bed transfer assist level: Moderate Assistance - Patient 50 - 74% (slide board)     Locomotion Ambulation   Ambulation assist   Ambulation activity did not occur: Safety/medical concerns  Assist level: Dependent - Patient 0% Assistive device: Lite Gait Max distance: 100'   Walk 10 feet activity   Assist  Walk 10 feet activity did not occur: Safety/medical concerns  Assist level: Dependent - Patient 0% Assistive device: Lite Gait   Walk 50 feet activity   Assist Walk 50 feet with 2 turns activity did not occur: Safety/medical concerns  Assist level: Dependent - Patient 0% Assistive device: Lite Gait    Walk 150 feet activity   Assist Walk 150 feet activity did not occur: Safety/medical concerns         Walk 10 feet on uneven surface  activity   Assist Walk 10 feet on uneven surfaces activity did not occur: Safety/medical concerns         Wheelchair     Assist Will patient use wheelchair at discharge?: Yes Type of Wheelchair: Manual    Wheelchair assist level: Supervision/Verbal cueing Max wheelchair distance: 150'    Wheelchair 50 feet with 2 turns activity    Assist        Assist Level: Supervision/Verbal cueing   Wheelchair 150 feet activity      Assist      Assist Level: Supervision/Verbal cueing   Blood pressure 91/72, pulse 60, temperature 98.4 F (36.9 C), resp. rate 18, height 5\' 10"  (1.778 m), weight 74.8 kg, SpO2 99 %.  Medical Problem List and Plan: 1. 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  Status post C3-4 laminectomy with posterior arthrodesis cervical instrumentation 06/29/2020.  Cervical brace as directed.             -patient may  Shower with neck brace/incision covered             -ELOS/Goals: ~3 weeks- min A -will order PRAFOs and have pt wear nightly to prevent ankle contractures  5/26- con't PT and OT- spent 20 minutes going over ASIA level, and info about SCI including neurogenic bowel and bladder, spasticity and increased risk of DVT/pressure ulcers.   --con't PT and OT- discussed at length with pt goal is Min A- if so, will need 24/7 A at home- I don't think he'll get to Mod I (explained this)- made agreement if pt does, will reward with singing! But think this is unlikely- I would think about short term SNF-   Con't PT and OT- pt willing IF need be, to go to SNF, but wants to see if can get good enough to go home- is progressing faster than expected as of yesterday- made some great gains- con't regimen 2.  Antithrombotics: -DVT/anticoagulation: Lovenox.  Vascular study reviewed and negative.  5/26- will need for at least 2 months- maybe 3 months total from surgery. 6/3- will need for a total of 3 months  3. Pain Management: Baclofen 5 mg 3 times daily, oxycodone and Flexeril as needed  5/26- pain pretty well controlled- con't regimen  5/28-   con't regimen prn  5/31- will do R shoulder injection today.  6/3- Shoulder injection 5/31- R shoulder pain almost gone with injection   6/7- Added tramadol- got rid of 10 mg Oxy-   6/10- taking tramadol- works for R >L knee, but shoulder now acting up again- will get xray and go from there- con't regimen 4. Mood: Provide  emotional support  5/26- pt has been tearful understandably- will try Celexa 10 mg daily  5/31- tolerating, will increase to 20 mg daily  6/6- happy with improving results, so brighter- con't regimen             -antipsychotic agents: N/A 5. Neuropsych: This patient is capable of making decisions on his own behalf. 6. Skin/Wound Care: Routine skin checks 7. Fluids/Electrolytes/Nutrition: Routine in and outs with follow-up chemistries 8.  Atrial fibrillation.  Lopressor 25 mg twice daily.  Cardiac rate controlled  5/28 overall controlled- con't regimen  5/31- will decrease Metoprolol to 12.5 mg daily (was qday) since heart rate on low side and BP low- don't want him orthostatic due to SCI.   6/10- HR 60 this AM- holding metoprolol more than giving. Likely worse due to SCI bradycardia- if doesn't improve, will call Cards next week.  9.  Hypertension with orthostatic hypotension.  Cozaar 50 mg daily.  Monitor with increased mobility  5/28 elevated today.  5/30- BP 90s/50s- will monitor for orthostatic hypotension  5/31- will decrease metoprolol as above  6/8- per pt , no dizziness, however BP low- will stop Losartan and con't metoprolol since for rate control.   6/10- BP soft again 90/s50s- off losartan- only on Metoprolol lowest dose- will call Cards next week if doesn't improve.  10.  BPH/UTI.  Was on Flomax 0.4 mg daily.   UTI greater than 80,000 Staphylococcus/Enterococcus completing course of Bactrim- please see neurogenic bladder-  11.  Surgical PCR screening positive.  Bactroban as directed 12. Hyperthyroidism- had lost 70 lbs in 6 months- in middle of treatment  6/1- per Endo, want labs checked before appointment 6/7- will order thyroid labs for tomorrow, so they are back for appointment.   6/2- TSH <0.010 and free T4 1.03- free T3 pending.   6/3- pt  to have endo phone appt 6/9- asked him to let therapy know to schedule therapy around it.   6/7- has appt today per pt- will let me know if  there's any changes!  6/8- allowed to stop Prednisone and will wait on Synthroid for today.  13. Fatty liver-will need to be careful with any medicine going through liver. Placed nursing order to request that protein shake recommended by his hepatologist be given at night  6/7- explained to pt that tylenol can affect liver, not the other way around.  14. Neurogenic bladder- will start on I/o caths q6 hours prn and increase Flomax to 0.8 mg with supper  6/7- will change order to cath if volumes >250cc  6/10- will call Urology for advise- is usually requiring caths still- but voiding some- don't want pt to get Foley (if goes to SNF).  15.  Neurogenic bowel- doesn't have good control- will start with suppository- and dig stim and see if can withdraw any of them over time.   6/2- Pt having good results with bowel program- still requiring some dig stim, but less.   6/5: add miralax mixed in prune juice to dinner.   6/6- BM on toilet with dig stim/supp- improving!- con't regimen  6/9- BM on toilet, but having accidents- needs more dig stim.  16. Spasticity- will increase Baclofen to 10 mg TID and con't Flexeril.   D/c'd flexeril  5/31- will d/w pt going up on baclofen cannot do zanaflex due to low BP and cannot do dantrolene due to liver issues.  6/10- spasticity controlled per pt- con't regimen  17. Orthostatic hypotension  5/26- added TEDs and Abd binder during day.  5/31- decreased metoprolol   6/8- will stop Losartan 18. Ileus  5/29 -pt much improved today   -likely d/t neuro, meds, etc   -continue clear liquid diet   -continue IV reglan    -primary team can adjust bowel program tomorrow   -recheck KUB in AM for resolution   5/30- will restart regular diet- ileus resolved- also will increase senokot a little- reason got ileus is was refusing bowel program.  19. R shoulder pain  5/30- will try to do R shoulder injection tomorrow 5/31- R shoulder injections for subacromial and RTC/posterior  shoulder injection today  6/2-6/5 pain MUCH better- continue regimen 5/31-received steroid injection.   6/10- will get shoulder xray as above- and if need be, MRI of R shoulder since pain has come back.  20. Nausea: resolved: changed valium and reglan to PRN 21. R>L knee pain  6/7- added tramadol 50 mg q6 hours prn and stopped Oxycodone 10 mg- take oxy only if severe pain; try Tramadol, not tlyenol when hurting BEFORE therapy.  6/10- doing well with tramadol c-on't regimen 22. Dispo  6/8- spent a long time going over that we, as a team, think he would benefit from more time at SNF- for 1-2 months, not long term- OT offered to speak with pt about this.       LOS: 17 days A FACE TO FACE EVALUATION WAS PERFORMED  Nalayah Hitt 07/22/2020, 8:11 AM

## 2020-07-23 MED ORDER — DICLOFENAC SODIUM 1 % EX GEL
2.0000 g | Freq: Four times a day (QID) | CUTANEOUS | Status: DC
  Administered 2020-07-23 – 2020-08-10 (×56): 2 g via TOPICAL
  Filled 2020-07-23 (×2): qty 100

## 2020-07-23 NOTE — Progress Notes (Signed)
PROGRESS NOTE   Subjective/Complaints:  NO accident this AM even after dig stim by nursing this AM.  Darren Harrison over xray results of R shoulder.   No complaints except R shoulder  ROS:   Pt denies SOB, abd pain, CP, N/V/C/D, and vision changes    Objective:   DG Shoulder Right  Result Date: 07/22/2020 CLINICAL DATA:  Right shoulder pain for 2 months. No acute injury. Falls in the past. EXAM: RIGHT SHOULDER - 2+ VIEW COMPARISON:  None. FINDINGS: The humeral head is high riding. Glenohumeral degenerative changes. No fracture or dislocation. AC joint degenerative changes are noted. IMPRESSION: 1. The humeral head is high riding suggesting rotator cuff pathology. 2. Glenohumeral degenerative change.  AC joint degenerative changes. Electronically Signed   By: Dorise Bullion III M.D   On: 07/22/2020 13:24   No results for input(s): WBC, HGB, HCT, PLT in the last 72 hours.  No results for input(s): NA, K, CL, CO2, GLUCOSE, BUN, CREATININE, CALCIUM in the last 72 hours.   Intake/Output Summary (Last 24 hours) at 07/23/2020 1459 Last data filed at 07/23/2020 1300 Gross per 24 hour  Intake 686 ml  Output 1100 ml  Net -414 ml     Pressure Injury 06/28/20 Thigh Left;Posterior;Proximal Stage 3 -  Full thickness tissue loss. Subcutaneous fat may be visible but bone, tendon or muscle are NOT exposed. (Active)  06/28/20 1600 (Assessed at office prior to admission)  Location: Thigh  Location Orientation: Left;Posterior;Proximal  Staging: Stage 3 -  Full thickness tissue loss. Subcutaneous fat may be visible but bone, tendon or muscle are NOT exposed.  Wound Description (Comments):   Present on Admission: Yes     Pressure Injury 07/05/20 Coccyx Mid Stage 1 -  Intact skin with non-blanchable redness of a localized area usually over a bony prominence. (Active)  07/05/20 1540  Location: Coccyx  Location Orientation: Mid  Staging: Stage 1 -   Intact skin with non-blanchable redness of a localized area usually over a bony prominence.  Wound Description (Comments):   Present on Admission: Yes    Physical Exam: Vital Signs Blood pressure 101/68, pulse 60, temperature 98.7 F (37.1 C), temperature source Oral, resp. rate 20, height 5\' 10"  (1.778 m), weight 74.8 kg, SpO2 97 %.         General: awake, alert, appropriate, sitting up in bed; wife at bedside; NAD HENT: conjugate gaze; oropharynx moist CV: regular rate; no JVD Pulmonary: CTA B/L; no W/R/R- good air movement GI: soft, NT, ND, (+)BS Psychiatric: appropriate Neurological: Ox3 MAS of 1+ in LE's B/L  Musculoskeletal:  R knee- painful- a lot of crepitus- TTP and pain with ROM       General: TTP over R shoulder- over biceps tendon as well as R GH joint and AC joint still TTP; crepitus with ROM     Comments: RUE- biceps 3/5, WE 4-/5, triceps 3/5, grip 3+/5, and FA 1/5- developing muscle atrophy in RUE as well as in teres and upper traps on R>L LUE- all muscles 4+/5 RLE- HF 3-/5, KE 3+/5, DF 3-/5, and PF 2-/5, EHL 2/5 LLE- HF 4+/5, KE 5-/5, DF 4/5 and PF 2+/5; EHL 2/5  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 Sensation: RUE C4 ok- decreased from C5 to S1 on R LUE- C5 OK- decreased from C6 to S1 on L    Assessment/Plan: 1. Functional deficits which require 3+ hours per day of interdisciplinary therapy in a comprehensive inpatient rehab setting. Physiatrist is providing close team supervision and 24 hour management of active medical problems listed below. Physiatrist and rehab  team continue to assess barriers to discharge/monitor patient progress toward functional and medical goals  Care Tool:  Bathing  Bathing activity did not occur: Refused Body parts bathed by patient: Right arm, Left arm, Chest, Abdomen, Front perineal area, Right upper leg, Left upper leg, Face   Body parts bathed by helper: Buttocks, Right lower leg, Left lower leg     Bathing assist Assist Level: Minimal Assistance - Patient > 75%     Upper Body Dressing/Undressing Upper body dressing   What is the patient wearing?: Pull over shirt, Orthosis    Upper body assist Assist Level: Minimal Assistance - Patient > 75%    Lower Body Dressing/Undressing Lower body dressing      What is the patient wearing?: Pants     Lower body assist Assist for lower body dressing: Maximal Assistance - Patient 25 - 49%     Toileting Toileting    Toileting assist Assist for toileting: Dependent - Patient 0%     Transfers Chair/bed transfer  Transfers assist     Chair/bed transfer assist level: Moderate Assistance - Patient 50 - 74% (slide board)     Locomotion Ambulation   Ambulation assist   Ambulation activity did not occur: Safety/medical concerns  Assist level: Dependent - Patient 0% Assistive device: Lite Gait Max distance: 100'   Walk 10 feet activity   Assist  Walk 10 feet activity did not occur: Safety/medical concerns  Assist level: Dependent - Patient 0% Assistive device: Lite Gait   Walk 50 feet activity   Assist Walk 50 feet with 2 turns activity did not occur: Safety/medical concerns  Assist level: Dependent - Patient 0% Assistive device: Lite Gait    Walk 150 feet activity   Assist Walk 150 feet activity did not occur: Safety/medical concerns         Walk 10 feet on uneven surface  activity   Assist Walk 10 feet on uneven surfaces activity did not occur: Safety/medical concerns         Wheelchair     Assist Will patient use  wheelchair at discharge?: Yes Type of Wheelchair: Manual    Wheelchair assist level: Supervision/Verbal cueing Max wheelchair distance: 150'    Wheelchair 50 feet with 2 turns activity    Assist        Assist Level: Supervision/Verbal cueing   Wheelchair 150 feet activity     Assist      Assist Level: Supervision/Verbal cueing   Blood pressure 101/68, pulse 60, temperature 98.7 F (37.1 C), temperature source Oral, resp. rate 20, height 5\' 10"  (1.778 m), weight 74.8 kg, SpO2 97 %.  Medical Problem List and Plan: 1.  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  Status post C3-4 laminectomy with posterior arthrodesis cervical instrumentation 06/29/2020.  Cervical brace as directed.             -patient may  Shower with neck brace/incision covered             -ELOS/Goals: ~3 weeks- min A -will order PRAFOs and have pt wear nightly to prevent ankle contractures  5/26- con't PT and OT- spent 20 minutes going over ASIA level, and info about SCI including neurogenic bowel and bladder, spasticity and increased risk of DVT/pressure ulcers.   --con't PT and OT- discussed at length with pt goal is Min A- if so, will need 24/7 A at home- I don't think he'll get to Mod I (explained this)- made agreement if pt does, will reward with singing! But think this is unlikely- I would think about short term SNF-   Con't PT and OT- pt willing IF need be, to go to SNF, but wants to see if can get good enough to go home- is progressing faster than expected as of yesterday- made some great gains- con't regimen  -con't PT and OT-  2.  Antithrombotics: -DVT/anticoagulation: Lovenox.  Vascular study reviewed and negative.  5/26- will need for at least 2 months- maybe 3 months total from surgery. 6/3- will need for a total of 3 months  3. Pain Management: Baclofen 5 mg 3 times daily, oxycodone and Flexeril as needed  5/26- pain pretty well controlled- con't  regimen  5/28-   con't regimen prn  5/31- will do R shoulder injection today.  6/3- Shoulder injection 5/31- R shoulder pain almost gone with injection   6/7- Added tramadol- got rid of 10 mg Oxy-   6/10- taking tramadol- works for R >L knee, but shoulder now acting up again- will get xray and go from there- con't regimen  6/11- tramadol- not really taking oxy- con't regimen- add voltaren gel QID.  4. Mood: Provide emotional support  5/26- pt has been tearful understandably- will try Celexa 10 mg daily  5/31- tolerating, will increase to 20 mg daily  6/6- happy with improving results, so brighter- con't regimen             -antipsychotic agents: N/A 5. Neuropsych: This patient is capable of making decisions on his own behalf. 6. Skin/Wound Care: Routine skin checks 7. Fluids/Electrolytes/Nutrition: Routine in and outs with follow-up chemistries 8.  Atrial fibrillation.  Lopressor 25 mg twice daily.  Cardiac rate controlled  5/28 overall controlled- con't regimen  5/31- will decrease Metoprolol to 12.5 mg daily (was qday) since heart rate on low side and BP low- don't want him orthostatic due to SCI.   6/10- HR 60 this AM- holding metoprolol more than giving. Likely worse due to SCI bradycardia- if doesn't improve, will call Cards next week.  9.  Hypertension with orthostatic hypotension.  Cozaar 50 mg daily.  Monitor with increased mobility  5/28 elevated today.  5/30- BP 90s/50s- will monitor for orthostatic hypotension  5/31- will decrease metoprolol as above  6/8- per pt , no dizziness, however BP low- will stop Losartan and con't metoprolol since for rate control.   6/10- BP soft again 90/s50s- off losartan- only on Metoprolol lowest dose- will call Cards next week if doesn't improve.  10.  BPH/UTI.  Was on Flomax 0.4 mg daily.   UTI greater than 80,000 Staphylococcus/Enterococcus completing course of Bactrim- please see neurogenic bladder-  11.  Surgical PCR screening positive.   Bactroban as directed 12. Hyperthyroidism- had lost 70 lbs in 6 months- in middle of treatment  6/1- per Endo, want labs checked before appointment 6/7- will order thyroid labs for tomorrow, so they are back for appointment.   6/2- TSH <0.010 and free T4 1.03- free T3 pending.   6/3- pt to have endo phone appt 6/9- asked him to let therapy know to schedule therapy around it.   6/7- has appt today per pt- will let me know if there's any changes!  6/8- allowed to stop Prednisone and will wait on Synthroid for today.  13. Fatty liver-will need to be careful with any medicine going through liver. Placed nursing order to request that protein shake recommended by his hepatologist be given at night  6/7- explained to pt that tylenol can affect liver, not the other way around.  14. Neurogenic bladder- will start on I/o caths q6 hours prn and increase Flomax to 0.8 mg with supper  6/7- will change order to cath if volumes >250cc  6/10- will call Urology for advise- is usually requiring caths still- but voiding some- don't want pt to get Foley (if goes to SNF).  15.  Neurogenic bowel- doesn't have good control- will start with suppository- and dig stim and see if can withdraw any of them over time.   6/2- Pt having good results with bowel program- still requiring some dig stim, but less.   6/5: add miralax mixed in prune juice to dinner.   6/6- BM on toilet with dig stim/supp- improving!- con't regimen  6/9- BM on toilet, but having accidents- needs more dig stim. 6/11- changed bowel meds to AM- no accidents- con't regimen  16. Spasticity- will increase Baclofen to 10 mg TID and con't Flexeril.   D/c'd flexeril  5/31- will d/w pt going up on baclofen cannot do zanaflex due to low BP and cannot do dantrolene due to liver issues.  6/10- spasticity controlled per pt- con't regimen  17. Orthostatic hypotension  5/26- added TEDs and Abd binder during day.  5/31- decreased metoprolol   6/8- will stop  Losartan 18. Ileus  5/29 -pt much improved today   -likely d/t neuro, meds, etc   -continue clear liquid diet   -continue IV reglan    -primary team can adjust bowel program tomorrow   -recheck KUB in AM for resolution   5/30- will restart regular diet- ileus resolved- also will increase senokot a little- reason got ileus is was refusing bowel program.  19. R shoulder pain  5/30- will try to do R shoulder injection tomorrow 5/31- R shoulder injections for subacromial and RTC/posterior shoulder injection today  6/2-6/5 pain MUCH better- continue regimen 5/31-received steroid injection.   6/10- will get shoulder xray as above- and if need be, MRI of R shoulder since pain has come back.   6/11- xray shows likely RTC injury and significant DJD of R shoulder- GH and AC- will start Voltaren gel QID.  20. Nausea: resolved: changed valium and reglan to PRN 21. R>L knee pain  6/7- added tramadol 50 mg q6 hours prn and stopped Oxycodone 10 mg- take oxy only if severe pain; try Tramadol, not tlyenol when hurting BEFORE therapy.  6/10- doing well with tramadol c-on't regimen 22. Dispo  6/8- spent a long time going over that we, as a team, think he would benefit from more time at SNF- for 1-2 months, not long term- OT offered to speak  with pt about this.   6/11- d/w pt and wife who were in room- will see how he does this next week.      LOS: 18 days A FACE TO FACE EVALUATION WAS PERFORMED  Darren Harrison 07/23/2020, 2:59 PM

## 2020-07-23 NOTE — Progress Notes (Signed)
Occupational Therapy Session Note  Patient Details  Name: Darren Harrison MRN: [id number] Date of Birth: 10-28-1955  Today's Date: 07/23/2020 OT Individual Time: 0900-1000 OT Individual Time Calculation (min): 60 min   Today's Date: 07/23/2020 OT Individual Time: 1400-1430 OT Individual Time Calculation (min): 30 min   Short Term Goals: Week 3:  OT Short Term Goal 1 (Week 3): Pt will perform UB dressing with min A seated in w/c OT Short Term Goal 2 (Week 3): Pt will complete LB dressing with mod A at bed level OT Short Term Goal 3 (Week 3): Pt will perform SB transfers with min A OT Short Term Goal 4 (Week 3): Pt will complete sit<>stand in Stedy with min A  Skilled Therapeutic Interventions/Progress Updates:    Session 1:  Pt received in bed with "some" pain in shoulder, but declines intervention anxious to get started  ADL:  Pt reporting did all ADLs by himself!   Therapeutic exercise Clamshells in sidelying with C collar on: 1x10 no resistance focus of control decent of adduction 1x10 min manual resistance adduction, only weight of hand abduction W/c propulsion LES only 109mn forward-hamstring activation; 2 min backward- quad activiation Therex in prep for functional transfers requried for ADLs   Therapeutic activity Transfer training EOB<>w/c<>EOM with MIN A for uphill only and S for level/downhill. Pt requires cuing to place board/angle.  Pt left at end of session in bed with exit alarm on, call light in reach and all needs met  Session 2:  Pt received in  bed with no pain reported. Pt "feeling it" in his knees during tx but not "pain."  Therapeutic exercise 2 min intervals 1 min rest  Interval 1: 60 CM/sec RPE 4 Interval 2: 50 CM/sec RPE 5 Interval 3: 40 CM/sec RPE 5  Changed positioning unsupported sitting for improved core engagement as well as glute activation  Interval 1 & 2: 40 CM/sec RPE 6 & 6.5   SB transfer into/OOB with CGA-S! Improved board  placement this session  Pt left at end of session in bed with exit alarm on, call light in reach and all needs met   Therapy Documentation Precautions:  Precautions Precautions: Cervical, Fall Precaution Comments: reviewed precautions Required Braces or Orthoses: Cervical Brace Cervical Brace: Hard collar, Other (comment) (donned in sitting) Restrictions Weight Bearing Restrictions: No General:   Vital Signs: Therapy Vitals Temp: 98.6 F (37 C) Temp Source: Oral Pulse Rate: (!) 57 Resp: 17 BP: 101/69 Patient Position (if appropriate): Lying Oxygen Therapy SpO2: 97 % O2 Device: Room Air Pain:   ADL: ADL Eating: Not assessed Grooming: Not assessed Upper Body Bathing: Maximal assistance Lower Body Bathing: Dependent Upper Body Dressing: Maximal assistance Lower Body Dressing: Dependent Toileting: Dependent Toilet Transfer:  (stedy +2) Vision   Perception    Praxis   Exercises:   Other Treatments:     Therapy/Group: Individual Therapy  STonny Branch6/12/2020, 6:47 AM

## 2020-07-23 NOTE — Progress Notes (Signed)
Before starting bowel program patient presented with small liquid stool present in brief. Proceeded with bowel program at 1830 with dig stim. Educated patient's wife to the importance of program. Suppository presented at 34. Dig stim will be initiated at 1900 then will turn over to next shift. Sanda Linger, LPN

## 2020-07-23 NOTE — Progress Notes (Signed)
Patient received dig sim x1 at [place]. Chamber empty. Suppository introduced at 1850. Next shift to continue with program. Sanda Linger, LPN

## 2020-07-23 NOTE — Progress Notes (Signed)
Patient had a bowel movement in the toilet. Digital stimulation performed afterwards.  No more stool came out.

## 2020-07-23 NOTE — Plan of Care (Signed)
  Problem: Consults Goal: RH SPINAL CORD INJURY PATIENT EDUCATION Description:  See Patient Education module for education specifics.  Outcome: Progressing   Problem: SCI BOWEL ELIMINATION Goal: RH STG MANAGE BOWEL WITH ASSISTANCE Description: STG Manage Bowel with  min Assistance. Outcome: Progressing Goal: RH STG SCI MANAGE BOWEL PROGRAM W/ASSIST OR AS APPROPRIATE Description: STG SCI Manage bowel program w/ min assist or as appropriate. Outcome: Progressing   Problem: SCI BLADDER ELIMINATION Goal: RH STG MANAGE BLADDER WITH ASSISTANCE Description: STG Manage Bladder With  min Assistance Outcome: Progressing Goal: RH STG MANAGE BLADDER WITH MEDICATION WITH ASSISTANCE Description: STG Manage Bladder With Medication With  mod I Assistance. Outcome: Progressing   Problem: RH SKIN INTEGRITY Goal: RH STG SKIN FREE OF INFECTION/BREAKDOWN Description: No new skin issues and healing chronic issues with min assist Outcome: Progressing Goal: RH STG MAINTAIN SKIN INTEGRITY WITH ASSISTANCE Description: STG Maintain Skin Integrity With  min Assistance. Outcome: Progressing Goal: RH STG ABLE TO PERFORM INCISION/WOUND CARE W/ASSISTANCE Description: STG Able To Perform Incision/Wound Care With  min Assistance. Outcome: Progressing   Problem: RH SAFETY Goal: RH STG ADHERE TO SAFETY PRECAUTIONS W/ASSISTANCE/DEVICE Description: STG Adhere to Safety Precautions With cues/reminders Assistance/Device. Outcome: Progressing   Problem: RH PAIN MANAGEMENT Goal: RH STG PAIN MANAGED AT OR BELOW PT'S PAIN GOAL Description: At or below level 4 Outcome: Progressing   Problem: RH KNOWLEDGE DEFICIT SCI Goal: RH STG INCREASE KNOWLEDGE OF SELF CARE AFTER SCI Description: Patient will be able to direct care and wife will be able to manage care at discharge using equipment handouts and educational materials independently Outcome: Progressing

## 2020-07-24 NOTE — Progress Notes (Signed)
Physical Therapy Session Note  Patient Details  Name: Darren Harrison MRN: [id number] Date of Birth: 01-Feb-1956  Today's Date: 07/24/2020 PT Individual Time: 1000-1100; 1400-1445 PT Individual Time Calculation (min): 60 min and 45 min  Short Term Goals: Week 3:  PT Short Term Goal 1 (Week 3): Pt will perform bed mobility with min A consistently PT Short Term Goal 2 (Week 3): Pt will perform least restrictive transfer with min A consistently PT Short Term Goal 3 (Week 3): Pt will initiate w/c mobility in ultra lightweight wheelchair PT Short Term Goal 4 (Week 3): Pt will maintain sitting balance x 5 min with Supervision  Skilled Therapeutic Interventions/Progress Updates:    Session 1: Pt received seated EOB in room with wife present, ready to transfer to w/c. Pt able to complete slide board transfer with CGA at times to close Supervision, even able to place board himself this session! Pt's wife able to observe transfer and described steps of setting up and performing safe transfer. Manual w/c propulsion x 150 ft with use of BUE at Supervision level in standard lightweight manual wheelchair. Slide board transfer w/c to/from mat table with CGA, occasional assist needed to place board to prevent skin shearing. Discussed importance of prevention of wounds or skin tears from slide board. Discussed use of Roho cushion and demonstrated how to inflate and check to see if inflated enough to offload buttocks while seated in w/c. Provided patient with ultralightweight manual w/c to trial this session. Pt is able to propel x 300 ft at Supervision level, reports improved ability to maneuver and propel chair due to decreased weight. Pt continues to struggle to lock brakes independently and manage w/c armrests due to decreased UE strength and fine motor control. Pt unable to fully clear buttocks for pressure relief via w/c pushup due to UE fatigue and possibly w/c armrest style on ultralightweight chair. Will  continue to discuss ideal armrest for patient needs at this time. Provided patient with w/c gloves for improved grip, however he exhibits decreased ability to grasp w/c rims with use of gloves. Pt with no complaints of shoulder pain or other pain following session, will continue to monitor. Pt left seated in w/c in room with needs in reach, wife present at end of session.  Session 2: Pt received seated in w/c in room, agreeable to PT session. No complaints of pain. Manual w/c propulsion to/from therapy gym at Supervision level with use of BUE in ultralightweight manual w/c. Pt able to remove leg rests with min A. Sit to stand in steady initially with min A +2 in increasing to max A +2 with onset of fatigue. Focus on alt LE lifts in stedy with use of mirror for visual feedback. Pt with heavy UE reliance in standing as well as knee support on stedy vs being able to fully unweight one limb. Standing alt L/R lateral weight shifts with mirror and multimodal cueing for upright trunk in standing. Pt requests to return to bed at end of session due to fatigue. Slide board transfer back to bed with min A needed due to safety and sliding forwards on board. Sit to supine mod A for BLE management. Pt left semi-reclined in bed with needs in reach, wife present.  Therapy Documentation Precautions:  Precautions Precautions: Cervical, Fall Precaution Comments: reviewed precautions Required Braces or Orthoses: Cervical Brace Cervical Brace: Hard collar, Other (comment) (donned in sitting) Restrictions Weight Bearing Restrictions: No    Therapy/Group: Individual Therapy   [name], PT,  DPT, CSRS  07/24/2020, 12:21 PM

## 2020-07-24 NOTE — Progress Notes (Signed)
PROGRESS NOTE   Subjective/Complaints:  Pt reports no caths all day yesterday- this AM bladder scan was 234cc, but was able to avoid cathing and had 1 episode needed to double void/use gravity, but no caths since Friday.  Had good BM last night on toilet with bowel program.  Thinks spot on backside is healed.    ROS:   Pt denies SOB, abd pain, CP, N/V/C/D, and vision changes   Objective:   DG Shoulder Right  Result Date: 07/22/2020 CLINICAL DATA:  Right shoulder pain for 2 months. No acute injury. Falls in the past. EXAM: RIGHT SHOULDER - 2+ VIEW COMPARISON:  None. FINDINGS: The humeral head is high riding. Glenohumeral degenerative changes. No fracture or dislocation. AC joint degenerative changes are noted. IMPRESSION: 1. The humeral head is high riding suggesting rotator cuff pathology. 2. Glenohumeral degenerative change.  AC joint degenerative changes. Electronically Signed   By: Dorise Bullion III M.D   On: 07/22/2020 13:24   No results for input(s): WBC, HGB, HCT, PLT in the last 72 hours.  No results for input(s): NA, K, CL, CO2, GLUCOSE, BUN, CREATININE, CALCIUM in the last 72 hours.   Intake/Output Summary (Last 24 hours) at 07/24/2020 1043 Last data filed at 07/24/2020 0800 Gross per 24 hour  Intake 1070 ml  Output 2025 ml  Net -955 ml     Pressure Injury 06/28/20 Thigh Left;Posterior;Proximal Stage 3 -  Full thickness tissue loss. Subcutaneous fat may be visible but bone, tendon or muscle are NOT exposed. (Active)  06/28/20 1600 (Assessed at office prior to admission)  Location: Thigh  Location Orientation: Left;Posterior;Proximal  Staging: Stage 3 -  Full thickness tissue loss. Subcutaneous fat may be visible but bone, tendon or muscle are NOT exposed.  Wound Description (Comments):   Present on Admission: Yes     Pressure Injury 07/05/20 Coccyx Mid Stage 1 -  Intact skin with non-blanchable redness of a  localized area usually over a bony prominence. (Active)  07/05/20 1540  Location: Coccyx  Location Orientation: Mid  Staging: Stage 1 -  Intact skin with non-blanchable redness of a localized area usually over a bony prominence.  Wound Description (Comments):   Present on Admission: Yes    Physical Exam: Vital Signs Blood pressure 100/60, pulse 67, temperature 97.9 F (36.6 C), temperature source Oral, resp. rate 17, height 5\' 10"  (1.778 m), weight 74.8 kg, SpO2 98 %.          General: awake, alert, appropriate, sitting up in bed- wife at bedside; NAD HENT: conjugate gaze; oropharynx moist CV: regular rate; no JVD Pulmonary: CTA B/L; no W/R/R- good air movement GI: soft, NT, ND, (+)BS Psychiatric: appropriate Neurological: Ox3' MAS of 1+ in [place]'.   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 Sensation: RUE C4 ok- decreased from C5 to S1 on R LUE- C5 OK- decreased from C6 to S1 on L    Assessment/Plan: 1. Functional deficits which require 3+ hours per day of interdisciplinary therapy in a comprehensive inpatient rehab  setting. Physiatrist is providing close team supervision and 24 hour management of active medical problems listed below. Physiatrist and rehab team continue to assess barriers to discharge/monitor patient progress toward functional and medical goals  Care Tool:  Bathing  Bathing activity did not occur: Refused Body parts bathed by patient: Right arm, Left arm, Chest, Abdomen, Front perineal area, Right upper leg, Left upper leg, Face   Body parts bathed by helper: Buttocks, Right lower leg, Left lower leg     Bathing assist Assist Level: Minimal Assistance - Patient > 75%     Upper Body Dressing/Undressing Upper body dressing   What is the patient wearing?: Pull over shirt, Orthosis    Upper body assist Assist Level: Minimal Assistance - Patient > 75%    Lower Body Dressing/Undressing Lower body dressing      What is the patient wearing?: Pants     Lower body assist Assist for lower body dressing: Maximal Assistance - Patient 25 - 49%     Toileting Toileting    Toileting assist Assist for toileting: Dependent - Patient 0%     Transfers Chair/bed transfer  Transfers assist     Chair/bed transfer assist level: Moderate Assistance - Patient 50 - 74% (slide board)     Locomotion Ambulation   Ambulation assist   Ambulation activity did not occur: Safety/medical concerns  Assist level: Dependent - Patient 0% Assistive device: Lite Gait Max distance: 100'   Walk 10 feet activity   Assist  Walk 10 feet activity did not occur: Safety/medical concerns  Assist level: Dependent - Patient 0% Assistive device: Lite Gait   Walk 50 feet activity   Assist Walk 50 feet with 2 turns activity did not occur: Safety/medical concerns  Assist level: Dependent - Patient 0% Assistive device: Lite Gait    Walk 150 feet activity   Assist Walk 150 feet activity did not occur: Safety/medical concerns         Walk 10 feet on uneven surface  activity   Assist  Walk 10 feet on uneven surfaces activity did not occur: Safety/medical concerns         Wheelchair     Assist Will patient use wheelchair at discharge?: Yes Type of Wheelchair: Manual    Wheelchair assist level: Supervision/Verbal cueing Max wheelchair distance: 150'    Wheelchair 50 feet with 2 turns activity    Assist        Assist Level: Supervision/Verbal cueing   Wheelchair 150 feet activity     Assist      Assist Level: Supervision/Verbal cueing   Blood pressure 100/60, pulse 67, temperature 97.9 F (  36.6 C), temperature source Oral, resp. rate 17, height 5\' 10"  (1.778 m), weight 74.8 kg, SpO2 98 %.  Medical Problem List and Plan: 1. 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  Status post C3-4 laminectomy with posterior arthrodesis cervical instrumentation 06/29/2020.  Cervical brace as directed.             -patient may  Shower with neck brace/incision covered             -ELOS/Goals: ~3 weeks- min A -will order PRAFOs and have pt wear nightly to prevent ankle contractures  5/26- con't PT and OT- spent 20 minutes going over ASIA level, and info about SCI including neurogenic bowel and bladder, spasticity and increased risk of DVT/pressure ulcers.   --con't PT and OT- discussed at length with pt goal is Min A- if so, will need 24/7 A at home- I don't think he'll get to Mod I (explained this)- made agreement if pt does, will reward with singing! But think this is unlikely- I would think about short term SNF-   Con't PT and OT- pt willing IF need be, to go to SNF, but wants to see if can get good enough to go home- is progressing faster than expected as of yesterday- made some great gains- con't regimen  -con't CIR/PT and OT_ making great gains.  2.  Antithrombotics: -DVT/anticoagulation: Lovenox.  Vascular study reviewed and negative.  5/26- will need for at least 2 months- maybe 3 months total from surgery. 6/3- will  need for a total of 3 months  3. Pain Management: Baclofen 5 mg 3 times daily, oxycodone and Flexeril as needed  5/26- pain pretty well controlled- con't regimen  5/28-   con't regimen prn  5/31- will do R shoulder injection today.  6/3- Shoulder injection 5/31- R shoulder pain almost gone with injection   6/7- Added tramadol- got rid of 10 mg Oxy-   6/10- taking tramadol- works for R >L knee, but shoulder now acting up again- will get xray and go from there- con't regimen  6/11- tramadol- not really taking oxy- con't regimen- add voltaren gel QID.   6/12- voltaren helped some- con't regimen 4. Mood: Provide emotional support  5/26- pt has been tearful understandably- will try Celexa 10 mg daily  5/31- tolerating, will increase to 20 mg daily  6/6- happy with improving results, so brighter- con't regimen             -antipsychotic agents: N/A 5. Neuropsych: This patient is capable of making decisions on his own behalf. 6. Skin/Wound Care: Routine skin checks 7. Fluids/Electrolytes/Nutrition: Routine in and outs with follow-up chemistries 8.  Atrial fibrillation.  Lopressor 25 mg twice daily.  Cardiac rate controlled  5/28 overall controlled- con't regimen  5/31- will decrease Metoprolol to 12.5 mg daily (was qday) since heart rate on low side and BP low- don't want him orthostatic due to SCI.   6/10- HR 60 this AM- holding metoprolol more than giving. Likely worse due to SCI bradycardia- if doesn't improve, will call Cards next week.  9.  Hypertension with orthostatic hypotension.  Cozaar 50 mg daily.  Monitor with increased mobility  5/28 elevated today.  5/30- BP 90s/50s- will monitor for orthostatic hypotension  5/31- will decrease metoprolol as above  6/8- per pt , no dizziness, however BP low- will stop Losartan and con't metoprolol since for rate control.   6/10- BP soft again 90/s50s- off losartan- only on Metoprolol  lowest dose- will call Cards next week if doesn't improve.    6/12- HR 67- con't regimen 10.  BPH/UTI.  Was on Flomax 0.4 mg daily.   UTI greater than 80,000 Staphylococcus/Enterococcus completing course of Bactrim- please see neurogenic bladder-  11.  Surgical PCR screening positive.  Bactroban as directed 12. Hyperthyroidism- had lost 70 lbs in 6 months- in middle of treatment  6/1- per Endo, want labs checked before appointment 6/7- will order thyroid labs for tomorrow, so they are back for appointment.   6/2- TSH <0.010 and free T4 1.03- free T3 pending.   6/3- pt to have endo phone appt 6/9- asked him to let therapy know to schedule therapy around it.   6/7- has appt today per pt- will let me know if there's any changes!  6/8- allowed to stop Prednisone and will wait on Synthroid for today.  13. Fatty liver-will need to be careful with any medicine going through liver. Placed nursing order to request that protein shake recommended by his hepatologist be given at night  6/7- explained to pt that tylenol can affect liver, not the other way around.  14. Neurogenic bladder- will start on I/o caths q6 hours prn and increase Flomax to 0.8 mg with supper  6/7- will change order to cath if volumes >250cc  6/10- will call Urology for advise- is usually requiring caths still- but voiding some- don't want pt to get Foley (if goes to SNF).   6/12- hasn't required caths for 24 hours- needed to double void x1, but sounding great- con't regimen 15.  Neurogenic bowel- doesn't have good control- will start with suppository- and dig stim and see if can withdraw any of them over time.   6/2- Pt having good results with bowel program- still requiring some dig stim, but less.   6/5: add miralax mixed in prune juice to dinner.   6/6- BM on toilet with dig stim/supp- improving!- con't regimen  6/12- BM on toilet with bowel program- going well- con't regimen 16. Spasticity- will increase Baclofen to 10 mg TID and con't Flexeril.   D/c'd flexeril  5/31- will d/w pt going  up on baclofen cannot do zanaflex due to low BP and cannot do dantrolene due to liver issues.  6/10- spasticity controlled per pt- con't regimen  17. Orthostatic hypotension  5/26- added TEDs and Abd binder during day.  5/31- decreased metoprolol   6/8- will stop Losartan 18. Ileus  5/29 -pt much improved today   -likely d/t neuro, meds, etc   -continue clear liquid diet   -continue IV reglan    -primary team can adjust bowel program tomorrow   -recheck KUB in AM for resolution   5/30- will restart regular diet- ileus resolved- also will increase senokot a little- reason got ileus is was refusing bowel program.  19. R shoulder pain  5/30- will try to do R shoulder injection tomorrow 5/31- R shoulder injections for subacromial and RTC/posterior shoulder injection today  6/2-6/5 pain MUCH better- continue regimen 5/31-received steroid injection.   6/10- will get shoulder xray as above- and if need be, MRI of R shoulder since pain has come back.   6/11- xray shows likely RTC injury and significant DJD of R shoulder- GH and AC- will start Voltaren gel QID.  20. Nausea: resolved: changed valium and reglan to PRN 21. R>L knee pain  6/7- added tramadol 50 mg q6 hours prn and stopped Oxycodone 10 mg- take oxy only if severe pain;  try Tramadol, not tlyenol when hurting BEFORE therapy.  6/10- doing well with tramadol c-on't regimen 22. Dispo  6/8- spent a long time going over that we, as a team, think he would benefit from more time at SNF- for 1-2 months, not long term- OT offered to speak with pt about this.   6/11- d/w pt and wife who were in room- will see how he does this next week.      LOS: 19 days A FACE TO FACE EVALUATION WAS PERFORMED  Chloey Ricard 07/24/2020, 10:43 AM

## 2020-07-25 LAB — CBC WITH DIFFERENTIAL/PLATELET
Abs Immature Granulocytes: 0.01 10*3/uL (ref 0.00–0.07)
Basophils Absolute: 0 10*3/uL (ref 0.0–0.1)
Basophils Relative: 0 %
Eosinophils Absolute: 0.1 10*3/uL (ref 0.0–0.5)
Eosinophils Relative: 4 %
HCT: 33 % — ABNORMAL LOW (ref 39.0–52.0)
Hemoglobin: 10.9 g/dL — ABNORMAL LOW (ref 13.0–17.0)
Immature Granulocytes: 0 %
Lymphocytes Relative: 34 %
Lymphs Abs: 1.4 10*3/uL (ref 0.7–4.0)
MCH: 29.9 pg (ref 26.0–34.0)
MCHC: 33 g/dL (ref 30.0–36.0)
MCV: 90.7 fL (ref 80.0–100.0)
Monocytes Absolute: 0.2 10*3/uL (ref 0.1–1.0)
Monocytes Relative: 6 %
Neutro Abs: 2.3 10*3/uL (ref 1.7–7.7)
Neutrophils Relative %: 56 %
Platelets: 157 10*3/uL (ref 150–400)
RBC: 3.64 MIL/uL — ABNORMAL LOW (ref 4.22–5.81)
RDW: 16 % — ABNORMAL HIGH (ref 11.5–15.5)
WBC: 4 10*3/uL (ref 4.0–10.5)
nRBC: 0 % (ref 0.0–0.2)

## 2020-07-25 LAB — COMPREHENSIVE METABOLIC PANEL
ALT: 24 U/L (ref 0–44)
AST: 19 U/L (ref 15–41)
Albumin: 2.6 g/dL — ABNORMAL LOW (ref 3.5–5.0)
Alkaline Phosphatase: 157 U/L — ABNORMAL HIGH (ref 38–126)
Anion gap: 5 (ref 5–15)
BUN: 14 mg/dL (ref 8–23)
CO2: 30 mmol/L (ref 22–32)
Calcium: 8.6 mg/dL — ABNORMAL LOW (ref 8.9–10.3)
Chloride: 102 mmol/L (ref 98–111)
Creatinine, Ser: 0.65 mg/dL (ref 0.61–1.24)
GFR, Estimated: >60 mL/min (ref >60)
Glucose, Bld: 92 mg/dL (ref 70–99)
Potassium: 4.2 mmol/L (ref 3.5–5.1)
Sodium: 137 mmol/L (ref 135–145)
Total Bilirubin: 0.7 mg/dL (ref 0.3–1.2)
Total Protein: 5.3 g/dL — ABNORMAL LOW (ref 6.5–8.1)

## 2020-07-25 NOTE — Progress Notes (Signed)
PROGRESS NOTE   Subjective/Complaints:  No issues overnite, walked with lite gait with PT assist x 2  today   ROS:   Pt denies SOB, abd pain, CP, N/V/C/D, and vision changes   Objective:   No results found. Recent Labs    07/25/20 0630  WBC 4.0  HGB 10.9*  HCT 33.0*  PLT 157    Recent Labs    07/25/20 0630  NA 137  K 4.2  CL 102  CO2 30  GLUCOSE 92  BUN 14  CREATININE 0.65  CALCIUM 8.6*     Intake/Output Summary (Last 24 hours) at 07/25/2020 0950 Last data filed at 07/25/2020 0700 Gross per 24 hour  Intake 458 ml  Output 2030 ml  Net -1572 ml      Pressure Injury 06/28/20 Thigh Left;Posterior;Proximal Stage 3 -  Full thickness tissue loss. Subcutaneous fat may be visible but bone, tendon or muscle are NOT exposed. (Active)  06/28/20 1600 (Assessed at office prior to admission)  Location: Thigh  Location Orientation: Left;Posterior;Proximal  Staging: Stage 3 -  Full thickness tissue loss. Subcutaneous fat may be visible but bone, tendon or muscle are NOT exposed.  Wound Description (Comments):   Present on Admission: Yes     Pressure Injury 07/05/20 Coccyx Mid Stage 1 -  Intact skin with non-blanchable redness of a localized area usually over a bony prominence. (Active)  07/05/20 1540  Location: Coccyx  Location Orientation: Mid  Staging: Stage 1 -  Intact skin with non-blanchable redness of a localized area usually over a bony prominence.  Wound Description (Comments):   Present on Admission: Yes    Physical Exam: Vital Signs Blood pressure 103/72, pulse 68, temperature 98.2 F (36.8 C), temperature source Oral, resp. rate 16, height 5\' 10"  (1.778 m), weight 74.8 kg, SpO2 99 %.  General: No acute distress Mood and affect are appropriate Heart: Regular rate and rhythm no rubs murmurs or extra sounds Lungs: Clear to auscultation, breathing unlabored, no rales or wheezes Abdomen: Positive  bowel sounds, soft nontender to palpation, nondistended Extremities: No clubbing, cyanosis, or edema Skin: No evidence of breakdown, no evidence of rash   Musculoskeletal:  R knee- painful- a lot of crepitus- TTP and pain with ROM       General: TTP over R shoulder- over biceps tendon as well as R GH joint and AC joint still TTP; crepitus with ROM     Comments: RUE- biceps 3/5, WE 4-/5, triceps 3/5, grip 3+/5, and FA 1/5- developing muscle atrophy in RUE as well as in teres and upper traps on R>L LUE- all muscles 4+/5 RLE- HF 3-/5, KE 3+/5, DF 3-/5, and PF 2-/5, LLE- HF 4+/5, KE 5-/5, DF 4/5 and PF 2+/5;  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 Sensation: RUE C4 ok- decreased from C5 to S1 on R LUE- C5 OK- decreased from C6 to S1 on L    Assessment/Plan: 1. Functional deficits which require 3+ hours per day of interdisciplinary therapy in a comprehensive inpatient rehab setting. Physiatrist is providing close team supervision and 24 hour management of active medical problems listed below. Physiatrist and rehab team continue to assess barriers to discharge/monitor patient progress toward functional and medical goals  Care Tool:  Bathing  Bathing activity did not occur: Refused Body parts bathed by patient: Right arm, Left arm, Chest, Abdomen, Front perineal area, Right upper leg, Left upper leg, Face   Body parts bathed by helper: Buttocks, Right lower leg, Left lower leg     Bathing assist Assist Level: Minimal Assistance - Patient > 75%     Upper Body  Dressing/Undressing Upper body dressing   What is the patient wearing?: Pull over shirt, Orthosis    Upper body assist Assist Level: Minimal Assistance - Patient > 75%    Lower Body Dressing/Undressing Lower body dressing      What is the patient wearing?: Pants     Lower body assist Assist for lower body dressing: Moderate Assistance - Patient 50 - 74%     Toileting Toileting    Toileting assist Assist for toileting: Dependent - Patient 0%     Transfers Chair/bed transfer  Transfers assist     Chair/bed transfer assist level: Contact Guard/Touching assist (slide board)     Locomotion Ambulation   Ambulation assist   Ambulation activity did not occur: Safety/medical concerns  Assist level: Dependent - Patient 0% Assistive device: Lite Gait Max distance: 100'   Walk 10 feet activity   Assist  Walk 10 feet activity did not occur: Safety/medical concerns  Assist level: Dependent - Patient 0% Assistive device: Lite Gait   Walk 50 feet activity   Assist Walk 50 feet with 2 turns activity did not occur: Safety/medical concerns  Assist level: Dependent - Patient 0% Assistive device: Lite Gait    Walk 150 feet activity   Assist Walk 150 feet activity did not occur: Safety/medical concerns         Walk 10 feet on uneven surface  activity   Assist Walk 10 feet on uneven surfaces activity did not occur: Safety/medical concerns         Wheelchair     Assist Will patient use wheelchair at discharge?: Yes Type of Wheelchair: Manual    Wheelchair assist level: Supervision/Verbal cueing Max wheelchair distance: 150'    Wheelchair 50 feet with 2 turns activity    Assist        Assist Level: Supervision/Verbal cueing   Wheelchair 150 feet activity     Assist      Assist Level: Supervision/Verbal cueing   Blood pressure 103/72, pulse 68, temperature 98.2 F (36.8 C), temperature source Oral, resp. rate 16, height 5\' 10"   (1.778 m), weight 74.8 kg, SpO2 99 %.  Medical Problem List and Plan: 1. 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  Status post C3-4 laminectomy with posterior arthrodesis cervical instrumentation 06/29/2020.  Cervical brace as directed.             -patient may  Shower with neck brace/incision covered             -ELOS/Goals: ~3 weeks- min A- team conf in am  -will order PRAFOs and  have pt wear nightly to prevent ankle contractures  5/26- con't PT and OT- spent 20 minutes going over ASIA level, and info about SCI including neurogenic bowel and bladder, spasticity and increased risk of DVT/pressure ulcers.   --con't PT and OT- discussed at length with pt goal is Min A- if so, will need 24/7 A at home- I don't think he'll get to Mod I (explained this)- made agreement if pt does, will reward with singing! But think this is unlikely- I would think about short term SNF-   Con't PT and OT- pt willing IF need be, to go to SNF, but wants to see if can get good enough to go home- is progressing faster than expected as of yesterday- made some great gains- con't regimen  -con't CIR/PT and OT_ making great gains.   Team conf to reassess progress toward goals in am and set d/c date 2.  Antithrombotics: -DVT/anticoagulation: Lovenox.  Vascular study reviewed and negative.  5/26- will need for at least 2 months- maybe 3 months total from surgery. 6/3- will need for a total of 3 months  3. Pain Management: Baclofen 5 mg 3 times daily, oxycodone and Flexeril as needed  5/26- pain pretty well controlled- con't regimen  5/28-   con't regimen prn  5/31- will do R shoulder injection today.  6/3- Shoulder injection 5/31- R shoulder pain almost gone with injection   6/7- Added tramadol- got rid of 10 mg Oxy-   6/10- taking tramadol- works for R >L knee, but shoulder now acting up again- will get xray and go from there- con't regimen  6/11- tramadol- not really taking oxy-  con't regimen- add voltaren gel QID.   6/12- voltaren helped some- con't regimen 4. Mood: Provide emotional support  5/26- pt has been tearful understandably- will try Celexa 10 mg daily  5/31- tolerating, will increase to 20 mg daily  6/6- happy with improving results, so brighter- con't regimen             -antipsychotic agents: N/A 5. Neuropsych: This patient is capable of making decisions on his own behalf. 6. Skin/Wound Care: Routine skin checks 7. Fluids/Electrolytes/Nutrition: Routine in and outs with follow-up chemistries 8.  Atrial fibrillation.  Lopressor 25 mg twice daily.  Cardiac rate controlled  5/28 overall controlled- con't regimen  5/31- will decrease Metoprolol to 12.5 mg daily (was qday) since heart rate on low side and BP low- don't want him orthostatic due to SCI.   6/10- HR 60 this AM- holding metoprolol more than giving. Likely worse due to SCI bradycardia- if doesn't improve, will call Cards next week.  9.  Hypertension with orthostatic hypotension.  Cozaar 50 mg daily.  Monitor with increased mobility  5/28 elevated today.  5/30- BP 90s/50s- will monitor for orthostatic hypotension  5/31- will decrease metoprolol as above  6/8- per pt , no dizziness, however BP low- will stop Losartan and con't metoprolol since for rate control.   6/10- BP soft again 90/s50s- off losartan- only on Metoprolol lowest dose- will call Cards next week if doesn't improve.   6/12- HR 67- con't regimen Vitals:   07/24/20 1941 07/25/20 0453  BP: 103/71 103/72  Pulse: 61 68  Resp: 16 16  Temp: 98.2 F (36.8 C) 98.2 F (36.8 C)  SpO2: 95% 99%   Asymptomatic brady , BP in range to be expected given SCI - no dizziness with ambulation today  10.  BPH/UTI.  Was on Flomax 0.4 mg daily.  UTI greater than 80,000 Staphylococcus/Enterococcus completing course of Bactrim- please see neurogenic bladder-  11.  Surgical PCR screening positive.  Bactroban as directed 12. Hyperthyroidism- had lost 70  lbs in 6 months- in middle of treatment  6/1- per Endo, want labs checked before appointment 6/7- will order thyroid labs for tomorrow, so they are back for appointment.   6/2- TSH <0.010 and free T4 1.03- free T3 pending.   6/3- pt to have endo phone appt 6/9- asked him to let therapy know to schedule therapy around it.   6/7- has appt today per pt- will let me know if there's any changes!  6/8- allowed to stop Prednisone and will wait on Synthroid for today.  13. Fatty liver-will need to be careful with any medicine going through liver. Placed nursing order to request that protein shake recommended by his hepatologist be given at night  6/7- explained to pt that tylenol can affect liver, not the other way around.  14. Neurogenic bladder- will start on I/o caths q6 hours prn and increase Flomax to 0.8 mg with supper  6/7- will change order to cath if volumes >250cc  6/10- will call Urology for advise- is usually requiring caths still- but voiding some- don't want pt to get Foley (if goes to SNF).   6/12- hasn't required caths for 24 hours- needed to double void x1, but sounding great- con't regimen 6/13 ICP x 1 on 6/12 otherwise voiding with low PVRs  15.  Neurogenic bowel- doesn't have good control- will start with suppository- and dig stim and see if can withdraw any of them over time.   6/2- Pt having good results with bowel program- still requiring some dig stim, but less.   6/5: add miralax mixed in prune juice to dinner.   6/6- BM on toilet with dig stim/supp- improving!- con't regimen  Last BM on 6/12 cont bowel program, daily dulc supp 16. Spasticity- will increase Baclofen to 10 mg TID and con't Flexeril.   D/c'd flexeril  5/31- will d/w pt going up on baclofen cannot do zanaflex due to low BP and cannot do dantrolene due to liver issues.  6/10- spasticity controlled per pt- con't regimen  17. Orthostatic hypotension  5/26- added TEDs and Abd binder during day.  5/31- decreased  metoprolol   6/8- will stop Losartan 18. Ileus     Resolved tolerates Regular diet 19. R shoulder pain  5/30- will try to do R shoulder injection tomorrow 5/31- R shoulder injections for subacromial and RTC/posterior shoulder injection today  6/2-6/5 pain MUCH better- continue regimen 5/31-received steroid injection.   6/10- will get shoulder xray as above- and if need be, MRI of R shoulder since pain has come back.   6/11- xray shows likely RTC injury and significant DJD of R shoulder- GH and AC- will start Voltaren gel QID.  20. Nausea: resolved: changed valium and reglan to PRN 21. R>L knee pain  6/7- added tramadol 50 mg q6 hours prn and stopped Oxycodone 10 mg- take oxy only if severe pain; try Tramadol, not tlyenol when hurting BEFORE therapy.  6/10- doing well with tramadol c-on't regimen 22. Dispo  6/8- spent a long time going over that we, as a team, think he would benefit from more time at SNF- for 1-2 months, not long term- OT offered to speak with pt about this.   6/11- d/w pt and wife who were in room- will see how he does this next week.  Reassess in  team conf in am      LOS: 20 days A FACE TO Kite E Hoorain Kozakiewicz 07/25/2020, 9:50 AM

## 2020-07-25 NOTE — Progress Notes (Signed)
Occupational Therapy Session Note  Patient Details  Name: Darren Harrison MRN: [id number] Date of Birth: [birth year]  Today's Date: 07/25/2020 OT Individual Time: 2683-4196 OT Individual Time Calculation (min): 70 min    Short Term Goals: Week 3:  OT Short Term Goal 1 (Week 3): Pt will perform UB dressing with min A seated in w/c OT Short Term Goal 2 (Week 3): Pt will complete LB dressing with mod A at bed level OT Short Term Goal 3 (Week 3): Pt will perform SB transfers with min A OT Short Term Goal 4 (Week 3): Pt will complete sit<>stand in Stedy with min A  Skilled Therapeutic Interventions/Progress Updates:    Pt resting in bed upon arrival. Supine>sit EOB with supervision. Pt donned pullover shirt with supervision. Pt required assistance to don C-collar. Pt able to thread pants over BLE and initiate pulling over hips with lateral leans. Pt required assistance to complete task. Pt donned shoes without assistance. SB transfer to w/c with CGA. Pt propelled w/c to gym and completed SB transfer to EOM. Focus on lateral leans and crossing LE over contralateral LE to facilitate pulling pants over hips. Pt practiced pulling paper scrubs over hips. Pt improving but continues to require min A to pull up in middle. Pt also completed push ups on yoga blocks-3x5. Pt transferred to w/c and complete 3 mins level 2 on [employer]. Pt returned to room and remained in w/c. All needs within reach.   Therapy Documentation Precautions:  Precautions Precautions: Cervical, Fall Precaution Comments: reviewed precautions Required Braces or Orthoses: Cervical Brace Cervical Brace: Hard collar, Other (comment) (donned in sitting) Restrictions Weight Bearing Restrictions: No   Pain:  Pt c/o increased Rt shoulder discomfort; activity and exercise with some relief   Therapy/Group: Individual Therapy  [name] 07/25/2020, 9:36 AM

## 2020-07-25 NOTE — Progress Notes (Signed)
Physical Therapy Session Note  Patient Details  Name: Darren Harrison MRN: [id number] Date of Birth: 1955/10/26  Today's Date: 07/25/2020 PT Individual Time: 0930-1030; 1400-1430; 1630-1700 PT Individual Time Calculation (min): 60 min and 30 min and 30 min  Short Term Goals:  Week 3:  PT Short Term Goal 1 (Week 3): Pt will perform bed mobility with min A consistently PT Short Term Goal 2 (Week 3): Pt will perform least restrictive transfer with min A consistently PT Short Term Goal 3 (Week 3): Pt will initiate w/c mobility in ultra lightweight wheelchair PT Short Term Goal 4 (Week 3): Pt will maintain sitting balance x 5 min with Supervision   Skilled Therapeutic Interventions/Progress Updates:    Session 1: Pt received seated in w/c in room, agreeable to PT session. Pt reports some R shoulder pain at rest, nursing able to provide pain medication and apply voltarin gel to R shoulder. Manual w/c propulsion x 150 ft with use of BUE at Supervision level. Session focus on standing and gait training with use of LiteGait. Ambulation x 200 ft with LiteGait with assist needed for lateral weight shift, occasional advancement of LE and widening BOS, and blocking R knee due to buckling. Pt exhibits improved ability to take steps with LiteGait this date, does require unweighting from [place]. Slide board transfer back to bed with CGA. Sit to supine CGA, pt exhibits improved ability to maneuver BLE back into bed more independently this date! Pt left seated in bed with needs in reach, bed alarm in place at end of session.  Session 2: Pt received seated in bed, agreeable to PT session. No complaints of pain. Session focus on BLE strengthening at bed level. Supine bridges 2 x 5 reps, SKFO x 10 reps, and hip flexion x 10 reps each. Pt requests to get up to w/c at end of session. Supine to sit with CGA for some trunk control from flat bed, use of bedrail. Slide board transfer bed to w/c with CGA. Pt left seated  in w/c in room with needs in reach at end of session.  Session 3: Pt received seated in w/c in room, agreeable to PT session. No complaints of pain. Manual w/c propulsion to/from therapy gym at Supervision level. Pt is able to manage w/c parts including swing away arm-rests and leg rests at min A level. Reviewed pressure relief techniques. Pt able to perform w/c pushup and adequately clear buttocks but unable to hold for longer than 10 sec. Pt also able to perform lateral leans with legs crossed and hold for longer period of time, but unable to fully manage armrests and setting up chair safely and independently quite yet. Pt left seated in w/c in room with needs in reach at end of session.  Therapy Documentation Precautions:  Precautions Precautions: Cervical, Fall Precaution Comments: reviewed precautions Required Braces or Orthoses: Cervical Brace Cervical Brace: Hard collar, Other (comment) (donned in sitting) Restrictions Weight Bearing Restrictions: No    Therapy/Group: Individual Therapy   [name], PT, DPT, CSRS  07/25/2020, 12:28 PM

## 2020-07-25 NOTE — Progress Notes (Signed)
Per reports suppository and dig stim x2 performed. Pt noted with no results, dig stim performed x1 by this Probation officer. Pt assisted to toilet and had large soft bowel movement. No stool palpated in rectal vault at this time.

## 2020-07-25 NOTE — Progress Notes (Signed)
Pt voiding throughout the night. However required in and out cath after double voiding due to volume scanning >250 ml. Pt last scan 215 ml after incontinence episode this am at 0530. Rested well throughout the night.

## 2020-07-26 NOTE — Progress Notes (Signed)
Physical Therapy Session Note  Patient Details  Name: Darren Harrison MRN: [id number] Date of Birth: April 15, 1955  Today's Date: 07/26/2020 PT Individual Time: 1015-1100 PT Individual Time Calculation (min): 45 min   Short Term Goals: Week 3:  PT Short Term Goal 1 (Week 3): Pt will perform bed mobility with min A consistently PT Short Term Goal 2 (Week 3): Pt will perform least restrictive transfer with min A consistently PT Short Term Goal 3 (Week 3): Pt will initiate w/c mobility in ultra lightweight wheelchair PT Short Term Goal 4 (Week 3): Pt will maintain sitting balance x 5 min with Supervision   Skilled Therapeutic Interventions/Progress Updates:    Session 1: Pt received seated in w/c in room, agreeable to PT session. No complaints of pain this date. Manual w/c propulsion up to 150 ft with use of BUE at Supervision level. Pt is Supervision for management of w/c parts this date. Slide board transfer w/c to/from mat table with CGA. Sit to half-stand from mat table with BUE support on chair armrests with therapist seated in front of patient, knees blocked, max to total A to stand. Sit to stand in // bars with max to total A with B knees blocked. Pt able to perform alt L/R lifts in // bars with heavy knee blocking in stance and max A for balance. Pt even able to progress to taking a few steps forwards in // bars with total A and knees, blocked, w/c follow this date! Pt left seated in w/c in room with needs in reach at end of session.  Session 2: Pt received seated in w/c in room, agreeable to PT session. Pt reports ongoing stiffness in low back. Manual w/c propulsion x 150 ft at Supervision level. Slide board transfer to mat table with CGA. Sit to supine mod A needed for BLE management. Reviewed LTR for low back stretch, pt reports relief of back stiffness with stretching. Supine bridges 2 x 10 reps, SKFO x 10 reps B, heel slides x 15 reps B, sidelying clamshells x 10 reps B. Pt returned to  sitting EOB with min A needed for trunk elevated. Seated LAQ x 10 reps each. Pt continues to exhibit B hamstring tightness. Demonstrated how to perform seated HS stretch with use of gait belt. Pt able to perform stretch independently and setup gait belt independently. Pt requests to return to bed at end of session. Slide board transfer back to bed min A due to fatigue. Sit to supine CGA. Provided hot pack at end of session for low back pain management. Pt started timer x 15 min and to call nursing to remove. Pt left seated in bed with needs in reach at end of session.  Therapy Documentation Precautions:  Precautions Precautions: Cervical, Fall Precaution Comments: reviewed precautions Required Braces or Orthoses: Cervical Brace Cervical Brace: Hard collar, Other (comment) (donned in sitting) Restrictions Weight Bearing Restrictions: No   Therapy/Group: Individual Therapy   [name], PT, DPT, CSRS  07/26/2020, 12:27 PM

## 2020-07-26 NOTE — Progress Notes (Signed)
Occupational Therapy Session Note  Patient Details  Name: Darren Harrison MRN: [id number] Date of Birth: January 27, 1956  Today's Date: 07/26/2020 OT Individual Time: 6203-5597 OT Individual Time Calculation (min): 85 min    Short Term Goals: Week 3:  OT Short Term Goal 1 (Week 3): Pt will perform UB dressing with min A seated in w/c OT Short Term Goal 2 (Week 3): Pt will complete LB dressing with mod A at bed level OT Short Term Goal 2 - Progress (Week 3): Met OT Short Term Goal 3 (Week 3): Pt will perform SB transfers with min A OT Short Term Goal 3 - Progress (Week 3): Met OT Short Term Goal 4 (Week 3): Pt will complete sit<>stand in Stedy with min A OT Short Term Goal 4 - Progress (Week 3): Progressing toward goal  Skilled Therapeutic Interventions/Progress Updates:    OT intervention with focus on bathing at shower level and dressing seated EOB using lateral leans to pull pants over hips. See Care Tool. SB transfer to w/c with supervision this morning. Pt propelled w/c to gym and performed w/c pushups 3x5. Pt also educated in utilizing lateral leans for pressure relief. Pt demonstrated lateral leans seated in w/c next to EOM. Pt propelled w/c back to room. All needs within reach.   Therapy Documentation Precautions:  Precautions Precautions: Cervical, Fall Precaution Comments: reviewed precautions Required Braces or Orthoses: Cervical Brace Cervical Brace: Hard collar, Other (comment) (donned in sitting) Restrictions Weight Bearing Restrictions: No  Pain:  Pt reports that Bil shoulders "feel stiff" this morning; hot shower and PROM   Therapy/Group: Individual Therapy  [name] 07/26/2020, 12:14 PM

## 2020-07-26 NOTE — Progress Notes (Signed)
PROGRESS NOTE   Subjective/Complaints: Pt reports peeing well- only 1 cath required in last 72 hours- yesterday when just woke up- said didn't get to get up to void or double void.  Good BM last night with bowel program- no accidents since this weekend.  Spasms a little more frequent- mainly in AM before starts therapy, but gone with ROM/therapy.  Asking to stop Protonix since off Steroids.   ROS:   Pt denies SOB, abd pain, CP, N/V/C/D, and vision changes  Objective:   No results found. Recent Labs    07/25/20 0630  WBC 4.0  HGB 10.9*  HCT 33.0*  PLT 157    Recent Labs    07/25/20 0630  NA 137  K 4.2  CL 102  CO2 30  GLUCOSE 92  BUN 14  CREATININE 0.65  CALCIUM 8.6*     Intake/Output Summary (Last 24 hours) at 07/26/2020 0921 Last data filed at 07/26/2020 0745 Gross per 24 hour  Intake 616 ml  Output 2386 ml  Net -1770 ml     Pressure Injury 06/28/20 Thigh Left;Posterior;Proximal Stage 3 -  Full thickness tissue loss. Subcutaneous fat may be visible but bone, tendon or muscle are NOT exposed. (Active)  06/28/20 1600 (Assessed at office prior to admission)  Location: Thigh  Location Orientation: Left;Posterior;Proximal  Staging: Stage 3 -  Full thickness tissue loss. Subcutaneous fat may be visible but bone, tendon or muscle are NOT exposed.  Wound Description (Comments):   Present on Admission: Yes     Pressure Injury 07/05/20 Coccyx Mid Stage 1 -  Intact skin with non-blanchable redness of a localized area usually over a bony prominence. (Active)  07/05/20 1540  Location: Coccyx  Location Orientation: Mid  Staging: Stage 1 -  Intact skin with non-blanchable redness of a localized area usually over a bony prominence.  Wound Description (Comments):   Present on Admission: Yes    Physical Exam: Vital Signs Blood pressure 90/64, pulse (!) 59, temperature 98.4 F (36.9 C), resp. rate 18, height 5'  10" (1.778 m), weight 74.8 kg, SpO2 98 %.   General: awake, alert, appropriate, sitting up in bed; NAD HENT: conjugate gaze; oropharynx moist CV: regular rate; no JVD Pulmonary: CTA B/L; no W/R/R- good air movement GI: soft, NT, ND, (+)BS Psychiatric: appropriate; interactive Neurological: alert Musculoskeletal:  R knee- painful- a lot of crepitus- TTP and pain with ROM       General: TTP over R shoulder- over biceps tendon as well as R GH joint and AC joint still TTP; crepitus with ROM     Comments: RUE- biceps 3/5, WE 4-/5, triceps 3/5, grip 3+/5, and FA 1/5- developing muscle atrophy in RUE as well as in teres and upper traps on R>L LUE- all muscles 4+/5 RLE- HF 3-/5, KE 3+/5, DF 3-/5, and PF 2-/5, LLE- HF 4+/5, KE 5-/5, DF 4/5 and PF 2+/5;  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 Still having a few spasms/extensor tone-  Sensation: RUE C4 ok- decreased from C5 to S1 on R LUE- C5 OK- decreased from C6 to S1 on L    Assessment/Plan: 1. Functional deficits which require 3+ hours per day of interdisciplinary therapy in a comprehensive inpatient rehab setting. Physiatrist is providing close team supervision and 24 hour management of active medical problems listed below. Physiatrist and rehab team continue to assess barriers to discharge/monitor patient progress toward functional and medical goals  Care Tool:  Bathing  Bathing activity did not occur: Refused Body parts bathed by patient: Right arm, Left arm, Chest, Abdomen, Front perineal area, Right upper leg, Left upper  leg, Face   Body parts bathed by helper: Buttocks, Right lower leg, Left lower leg     Bathing assist Assist Level: Minimal Assistance - Patient > 75%     Upper Body Dressing/Undressing Upper body dressing   What is the patient wearing?: Pull over shirt, Orthosis    Upper body assist Assist Level: Minimal Assistance - Patient > 75%    Lower Body Dressing/Undressing Lower body dressing      What is the patient wearing?: Pants     Lower body assist Assist for lower body dressing: Moderate Assistance - Patient 50 - 74%     Toileting Toileting    Toileting assist Assist for toileting: Dependent - Patient 0%     Transfers Chair/bed transfer  Transfers assist     Chair/bed transfer assist level: Contact Guard/Touching assist (slide board)     Locomotion Ambulation   Ambulation assist   Ambulation activity did not occur: Safety/medical concerns  Assist level: Dependent - Patient 0% Assistive device: Lite Gait Max distance: 200'   Walk 10 feet activity   Assist  Walk 10 feet activity did not occur: Safety/medical concerns  Assist level: Dependent - Patient 0% Assistive device: Lite Gait   Walk 50 feet activity   Assist Walk 50 feet with 2 turns activity did not occur: Safety/medical concerns  Assist level: Dependent - Patient 0% Assistive device: Lite Gait    Walk 150 feet activity   Assist Walk 150 feet activity did not occur: Safety/medical concerns  Assist level: Dependent - Patient 0% Assistive device: Lite Gait    Walk 10 feet on uneven surface  activity   Assist Walk 10 feet on uneven surfaces activity did not occur: Safety/medical concerns         Wheelchair     Assist Will patient use wheelchair at discharge?: Yes Type of Wheelchair: Manual    Wheelchair assist level: Supervision/Verbal cueing Max wheelchair distance: 150'    Wheelchair 50 feet with 2 turns activity    Assist        Assist Level:  Supervision/Verbal cueing   Wheelchair 150 feet activity     Assist      Assist Level: Supervision/Verbal cueing   Blood pressure 90/64, pulse (!) 59, temperature 98.4 F (36.9 C), resp. rate 18, height 5\' 10"  (1.778 m), weight 74.8 kg, SpO2 98 %.  Medical Problem List and Plan: 1. 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  Status post C3-4 laminectomy with posterior arthrodesis cervical instrumentation 06/29/2020.  Cervical brace as directed.             -  patient may  Shower with neck brace/incision covered             -ELOS/Goals: ~3 weeks- min A- team conf in am  -will order PRAFOs and have pt wear nightly to prevent ankle contractures  5/26- con't PT and OT- spent 20 minutes going over ASIA level, and info about SCI including neurogenic bowel and bladder, spasticity and increased risk of DVT/pressure ulcers.   --con't PT and OT- discussed at length with pt goal is Min A- if so, will need 24/7 A at home- I don't think he'll get to Mod I (explained this)- made agreement if pt does, will reward with singing! But think this is unlikely- I would think about short term SNF-   Con't PT and OT- pt willing IF need be, to go to SNF, but wants to see if can get good enough to go home- is progressing faster than expected as of yesterday- made some great gains- con't regimen  Con't PT and OT- working on transfers and gait- team conference today- d/c date 6/22.   2.  Antithrombotics: -DVT/anticoagulation: Lovenox.  Vascular study reviewed and negative.  5/26- will need for at least 2 months- maybe 3 months total from surgery. 6/3- will need for a total of 3 months  3. Pain Management: Baclofen 5 mg 3 times daily, oxycodone and Flexeril as needed  5/26- pain pretty well controlled- con't regimen  5/28-   con't regimen prn  5/31- will do R shoulder injection today.  6/3- Shoulder injection 5/31- R shoulder pain almost gone with injection   6/7- Added  tramadol- got rid of 10 mg Oxy-   6/10- taking tramadol- works for R >L knee, but shoulder now acting up again- will get xray and go from there- con't regimen  6/11- tramadol- not really taking oxy- con't regimen- add voltaren gel QID.   6/12- voltaren helped some- con't regimen 4. Mood: Provide emotional support  5/26- pt has been tearful understandably- will try Celexa 10 mg daily  5/31- tolerating, will increase to 20 mg daily  6/6- happy with improving results, so brighter- con't regimen             -antipsychotic agents: N/A 5. Neuropsych: This patient is capable of making decisions on his own behalf. 6. Skin/Wound Care: Routine skin checks 7. Fluids/Electrolytes/Nutrition: Routine in and outs with follow-up chemistries 8.  Atrial fibrillation.  Lopressor 25 mg twice daily.  Cardiac rate controlled  5/28 overall controlled- con't regimen  5/31- will decrease Metoprolol to 12.5 mg daily (was qday) since heart rate on low side and BP low- don't want him orthostatic due to SCI.   6/10- HR 60 this AM- holding metoprolol more than giving. Likely worse due to SCI bradycardia- if doesn't improve, will call Cards next week.   6/14- still holding metoprolol frequently due to bradycardia.  9.  Hypertension with orthostatic hypotension.  Cozaar 50 mg daily.  Monitor with increased mobility  5/28 elevated today.  5/30- BP 90s/50s- will monitor for orthostatic hypotension  5/31- will decrease metoprolol as above  6/8- per pt , no dizziness, however BP low- will stop Losartan and con't metoprolol since for rate control.   6/10- BP soft again 90/s50s- off losartan- only on Metoprolol lowest dose- will call Cards next week if doesn't improve.   6/12- HR 67- con't regimen Vitals:   07/25/20 2024 07/26/20 0637  BP: 106/76 90/64  Pulse: 82 (!) 59  Resp: 17 18  Temp: 97.8 F (  36.6 C) 98.4 F (36.9 C)  SpO2: 98% 98%   Asymptomatic brady , BP in range to be expected given SCI - no dizziness with  ambulation today  10.  BPH/UTI.  Was on Flomax 0.4 mg daily.   UTI greater than 80,000 Staphylococcus/Enterococcus completing course of Bactrim- please see neurogenic bladder-  11.  Surgical PCR screening positive.  Bactroban as directed 12. Hyperthyroidism- had lost 70 lbs in 6 months- in middle of treatment  6/1- per Endo, want labs checked before appointment 6/7- will order thyroid labs for tomorrow, so they are back for appointment.   6/2- TSH <0.010 and free T4 1.03- free T3 pending.   6/3- pt to have endo phone appt 6/9- asked him to let therapy know to schedule therapy around it.   6/7- has appt today per pt- will let me know if there's any changes!  6/8- allowed to stop Prednisone and will wait on Synthroid for today.   6/14- stopping Protonix since pt doesn't need- was since previous Prednisone dose that's stopped.  13. Fatty liver-will need to be careful with any medicine going through liver. Placed nursing order to request that protein shake recommended by his hepatologist be given at night  6/7- explained to pt that tylenol can affect liver, not the other way around.  14. Neurogenic bladder- will start on I/o caths q6 hours prn and increase Flomax to 0.8 mg with supper  6/7- will change order to cath if volumes >250cc  6/10- will call Urology for advise- is usually requiring caths still- but voiding some- don't want pt to get Foley (if goes to SNF).   6/12- hasn't required caths for 24 hours- needed to double void x1, but sounding great- con't regimen 6/13 ICP x 1 on 6/12 otherwise voiding with low PVRs  15.  Neurogenic bowel- doesn't have good control- will start with suppository- and dig stim and see if can withdraw any of them over time.   6/2- Pt having good results with bowel program- still requiring some dig stim, but less.   6/5: add miralax mixed in prune juice to dinner.   6/6- BM on toilet with dig stim/supp- improving!- con't regimen  Last BM on 6/12 cont bowel program,  daily dulc supp 16. Spasticity- will increase Baclofen to 10 mg TID and con't Flexeril.   D/c'd flexeril  5/31- will d/w pt going up on baclofen cannot do zanaflex due to low BP and cannot do dantrolene due to liver issues.  6/14- spasms getting a little worse- wait to increase Baclofen since don't have much more to go up without side effects.   17. Orthostatic hypotension  5/26- added TEDs and Abd binder during day.  5/31- decreased metoprolol   6/8- will stop Losartan 18. Ileus     Resolved tolerates Regular diet 19. R shoulder pain  5/30- will try to do R shoulder injection tomorrow 5/31- R shoulder injections for subacromial and RTC/posterior shoulder injection today  6/2-6/5 pain MUCH better- continue regimen 5/31-received steroid injection.   6/10- will get shoulder xray as above- and if need be, MRI of R shoulder since pain has come back.   6/11- xray shows likely RTC injury and significant DJD of R shoulder- GH and AC- will start Voltaren gel QID.  20. Nausea: resolved: changed valium and reglan to PRN 21. R>L knee pain  6/7- added tramadol 50 mg q6 hours prn and stopped Oxycodone 10 mg- take oxy only if severe pain; try Tramadol, not  tlyenol when hurting BEFORE therapy.  6/10- doing well with tramadol c-on't regimen 22. Dispo  6/8- spent a long time going over that we, as a team, think he would benefit from more time at SNF- for 1-2 months, not long term- OT offered to speak with pt about this.   6/11- d/w pt and wife who were in room- will see how he does this next week.   6/14- team conference today to discuss progress.      LOS: 21 days A FACE TO FACE EVALUATION WAS PERFORMED  Winfrey Chillemi 07/26/2020, 9:21 AM

## 2020-07-26 NOTE — Plan of Care (Signed)
  Problem: RH Bed to Chair Transfers Goal: LTG Patient will perform bed/chair transfers w/assist (PT) Description: LTG: Patient will perform bed to chair transfers with assistance (PT). Flowsheets (Taken 07/26/2020 0754) LTG: Pt will perform Bed to Chair Transfers with assistance level: (upgrade due to progress) Supervision/Verbal cueing Note: Upgrade due to progress

## 2020-07-26 NOTE — Patient Care Conference (Signed)
Inpatient RehabilitationTeam Conference and Plan of Care Update Date: 07/26/2020   Time: 11:03 AM    Patient Name: Darren Harrison      Medical Record Number: [id number]  Date of Birth: 12-Nov-1955 Sex: Male         Room/Bed: 4M10C/4M10C-01 Payor Info: Payor: [employer] / Plan: CIGNA MANAGED / Product Type: *No Product type* /    Admit Date/Time:  07/05/2020  3:07 PM  Primary Diagnosis:  Cervical myelopathy [facility])  Hospital Problems: Principal Problem:   Cervical myelopathy ([place]) Active Problems:   Pressure injury of skin   Neurogenic bowel   Neurogenic bladder   Spasticity    Expected Discharge Date: Expected Discharge Date: 08/03/20  Team Members Present: Physician leading conference: Dr. Courtney Heys Care Coodinator Present: Dorthula Nettles, RN, BSN, CRRN;Becky Dupree, LCSW Nurse Present: Dorthula Nettles, RN PT Present: [name], PT OT Present: Roanna Epley, COTA;Jennifer Tamala Julian, OT PPS Coordinator present : Ileana Ladd, PT     Current Status/Progress Goal Weekly Team Focus  Bowel/Bladder   Incontinent at times. Daily bowel program in no BM during day. Bladder scan Q6, cath if volume >250.  become fully continent and able to move bowels on his own  Assess Qshift and PRN   Swallow/Nutrition/ Hydration             ADL's   LB dressing seated EOB-mod A; UB dressing seated EOB-supervision; bathing at shower level-min A; SB transfers CGA (level surface); toileting-max A; toilet transfers-min A  min A overall  sitting balance, BSC transfers; education   Mobility   CGA bed mobility with use of bedrail, CGA SB transfers (level surface), Supervision w/c mobility, +2/dependent for standing and gait with LiteGait  Supervision bed mobility, min A transfers, mod I w/c mobility (will upgrade transfer goal to Supervision)  LE NMR, gait as able with LiteGait, transfers, pressure relief, SCI edu, family education   Communication             Safety/Cognition/ Behavioral Observations             Pain   No C/o pain  remain pain free  Assess Qshift and PRN   Skin   Skin intact.  skin will remain intact  Assess Skin Qshift and PRN     Discharge Planning:  D/c to home with intermittent support from wife who will visit during the her lunch break; works 15 minutes away. Pt will need to be Mod I or intermittent support at discharge.   Team Discussion: Educating on spasticity. Continent B/B, 4/10 pain, incision to neck is CDI, Stage 3 to left thigh fully granulated, stage 1 to coccyx is intact. Patient on target to meet rehab goals: yes, contact guard for bed mobility, slide board is contact guard, independent in W/C. Was able to take a few steps in the parallel bars. Has trouble with pants management, still will have a small smear in brief.    *See Care Plan and progress notes for long and short-term goals.   Revisions to Treatment Plan:  MD adjusting medications.  Teaching Needs: Family education, medication management, pain management, transfer training, gait training, balance training, endurance training, safety awareness.   Current Barriers to Discharge: Decreased caregiver support, Medical stability, Home enviroment access/layout, Wound care, Lack of/limited family support, Weight bearing restrictions, Medication compliance, and Behavior  Possible Resolutions to Barriers: Continue current medications, provide emotional support.     Medical Summary Current Status: bowel program on toilet; continent- per nurse, only skin issues  are incision;  pain 4/10;  Barriers to Discharge: Decreased family/caregiver support;Home enviroment access/layout;Neurogenic Bowel & Bladder;Medical stability;Weight bearing restrictions;Wound care  Barriers to Discharge Comments: only wife to go home with- needs to be mod I to go home with intermittent supervision;  little smear almost every time per OT; SNF vs home? Possible Resolutions to [employer]: d/c protnix; won't increase  baclofen yet- spasms a little better; only 1 cath in last 72 hours;  d/c 6/22 SNF vs home- for now   Continued Need for Acute Rehabilitation Level of Care: The patient requires daily medical management by a physician with specialized training in physical medicine and rehabilitation for the following reasons: Direction of a multidisciplinary physical rehabilitation program to maximize functional independence : Yes Medical management of patient stability for increased activity during participation in an intensive rehabilitation regime.: Yes Analysis of laboratory values and/or radiology reports with any subsequent need for medication adjustment and/or medical intervention. : Yes   I attest that I was present, lead the team conference, and concur with the assessment and plan of the team.   Cristi Loron 07/26/2020, 4:25 PM

## 2020-07-26 NOTE — Progress Notes (Signed)
Occupational Therapy Weekly Progress Note  Patient Details  Name: Darren Harrison MRN: [id number] Date of Birth: Jun 04, 1955  Beginning of progress report period: July 19, 2020 End of progress report period: July 26, 2020  Patient has met 2 of 3 short term goals.  Pt made steady progress with BADLs and functional transfers during the past week. Supine>sit EOB with supervision using bed rails. Pt maintains sitting balance EOB with supervision to don pull over shirt with supervision. Pt requires assistance to don cervical collar. Pt is able to thread pants over BLE while seated EOB and initiates pulling over hips using lateral leans. Pt requires min A to pull pants over hips. SB transfers with CGA/supervision. Pt making steady progress with BUE and core strengthening as reflected in improved independence with BADLs.   Patient continues to demonstrate the following deficits: muscle weakness, decreased cardiorespiratoy endurance, and decreased sitting balance, decreased standing balance, decreased postural control, and decreased balance strategies and therefore will continue to benefit from skilled OT intervention to enhance overall performance with BADL and Reduce care partner burden.  Patient progressing toward long term goals..  Continue plan of care.  OT Short Term Goals Week 3:  OT Short Term Goal 1 (Week 3): Pt will perform UB dressing with min A seated in w/c OT Short Term Goal 2 (Week 3): Pt will complete LB dressing with mod A at bed level OT Short Term Goal 2 - Progress (Week 3): Met OT Short Term Goal 3 (Week 3): Pt will perform SB transfers with min A OT Short Term Goal 3 - Progress (Week 3): Met OT Short Term Goal 4 (Week 3): Pt will complete sit<>stand in [place] with min A OT Short Term Goal 4 - Progress (Week 3): Progressing toward goal Week 4:  OT Short Term Goal 1 (Week 4): STG=LTG secondary to ELOS  Pt  [name] 07/26/2020, 6:43 AM

## 2020-07-26 NOTE — Progress Notes (Signed)
Physical Therapy Weekly Progress Note  Patient Details  Name: Darren Harrison MRN: [id number] Date of Birth: 08/14/1955  Beginning of progress report period: July 20, 2020 End of progress report period: July 26, 2020  Today's Date: 07/26/2020  Patient has met 3 of 4 short term goals. Patient is making good progress towards therapy goals. Pt is currently CGA overall for bed mobility with use of bedrail, CGA to min A for slide board transfers and is even able to place the board more independently, and is Supervision to mod I for w/c mobility. Pt has been able to perform gait dependently via use of LiteGait with assist needed for lateral weight shift and LE placement and to prevent R knee buckling. Pt has also been able to initiate gait training in // bars with max to total A and close w/c follow needed with heavy knee blocking required. Pt remains very motivated and exhibits great participation in therapy sessions. Pt will participate in custom w/c seating evaluation and family education leading up to d/c.  Patient continues to demonstrate the following deficits muscle weakness and muscle joint tightness, decreased cardiorespiratoy endurance, abnormal tone and unbalanced muscle activation, and decreased sitting balance, decreased standing balance, decreased postural control, and decreased balance strategies and therefore will continue to benefit from skilled PT intervention to increase functional independence with mobility.  Patient progressing toward long term goals..  Plan of care revisions: upgraded transfer goal to Supervision.  PT Short Term Goals Week 3:  PT Short Term Goal 1 (Week 3): Pt will perform bed mobility with min A consistently PT Short Term Goal 1 - Progress (Week 3): Progressing toward goal PT Short Term Goal 2 (Week 3): Pt will perform least restrictive transfer with min A consistently PT Short Term Goal 2 - Progress (Week 3): Met PT Short Term Goal 3 (Week 3): Pt will initiate  w/c mobility in ultra lightweight wheelchair PT Short Term Goal 3 - Progress (Week 3): Met PT Short Term Goal 4 (Week 3): Pt will maintain sitting balance x 5 min with Supervision PT Short Term Goal 4 - Progress (Week 3): Met Week 4:  PT Short Term Goal 1 (Week 4): =LTG due to ELOS  Therapy Documentation Precautions:  Precautions Precautions: Cervical, Fall Precaution Comments: reviewed precautions Required Braces or Orthoses: Cervical Brace Cervical Brace: Hard collar, Other (comment) (donned in sitting) Restrictions Weight Bearing Restrictions: No   Therapy/Group: Individual Therapy   [name], PT, DPT, CSRS 07/26/2020, 7:55 AM

## 2020-07-27 NOTE — Progress Notes (Signed)
PROGRESS NOTE   Subjective/Complaints:  NO cath since Monday.   Doesn't want SSRI- found out was taking yesterday and is refusing it.  Afib was from hyperthyroid- said was "getting better as thyroid treated".  Pt is NOT receiving Metoprolol most of time due to bradycardia and low BP.  Called Cards to evaluate- will d/c metoprolol- waiting for Cards.    ROS:   Pt denies SOB, abd pain, CP, N/V/C/D, and vision changes  Objective:   No results found. Recent Labs    07/25/20 0630  WBC 4.0  HGB 10.9*  HCT 33.0*  PLT 157    Recent Labs    07/25/20 0630  NA 137  K 4.2  CL 102  CO2 30  GLUCOSE 92  BUN 14  CREATININE 0.65  CALCIUM 8.6*     Intake/Output Summary (Last 24 hours) at 07/27/2020 0835 Last data filed at 07/27/2020 0757 Gross per 24 hour  Intake 1200 ml  Output 1311 ml  Net -111 ml         Physical Exam: Vital Signs Blood pressure 123/87, pulse 62, temperature 97.8 F (36.6 C), resp. rate 19, height 5\' 10"  (1.778 m), weight 74.8 kg, SpO2 100 %.    General: awake, alert, appropriate, sitting up in bed; NAD HENT: conjugate gaze; oropharynx moist CV: regular rate and rhythm on exam; no JVD Pulmonary: CTA B/L; no W/R/R- good air movement GI: soft, NT, ND, (+)BS Psychiatric: appropriate; interactive- refusing SSRI Neurological: Ox3 MAS of 1+ in Les- no spasms seen today-  Musculoskeletal:  R knee- painful- a lot of crepitus- TTP and pain with ROM       General: TTP over R shoulder- over biceps tendon as well as R GH joint and AC joint still TTP; crepitus with ROM     Comments: RUE- biceps 3/5, WE 4-/5, triceps 3/5, grip 3+/5, and FA 1/5- developing muscle atrophy in RUE as well as in teres and upper traps on R>L LUE- all muscles 4+/5 RLE- HF 3-/5, KE 3+/5, DF 3-/5, and PF 2-/5, LLE- HF 4+/5, KE 5-/5, DF 4/5 and PF 2+/5;  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 Still having a few spasms/extensor tone-  Sensation: RUE C4 ok- decreased from C5 to S1 on R LUE- C5 OK- decreased from C6 to S1 on L    Assessment/Plan: 1. Functional deficits which require 3+ hours per day of interdisciplinary therapy in a comprehensive inpatient rehab setting. Physiatrist is providing close team supervision and 24 hour management of active medical problems listed below. Physiatrist and rehab team continue to assess barriers to discharge/monitor patient progress toward functional and medical goals  Care Tool:  Bathing  Bathing activity did not occur: Refused Body  parts bathed by patient: Right arm, Left arm, Chest, Abdomen, Front perineal area, Right upper leg, Left upper leg, Face, Right lower leg, Left lower leg   Body parts bathed by helper: Buttocks     Bathing assist Assist Level: Minimal Assistance - Patient > 75%     Upper Body Dressing/Undressing Upper body dressing   What is the patient wearing?: Pull over shirt, Orthosis    Upper body assist Assist Level: Minimal Assistance - Patient > 75%    Lower Body Dressing/Undressing Lower body dressing      What is the patient wearing?: Pants     Lower body assist Assist for lower body dressing: Minimal Assistance - Patient > 75%     Toileting Toileting    Toileting assist Assist for toileting: Dependent - Patient 0%     Transfers Chair/bed transfer  Transfers assist     Chair/bed transfer assist level: Contact Guard/Touching assist (slide board)     Locomotion Ambulation   Ambulation assist    Ambulation activity did not occur: Safety/medical concerns  Assist level: Dependent - Patient 0% Assistive device: Lite Gait Max distance: 200'   Walk 10 feet activity   Assist  Walk 10 feet activity did not occur: Safety/medical concerns  Assist level: Dependent - Patient 0% Assistive device: Lite Gait   Walk 50 feet activity   Assist Walk 50 feet with 2 turns activity did not occur: Safety/medical concerns  Assist level: Dependent - Patient 0% Assistive device: Lite Gait    Walk 150 feet activity   Assist Walk 150 feet activity did not occur: Safety/medical concerns  Assist level: Dependent - Patient 0% Assistive device: Lite Gait    Walk 10 feet on uneven surface  activity   Assist Walk 10 feet on uneven surfaces activity did not occur: Safety/medical concerns         Wheelchair     Assist Will patient use wheelchair at discharge?: Yes Type of Wheelchair: Manual    Wheelchair assist level: Supervision/Verbal cueing Max wheelchair distance: 150'    Wheelchair 50 feet with 2 turns activity    Assist        Assist Level: Supervision/Verbal cueing   Wheelchair 150 feet activity     Assist      Assist Level: Supervision/Verbal cueing   Blood pressure 123/87, pulse 62, temperature 97.8 F (36.6 C), resp. rate 19, height 5\' 10"  (1.778 m), weight 74.8 kg, SpO2 100 %.  Medical Problem List and Plan: 1. 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  Status post C3-4 laminectomy with posterior arthrodesis cervical instrumentation 06/29/2020.  Cervical brace as directed.             -patient may  Shower with neck brace/incision covered             -ELOS/Goals: ~3 weeks- min A- team conf in am  -will order PRAFOs and have pt wear nightly to prevent ankle contractures  5/26- con't PT and OT- spent 20 minutes going over ASIA level, and info about SCI including neurogenic bowel and bladder, spasticity and  increased risk of DVT/pressure ulcers.   --con't PT and OT- discussed at length with pt goal is Min A- if so, will need 24/7 A at home- I don't think he'll get to Mod I (explained this)- made agreement if pt does, will reward with singing! But think this is unlikely- I would think about short term SNF-   Con't PT and OT-  pt willing IF need be, to go to SNF, but wants to see if can get good enough to go home- is progressing faster than expected as of yesterday- made some great gains- con't regimen  Con't PT and OT- working on transfers and gait- team conference today- d/c date 6/22.   Con't CIR_ PT and OT- explained that if needs more time/to get home- can ask for a little more time- if for sure going to SNF or going home, no chang ein d/c date.  2.  Antithrombotics: -DVT/anticoagulation: Lovenox.  Vascular study reviewed and negative.  5/26- will need for at least 2 months- maybe 3 months total from surgery. 6/3- will need for a total of 3 months  3. Pain Management: Baclofen 5 mg 3 times daily, oxycodone and Flexeril as needed  5/26- pain pretty well controlled- con't regimen  5/28-   con't regimen prn  5/31- will do R shoulder injection today.  6/3- Shoulder injection 5/31- R shoulder pain almost gone with injection   6/7- Added tramadol- got rid of 10 mg Oxy-   6/10- taking tramadol- works for R >L knee, but shoulder now acting up again- will get xray and go from there- con't regimen  6/11- tramadol- not really taking oxy- con't regimen- add voltaren gel QID.   6/12- voltaren helped some- con't regimen 4. Mood: Provide emotional support  5/26- pt has been tearful understandably- will try Celexa 10 mg daily  5/31- tolerating, will increase to 20 mg daily  6/6- happy with improving results, so brighter- con't regimen  6/15- pt insisting to d/c Celexa- will discontinue.              -antipsychotic agents: N/A 5. Neuropsych: This patient is capable of making decisions on his own behalf. 6.  Skin/Wound Care: Routine skin checks 7. Fluids/Electrolytes/Nutrition: Routine in and outs with follow-up chemistries 8.  Atrial fibrillation.  Lopressor 25 mg twice daily.  Cardiac rate controlled  5/28 overall controlled- con't regimen  5/31- will decrease Metoprolol to 12.5 mg daily (was qday) since heart rate on low side and BP low- don't want him orthostatic due to SCI.   6/10- HR 60 this AM- holding metoprolol more than giving. Likely worse due to SCI bradycardia- if doesn't improve, will call Cards next week.   6/14- still holding metoprolol frequently due to bradycardia.   6/15- called Cards- haven't come by yet- will stop Metoprolol and monitor 9.  Hypertension with orthostatic hypotension.  Cozaar 50 mg daily.  Monitor with increased mobility  5/28 elevated today.  5/30- BP 90s/50s- will monitor for orthostatic hypotension  5/31- will decrease metoprolol as above  6/8- per pt , no dizziness, however BP low- will stop Losartan and con't metoprolol since for rate control.   6/10- BP soft again 90/s50s- off losartan- only on Metoprolol lowest dose- will call Cards next week if doesn't improve.   6/12- HR 67- con't regimen Vitals:   07/26/20 1926 07/27/20 0456  BP: 107/76 123/87  Pulse: 68 62  Resp: 17 19  Temp: 98.3 F (36.8 C) 97.8 F (36.6 C)  SpO2: 99% 100%   Asymptomatic brady , BP in range to be expected given SCI - no dizziness with ambulation today  10.  BPH/UTI.  Was on Flomax 0.4 mg daily.   UTI greater than 80,000 Staphylococcus/Enterococcus completing course of Bactrim- please see neurogenic bladder-  11.  Surgical PCR screening positive.  Bactroban as directed 12. Hyperthyroidism- had lost 70 lbs in 6  months- in middle of treatment  6/1- per Endo, want labs checked before appointment 6/7- will order thyroid labs for tomorrow, so they are back for appointment.   6/2- TSH <0.010 and free T4 1.03- free T3 pending.   6/3- pt to have endo phone appt 6/9- asked him to let  therapy know to schedule therapy around it.   6/7- has appt today per pt- will let me know if there's any changes!  6/8- allowed to stop Prednisone and will wait on Synthroid for today.   6/14- stopping Protonix since pt doesn't need- was since previous Prednisone dose that's stopped.  13. Fatty liver-will need to be careful with any medicine going through liver. Placed nursing order to request that protein shake recommended by his hepatologist be given at night  6/7- explained to pt that tylenol can affect liver, not the other way around.  14. Neurogenic bladder- will start on I/o caths q6 hours prn and increase Flomax to 0.8 mg with supper  6/7- will change order to cath if volumes >250cc  6/10- will call Urology for advise- is usually requiring caths still- but voiding some- don't want pt to get Foley (if goes to SNF).   6/12- hasn't required caths for 24 hours- needed to double void x1, but sounding great- con't regimen 6/13 ICP x 1 on 6/12 otherwise voiding with low PVRs 6/15- no caths since Monday- might be able to go home not cathing, but will send home with cath supplies- at least 1 month supply because occ needing caths.   15.  Neurogenic bowel- doesn't have good control- will start with suppository- and dig stim and see if can withdraw any of them over time.   6/2- Pt having good results with bowel program- still requiring some dig stim, but less.   6/5: add miralax mixed in prune juice to dinner.   6/6- BM on toilet with dig stim/supp- improving!- con't regimen  Last BM on 6/12 cont bowel program, daily dulc supp 16. Spasticity- will increase Baclofen to 10 mg TID and con't Flexeril.   D/c'd flexeril  5/31- will d/w pt going up on baclofen cannot do zanaflex due to low BP and cannot do dantrolene due to liver issues.  6/14- spasms getting a little worse- wait to increase Baclofen since don't have much more to go up without side effects.   17. Orthostatic hypotension  5/26- added TEDs  and Abd binder during day.  5/31- decreased metoprolol   6/8- will stop Losartan 18. Ileus     Resolved tolerates Regular diet 19. R shoulder pain  5/30- will try to do R shoulder injection tomorrow 5/31- R shoulder injections for subacromial and RTC/posterior shoulder injection today  6/2-6/5 pain MUCH better- continue regimen 5/31-received steroid injection.   6/10- will get shoulder xray as above- and if need be, MRI of R shoulder since pain has come back.   6/11- xray shows likely RTC injury and significant DJD of R shoulder- GH and AC- will start Voltaren gel QID.  20. Nausea: resolved: changed valium and reglan to PRN 21. R>L knee pain  6/7- added tramadol 50 mg q6 hours prn and stopped Oxycodone 10 mg- take oxy only if severe pain; try Tramadol, not tlyenol when hurting BEFORE therapy.  6/10- doing well with tramadol c-on't regimen 22. Dispo  6/8- spent a long time going over that we, as a team, think he would benefit from more time at SNF- for 1-2 months, not long term- OT  offered to speak with pt about this.   6/11- d/w pt and wife who were in room- will see how he does this next week.   6/14- team conference today to discuss progress.   6/15- d/c 6/22- for now, but will see how doing next week    LOS: 22 days A FACE TO FACE EVALUATION WAS PERFORMED  Jacquel Redditt 07/27/2020, 8:35 AM

## 2020-07-27 NOTE — Progress Notes (Signed)
Physical Therapy Session Note  Patient Details  Name: Darren Harrison MRN: [id number] Date of Birth: 1956/01/13  Today's Date: 07/27/2020 PT Individual Time: 1415-1530; 1630-1700 PT Individual Time Calculation (min): 75 min and 30 min  Short Term Goals: Week 4:  PT Short Term Goal 1 (Week 4): =LTG due to ELOS   Skilled Therapeutic Interventions/Progress Updates:    Session 1: Pt received seated in w/c in room, agreeable to PT session. No complaints of pain. Manual w/c propulsion at mod I level to/from therapy gym. Pt requires assist for management of w/c parts. Sit to stand in // bars with max to total A with B knees blocked. Ambulation 3 x 8 ft in // bars with max A with knees blocked and assist needed for balance, close w/c follow for safety. Pt has one instance of R knee buckling with controlled descent to w/c. Discussion with patient and his wife regarding current level of function and level patient will need to achieve in order to d/c home safely. Discussed that current concerns are patient not being independent with transfers in/out of w/c and with toileting. Remainder of session focus on doffing/donning pants while seated in w/c in order to use urinal while seated in w/c and slide board transfers. Pt is able to doff and don pants while seated in w/c with setup A parked next to bed with arm rest of w/c removed via lateral leaning. However, pt requires extended time to perform clothing management, not practical for real world situations with toileting at this point in time. Slide board transfer w/c to/from level surface of bed at Supervision level. Pt returned to supine at Supervision level. Pt left seated in bed with needs in reach, wife present.  Session 2: Pt received seated in bed, agreeable to PT session. No complaints of pain. Supine to sit with Supervision with use of bedrail. Slide board transfer to w/c with CGA for balance. Manual w/c propulsion at mod I level, assist required for  management of w/c parts. Slide board transfer to Nust[place] with min A. Nustep level 4 x 10 min with use of B UE/LE for global endurance training. Pt rates exercise at 11/20 on Borg scale. Slide board transfer back to w/c with min A, back to bed with CGA. Pt returned to supine with Supervision. Placed hot pack on lower back for pain management. Pt with no adverse affects following previous hot pack use. Pt instructed to remove hot pack after 15 min. Pt left seated in bed with needs in reach, bed alarm in place, wife present.  Therapy Documentation Precautions:  Precautions Precautions: Cervical, Fall Precaution Comments: reviewed precautions Required Braces or Orthoses: Cervical Brace Cervical Brace: Hard collar, Other (comment) (donned in sitting) Restrictions Weight Bearing Restrictions: No      Therapy/Group: Individual Therapy   [name], PT, DPT, CSRS  07/27/2020, 5:09 PM

## 2020-07-27 NOTE — Progress Notes (Signed)
Physical Therapy Session Note  Patient Details  Name: Darren Harrison MRN: [id number] Date of Birth: [birth year]  Today's Date: 07/27/2020 PT Individual Time: 0355-9741 PT Individual Time Calculation (min): 45 min   Short Term Goals: Week 4:  PT Short Term Goal 1 (Week 4): =LTG due to ELOS  Skilled Therapeutic Interventions/Progress Updates:    Pt greeted supine in bed to start PT tx - no reports of pain. Donned knee-high TED's with totalA while he lay in bed. Able to complete supine<>sit with supervision with use of bed features. While seated EOB, he was able to don tennis shoes with setupA via figure-4 technique. He required assist for donning cervical collar but able to direct care appropriately. Pt performed SB transfer from EOB to w/c with minA - good understanding of board placement and skin protection with shorts - he did need assistance of stabilizing w/c as it began to slide away from him during the transfer. He propelled himself throughout session with supervision with BUE's in w/c on level ground. Wheeled in // bars and completed x3 sit<>stands with maxA with R knee block - difficulty producing power to rise. Once standing, is able to stand with modA for balance but forward flexed trunk noted. He was able to complete lateral weight shifts L<>R but instances of B knee buckling noted with abrupt sitting back into his w/c with inability to correct. Wheeled to ortho gym where he was set up in the standing frame. Completed x2 stands in frame with totalA (cues for initiating standing). In standing, worked on functional reaching with cone stacking, using both Ue's. He also worked on [facility] with stacking coins using both upper extremities as well. Pt propelled himself back to his room with supervision in his w/c and agreeable to remain sitting up in chair for upcoming OT session. All needs within reach at conclusion of session.  Therapy Documentation Precautions:  Precautions Precautions: Cervical,  Fall Precaution Comments: reviewed precautions Required Braces or Orthoses: Cervical Brace Cervical Brace: Hard collar, Other (comment) (donned in sitting) Restrictions Weight Bearing Restrictions: No General:    Therapy/Group: Individual Therapy  Alger Simons 07/27/2020, 7:36 AM

## 2020-07-27 NOTE — Progress Notes (Signed)
Occupational Therapy Session Note  Patient Details  Name: Darren Harrison MRN: [id number] Date of Birth: Nov 04, 1955  Today's Date: 07/27/2020 OT Individual Time: 1000-1056 OT Individual Time Calculation (min): 56 min    Short Term Goals: Week 1:  OT Short Term Goal 1 (Week 1): Pt will perform self feeding with Min A with AE PRN OT Short Term Goal 2 (Week 1): Pt will improve trunk control/sitting balance to sit EOB/EOM for 5 mins with no more than Min A OT Short Term Goal 3 (Week 1): Pt will complete sit <> stands in [place] with Max of 1 Week 2:  OT Short Term Goal 1 (Week 2): Pt will perform self feeding with Min A with AE PRN OT Short Term Goal 1 - Progress (Week 2): Met OT Short Term Goal 2 (Week 2): Pt will improve trunk control/sitting balance to sit EOB/EOM for 5 mins with no more than Min A OT Short Term Goal 2 - Progress (Week 2): Met OT Short Term Goal 3 (Week 2): Pt will complete sit <> stands in [place] with Max of 1 OT Short Term Goal 3 - Progress (Week 2): Met Week 3:  OT Short Term Goal 1 (Week 3): Pt will perform UB dressing with min A seated in w/c OT Short Term Goal 2 (Week 3): Pt will complete LB dressing with mod A at bed level OT Short Term Goal 2 - Progress (Week 3): Met OT Short Term Goal 3 (Week 3): Pt will perform SB transfers with min A OT Short Term Goal 3 - Progress (Week 3): Met OT Short Term Goal 4 (Week 3): Pt will complete sit<>stand in [place] with min A OT Short Term Goal 4 - Progress (Week 3): Progressing toward goal  Skilled Therapeutic Interventions/Progress Updates:    Pt received up in w/c and consented to OT tx. Pt wheeled down to day room with mod I. Instructed in yellow theraputty HEP, therapist added beads to putty to increase fine motor challenge to improve [facility] skills and strengthen hands and fingers for ADLs. Pt then instructed in STS in [place], req max A. Pt transferred via steady to EOM with wedge placed underneath him to facilitate anterior pelvic  tilt as pt demo's sacral sitting position while in w/c. Pt tolerated position well, reports decrease in lower back pain with properly aligned pelvis. While sitting unsupported, pt instructed in large peg/pegboard activity to increase fine and gross motor coordination and core strength with proper pelvic alignment. Pt then required max A to stand back in steady and transfer to w/c at close of session. Pt demo's decreased posterior pelvic tilt now when sitting in w/c, reports decreased pain. After tx, pt left up in w/c with all needs met.   Therapy Documentation Precautions:  Precautions Precautions: Cervical, Fall Precaution Comments: reviewed precautions Required Braces or Orthoses: Cervical Brace Cervical Brace: Hard collar, Other (comment) (donned in sitting) Restrictions Weight Bearing Restrictions: No  Pain: mild lower back pain, pt reported it was much better after session      Therapy/Group: Individual Therapy  Sondos Wolfman 07/27/2020, 10:57 AM

## 2020-07-28 LAB — URINALYSIS, ROUTINE W REFLEX MICROSCOPIC
Bilirubin Urine: NEGATIVE
Glucose, UA: NEGATIVE mg/dL
Hgb urine dipstick: NEGATIVE
Ketones, ur: NEGATIVE mg/dL
Nitrite: POSITIVE — AB
Protein, ur: NEGATIVE mg/dL
Specific Gravity, Urine: 1.01 (ref 1.005–1.030)
pH: 7 (ref 5.0–8.0)

## 2020-07-28 MED ORDER — CYCLOBENZAPRINE HCL 10 MG PO TABS
10.0000 mg | ORAL_TABLET | Freq: Every day | ORAL | Status: DC
  Administered 2020-07-28 – 2020-08-09 (×13): 10 mg via ORAL
  Filled 2020-07-28 (×13): qty 1

## 2020-07-28 NOTE — Progress Notes (Signed)
Physical Therapy Session Note  Patient Details  Name: Darren Harrison MRN: [id number] Date of Birth: 01-Feb-1956  Today's Date: 07/28/2020 PT Individual Time: 0945-1100 PT Individual Time Calculation (min): 75 min   Short Term Goals: Week 1:  PT Short Term Goal 1 (Week 1): Pt will complete bed mobility with assist x 1 consistently PT Short Term Goal 1 - Progress (Week 1): Met PT Short Term Goal 2 (Week 1): Pt will initiate slide board transfers PT Short Term Goal 2 - Progress (Week 1): Met PT Short Term Goal 3 (Week 1): Pt will maintain sitting balance x 5 min with min A PT Short Term Goal 3 - Progress (Week 1): Progressing toward goal Week 2:  PT Short Term Goal 1 (Week 2): Pt will perform bed mobility with mod A consistently PT Short Term Goal 1 - Progress (Week 2): Met PT Short Term Goal 2 (Week 2): Pt will perform least restrictive transfer with assist x 1 PT Short Term Goal 2 - Progress (Week 2): Met PT Short Term Goal 3 (Week 2): Pt will maintain standing with LRAD x 5 min PT Short Term Goal 3 - Progress (Week 2): Met PT Short Term Goal 4 (Week 2): Pt will maintain sitting balance x 5 min with min A PT Short Term Goal 4 - Progress (Week 2): Met Week 3:  PT Short Term Goal 1 (Week 3): Pt will perform bed mobility with min A consistently PT Short Term Goal 1 - Progress (Week 3): Progressing toward goal PT Short Term Goal 2 (Week 3): Pt will perform least restrictive transfer with min A consistently PT Short Term Goal 2 - Progress (Week 3): Met PT Short Term Goal 3 (Week 3): Pt will initiate w/c mobility in ultra lightweight wheelchair PT Short Term Goal 3 - Progress (Week 3): Met PT Short Term Goal 4 (Week 3): Pt will maintain sitting balance x 5 min with Supervision PT Short Term Goal 4 - Progress (Week 3): Met Week 4:  PT Short Term Goal 1 (Week 4): =LTG due to ELOS  Skilled Therapeutic Interventions/Progress Updates:   Pain:  Pt reports 6/10  stabbing low back pain.  See below  for treatment. Treatment to tolerance.  Rest breaks and repositioning as needed.  Pt initially oob in wc and c/o pain.  Requesting pain meds.  Nursing notified and provided.  Pt transported to gym.  Pt able to perform wc to mat w/set up only, able to position boardwithout assist and verbalize steps for transfer.  In sitting, pt c/o severe LBP.  Sit to supine w/max assist for LE assist due to pain.   Attempted stretching , lower trunk rotation, knees to chest but pt continues to increase.  Therapist able to palpate taut band in upper glut medius and performed soft tissue mobilization x 20 min to area followed by moist heat x 10 min and stretching of glutes, piriformis, and hamstrings.  Pt reported decrease in pain to 3/10 following above rx. Supine to sit w/mod assist to prevent strain/return of spasms. Pt transfers mat to wc w/close supervision, set up assist. Propels x 174f mod I Wc to bed w/sliding board and close superivsion only. Sit to supine w/mod assist as above.  Pt reports back pain remains 3/10 and felt STM and stretching provided signficant relief.  MD notified of above issue/response. Pt left supine w/rails up x 4 alarm set, bed in lowest position, and needs in reach. Highly motivated pt, limited today by pain  due to mm spasms/soft tissue restrictions.  Responded well to treatment.   Therapy Documentation Precautions:  Precautions Precautions: Cervical, Fall Precaution Comments: reviewed precautions Required Braces or Orthoses: Cervical Brace Cervical Brace: Hard collar, Other (comment) (donned in sitting) Restrictions Weight Bearing Restrictions: No    Therapy/Group: Individual Therapy Barbara Gallagher, PT   Barbara M Gallagher 07/28/2020, 12:30 PM  

## 2020-07-28 NOTE — Progress Notes (Signed)
Occupational Therapy Session Note  Patient Details  Name: Darren Harrison MRN: [id number] Date of Birth: [birth year]  Today's Date: 07/28/2020 OT Individual Time: 1950-9326 OT Individual Time Calculation (min): 70 min    Short Term Goals: Week 3:  OT Short Term Goal 1 (Week 3): Pt will perform UB dressing with min A seated in w/c OT Short Term Goal 2 (Week 3): Pt will complete LB dressing with mod A at bed level OT Short Term Goal 2 - Progress (Week 3): Met OT Short Term Goal 3 (Week 3): Pt will perform SB transfers with min A OT Short Term Goal 3 - Progress (Week 3): Met OT Short Term Goal 4 (Week 3): Pt will complete sit<>stand in Stedy with min A OT Short Term Goal 4 - Progress (Week 3): Progressing toward goal  Skilled Therapeutic Interventions/Progress Updates:    Pt resting in bed upon arrival. Pt donned pants at bed level this morning with min A. Pt c/o increased low back pain on Lt side. Pain increased with transfers. SB transfers with supervision including SB placement. Pt practiced DABSC tranfsers with CGA. Pt c/o increased pain and transferred to mat on Rt side. Soft tissue mobilization medial to QL and proximal to gluteal medius with some relief. Pt returned to w/c and propelled to room. Pt remained in w/c with all needs within reach.   Therapy Documentation Precautions:  Precautions Precautions: Cervical, Fall Precaution Comments: reviewed precautions Required Braces or Orthoses: Cervical Brace Cervical Brace: Hard collar, Other (comment) (donned in sitting) Restrictions Weight Bearing Restrictions: No  Pain: Pain Assessment Pain Scale: 0-10 Pain Score: 6  Pain Type: Acute pain Pain Location: Back Pain Orientation: Lower Pain Intervention(s): soft tissue mobilization/massage and stretching   Therapy/Group: Individual Therapy  [name] 07/28/2020, 9:36 AM

## 2020-07-28 NOTE — Progress Notes (Signed)
PROGRESS NOTE   Subjective/Complaints:  No cathing since Monday- but is still requiring bowel program nightly.  Has a lot of back pain this AM- when d/w PT_ sounds like actually has a major trigger point in his low back-  Knees less painful- voltaren working for R shoulder pain.   Told by SW need to do peer to peer to keep pt here til 6/22- have placed call and L/M- haven't heard back. Spoke to insurance- they are oK with staying til 6/22- and if we need to keep longer, we just need to have SW send in to get more days- they are OK with that and would very likely get approved.    ROS:   Pt denies SOB, abd pain, CP, N/V/C/D, and vision changes   Objective:   No results found. No results for input(s): WBC, HGB, HCT, PLT in the last 72 hours.   No results for input(s): NA, K, CL, CO2, GLUCOSE, BUN, CREATININE, CALCIUM in the last 72 hours.    Intake/Output Summary (Last 24 hours) at 07/28/2020 1223 Last data filed at 07/28/2020 1137 Gross per 24 hour  Intake 1320 ml  Output 2030 ml  Net -710 ml         Physical Exam: Vital Signs Blood pressure 109/76, pulse 67, temperature 98 F (36.7 C), resp. rate 18, height 5\' 10"  (1.778 m), weight 74.8 kg, SpO2 97 %.     General: awake, alert, appropriate, NAD HENT: conjugate gaze; oropharynx moist CV: regular rate; no JVD Pulmonary: CTA B/L; no W/R/R- good air movement GI: soft, NT, ND, (+)BS Psychiatric: appropriate Neurological: Ox3  Musculoskeletal: very painful trigger point in low back- TTP on lumbar L paraspinals.  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 Still having a few spasms/extensor tone-  Sensation: RUE C4 ok- decreased from C5 to S1 on R LUE- C5 OK- decreased from C6 to S1 on L    Assessment/Plan: 1. Functional deficits which require 3+ hours per day of interdisciplinary therapy in a comprehensive inpatient rehab setting. Physiatrist is providing close team supervision and 24 hour management of active medical problems listed below. Physiatrist and rehab team  continue to assess barriers to discharge/monitor patient progress toward functional and medical goals  Care Tool:  Bathing  Bathing activity did not occur: Refused Body parts bathed by patient: Right arm, Left arm, Chest, Abdomen, Front perineal area, Right upper leg, Left upper leg, Face, Right lower leg, Left lower leg   Body parts bathed by helper: Buttocks     Bathing assist Assist Level: Minimal Assistance - Patient > 75%     Upper Body Dressing/Undressing Upper body dressing   What is the patient wearing?: Pull over shirt, Orthosis    Upper body assist Assist Level: Minimal Assistance - Patient > 75%    Lower Body Dressing/Undressing Lower body dressing      What is the patient wearing?: Pants     Lower body assist Assist for lower body dressing: Minimal Assistance - Patient > 75%     Toileting Toileting    Toileting assist Assist for toileting: Dependent - Patient 0%     Transfers Chair/bed transfer  Transfers  assist     Chair/bed transfer assist level: Contact Guard/Touching assist (slide board)     Locomotion Ambulation   Ambulation assist   Ambulation activity did not occur: Safety/medical concerns  Assist level: Dependent - Patient 0% Assistive device: Lite Gait Max distance: 200'   Walk 10 feet activity   Assist  Walk 10 feet activity did not occur: Safety/medical concerns  Assist level: Dependent - Patient 0% Assistive device: Lite Gait   Walk 50 feet activity   Assist Walk 50 feet with 2 turns activity did not occur: Safety/medical concerns  Assist level: Dependent - Patient 0% Assistive device: Lite Gait    Walk 150 feet activity   Assist Walk 150 feet activity did not occur: Safety/medical concerns  Assist level: Dependent - Patient 0% Assistive device: Lite Gait    Walk 10 feet on uneven surface  activity   Assist Walk 10 feet on uneven surfaces activity did not occur: Safety/medical concerns         Wheelchair     Assist Will patient use wheelchair at discharge?: Yes Type of Wheelchair: Manual    Wheelchair assist level: Supervision/Verbal cueing Max wheelchair distance: 150'    Wheelchair 50 feet with 2 turns activity    Assist        Assist Level: Supervision/Verbal cueing   Wheelchair 150 feet activity     Assist      Assist Level: Supervision/Verbal cueing   Blood pressure 109/76, pulse 67, temperature 98 F (36.7 C), resp. rate 18, height 5\' 10"  (1.778 m), weight 74.8 kg, SpO2 97 %.  Medical Problem List and Plan: 1. 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  Status post C3-4 laminectomy with posterior arthrodesis cervical instrumentation 06/29/2020.  Cervical brace as directed.             -patient may  Shower with neck brace/incision covered             -ELOS/Goals: ~3 weeks- min A- -will order PRAFOs and have pt wear nightly to prevent ankle contractures  5/26- con't PT  and OT- spent 20 minutes going over ASIA level, and info about SCI including neurogenic bowel and bladder, spasticity and increased risk of DVT/pressure ulcers.   --con't PT and OT- discussed at length with pt goal is Min A- if so, will need 24/7 A at home- I don't think he'll get to Mod I (explained this)- made agreement if pt does, will  reward with singing! But think this is unlikely- I would think about short term SNF-  6/16- Continue CIR- PT, OT and SLP  2.  Antithrombotics: -DVT/anticoagulation: Lovenox.  Vascular study reviewed and negative.  5/26- will need for at least 2 months- maybe 3 months total from surgery. 6/3- will need for a total of 3 months  3. Pain Management: Baclofen 5 mg 3 times daily, oxycodone and Flexeril as needed  5/26- pain pretty well controlled- con't regimen  5/28-   con't regimen prn  5/31- will do R shoulder injection today.  6/3- Shoulder injection 5/31- R shoulder pain almost gone with injection   6/7- Added tramadol- got rid of 10 mg Oxy-   6/10- taking tramadol- works for R >L knee, but shoulder now acting up again- will get xray and go from there- con't regimen  6/11- tramadol- not really taking oxy- con't regimen- add voltaren gel QID.   6/12- voltaren helped some- con't regimen 4. Mood: Provide emotional support  5/26- pt has been tearful understandably- will try Celexa 10 mg daily  5/31- tolerating, will increase to 20 mg daily  6/6- happy with improving results, so brighter- con't regimen  6/15- pt insisting to d/c Celexa- will discontinue.              -antipsychotic agents: N/A 5. Neuropsych: This patient is capable of making decisions on his own behalf. 6. Skin/Wound Care: Routine skin checks 7. Fluids/Electrolytes/Nutrition: Routine in and outs with follow-up chemistries 8.  Atrial fibrillation.  Lopressor 25 mg twice daily.  Cardiac rate controlled  5/28 overall controlled- con't regimen  5/31- will decrease Metoprolol to 12.5 mg daily (was  qday) since heart rate on low side and BP low- don't want him orthostatic due to SCI.   6/10- HR 60 this AM- holding metoprolol more than giving. Likely worse due to SCI bradycardia- if doesn't improve, will call Cards next week.   6/14- still holding metoprolol frequently due to bradycardia.   6/15- called Cards- haven't come by yet- will stop Metoprolol and monitor 9.  Hypertension with orthostatic hypotension.  Cozaar 50 mg daily.  Monitor with increased mobility  5/28 elevated today.  5/30- BP 90s/50s- will monitor for orthostatic hypotension  5/31- will decrease metoprolol as above  6/8- per pt , no dizziness, however BP low- will stop Losartan and con't metoprolol since for rate control.   6/10- BP soft again 90/s50s- off losartan- only on Metoprolol lowest dose- will call Cards next week if doesn't improve.   6/12- HR 67- con't regimen Vitals:   07/27/20 1921 07/28/20 0456  BP: 104/64 109/76  Pulse: 86 67  Resp: 17 18  Temp: 98.3 F (36.8 C) 98 F (36.7 C)  SpO2: 98% 97%   6- bradycardia doing MUCH better- con't current regimen 10.  BPH/UTI.  Was on Flomax 0.4 mg daily.   UTI greater than 80,000 Staphylococcus/Enterococcus completing course of Bactrim- please see neurogenic bladder-  11.  Surgical PCR screening positive.  Bactroban as directed 12. Hyperthyroidism- had lost 70 lbs in 6 months- in middle of treatment  6/1- per Endo, want labs checked before appointment 6/7- will order thyroid labs for tomorrow, so they are back for appointment.   6/2- TSH <0.010 and free T4 1.03- free T3 pending.   6/3- pt to have endo phone appt 6/9- asked him to let therapy know to schedule therapy around it.   6/7- has appt today per pt- will let me know if there's  any changes!  6/8- allowed to stop Prednisone and will wait on Synthroid for today.   6/14- stopping Protonix since pt doesn't need- was since previous Prednisone dose that's stopped.  13. Fatty liver-will need to be careful with any  medicine going through liver. Placed nursing order to request that protein shake recommended by his hepatologist be given at night  6/7- explained to pt that tylenol can affect liver, not the other way around.  14. Neurogenic bladder- will start on I/o caths q6 hours prn and increase Flomax to 0.8 mg with supper  6/7- will change order to cath if volumes >250cc  6/10- will call Urology for advise- is usually requiring caths still- but voiding some- don't want pt to get Foley (if goes to SNF).  6/16- no cath required since Monday- will might not need to go home with caths.   15.  Neurogenic bowel- doesn't have good control- will start with suppository- and dig stim and see if can withdraw any of them over time.   6/2- Pt having good results with bowel program- still requiring some dig stim, but less.   6/5: add miralax mixed in prune juice to dinner.   6/6- BM on toilet with dig stim/supp- improving!- con't regimen  Last BM on 6/12 cont bowel program, daily dulc supp 16. Spasticity- will increase Baclofen to 10 mg TID and con't Flexeril.   D/c'd flexeril  5/31- will d/w pt going up on baclofen cannot do zanaflex due to low BP and cannot do dantrolene due to liver issues.  6/14- spasms getting a little worse- wait to increase Baclofen since don't have much more to go up without side effects.   17. Orthostatic hypotension  5/26- added TEDs and Abd binder during day.  5/31- decreased metoprolol   6/8- will stop Losartan 18. Ileus     Resolved tolerates Regular diet 19. R shoulder pain  5/30- will try to do R shoulder injection tomorrow 5/31- R shoulder injections for subacromial and RTC/posterior shoulder injection today  6/2-6/5 pain MUCH better- continue regimen 5/31-received steroid injection.   6/10- will get shoulder xray as above- and if need be, MRI of R shoulder since pain has come back.   6/11- xray shows likely RTC injury and significant DJD of R shoulder- GH and AC- will start  Voltaren gel QID.  20. Nausea: resolved: changed valium and reglan to PRN 21. R>L knee pain  6/7- added tramadol 50 mg q6 hours prn and stopped Oxycodone 10 mg- take oxy only if severe pain; try Tramadol, not tlyenol when hurting BEFORE therapy.  6/10- doing well with tramadol c-on't regimen 22. Back pain  6/16- has trigger point - will try therapy- if not, will try trigger point injection 23. Dispo  6/8- spent a long time going over that we, as a team, think he would benefit from more time at SNF- for 1-2 months, not long term- OT offered to speak with pt about this.   6/11- d/w pt and wife who were in room- will see how he does this next week.   6/14- team conference today to discuss progress.   6/15- d/c 6/22- for now, but will see how doing next week  I spent a total of 40 minutes on care today- speaking to PT about trigger point and insurance about pt's care- >50% coordination of care.   LOS: 23 days A FACE TO FACE EVALUATION WAS PERFORMED  Alessia Gonsalez 07/28/2020, 12:23 PM

## 2020-07-28 NOTE — Progress Notes (Signed)
Occupational Therapy Session Note  Patient Details  Name: Darren Harrison MRN: [id number] Date of Birth: August 28, 1955  Today's Date: 07/28/2020 OT Individual Time: 1300-1355 OT Individual Time Calculation (min): 55 min    Short Term Goals: Week 3:  OT Short Term Goal 1 (Week 3): Pt will perform UB dressing with min A seated in w/c OT Short Term Goal 2 (Week 3): Pt will complete LB dressing with mod A at bed level OT Short Term Goal 2 - Progress (Week 3): Met OT Short Term Goal 3 (Week 3): Pt will perform SB transfers with min A OT Short Term Goal 3 - Progress (Week 3): Met OT Short Term Goal 4 (Week 3): Pt will complete sit<>stand in Stedy with min A OT Short Term Goal 4 - Progress (Week 3): Progressing toward goal  Skilled Therapeutic Interventions/Progress Updates:    Pt resting in bed upon arrival and agreeable to therapy. Pt states his back is "feeling better." OT intervention with focus on SB tranfsers and simulated toileting tasks on DABSC. All SB transfers with supervision including placement of SB. Pt practiced clothing mgmt seated EOM using lateral leans. Info provided for wide DABSC in the event insurance will not cover. Pt able to don and doff pants with supervision using lateral leans. Pt returned to bed and remained in bed with all needs within reach. Bed alarm activated.  Therapy Documentation Precautions:  Precautions Precautions: Cervical, Fall Precaution Comments: reviewed precautions Required Braces or Orthoses: Cervical Brace Cervical Brace: Hard collar, Other (comment) (donned in sitting) Restrictions Weight Bearing Restrictions: No Pain: Pain Assessment Pain Scale: 0-10 Pain Score: 4 escalating to 6 with activity Faces Pain Scale: No hurt Pain Type: Acute pain Pain Location: Back Pain Orientation: Lower Pain Radiating Towards: generalized Pain Descriptors / Indicators: Aching;Throbbing Stretching, rest, and repositioning   Therapy/Group: Individual  Therapy  [name] 07/28/2020, 2:08 PM

## 2020-07-28 NOTE — Progress Notes (Signed)
Recreational Therapy Session Note  Patient Details  Name: Darren Harrison MRN: [id number] Date of Birth: May 13, 1955 Today's Date: 07/28/2020  Pain: c/o lower back pain, above sacral area- COTA applied muscle rub and therapeutic massage which assisted with relief, repositioning Skilled Therapeutic Interventions/Progress Updates: LRT discussed discharge planning, pt expectations and family's expectations, current status and discussed coping.  Pt states feelings of optimism in continued progress and is hopeful for extension in LOS allowing him to discharge home vs. SNF.  Pt also recognizes challenges with discharging home and is problem solving through solutions independently.  Therapy/Group: Co-Treatment  Jen Benedict 07/28/2020, 8:19 AM

## 2020-07-29 MED ORDER — LIDOCAINE 5 % EX PTCH
1.0000 | MEDICATED_PATCH | CUTANEOUS | Status: DC
  Administered 2020-07-29 – 2020-08-05 (×8): 1 via TRANSDERMAL
  Filled 2020-07-29 (×8): qty 1

## 2020-07-29 NOTE — Progress Notes (Signed)
PROGRESS NOTE   Subjective/Complaints:  Pt reports no caths still since Monday.  Having BM ~ 1 hour after bowel program- going on toilet.  Back pain is a little better after PT manipulation, but still somewhat bothersome.   Has palpable trigger point injections.  Asking me to fill out handicapped placard.    ROS:   Pt denies SOB, abd pain, CP, N/V/C/D, and vision changes   Objective:   No results found. No results for input(s): WBC, HGB, HCT, PLT in the last 72 hours.   No results for input(s): NA, K, CL, CO2, GLUCOSE, BUN, CREATININE, CALCIUM in the last 72 hours.    Intake/Output Summary (Last 24 hours) at 07/29/2020 1032 Last data filed at 07/29/2020 0811 Gross per 24 hour  Intake 960 ml  Output 2885 ml  Net -1925 ml         Physical Exam: Vital Signs Blood pressure (!) 117/99, pulse (!) 49, temperature 97.7 F (36.5 C), resp. rate 17, height 5\' 10"  (1.778 m), weight 74.8 kg, SpO2 99 %.      General: awake, alert, appropriate,  sitting up in bed; NAD HENT: conjugate gaze; oropharynx moist CV: regular  rhythm; bradycardic rate; no JVD Pulmonary: CTA B/L; no W/R/R- good air movement GI: soft, NT, ND, (+)BS Psychiatric: appropriate Neurological: Ox3- MAS of 1+ in LE's Musculoskeletal:  - TTP on lumbar R paraspinals with painful, palpable trigger point.  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 Still having a few spasms/extensor tone-  Sensation: RUE C4 ok- decreased from C5 to S1 on R LUE- C5 OK- decreased from C6 to S1 on L    Assessment/Plan: 1. Functional deficits which require 3+ hours per day of interdisciplinary therapy in a comprehensive inpatient rehab setting. Physiatrist is providing close team supervision and 24 hour management of active medical problems listed below. Physiatrist and rehab team continue to assess barriers to discharge/monitor patient progress toward functional and medical goals  Care Tool:  Bathing  Bathing activity did not occur: Refused Body parts bathed by patient: Right arm, Left arm, Chest, Abdomen, Front perineal area, Right upper leg, Left upper leg, Face, Right  lower leg, Left lower leg   Body parts bathed by helper: Buttocks     Bathing assist Assist Level: Minimal Assistance - Patient > 75%     Upper Body Dressing/Undressing Upper body dressing   What is the patient wearing?: Pull over shirt, Orthosis    Upper body assist Assist Level: Minimal Assistance - Patient > 75%    Lower Body Dressing/Undressing Lower body dressing      What is the patient wearing?: Pants     Lower body assist Assist for lower body dressing: Minimal Assistance - Patient > 75%     Toileting Toileting    Toileting assist Assist for toileting: Dependent - Patient 0%     Transfers Chair/bed transfer  Transfers assist     Chair/bed transfer assist level: Contact Guard/Touching assist (slide board)     Locomotion Ambulation   Ambulation assist   Ambulation activity did not occur: Safety/medical  concerns  Assist level: Dependent - Patient 0% Assistive device: Lite Gait Max distance: 200'   Walk 10 feet activity   Assist  Walk 10 feet activity did not occur: Safety/medical concerns  Assist level: Dependent - Patient 0% Assistive device: Lite Gait   Walk 50 feet activity   Assist Walk 50 feet with 2 turns activity did not occur: Safety/medical concerns  Assist level: Dependent - Patient 0% Assistive device: Lite Gait    Walk 150 feet activity   Assist Walk 150 feet activity did not occur: Safety/medical concerns  Assist level: Dependent - Patient 0% Assistive device: Lite Gait    Walk 10 feet on uneven surface  activity   Assist Walk 10 feet on uneven surfaces activity did not occur: Safety/medical concerns         Wheelchair     Assist Will patient use wheelchair at discharge?: Yes Type of Wheelchair: Manual    Wheelchair assist level: Supervision/Verbal cueing Max wheelchair distance: 150'    Wheelchair 50 feet with 2 turns activity    Assist        Assist Level: Supervision/Verbal cueing   Wheelchair 150 feet activity     Assist      Assist Level: Supervision/Verbal cueing   Blood pressure (!) 117/99, pulse (!) 49, temperature 97.7 F (36.5 C), resp. rate 17, height 5\' 10"  (1.778 m), weight 74.8 kg, SpO2 99 %.  Medical Problem List and Plan: 1. 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  Status post C3-4 laminectomy with posterior arthrodesis cervical instrumentation 06/29/2020.  Cervical brace as directed.             -patient may  Shower with neck brace/incision covered             -ELOS/Goals: ~3 weeks- min A- -will order PRAFOs and have pt wear nightly to prevent ankle contractures  5/26- con't PT and OT- spent 20 minutes going over ASIA level, and info about SCI including neurogenic bowel and bladder, spasticity and increased risk of DVT/pressure ulcers.   --con't PT and OT-  discussed at length with pt goal is Min A- if so, will need 24/7 A at home- I don't think he'll get to Mod I (explained this)- made agreement if pt does, will reward with singing! But think this is unlikely- I would think about short term SNF-  6/17- Continue CIR- PT, OT   2.  Antithrombotics: -DVT/anticoagulation: Lovenox.  Vascular study reviewed and negative.  5/26- will need for at least 2 months- maybe 3  months total from surgery. 6/3- will need for a total of 3 months  3. Pain Management: Baclofen 5 mg 3 times daily, oxycodone and Flexeril as needed  5/26- pain pretty well controlled- con't regimen  5/28-   con't regimen prn  5/31- will do R shoulder injection today.  6/3- Shoulder injection 5/31- R shoulder pain almost gone with injection   6/7- Added tramadol- got rid of 10 mg Oxy-   6/10- taking tramadol- works for R >L knee, but shoulder now acting up again- will get xray and go from there- con't regimen  6/11- tramadol- not really taking oxy- con't regimen- add voltaren gel QID.   6/12- voltaren helped some- con't regimen  6/17- will add Lidoderm 1 patch 6pm to 6am-  4. Mood: Provide emotional support  5/26- pt has been tearful understandably- will try Celexa 10 mg daily  5/31- tolerating, will increase to 20 mg daily  6/6- happy with improving results, so brighter- con't regimen  6/15- pt insisting to d/c Celexa- will discontinue.              -antipsychotic agents: N/A 5. Neuropsych: This patient is capable of making decisions on his own behalf. 6. Skin/Wound Care: Routine skin checks 7. Fluids/Electrolytes/Nutrition: Routine in and outs with follow-up chemistries 8.  Atrial fibrillation.  Lopressor 25 mg twice daily.  Cardiac rate controlled  5/28 overall controlled- con't regimen  5/31- will decrease Metoprolol to 12.5 mg daily (was qday) since heart rate on low side and BP low- don't want him orthostatic due to SCI.   6/10- HR 60 this AM- holding metoprolol more than  giving. Likely worse due to SCI bradycardia- if doesn't improve, will call Cards next week.   6/17- HR 49 this AM- due to SCI bradycardia.  9.  Hypertension with orthostatic hypotension.  Cozaar 50 mg daily.  Monitor with increased mobility  5/28 elevated today.  5/30- BP 90s/50s- will monitor for orthostatic hypotension  5/31- will decrease metoprolol as above  6/8- per pt , no dizziness, however BP low- will stop Losartan and con't metoprolol since for rate control.   6/10- BP soft again 90/s50s- off losartan- only on Metoprolol lowest dose- will call Cards next week if doesn't improve.   6/12- HR 67- con't regimen Vitals:   07/28/20 2005 07/29/20 0415  BP: 125/70 (!) 117/99  Pulse: (!) 51 (!) 49  Resp: 17 17  Temp: 98.1 F (36.7 C) 97.7 F (36.5 C)  SpO2: 98% 99%   6/17- bradycardia still an issue- Bpdoing much better 10.  BPH/UTI.  Was on Flomax 0.4 mg daily.   UTI greater than 80,000 Staphylococcus/Enterococcus completing course of Bactrim- please see neurogenic bladder-  11.  Surgical PCR screening positive.  Bactroban as directed 12. Hyperthyroidism- had lost 70 lbs in 6 months- in middle of treatment  6/1- per Endo, want labs checked before appointment 6/7- will order thyroid labs for tomorrow, so they are back for appointment.   6/2- TSH <0.010 and free T4 1.03- free T3 pending.   6/3- pt to have endo phone appt 6/9- asked him to let therapy know to schedule therapy around it.   6/7- has appt today per pt- will let me know if there's any changes!  6/8- allowed to stop Prednisone and will wait on Synthroid for today.   6/14- stopping Protonix since pt doesn't need- was since previous Prednisone dose that's stopped.  13. Fatty liver-will need to be careful with any medicine going through  liver. Placed nursing order to request that protein shake recommended by his hepatologist be given at night  6/7- explained to pt that tylenol can affect liver, not the other way around.  14.  Neurogenic bladder- will start on I/o caths q6 hours prn and increase Flomax to 0.8 mg with supper  6/7- will change order to cath if volumes >250cc  6/10- will call Urology for advise- is usually requiring caths still- but voiding some- don't want pt to get Foley (if goes to SNF).  6/16- no cath required since Monday- will might not need to go home with caths.   15.  Neurogenic bowel- doesn't have good control- will start with suppository- and dig stim and see if can withdraw any of them over time.   6/2- Pt having good results with bowel program- still requiring some dig stim, but less.   6/5: add miralax mixed in prune juice to dinner.   6/6- BM on toilet with dig stim/supp- improving!- con't regimen  Last BM on 6/12 cont bowel program, daily dulc supp 16. Spasticity- will increase Baclofen to 10 mg TID and con't Flexeril.   D/c'd flexeril  5/31- will d/w pt going up on baclofen cannot do zanaflex due to low BP and cannot do dantrolene due to liver issues.  6/14- spasms getting a little worse- wait to increase Baclofen since don't have much more to go up without side effects.   17. Orthostatic hypotension  5/26- added TEDs and Abd binder during day.  5/31- decreased metoprolol   6/8- will stop Losartan 18. Ileus     Resolved tolerates Regular diet 19. R shoulder pain  5/30- will try to do R shoulder injection tomorrow 5/31- R shoulder injections for subacromial and RTC/posterior shoulder injection today  6/2-6/5 pain MUCH better- continue regimen 5/31-received steroid injection.   6/10- will get shoulder xray as above- and if need be, MRI of R shoulder since pain has come back.   6/11- xray shows likely RTC injury and significant DJD of R shoulder- GH and AC- will start Voltaren gel QID.  20. Nausea: resolved: changed valium and reglan to PRN 21. R>L knee pain  6/7- added tramadol 50 mg q6 hours prn and stopped Oxycodone 10 mg- take oxy only if severe pain; try Tramadol, not tlyenol  when hurting BEFORE therapy.  6/10- doing well with tramadol c-on't regimen 22. Back pain  6/16- has trigger point - will try therapy- if not, will try trigger point injection 23. Dispo  6/8- spent a long time going over that we, as a team, think he would benefit from more time at SNF- for 1-2 months, not long term- OT offered to speak with pt about this.   6/11- d/w pt and wife who were in room- will see how he does this next week.   6/14- team conference today to discuss progress.   6/15- d/c 6/22- for now, but will see how doing next week  6/17- set up insurance to cover until 6/22- filled out handicapped placard.    LOS: 24 days A FACE TO FACE EVALUATION WAS PERFORMED  Darren Harrison 07/29/2020, 10:32 AM

## 2020-07-29 NOTE — Progress Notes (Signed)
Occupational Therapy Session Note  Patient Details  Name: Darren Harrison MRN: [id number] Date of Birth: [birth year]  Today's Date: 07/29/2020 OT Individual Time: 4585-9292 OT Individual Time Calculation (min): 28 min    Skilled Therapeutic Interventions/Progress Updates:    Pt greeted in bed, agreeable to tx and ready to go. We donned his c-collar and pt was able to don his shoes with elastic laces while sitting EOB! We celebrated! Worked on dynamic sitting balance and UE NMR while engaging in graded card matching task, reaching and bending outside of base of support and crossing midline. At end of session pt returned to bed, supervision for lateral scooting up towards [place], returning to supine, and boosting himself up. Left him with all needs within reach and bed alarm set.   Therapy Documentation Precautions:  Precautions Precautions: Cervical, Fall Precaution Comments: reviewed precautions Required Braces or Orthoses: Cervical Brace Cervical Brace: Hard collar, Other (comment) (donned in sitting) Restrictions Weight Bearing Restrictions: No  Vital Signs: Therapy Vitals Temp: 98.5 F (36.9 C) Temp Source: Oral Pulse Rate: 79 Resp: 17 BP: 104/68 Patient Position (if appropriate): Lying Oxygen Therapy SpO2: 97 % O2 Device: Room Air Pain: in back, RN in during session to provide pain medicine   ADL: ADL Eating: Not assessed Grooming: Not assessed Upper Body Bathing: Maximal assistance Lower Body Bathing: Dependent Upper Body Dressing: Maximal assistance Lower Body Dressing: Dependent Toileting: Dependent Toilet Transfer:  (stedy +2)     Therapy/Group: Individual Therapy  Jesiel Garate A Miette Molenda 07/29/2020, 3:25 PM

## 2020-07-29 NOTE — Progress Notes (Signed)
Physical Therapy Session Note  Patient Details  Name: Darren Harrison MRN: [id number] Date of Birth: 1955/07/08  Today's Date: 07/29/2020 PT Individual Time: 1050-1200 PT Individual Time Calculation (min): 70 min   Short Term Goals: Week 3:  PT Short Term Goal 1 (Week 3): Pt will perform bed mobility with min A consistently PT Short Term Goal 1 - Progress (Week 3): Progressing toward goal PT Short Term Goal 2 (Week 3): Pt will perform least restrictive transfer with min A consistently PT Short Term Goal 2 - Progress (Week 3): Met PT Short Term Goal 3 (Week 3): Pt will initiate w/c mobility in ultra lightweight wheelchair PT Short Term Goal 3 - Progress (Week 3): Met PT Short Term Goal 4 (Week 3): Pt will maintain sitting balance x 5 min with Supervision PT Short Term Goal 4 - Progress (Week 3): Met Week 4:  PT Short Term Goal 1 (Week 4): =LTG due to ELOS  Skilled Therapeutic Interventions/Progress Updates:  Patient supine in bed on entrance to room. Patient alert and agreeable to PT session. Patient reported earlier pain in back that was treated with manual therapy by OT and currently feeling better so hesitant to attempt walking/ standing.   Therapeutic Activity: Bed Mobility: Patient performed supine <> sit with supervision. No vc/ tc required. Transfers: Patient performed SB transfer bed<>w/c and w/c <> mat table with close supervision. One  instance of catch of board with clothing and need to scoot back to seat in order to reposition board. Pt willing to work on STS in parallel bars with use of Airex pad between therapist's knees and pt's knees for comfort and knee block support. Mod A given via gait belt placed low around pt's pelvis x3. Provided verbal cues for forward lean and effort in pull with BUE. Pt able to stand for up to 1 min each stand and performs lateral weight shifts, scap sets, very small range minisquats for glute and quad sets. Pt self limits time in standing in order to  manage back pain and prevent significant increase in pain.   Wheelchair Mobility:  Patient propelled wheelchair 200 feet with supervision. Is able to manage leg rests with supervision. Good control of chair with ability to maneuver and park next to mat table or bed for safe positioning to perform Sb transfers.  Therapeutic Exercise: Patient performed exercises/ stretches with vc/ tc for proper technique in order to stretch muscles in low back to reduce pain. Seated in front of mat table using small theraball to roll ball forward and into diagonal directions for low into mid back stretch. Pt progresses to hands on mat table in same direction. After transfer to mat table, pt performs previously demonstrated stretches of SKTC, DKTC, piriformis stretch, double knee to side in hooklying. Pt relates decrease in low back pain with stretching.   Patient supine in bed at end of session with brakes locked, bed alarm set, and all needs within reach.     Therapy Documentation Precautions:  Precautions Precautions: Cervical, Fall Precaution Comments: reviewed precautions Required Braces or Orthoses: Cervical Brace Cervical Brace: Hard collar, Other (comment) (donned in sitting) Restrictions Weight Bearing Restrictions: No  Therapy/Group: Individual Therapy  Alger Simons PT, DPT 07/29/2020, 12:54 PM

## 2020-07-29 NOTE — Progress Notes (Signed)
Occupational Therapy Session Note  Patient Details  Name: Darren Harrison MRN: [id number] Date of Birth: 11/14/1955  Today's Date: 07/29/2020 OT Individual Time: 8101-7510 OT Individual Time Calculation (min): 68 min    Short Term Goals: Week 4:  OT Short Term Goal 1 (Week 4): STG=LTG secondary to ELOS  Skilled Therapeutic Interventions/Progress Updates:    Pt resting in bed upon arrival and ready for therapy. Pt threaded pants at bed level and bridged (with assistance stabilizing Bil feet) to pull pants over hips. Supine>sit EOB with supervision. Pt doffed and donned pullover shirt with supervision. Pt donned shoes seated EOB with supervision. SB transfer to w/c with close supervision. Pt propelled w/c to ortho gym and completed SB transfer to NuStep with CGA to stabilize SB. 5 mins level 4 BUE/BLE before resting. 3 additional mins after rest. Focus on BUE/BLE conditioning/strengthening. Pt transferred back to w/c and positioned at [facility] for tranfser. SB tranfser to EOB with superviison. Sit>supine with supervision and pt rolled into sidelying to facilitate soft tissue mobilizations Rt lower back (QL). Pt reported relief and performed stretching activities after sitting EOM. Pt propelled w/c to room and remained in w/c. All needs within reach.  Therapy Documentation Precautions:  Precautions Precautions: Cervical, Fall Precaution Comments: reviewed precautions Required Braces or Orthoses: Cervical Brace Cervical Brace: Hard collar, Other (comment) (donned in sitting) Restrictions Weight Bearing Restrictions: No Pain:  Pt reports his back "feels better" but during activities reported increased "tightness and pain; soft tissue mobilizations provided relief     Therapy/Group: Individual Therapy  [name] 07/29/2020, 9:30 AM

## 2020-07-30 LAB — URINE CULTURE: Culture: >=100000 — AB

## 2020-07-30 NOTE — Progress Notes (Signed)
PROGRESS NOTE   Subjective/Complaints:   No issues overnite , wife at bedside, no caths x 1 wk, denies urinary incont but urine smell in room   ROS:   Pt denies SOB, abd pain, CP, N/V/C/D, and vision changes   Objective:   No results found. No results for input(s): WBC, HGB, HCT, PLT in the last 72 hours.   No results for input(s): NA, K, CL, CO2, GLUCOSE, BUN, CREATININE, CALCIUM in the last 72 hours.    Intake/Output Summary (Last 24 hours) at 07/30/2020 0836 Last data filed at 07/30/2020 0519 Gross per 24 hour  Intake 480 ml  Output 450 ml  Net 30 ml          Physical Exam: Vital Signs Blood pressure 101/81, pulse (!) 56, temperature 98 F (36.7 C), temperature source Oral, resp. rate 15, height 5\' 10"  (1.778 m), weight 74.8 kg, SpO2 98 %.    General: No acute distress Mood and affect are appropriate Heart: Regular rate and rhythm no rubs murmurs or extra sounds Lungs: Clear to auscultation, breathing unlabored, no rales or wheezes Abdomen: Positive bowel sounds, soft nontender to palpation, nondistended Extremities: No clubbing, cyanosis, or edema Skin: No evidence of breakdown, no evidence of rash   Musculoskeletal:  Bilateral knee varus deformity     General: TTP over R shoulder- over biceps tendon as well as R GH joint and AC joint still TTP; crepitus with ROM     Comments: RUE- biceps 3/5, WE 4-/5, triceps 3/5, grip 3+/5, and FA 1/5- developing muscle atrophy in RUE as well as in teres and upper traps on R>L LUE- all muscles 4+/5 RLE- HF 3-/5, KE 3+/5, DF 3-/5, and PF 3-/5, LLE- HF 4+/5, KE 5-/5, DF 4/5 and PF 3-/5;  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 Still having a few spasms/extensor tone-  Sensation: RUE C4 ok- decreased from C5 to S1 on R LUE- C5 OK- decreased from C6 to S1 on L    Assessment/Plan: 1. Functional deficits which require 3+ hours per day of interdisciplinary therapy in a comprehensive inpatient rehab setting. Physiatrist is providing close team supervision and 24 hour management of active medical problems listed below. Physiatrist and rehab team continue to assess barriers to discharge/monitor patient progress toward functional and medical goals  Care Tool:  Bathing  Bathing activity did not occur: Refused Body parts bathed by patient: Right arm, Left arm, Chest, Abdomen, Front perineal area, Right upper leg, Left upper leg, Face, Right lower leg, Left lower leg   Body parts bathed by helper: Buttocks     Bathing assist Assist Level: Minimal Assistance - Patient > 75%     Upper Body Dressing/Undressing Upper body dressing  What is the patient wearing?: Pull over shirt, Orthosis    Upper body assist Assist Level: Minimal Assistance - Patient > 75%    Lower Body Dressing/Undressing Lower body dressing      What is the patient wearing?: Pants     Lower body assist Assist for lower body dressing: Minimal Assistance - Patient > 75%     Toileting Toileting    Toileting assist Assist for toileting: Dependent - Patient 0%     Transfers Chair/bed transfer  Transfers assist     Chair/bed transfer assist level: Contact Guard/Touching assist (slide board)     Locomotion Ambulation   Ambulation assist   Ambulation activity did not occur: Safety/medical concerns  Assist level: Dependent - Patient 0% Assistive device: Lite Gait Max distance: 200'   Walk 10 feet activity   Assist  Walk 10 feet activity did not occur:  Safety/medical concerns  Assist level: Dependent - Patient 0% Assistive device: Lite Gait   Walk 50 feet activity   Assist Walk 50 feet with 2 turns activity did not occur: Safety/medical concerns  Assist level: Dependent - Patient 0% Assistive device: Lite Gait    Walk 150 feet activity   Assist Walk 150 feet activity did not occur: Safety/medical concerns  Assist level: Dependent - Patient 0% Assistive device: Lite Gait    Walk 10 feet on uneven surface  activity   Assist Walk 10 feet on uneven surfaces activity did not occur: Safety/medical concerns         Wheelchair     Assist Will patient use wheelchair at discharge?: Yes Type of Wheelchair: Manual    Wheelchair assist level: Supervision/Verbal cueing Max wheelchair distance: 150'    Wheelchair 50 feet with 2 turns activity    Assist        Assist Level: Supervision/Verbal cueing   Wheelchair 150 feet activity     Assist      Assist Level: Supervision/Verbal cueing   Blood pressure 101/81, pulse (!) 56, temperature 98 F (36.7 C), temperature source Oral, resp. rate 15, height 5\' 10"  (1.778 m), weight 74.8 kg, SpO2 98 %.  Medical Problem List and Plan: 1. 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  Status post C3-4 laminectomy with posterior arthrodesis cervical instrumentation 06/29/2020.  Cervical brace as directed.             -patient may  Shower with neck brace/incision covered             -ELOS/Goals: ~3 weeks- min A- -will order PRAFOs and have pt wear nightly to prevent ankle contractures  5/26- con't PT and OT- spent 20 minutes going over ASIA level, and info about SCI including neurogenic bowel and bladder, spasticity and increased risk of DVT/pressure ulcers.   --con't PT and OT- discussed at length with pt goal is Min A- if so, will need 24/7 A at home- I don't think he'll get to Mod I (explained this)- made agreement if pt does, will  reward with singing! But think this is unlikely- I would think about short term SNF-  6/17- Continue CIR- PT, OT   2.  Antithrombotics: -DVT/anticoagulation: Lovenox.  Vascular study reviewed and negative.  5/26- will need for at least 2 months- maybe 3 months total from surgery. 6/3- will need for a total of 3 months  3. Pain Management: Baclofen 5 mg 3 times daily, oxycodone and Flexeril as needed  5/26- pain pretty well controlled- con't regimen  5/28-   con't regimen prn  5/31- will do R shoulder injection today.  6/3- Shoulder injection 5/31- R shoulder pain almost gone with injection   6/7- Added tramadol- got rid of 10 mg Oxy-   6/10- taking tramadol- works for R >L knee, but shoulder now acting up again- will get xray and go from there- con't regimen  6/11- tramadol- not really taking oxy- con't regimen- add voltaren gel QID.   6/12- voltaren helped some- con't regimen  6/17- will add Lidoderm 1 patch 6pm to 6am-  4. Mood: Provide emotional support  5/26- pt has been tearful understandably- will try Celexa 10 mg daily  5/31- tolerating, will increase to 20 mg daily  6/6- happy with improving results, so brighter- con't regimen  6/15- pt insisting to d/c Celexa- will discontinue.              -antipsychotic agents: N/A 5. Neuropsych: This patient is capable of making decisions on his own behalf. 6. Skin/Wound Care: Routine skin checks 7. Fluids/Electrolytes/Nutrition: Routine in and outs with follow-up chemistries 8.  Atrial fibrillation.  Lopressor 25 mg twice daily.  Cardiac rate controlled  5/28 overall controlled- con't regimen  5/31- will decrease Metoprolol to 12.5 mg daily (was qday) since heart rate on low side and BP low- don't want him orthostatic due to SCI.   6/10- HR 60 this AM- holding metoprolol more than giving. Likely worse due to SCI bradycardia- if doesn't improve, will call Cards next week.   6/17- HR 49 this AM- due to SCI bradycardia.  9.  Hypertension with  orthostatic hypotension.  Cozaar 50 mg daily.  Monitor with increased mobility  5/28 elevated today.  5/30- BP 90s/50s- will monitor for orthostatic hypotension  5/31- will decrease metoprolol as above  6/8- per pt , no dizziness, however BP low- will stop Losartan and con't metoprolol since for rate control.   6/10- BP soft again 90/s50s- off losartan- only on Metoprolol lowest dose- will call Cards next week if doesn't improve.   6/12- HR 67- con't regimen Vitals:   07/29/20 1915 07/30/20 0531  BP: 103/74 101/81  Pulse: 79 (!) 56  Resp: 18 15  Temp: 98.1 F (36.7 C) 98 F (36.7 C)  SpO2: 98% 98%   6/17- bradycardia still an issue- Bpdoing much better 10.  BPH/UTI.  Was on Flomax 0.4 mg daily.   UTI greater than 80,000 Staphylococcus/Enterococcus completing course of Bactrim- please see neurogenic bladder-  11.  Surgical PCR screening positive.  Bactroban as directed 12. Hyperthyroidism- had lost 70 lbs in 6 months- in middle of treatment  6/1- per Endo, want labs checked before appointment 6/7- will order thyroid labs for tomorrow, so they are back for appointment.   6/2- TSH <0.010 and free T4 1.03- free T3 pending.   6/3- pt to have endo phone appt 6/9- asked him to let therapy know to schedule therapy around it.   6/7- has appt today per pt- will let me know if there's any changes!  6/8- allowed to stop Prednisone and will wait on Synthroid for today.   6/14- stopping Protonix since pt doesn't need- was since previous Prednisone dose that's stopped.  13. Fatty liver-will need to be careful with any medicine going through liver. Placed nursing order to request that protein shake recommended by his hepatologist be given at night  6/7- explained to pt that tylenol can affect liver, not the other way around.  14. Neurogenic bladder- will start on  I/o caths q6 hours prn and increase Flomax to 0.8 mg with supper  6/7- will change order to cath if volumes >250cc  6/10- will call Urology  for advise- is usually requiring caths still- but voiding some- don't want pt to get Foley (if goes to SNF).  6/16- no cath required since Monday- will might not need to go home with caths.   15.  Neurogenic bowel- doesn't have good control- will start with suppository- and dig stim and see if can withdraw any of them over time.   6/2- Pt having good results with bowel program- still requiring some dig stim, but less.   6/5: add miralax mixed in prune juice to dinner.   6/6- BM on toilet with dig stim/supp- improving!- con't regimen  Last BM on 6/12 cont bowel program, daily dulc supp 16. Spasticity- will increase Baclofen to 10 mg TID and con't Flexeril.   D/c'd flexeril  5/31- will d/w pt going up on baclofen cannot do zanaflex due to low BP and cannot do dantrolene due to liver issues.  6/14- spasms getting a little worse- wait to increase Baclofen since don't have much more to go up without side effects.   17. Orthostatic hypotension  5/26- added TEDs and Abd binder during day.  5/31- decreased metoprolol   6/8- will stop Losartan 18. Ileus     Resolved tolerates Regular diet 19. R shoulder pain  5/30- will try to do R shoulder injection tomorrow 5/31- R shoulder injections for subacromial and RTC/posterior shoulder injection today  6/2-6/5 pain MUCH better- continue regimen 5/31-received steroid injection.   6/10- will get shoulder xray as above- and if need be, MRI of R shoulder since pain has come back.   6/11- xray shows likely RTC injury and significant DJD of R shoulder- GH and AC- will start Voltaren gel QID.  20. Nausea: resolved: changed valium and reglan to PRN 21. R>L knee pain  6/7- added tramadol 50 mg q6 hours prn and stopped Oxycodone 10 mg- take oxy only if severe pain; try Tramadol, not tlyenol when hurting BEFORE therapy.  6/10- doing well with tramadol c-on't regimen 22. Back pain  6/16- has trigger point - will try therapy- if not, will try trigger point  injection 6/18 some relief with flexeril 23. Dispo  6/8- spent a long time going over that we, as a team, think he would benefit from more time at SNF- for 1-2 months, not long term- OT offered to speak with pt about this.   6/11- d/w pt and wife who were in room- will see how he does this next week.   6/14- team conference today to discuss progress.   6/15- d/c 6/22- for now, but will see how doing next week  6/17- set up insurance to cover until 6/22- filled out handicapped placard.  24.  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

## 2020-07-31 NOTE — Plan of Care (Signed)
  Problem: SCI BOWEL ELIMINATION Goal: RH STG MANAGE BOWEL WITH ASSISTANCE Description: STG Manage Bowel with  min Assistance. Outcome: Not Progressing; bowel program   Problem: SCI BLADDER ELIMINATION Goal: RH STG MANAGE BLADDER WITH ASSISTANCE Description: STG Manage Bladder With  min Assistance Outcome: Not Progressing; bladder scan q 6

## 2020-07-31 NOTE — Progress Notes (Addendum)
Bowel program done; no BM at this time

## 2020-07-31 NOTE — Progress Notes (Signed)
Physical Therapy Session Note  Patient Details  Name: Darren Harrison MRN: [id number] Date of Birth: 1955-12-14  Today's Date: 07/31/2020 PT Individual Time: 1400-1500 PT Individual Time Calculation (min): 60 min   Short Term Goals: Week 4:  PT Short Term Goal 1 (Week 4): =LTG due to ELOS  Skilled Therapeutic Interventions/Progress Updates:    Pt received seated EOB dressed and ready for therapy session. Pt's wife present for hands-on family education session to practice real car transfer. Pt is Supervision for slide board transfer bed to w/c. Manual w/c propulsion x 500 ft with use of BUE at Supervision level. Slide board transfer w/c to/from car with min A, therapist standing behind patient to assist with moving hips along slide board. Pt's wife able to observe transfer and then perform return demonstration. Pt and his wife feel comfortable with his current assist level in/out of car. Pt's wife provided w/c that pt already owns (privately purchased). Adjusted leg rests on w/c for improved fit. Pt able to propel this w/c x 500 ft at Supervision level, exhibits decreased overall speed, increased difficulty with managing turns and uphill propulsion, as well as decreased body mechanics due to increased seat width of chair. Pt requests to return to bed at end of session. Slide board transfer back to bed with CGA due to slide board sliding during transfer. Sit to supine Supervision. Pt left seated in bed with needs in reach, bed alarm in place at end of session.  Therapy Documentation Precautions:  Precautions Precautions: Cervical, Fall Precaution Comments: reviewed precautions Required Braces or Orthoses: Cervical Brace Cervical Brace: Hard collar, Other (comment) (donned in sitting) Restrictions Weight Bearing Restrictions: No   Therapy/Group: Individual Therapy   [name], PT, DPT, CSRS  07/31/2020, 5:14 PM

## 2020-08-01 LAB — COMPREHENSIVE METABOLIC PANEL
ALT: 18 U/L (ref 0–44)
AST: 20 U/L (ref 15–41)
Albumin: 2.7 g/dL — ABNORMAL LOW (ref 3.5–5.0)
Alkaline Phosphatase: 148 U/L — ABNORMAL HIGH (ref 38–126)
Anion gap: 8 (ref 5–15)
BUN: 13 mg/dL (ref 8–23)
CO2: 27 mmol/L (ref 22–32)
Calcium: 8.9 mg/dL (ref 8.9–10.3)
Chloride: 103 mmol/L (ref 98–111)
Creatinine, Ser: 0.67 mg/dL (ref 0.61–1.24)
GFR, Estimated: >60 mL/min (ref >60)
Glucose, Bld: 88 mg/dL (ref 70–99)
Potassium: 4.1 mmol/L (ref 3.5–5.1)
Sodium: 138 mmol/L (ref 135–145)
Total Bilirubin: 0.9 mg/dL (ref 0.3–1.2)
Total Protein: 5.3 g/dL — ABNORMAL LOW (ref 6.5–8.1)

## 2020-08-01 LAB — CBC WITH DIFFERENTIAL/PLATELET
Abs Immature Granulocytes: 0 10*3/uL (ref 0.00–0.07)
Basophils Absolute: 0 10*3/uL (ref 0.0–0.1)
Basophils Relative: 1 %
Eosinophils Absolute: 0.2 10*3/uL (ref 0.0–0.5)
Eosinophils Relative: 5 %
HCT: 33.3 % — ABNORMAL LOW (ref 39.0–52.0)
Hemoglobin: 10.9 g/dL — ABNORMAL LOW (ref 13.0–17.0)
Immature Granulocytes: 0 %
Lymphocytes Relative: 48 %
Lymphs Abs: 1.6 10*3/uL (ref 0.7–4.0)
MCH: 30.3 pg (ref 26.0–34.0)
MCHC: 32.7 g/dL (ref 30.0–36.0)
MCV: 92.5 fL (ref 80.0–100.0)
Monocytes Absolute: 0.2 10*3/uL (ref 0.1–1.0)
Monocytes Relative: 6 %
Neutro Abs: 1.3 10*3/uL — ABNORMAL LOW (ref 1.7–7.7)
Neutrophils Relative %: 40 %
Platelets: 132 10*3/uL — ABNORMAL LOW (ref 150–400)
RBC: 3.6 MIL/uL — ABNORMAL LOW (ref 4.22–5.81)
RDW: 16.3 % — ABNORMAL HIGH (ref 11.5–15.5)
WBC: 3.2 10*3/uL — ABNORMAL LOW (ref 4.0–10.5)
nRBC: 0 % (ref 0.0–0.2)

## 2020-08-01 NOTE — Progress Notes (Signed)
Pt received dulcolax suppository around 1818. Patient had one medium sized bowel movement.

## 2020-08-01 NOTE — Progress Notes (Signed)
Occupational Therapy Session Note  Patient Details  Name: ZAFAR DEBROSSE MRN: [id number] Date of Birth: 02/16/1955  Today's Date: 08/01/2020 OT Individual Time: 1438-8875 OT Individual Time Calculation (min): 70 min    Short Term Goals: Week 4:  OT Short Term Goal 1 (Week 4): STG=LTG secondary to ELOS  Skilled Therapeutic Interventions/Progress Updates:    OT intervention with focus on dressing EOB, SB transfers, DABSC transfers with SB, clothing mgmt, simulated toileting, and safety awareness to increase independence with BADLs. Supine>sit with supervision. UB dressing seated EOB with supervision. All SB tranfsers with supervision. Doffing/donning pants seated on DABSC with CGA. CGA for SB transfers on/off DABSCx2. Supervision for w/c positioning and setup. Pt remained in w/c with all needs within reach.   Therapy Documentation Precautions:  Precautions Precautions: Cervical, Fall Precaution Comments: reviewed precautions Required Braces or Orthoses: Cervical Brace Cervical Brace: Hard collar, Other (comment) (donned in sitting) Restrictions Weight Bearing Restrictions: No   Pain: Pt reports he still feels a "twinge" in his lower back but he is doing "ok."  Therapy/Group: Individual Therapy  [name] 08/01/2020, 9:32 AM

## 2020-08-01 NOTE — Progress Notes (Signed)
Occupational Therapy Session Note  Patient Details  Name: Darren Harrison MRN: [id number] Date of Birth: [birth year]  Today's Date: 08/01/2020 OT Individual Time: 9381-0175 OT Individual Time Calculation (min): 74 min   Short Term Goals:  Week 4:  OT Short Term Goal 1 (Week 4): STG=LTG secondary to ELOS  Skilled Therapeutic Interventions/Progress Updates:    Pt greeted in the w/c with no c/o pain. Already practiced toileting x2 today using the [facility] and PT assisted with relieving back pain via soft tissue massage. He was agreeable to go outside during tx, self propelled w/c to outdoor setting and in outdoor setting for UB strengthening and endurance, min vcs for long efficient pushes. While outdoors, reviewed pressure relief strategies/schedule, taught him gentle back stretches and advised daily routine stretching to prevent pain/muscle strain. Pt very eager to return to work, explained that his work environment is not w/c accessible. OT advised him to consult f/u OT for return to work goals. We also noticed that a blister on his hand popped while outside. Pt reported he didn't notice due to impaired sensation. He was escorted back to the unit and washed his hands. RN notified of popped blister. OT applied pink foam to area. Slideboard<bed completed with close supervision, pt setting up the w/c with min cues and placing/removing board. Pt remained in bed at close of session, all needs within reach and bed alarm set.    Min A for pt to don his c-collar himself today!  Therapy Documentation Precautions:  Precautions Precautions: Cervical, Fall Precaution Comments: reviewed precautions Required Braces or Orthoses: Cervical Brace Cervical Brace: Hard collar, Other (comment) (donned in sitting) Restrictions Weight Bearing Restrictions: No  Vital Signs: Therapy Vitals Temp: 97.9 F (36.6 C) Temp Source: Oral Pulse Rate: 68 Resp: 14 BP: 101/65 Patient Position (if appropriate):  Sitting Oxygen Therapy SpO2: 98 % O2 Device: Room Air Pain Assessment Pain Score: 2  ADL: ADL Eating: Not assessed Grooming: Not assessed Upper Body Bathing: Maximal assistance Lower Body Bathing: Dependent Upper Body Dressing: Maximal assistance Lower Body Dressing: Dependent Toileting: Dependent Toilet Transfer:  (stedy +2)     Therapy/Group: Individual Therapy  Emre Stock A Jovonda Selner 08/01/2020, 4:02 PM

## 2020-08-01 NOTE — Progress Notes (Signed)
PROGRESS NOTE   Subjective/Complaints:  Pt reports did car transfers into his own car.  Ate 100% breakfast.  Getting dressed on own, but according to pt- transfers are mod I- per OT, no- it's SBA to CGA; spasms were worse yesterday- however not painful- just startling.  Triggered by touch around the waist.  Also, back pain ~ 50% better.    ROS:   Pt denies SOB, abd pain, CP, N/V/C/D, and vision changes    Objective:   No results found. Recent Labs    08/01/20 0543  WBC 3.2*  HGB 10.9*  HCT 33.3*  PLT 132*     Recent Labs    08/01/20 0543  NA 138  K 4.1  CL 103  CO2 27  GLUCOSE 88  BUN 13  CREATININE 0.67  CALCIUM 8.9      Intake/Output Summary (Last 24 hours) at 08/01/2020 1353 Last data filed at 08/01/2020 0838 Gross per 24 hour  Intake 1080 ml  Output 1025 ml  Net 55 ml         Physical Exam: Vital Signs Blood pressure 101/65, pulse 68, temperature 97.9 F (36.6 C), temperature source Oral, resp. rate 14, height 5\' 10"  (1.778 m), weight 74.8 kg, SpO2 98 %.     General: awake, alert, appropriate, sitting up in bed- denies incontinence, but room- light odor of urine?; NAD HENT: conjugate gaze; oropharynx moist CV: regular rate;- rate better today;  no JVD Pulmonary: CTA B/L; no W/R/R- good air movement GI: soft, NT, ND, (+)BS Psychiatric: appropriate; interactive Neurological: Ox3 Musculoskeletal:  Bilateral knee varus deformity ; also TTP over r lumbar parspinal/trigger point.     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 Still having a few spasms/extensor tone-  Sensation: RUE C4 ok- decreased from C5 to S1 on R LUE- C5 OK- decreased from C6 to S1 on L    Assessment/Plan: 1. Functional deficits which require 3+ hours per day of interdisciplinary therapy in a comprehensive inpatient rehab setting. Physiatrist is providing close team supervision and 24 hour management of active medical problems listed below. Physiatrist and rehab team continue to assess barriers to discharge/monitor patient progress toward functional and medical goals  Care Tool:  Bathing  Bathing activity did not occur:  Refused Body parts bathed by patient: Right arm, Left arm, Chest, Abdomen, Front perineal area, Right upper leg, Left upper leg, Face, Right lower leg, Left lower leg   Body parts bathed by helper: Buttocks     Bathing assist Assist Level: Minimal Assistance - Patient > 75%     Upper Body Dressing/Undressing Upper body dressing   What is the patient wearing?: Pull over shirt, Orthosis    Upper body assist Assist Level: Minimal Assistance - Patient > 75%    Lower Body Dressing/Undressing Lower body dressing      What is the patient wearing?: Pants     Lower body assist Assist for lower body dressing: Minimal Assistance - Patient > 75%     Toileting Toileting    Toileting assist Assist for toileting: Minimal Assistance - Patient > 75%     Transfers Chair/bed transfer  Transfers assist     Chair/bed transfer assist level: Maximal Assistance - Patient 25 - 49% (squat pivot)      Locomotion Ambulation   Ambulation assist   Ambulation activity did not occur: Safety/medical concerns  Assist level: Dependent - Patient 0% Assistive device: Lite Gait Max distance: 200'   Walk 10 feet activity   Assist  Walk 10 feet activity did not occur: Safety/medical concerns  Assist level: Dependent - Patient 0% Assistive device: Lite Gait   Walk 50 feet activity   Assist Walk 50 feet with 2 turns activity did not occur: Safety/medical concerns  Assist level: Dependent - Patient 0% Assistive device: Lite Gait    Walk 150 feet activity   Assist Walk 150 feet activity did not occur: Safety/medical concerns  Assist level: Dependent - Patient 0% Assistive device: Lite Gait    Walk 10 feet on uneven surface  activity   Assist Walk 10 feet on uneven surfaces activity did not occur: Safety/medical concerns         Wheelchair     Assist Will patient use wheelchair at discharge?: Yes Type of Wheelchair: Manual    Wheelchair assist level: Supervision/Verbal cueing Max wheelchair distance: 150'    Wheelchair 50 feet with 2 turns activity    Assist        Assist Level: Supervision/Verbal cueing   Wheelchair 150 feet activity     Assist      Assist Level: Supervision/Verbal cueing   Blood pressure 101/65, pulse 68, temperature 97.9 F (36.6 C), temperature source Oral, resp. rate 14, height 5\' 10"  (1.778 m), weight 74.8 kg, SpO2 98 %.  Medical Problem List and Plan: 1. 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  Status post C3-4 laminectomy with posterior arthrodesis cervical instrumentation 06/29/2020.  Cervical brace as directed.             -patient may  Shower with neck brace/incision covered             -ELOS/Goals: ~3 weeks- min A- -will order PRAFOs and have pt wear nightly to prevent ankle contractures  5/26- con't PT and OT- spent 20 minutes going over ASIA level, and info about SCI  including neurogenic bowel and bladder, spasticity and increased risk of DVT/pressure ulcers.   --con't PT and OT- discussed at length with pt goal is Min A- if so, will need 24/7 A at home- I don't think he'll get to Mod I (explained this)- made agreement if pt does, will reward with singing! But think this is unlikely- I would think about short term SNF-  6/20- Continue CIR- PT, OT - therapy wants to keep him 1 more week- will see if can get insurance to agree- they think this will get him home.  2.  Antithrombotics: -DVT/anticoagulation: Lovenox.  Vascular study reviewed and negative.  5/26- will need for at least 2 months- maybe 3 months total from surgery. 6/3- will need for a total of 3 months   6/20- went over again with pt.  3. Pain Management: Baclofen 5 mg 3 times daily, oxycodone and Flexeril as needed  5/26- pain pretty well controlled- con't regimen  5/28-   con't regimen prn  5/31- will do R shoulder injection today.  6/3- Shoulder injection 5/31- R shoulder pain almost gone with injection   6/7- Added tramadol- got rid of 10 mg Oxy-   6/10- taking tramadol- works for R >L knee, but shoulder now acting up again- will get xray and go from there- con't regimen  6/11- tramadol- not really taking oxy- con't regimen- add voltaren gel QID.   6/12- voltaren helped some- con't regimen  6/17- will add Lidoderm 1 patch 6pm to 6am-  4. Mood: Provide emotional support  5/26- pt has been tearful understandably- will try Celexa 10 mg daily  5/31- tolerating, will increase to 20 mg daily  6/6- happy with improving results, so brighter- con't regimen  6/15- pt insisting to d/c Celexa- will discontinue.              -antipsychotic agents: N/A 5. Neuropsych: This patient is capable of making decisions on his own behalf. 6. Skin/Wound Care: Routine skin checks 7. Fluids/Electrolytes/Nutrition: Routine in and outs with follow-up chemistries 8.  Atrial fibrillation.  Lopressor 25 mg twice daily.   Cardiac rate controlled  5/28 overall controlled- con't regimen  5/31- will decrease Metoprolol to 12.5 mg daily (was qday) since heart rate on low side and BP low- don't want him orthostatic due to SCI.   6/10- HR 60 this AM- holding metoprolol more than giving. Likely worse due to SCI bradycardia- if doesn't improve, will call Cards next week.   6/17- HR 49 this AM- due to SCI bradycardia. 6/20- Bradycardia better today- con't off B blocker.   9.  Hypertension with orthostatic hypotension.  Cozaar 50 mg daily.  Monitor with increased mobility  5/28 elevated today.  5/30- BP 90s/50s- will monitor for orthostatic hypotension  5/31- will decrease metoprolol as above  6/8- per pt , no dizziness, however BP low- will stop Losartan and con't metoprolol since for rate control.   6/10- BP soft again 90/s50s- off losartan- only on Metoprolol lowest dose- will call Cards next week if doesn't improve.   6/12- HR 67- con't regimen Vitals:   08/01/20 0437 08/01/20 1351  BP: 91/63 101/65  Pulse: 60 68  Resp: 18 14  Temp: 97.6 F (36.4 C) 97.9 F (36.6 C)  SpO2: 97% 98%   6/17- bradycardia still an issue- Bpdoing much better 10.  BPH/UTI.  Was on Flomax 0.4 mg daily.   UTI greater than 80,000 Staphylococcus/Enterococcus completing course of Bactrim- please see neurogenic bladder-  11.  Surgical PCR screening positive.  Bactroban as directed 12. Hyperthyroidism- had lost 70 lbs in 6 months- in middle of treatment  6/1- per Endo, want labs checked before appointment 6/7- will order thyroid labs for tomorrow, so they are back for appointment.   6/2- TSH <0.010 and free T4 1.03- free T3 pending.   6/3- pt to have endo phone appt 6/9- asked him to  let therapy know to schedule therapy around it.   6/7- has appt today per pt- will let me know if there's any changes!  6/8- allowed to stop Prednisone and will wait on Synthroid for today.   6/14- stopping Protonix since pt doesn't need- was since previous  Prednisone dose that's stopped.  13. Fatty liver-will need to be careful with any medicine going through liver. Placed nursing order to request that protein shake recommended by his hepatologist be given at night  6/7- explained to pt that tylenol can affect liver, not the other way around.  14. Neurogenic bladder- will start on I/o caths q6 hours prn and increase Flomax to 0.8 mg with supper  6/7- will change order to cath if volumes >250cc  6/10- will call Urology for advise- is usually requiring caths still- but voiding some- don't want pt to get Foley (if goes to SNF).  6/16- no cath required since Monday- will might not need to go home with caths.   6/20- no caths for 7 days- but room odor of urine?will ask nursing/therapy.  15.  Neurogenic bowel- doesn't have good control- will start with suppository- and dig stim and see if can withdraw any of them over time.   6/2- Pt having good results with bowel program- still requiring some dig stim, but less.   6/5: add miralax mixed in prune juice to dinner.   6/6- BM on toilet with dig stim/supp- improving!- con't regimen  Last BM on 6/12 cont bowel program, daily dulc supp 16. Spasticity- will increase Baclofen to 10 mg TID and con't Flexeril.   D/c'd flexeril  5/31- will d/w pt going up on baclofen cannot do zanaflex due to low BP and cannot do dantrolene due to liver issues.  6/14- spasms getting a little worse- wait to increase Baclofen since don't have much more to go up without side effects.   17. Orthostatic hypotension  5/26- added TEDs and Abd binder during day.  5/31- decreased metoprolol   6/8- will stop Losartan 18. Ileus     Resolved tolerates Regular diet 19. R shoulder pain  5/30- will try to do R shoulder injection tomorrow 5/31- R shoulder injections for subacromial and RTC/posterior shoulder injection today  6/2-6/5 pain MUCH better- continue regimen 5/31-received steroid injection.   6/10- will get shoulder xray as  above- and if need be, MRI of R shoulder since pain has come back.   6/11- xray shows likely RTC injury and significant DJD of R shoulder- GH and AC- will start Voltaren gel QID.  20. Nausea: resolved: changed valium and reglan to PRN 21. R>L knee pain  6/7- added tramadol 50 mg q6 hours prn and stopped Oxycodone 10 mg- take oxy only if severe pain; try Tramadol, not tlyenol when hurting BEFORE therapy.  6/10- doing well with tramadol c-on't regimen 22. Back pain  6/16- has trigger point - will try therapy- if not, will try trigger point injection  6/20- will see if needs to injections- 50% better- will see tomorrow 6/18 some relief with flexeril 23. Dispo  6/8- spent a long time going over that we, as a team, think he would benefit from more time at SNF- for 1-2 months, not long term- OT offered to speak with pt about this.   6/11- d/w pt and wife who were in room- will see how he does this next week.   6/14- team conference today to discuss progress.   6/15- d/c 6/22- for now, but will  see how doing next week  6/17- set up insurance to cover until 6/22- filled out handicapped placard.  24.  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

## 2020-08-01 NOTE — Progress Notes (Signed)
Physical Therapy Session Note  Patient Details  Name: Darren Harrison MRN: [id number] Date of Birth: 1955/07/10  Today's Date: 08/01/2020 PT Individual Time: 1000-1100 PT Individual Time Calculation (min): 60 min   Short Term Goals: Week 4:  PT Short Term Goal 1 (Week 4): =LTG due to ELOS   Skilled Therapeutic Interventions/Progress Updates:    Pt received seated in w/c in room, agreeable to PT session. Pt reports ongoing R lower back pain with palpable trigger points in QL and upper glute region. Manual w/c propulsion up to 150 ft at Supervision level with use of BUE. Introduced squat pivot transfers this session. Pt is initially max A for transfer but progresses to mod A overall. Pt exhibits improved understanding and demonstration of head/hips relationship as session progresses. Sit to/from supine with Supervision on flat mat table. In sidelying performed trigger point release and STM to R QL and upper glute region, multiple trigger points palpated, decrease with manual therapy. Pt reports improvement in pain symptoms following manual therapy. Trial manual w/c propulsion in lighweight manual chair and ultralightweight manual chair, see timed results below:  Lightweight manual w/c x 30 ft,16.9 sec, 15 strokes Ultralightweight manual w/c x 30 ft, 12.9 sec, 12 strokes  Pt exhibits decreased time and decreased amount of propulsion needed to cover the same distance in ultralightweight manual chair. Pt left seated in w/c in room with needs in reach at end of session.  Therapy Documentation Precautions:  Precautions Precautions: Cervical, Fall Precaution Comments: reviewed precautions Required Braces or Orthoses: Cervical Brace Cervical Brace: Hard collar, Other (comment) (donned in sitting) Restrictions Weight Bearing Restrictions: No     Therapy/Group: Individual Therapy   [name], PT, DPT, CSRS  08/01/2020, 12:09 PM
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
## 2020-08-02 ENCOUNTER — Other Ambulatory Visit (HOSPITAL_COMMUNITY): Payer: Self-pay

## 2020-08-02 LAB — URINALYSIS, ROUTINE W REFLEX MICROSCOPIC
Bilirubin Urine: NEGATIVE
Glucose, UA: NEGATIVE mg/dL
Hgb urine dipstick: NEGATIVE
Ketones, ur: NEGATIVE mg/dL
Nitrite: POSITIVE — AB
Protein, ur: NEGATIVE mg/dL
Specific Gravity, Urine: 1.005 (ref 1.005–1.030)
pH: 7 (ref 5.0–8.0)

## 2020-08-02 MED ORDER — SENNA 8.6 MG PO TABS: 1.0000 | ORAL_TABLET | Freq: Every day | ORAL | 0 refills | Status: DC

## 2020-08-02 MED ORDER — ZINC SULFATE 220 (50 ZN) MG PO TABS
220.0000 mg | ORAL_TABLET | Freq: Every day | ORAL | 0 refills | Status: AC
  Filled 2020-08-02 – 2020-08-08 (×2): qty 30, 30d supply, fill #0

## 2020-08-02 MED ORDER — DICLOFENAC SODIUM 1 % EX GEL
2.0000 g | Freq: Four times a day (QID) | CUTANEOUS | 0 refills | Status: DC
  Filled 2020-08-02: qty 100, 25d supply, fill #0
  Filled 2020-08-08: qty 200, 25d supply, fill #0

## 2020-08-02 MED ORDER — CYCLOBENZAPRINE HCL 10 MG PO TABS
10.0000 mg | ORAL_TABLET | Freq: Every day | ORAL | 0 refills | Status: DC
  Filled 2020-08-02 – 2020-08-08 (×2): qty 30, 30d supply, fill #0

## 2020-08-02 MED ORDER — LIDOCAINE 5 % EX PTCH
1.0000 | MEDICATED_PATCH | CUTANEOUS | 0 refills | Status: DC
  Filled 2020-08-02 – 2020-08-08 (×2): qty 30, 30d supply, fill #0

## 2020-08-02 MED ORDER — TRAMADOL HCL 50 MG PO TABS
50.0000 mg | ORAL_TABLET | Freq: Four times a day (QID) | ORAL | 0 refills | Status: DC | PRN
  Filled 2020-08-02 – 2020-08-08 (×2): qty 30, 7d supply, fill #0

## 2020-08-02 MED ORDER — ACETAMINOPHEN 325 MG PO TABS: 650.0000 mg | ORAL_TABLET | ORAL | Status: AC | PRN

## 2020-08-02 MED ORDER — SULFAMETHOXAZOLE-TRIMETHOPRIM 800-160 MG PO TABS
1.0000 | ORAL_TABLET | Freq: Two times a day (BID) | ORAL | Status: AC
  Administered 2020-08-02 – 2020-08-09 (×14): 1 via ORAL
  Filled 2020-08-02 (×14): qty 1

## 2020-08-02 MED ORDER — POLYETHYLENE GLYCOL 3350 17 G PO PACK: 17.0000 g | PACK | Freq: Every day | ORAL | 0 refills | Status: DC

## 2020-08-02 MED ORDER — BISACODYL 10 MG RE SUPP: 10.0000 mg | Freq: Every day | RECTAL | 0 refills | Status: DC

## 2020-08-02 MED ORDER — BACLOFEN 5 MG PO TABS
15.0000 mg | ORAL_TABLET | Freq: Three times a day (TID) | ORAL | 0 refills | Status: DC
  Filled 2020-08-02: qty 90, 30d supply, fill #0
  Filled 2020-08-08: qty 270, 30d supply, fill #0

## 2020-08-02 MED ORDER — DOCUSATE SODIUM 100 MG PO CAPS: 100.0000 mg | ORAL_CAPSULE | Freq: Every day | ORAL | 0 refills | Status: DC | PRN

## 2020-08-02 MED ORDER — TAMSULOSIN HCL 0.4 MG PO CAPS
0.8000 mg | ORAL_CAPSULE | Freq: Every day | ORAL | 0 refills | Status: DC
  Filled 2020-08-02 – 2020-08-08 (×2): qty 60, 30d supply, fill #0

## 2020-08-02 MED ORDER — ENOXAPARIN SODIUM 40 MG/0.4ML IJ SOSY
PREFILLED_SYRINGE | INTRAMUSCULAR | 1 refills | Status: DC
  Filled 2020-08-02: qty 12, 30d supply, fill #0

## 2020-08-02 NOTE — Progress Notes (Signed)
Occupational Therapy Session Note  Patient Details  Name: MEKHI SONN MRN: [id number] Date of Birth: 1955-07-30  Today's Date: 08/02/2020 OT Individual Time: 1219-7588 OT Individual Time Calculation (min): 56 min    Short Term Goals: Week 3:  OT Short Term Goal 1 (Week 3): Pt will perform UB dressing with min A seated in w/c OT Short Term Goal 2 (Week 3): Pt will complete LB dressing with mod A at bed level OT Short Term Goal 2 - Progress (Week 3): Met OT Short Term Goal 3 (Week 3): Pt will perform SB transfers with min A OT Short Term Goal 3 - Progress (Week 3): Met OT Short Term Goal 4 (Week 3): Pt will complete sit<>stand in [place] with min A OT Short Term Goal 4 - Progress (Week 3): Progressing toward goal Week 4:  OT Short Term Goal 1 (Week 4): STG=LTG secondary to ELOS  Skilled Therapeutic Interventions/Progress Updates:    Patient seated in w/c, alert and ready for therapy session.  moist heat pack on lower back removed - no redness or skin irritation.  Reviewed options for placement of DME in home environment and strategies for care/cleaning and ways to move w/c etc out of way and back in place for safe transfers.  Discussed time of day for bowel program and options for clothing, energy conservation.  Patient performed SB transfer w/c to bed with CGA, mod A for w/c management, SB transfer bed to/from wide surface drop arm commode CG/min A.  Utilized dycem to assist with board stability on commode surface.  Patient demonstrated ability to pull pants down with lateral leans, discussed hygiene options and back up plan for assist by helper (ease of reach with bucket propped below commode) he required mod A for pants up due to bulky nature of brief and mild fatigue.  He returned to supine position in bed at close of session.  Sit to supine with CS using bed rails.  Bed alarm set and call bell/tray table in reach.    Therapy Documentation Precautions:  Precautions Precautions: Cervical,  Fall Precaution Comments: reviewed precautions Required Braces or Orthoses: Cervical Brace Cervical Brace: Hard collar, Other (comment) (donned in sitting) Restrictions Weight Bearing Restrictions: No   Therapy/Group: Individual Therapy  Carlos Levering 08/02/2020, 7:54 AM

## 2020-08-02 NOTE — Progress Notes (Signed)
Physical Therapy Session Note  Patient Details  Name: Darren Harrison MRN: [id number] Date of Birth: [birth year]  Today's Date: 08/02/2020 PT Individual Time: 1300-1415 PT Individual Time Calculation (min): 75 min   Short Term Goals: Week 4:  PT Short Term Goal 1 (Week 4): =LTG due to ELOS  Skilled Therapeutic Interventions/Progress Updates:    Pt received seated in bed, agreeable to PT session. Pt reports low back pain this is improving, received pain medication prior to start of therapy session. Deberah Pelton, ATP present during therapy session for seating evaluation for custom ulralightweight manual wheelchair. Pt able to engage in discussion with regards to preferred chair accessories, type of seating system, etc. Supine to sit with Supervision with use of bedrail. Assisted pt with donning shoes while seated EOB for time and energy conservation. Slide board transfer to w/c with Supervision. Manual w/c propulsion to/from therapy gym at Supervision level, some assist needed for management of w/c parts. Squat pivot transfer w/c to/from mat table with mod A, focus on head/hips relationship, anterior lean, and clearing buttocks during transfer. Seated anterior leans 2 x 5 reps with min A and use of mirror for visual feedback. Pt exhibits improved clearing of buttocks during transfer following blocked practice, would benefit from ongoing practice. Pt left seated in w/c in room with needs in reach, moist hot pack to low back at end of session.  Therapy Documentation Precautions:  Precautions Precautions: Cervical, Fall Precaution Comments: reviewed precautions Required Braces or Orthoses: Cervical Brace Cervical Brace: Hard collar, Other (comment) (donned in sitting) Restrictions Weight Bearing Restrictions: No    Therapy/Group: Individual Therapy   [name], PT, DPT, CSRS  08/02/2020, 4:30 PM

## 2020-08-02 NOTE — Progress Notes (Signed)
Lovenox teaching done with patient 6/20 and 6/21. Patient verbalized understanding and demonstrated proper technique.

## 2020-08-02 NOTE — Progress Notes (Signed)
Occupational Therapy Session Note  Patient Details  Name: Darren Harrison MRN: [id number] Date of Birth: September 21, 1955  Today's Date: 08/02/2020 OT Individual Time: 6294-7654 OT Individual Time Calculation (min): 54 min    Short Term Goals: Week 4:  OT Short Term Goal 1 (Week 4): STG=LTG secondary to ELOS  Skilled Therapeutic Interventions/Progress Updates:    Pt resting in bed upon arrival. Pt c/o some lower back pain as noted below.Therapist assisted with pulling pant over hips at bed level. Supine>sit EOB with supervision. Pt donned shirt and shoe seated EOB with supervision. Focus on DABSC tranfsers and clothing mgmt. SB transfer bed<>DABSC with CGA. Pt's brief snagged on SB during return to bed and required physical assistance. Pt's back became more painful. Pt returned to sidelying in bed. Soft tissue manipulation for pain relief Pt sat EOB and transferred to w/c with supervision. Pt remained in w/c with all needs within reach. Pt would benefit from continued [place] transfers and clothing management practice to enable pt to perform at mod I level when at home without assistance.   Therapy Documentation Precautions:  Precautions Precautions: Cervical, Fall Precaution Comments: reviewed precautions Required Braces or Orthoses: Cervical Brace Cervical Brace: Hard collar, Other (comment) (donned in sitting) Restrictions Weight Bearing Restrictions: No  Pain: Pt c/o Rt lower back pain (unrated) which became more painful with activity; pain meds admin immediately prior to therapy; soft tissue massage with some relief noted   Therapy/Group: Individual Therapy  [name] 08/02/2020, 8:58 AM

## 2020-08-02 NOTE — Progress Notes (Signed)
Patient noted with small bowel movement and dig stim. Dig stim performed after bowel movement with no stool palpated in rectal vault. Bowel program completed.

## 2020-08-02 NOTE — Progress Notes (Signed)
Patient ID: JERICK KHACHATRYAN, male   DOB: 1955-11-11, 65 y.o.   MRN: [id number]  SW met with pt in room with PT during w/c evaluation. SW discussed with pt change in d/c date to 6/29 due to gains made in rehab with ultimate plan for pt to d/c to home. SW did share with pt unsure if additional days will be approved by insurance, so will continue to plan as if he is leaving at anytime, since updates on patient progress can not be sent to insurance until it is his scheduled d/c date. Preferred outpatient location is [facility] in [place], [place] [phone]). Pt aware he will be updated once a decision has been made.  [place] left message for pt wife  Silva Bandy [phone]) to inform on change in d/c date to 6/29 and informed on above shared. SW requested return phone call. *SW spoke with pt wife to discuss above. Wife reports that she will pick up pt any day that she needs too. Confirms she purchased bariatric DABSC for pt. She is aware referral for therapies will be sent to [facility].  SW called [facility] and spoke with staff in referrals reporting that I would need to speak with staff in therapies. SW left message and waiting on follow-up.   Loralee Pacas, MSW, [place] Office: [phone] Cell: [phone] Fax: [phone]

## 2020-08-02 NOTE — Patient Care Conference (Signed)
Inpatient RehabilitationTeam Conference and Plan of Care Update Date: 08/02/2020   Time: 11:01 AM    Patient Name: Darren Harrison      Medical Record Number: [id number]  Date of Birth: 06-09-1955 Sex: Male         Room/Bed: 4M10C/4M10C-01 Payor Info: Payor: [employer] / Plan: CIG[employer] MANAGED / Product Type: *No Product type* /    Admit Date/Time:  07/05/2020  3:07 PM  Primary Diagnosis:  Cervical myelopathy [facility])  Hospital Problems: Principal Problem:   Cervical myelopathy ([place]) Active Problems:   Pressure injury of skin   Neurogenic bowel   Neurogenic bladder   Spasticity    Expected Discharge Date: Expected Discharge Date: 08/10/20  Team Members Present: Physician leading conference: Dr. Courtney Heys Care Coodinator Present: Loralee Pacas, LCSWA;Zamantha Strebel Creig Hines, RN, BSN, CRRN Nurse Present: Dorthula Nettles, RN PT Present: [name], PT OT Present: Roanna Epley, COTA;Jennifer Tamala Julian, OT PPS Coordinator present : Gunnar Fusi, SLP     Current Status/Progress Goal Weekly Team Focus  Bowel/Bladder   Daily bowel program with daily results after dig stim performed. Bladder scan and PVRs continues with volumes <250 after double voids. Continent of bowel and bladder.  Will regain regular urinary and bowel patterns.  Continue with bowel program and in and out caths as ordered.   Swallow/Nutrition/ Hydration             ADL's   LB dressing seated EOB-min A: UB dressing seated EOB-supervision; bathing at shower level-min A; SB tranfsers to DABSC-CGA/supervision; toileting (use of urinal)-mod I  min A overall  DABSC transfers, educaiton, toileting tasks seated DABSC   Mobility   Supervision bed mobility with use of bedrail, Supervision to CGA SB transfers, Supervision to mod I w/c mobility, +2/dependent standing and gait in // bars or with LiteGait  Supervision overall, mod I w/c mobility  LE NMR, transfers, w/c mobility, pressure relief, family edu, custom w/c eval    Communication             Safety/Cognition/ Behavioral Observations            Pain   Denies pain  remain pain free  Assess pain everys shift and prn   Skin   Surgical incision healed, no other skin issues  skin will remain intact  Assess skin every shift and prn     Discharge Planning:  D/c to home with intermittent support from wife who will visit during the her lunch break; works 15 minutes away. Pt will need to be Mod I or intermittent support at discharge.   Team Discussion: Did trigger point injections for right low back pain. UA/C&S ordered, HR still low, would like to extend one more week. Bowel program going well, I&O cathing intermittently, 7/10 pain at all times. Educate bowel program and Lovenox injections. Patient on target to meet rehab goals: yes, supervision bed mobility, and slide board. Independent in W/C, teaching pressure relief. Transfers to drop arm BSC, dressing at EOB and working on getting pants back up. Would benefit from an extra week.   *See Care Plan and progress notes for long and short-term goals.   Revisions to Treatment Plan:  Not at this time.  Teaching Needs: Family education, medication management, pain management, bowel/bladder education, Lovenox education, pressure relief education, transfer training, balance training, endurance training, safety awareness.  Current Barriers to Discharge: Decreased caregiver support, Medical stability, Home enviroment access/layout, Incontinence, Neurogenic bowel and bladder, Wound care, Lack of/limited family support, Weight bearing restrictions, Medication  compliance, and Behavior  Possible Resolutions to Barriers: Continue current medications, provide emotional support.     Medical Summary Current Status: incontinent of urine-emptying/ voiding; on bowel program back pain 7/10- needs myofascial release; needs lovenox at home; skin issues/need to asess again  Barriers to Discharge: Decreased family/caregiver  support;Home enviroment access/layout;Incontinence;Neurogenic Bowel & Bladder;Medical stability;Wound care;Weight bearing restrictions  Barriers to Discharge Comments: either going home with wife- will try to keep 1 more week- to get home but not SNF; in cervical collar; wants outpt PT/OT Possible Resolutions to Barriers/Weekly Focus: SB transfers Supervision; focus- needs to use [employer] for w/c eval today; , at 1pm today; checking if has another UTI?- waiting to see; did trigger point injection for R paraspinal myofascial pain. overall Min A to supervision- d/c- 6/29 if possible- think can get him home vs SNF.   Continued Need for Acute Rehabilitation Level of Care: The patient requires daily medical management by a physician with specialized training in physical medicine and rehabilitation for the following reasons: Direction of a multidisciplinary physical rehabilitation program to maximize functional independence : Yes Medical management of patient stability for increased activity during participation in an intensive rehabilitation regime.: Yes Analysis of laboratory values and/or radiology reports with any subsequent need for medication adjustment and/or medical intervention. : Yes   I attest that I was present, lead the team conference, and concur with the assessment and plan of the team.   Cristi Loron 08/02/2020, 5:33 PM

## 2020-08-02 NOTE — Progress Notes (Signed)
PROGRESS NOTE   Subjective/Complaints:  Pt reports back still hurting- is better, but thinks is willing to do trigger point injections.  Also, room still smells of urine and since having back pain- have to wonder if having CVA tenderness?    ROS:   Pt denies SOB, abd pain, CP, N/V/C/D, and vision changes  Objective:   No results found. Recent Labs    08/01/20 0543  WBC 3.2*  HGB 10.9*  HCT 33.3*  PLT 132*     Recent Labs    08/01/20 0543  NA 138  K 4.1  CL 103  CO2 27  GLUCOSE 88  BUN 13  CREATININE 0.67  CALCIUM 8.9      Intake/Output Summary (Last 24 hours) at 08/02/2020 1000 Last data filed at 08/02/2020 0845 Gross per 24 hour  Intake 1620 ml  Output 1675 ml  Net -55 ml         Physical Exam: Vital Signs Blood pressure 108/79, pulse (!) 49, temperature 97.8 F (36.6 C), temperature source Oral, resp. rate 17, height 5\' 10"  (1.778 m), weight 74.8 kg, SpO2 97 %.     General: awake, alert, appropriate, sitting up in bed; room smells of urine still; NAD HENT: conjugate gaze; oropharynx moist CV: bradycardic rate; no JVD Pulmonary: CTA B/L; no W/R/R- good air movement GI: soft, NT, ND, (+)BS Psychiatric: appropriate Neurological: Ox3; MAS of 1+  Musculoskeletal:  Bilateral knee varus deformity ; also TTP over r lumbar parspinal/trigger point.  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 Still having a few spasms/extensor tone-  Sensation: RUE C4 ok- decreased from C5 to S1 on R LUE- C5 OK- decreased from C6 to S1 on L    Assessment/Plan: 1. Functional deficits which require 3+ hours per day of interdisciplinary therapy in a comprehensive inpatient rehab setting. Physiatrist is providing close team supervision and 24 hour management of active medical problems listed below. Physiatrist and rehab team continue to assess barriers to discharge/monitor patient progress toward functional and medical goals  Care Tool:  Bathing  Bathing activity did not occur: Refused Body parts bathed by patient: Right arm, Left arm, Chest, Abdomen, Front perineal area, Right upper leg, Left upper leg, Face, Right  lower leg, Left lower leg   Body parts bathed by helper: Buttocks     Bathing assist Assist Level: Minimal Assistance - Patient > 75%     Upper Body Dressing/Undressing Upper body dressing   What is the patient wearing?: Pull over shirt, Orthosis    Upper body assist Assist Level: Minimal Assistance - Patient > 75%    Lower Body Dressing/Undressing Lower body dressing      What is the patient wearing?: Pants     Lower body assist Assist for lower body dressing: Minimal Assistance - Patient > 75%     Toileting Toileting    Toileting assist Assist for toileting: Minimal Assistance - Patient > 75%     Transfers Chair/bed transfer  Transfers assist     Chair/bed transfer assist level: Maximal Assistance - Patient 25 - 49% (squat pivot)     Locomotion Ambulation   Ambulation assist   Ambulation activity did not occur: Safety/medical concerns  Assist level:  Dependent - Patient 0% Assistive device: Lite Gait Max distance: 200'   Walk 10 feet activity   Assist  Walk 10 feet activity did not occur: Safety/medical concerns  Assist level: Dependent - Patient 0% Assistive device: Lite Gait   Walk 50 feet activity   Assist Walk 50 feet with 2 turns activity did not occur: Safety/medical concerns  Assist level: Dependent - Patient 0% Assistive device: Lite Gait    Walk 150 feet activity   Assist Walk 150 feet activity did not occur: Safety/medical concerns  Assist level: Dependent - Patient 0% Assistive device: Lite Gait    Walk 10 feet on uneven surface  activity   Assist Walk 10 feet on uneven surfaces activity did not occur: Safety/medical concerns         Wheelchair     Assist Will patient use wheelchair at discharge?: Yes Type of Wheelchair: Manual    Wheelchair assist level: Supervision/Verbal cueing Max wheelchair distance: 150'    Wheelchair 50 feet with 2 turns activity    Assist        Assist Level: Supervision/Verbal cueing   Wheelchair 150 feet activity     Assist      Assist Level: Supervision/Verbal cueing   Blood pressure 108/79, pulse (!) 49, temperature 97.8 F (36.6 C), temperature source Oral, resp. rate 17, height 5\' 10"  (1.778 m), weight 74.8 kg, SpO2 97 %.  Medical Problem List and Plan: 1. 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  Status post C3-4 laminectomy with posterior arthrodesis cervical instrumentation 06/29/2020.  Cervical brace as directed.             -patient may  Shower with neck brace/incision covered             -ELOS/Goals: ~3 weeks- min A- -will order PRAFOs and have pt wear nightly to prevent ankle contractures  5/26- con't PT and OT- spent 20 minutes going over ASIA level, and info about SCI including neurogenic bowel and bladder, spasticity and increased risk of DVT/pressure ulcers.   --con't PT and OT- discussed  at length with pt goal is Min A- if so, will need 24/7 A at home- I don't think he'll get to Mod I (explained this)- made agreement if pt does, will reward with singing! But think this is unlikely- I would think about short term SNF-  6/21- con't PT and OT- therapy feels keeping another week will get him home- will d/w team conference today and see  if possible.  2.  Antithrombotics: -DVT/anticoagulation: Lovenox.  Vascular study reviewed and negative.  5/26- will need for at least 2 months- maybe 3 months total from surgery. 6/3- will need for a total of 3 months   6/20- went over again with pt.  3. Pain Management: Baclofen 5 mg 3 times daily, oxycodone and Flexeril as needed  5/26- pain pretty well controlled- con't regimen  5/28-   con't regimen prn  5/31- will do R shoulder injection today.  6/3- Shoulder injection 5/31- R shoulder pain almost gone with injection   6/7- Added tramadol- got rid of 10 mg Oxy-   6/10- taking tramadol- works for R >L knee, but shoulder now acting up again- will get xray and go from there- con't regimen  6/11- tramadol- not really taking oxy- con't regimen- add voltaren gel QID.   6/12- voltaren helped some- con't regimen  6/17- will add Lidoderm 1 patch 6pm to 6am-   6/21- will do TrP injection today for R lumbar paraspinals 4. Mood: Provide emotional support  5/26- pt has been tearful understandably- will try Celexa 10 mg daily  5/31- tolerating, will increase to 20 mg daily  6/6- happy with improving results, so brighter- con't regimen  6/15- pt insisting to d/c Celexa- will discontinue.              -antipsychotic agents: N/A 5. Neuropsych: This patient is capable of making decisions on his own behalf. 6. Skin/Wound Care: Routine skin checks 7. Fluids/Electrolytes/Nutrition: Routine in and outs with follow-up chemistries 8.  Atrial fibrillation.  Lopressor 25 mg twice daily.  Cardiac rate controlled  5/28 overall controlled- con't regimen  5/31- will  decrease Metoprolol to 12.5 mg daily (was qday) since heart rate on low side and BP low- don't want him orthostatic due to SCI.   6/10- HR 60 this AM- holding metoprolol more than giving. Likely worse due to SCI bradycardia- if doesn't improve, will call Cards next week.   6/17- HR 49 this AM- due to SCI bradycardia. 6/20- Bradycardia better today- con't off B blocker.   9.  Hypertension with orthostatic hypotension.  Cozaar 50 mg daily.  Monitor with increased mobility  5/28 elevated today.  5/30- BP 90s/50s- will monitor for orthostatic hypotension  5/31- will decrease metoprolol as above  6/8- per pt , no dizziness, however BP low- will stop Losartan and con't metoprolol since for rate control.   6/10- BP soft again 90/s50s- off losartan- only on Metoprolol lowest dose- will call Cards next week if doesn't improve.   6/12- HR 67- con't regimen Vitals:   08/01/20 2021 08/02/20 0446  BP: 114/83 108/79  Pulse: 84 (!) 49  Resp: 17 17  Temp: 97.8 F (36.6 C) 97.8 F (36.6 C)  SpO2: 99% 97%   6/21- BP HR 49- but was 84 las tnight- con't regimen 10.  BPH/UTI.  Was on Flomax 0.4 mg daily.   UTI greater than 80,000 Staphylococcus/Enterococcus completing course of Bactrim- please see neurogenic bladder-  11.  Surgical PCR screening positive.  Bactroban as directed 12. Hyperthyroidism- had lost 70 lbs in 6 months- in middle of treatment  6/1- per Endo, want labs checked before appointment 6/7- will order thyroid labs for tomorrow, so they are back for appointment.   6/2- TSH <0.010 and free T4 1.03- free T3 pending.   6/3- pt to have endo phone appt 6/9- asked him to let therapy know to schedule therapy around it.   6/7- has  appt today per pt- will let me know if there's any changes!  6/8- allowed to stop Prednisone and will wait on Synthroid for today.   6/14- stopping Protonix since pt doesn't need- was since previous Prednisone dose that's stopped.  13. Fatty liver-will need to be careful  with any medicine going through liver. Placed nursing order to request that protein shake recommended by his hepatologist be given at night  6/7- explained to pt that tylenol can affect liver, not the other way around.  14. Neurogenic bladder- will start on I/o caths q6 hours prn and increase Flomax to 0.8 mg with supper  6/7- will change order to cath if volumes >250cc  6/10- will call Urology for advise- is usually requiring caths still- but voiding some- don't want pt to get Foley (if goes to SNF).  6/16- no cath required since Monday- will might not need to go home with caths.   6/20- no caths for 7 days- but room odor of urine?will ask nursing/therapy.  6/21- will check U/A and Cx since room smells of urine and having more incontinence which can be due to bladder spasms, which can come with UTI? 15.  Neurogenic bowel- doesn't have good control- will start with suppository- and dig stim and see if can withdraw any of them over time.   6/2- Pt having good results with bowel program- still requiring some dig stim, but less.   6/5: add miralax mixed in prune juice to dinner.   6/6- BM on toilet with dig stim/supp- improving!- con't regimen  6/21- bowel program on toilet- working well 16. Spasticity- will increase Baclofen to 10 mg TID and con't Flexeril.   D/c'd flexeril  5/31- will d/w pt going up on baclofen cannot do zanaflex due to low BP and cannot do dantrolene due to liver issues.  6/14- spasms getting a little worse- wait to increase Baclofen since don't have much more to go up without side effects.   17. Orthostatic hypotension  5/26- added TEDs and Abd binder during day.  5/31- decreased metoprolol   6/8- will stop Losartan 18. Ileus     Resolved tolerates Regular diet 19. R shoulder pain  5/30- will try to do R shoulder injection tomorrow 5/31- R shoulder injections for subacromial and RTC/posterior shoulder injection today  6/2-6/5 pain MUCH better- continue  regimen 5/31-received steroid injection.   6/10- will get shoulder xray as above- and if need be, MRI of R shoulder since pain has come back.   6/11- xray shows likely RTC injury and significant DJD of R shoulder- GH and AC- will start Voltaren gel QID.  20. Nausea: resolved: changed valium and reglan to PRN 21. R>L knee pain  6/7- added tramadol 50 mg q6 hours prn and stopped Oxycodone 10 mg- take oxy only if severe pain; try Tramadol, not tlyenol when hurting BEFORE therapy.  6/10- doing well with tramadol c-on't regimen 22. Back pain  6/16- has trigger point - will try therapy- if not, will try trigger point injection  6/20- will see if needs to injections- 50% better- will see tomorrow 6/18 some relief with flexeril 23. Dispo  6/8- spent a long time going over that we, as a team, think he would benefit from more time at SNF- for 1-2 months, not long term- OT offered to speak with pt about this.   6/11- d/w pt and wife who were in room- will see how he does this next week.   6/14- team conference today  to discuss progress.   6/15- d/c 6/22- for now, but will see how doing next week  6/17- set up insurance to cover until 6/22- filled out handicapped placard.  24.  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    LOS: 28 days A FACE TO FACE EVALUATION WAS PERFORMED  Darren Harrison 08/02/2020, 10:00 AM

## 2020-08-03 MED ORDER — APIXABAN 5 MG PO TABS
5.0000 mg | ORAL_TABLET | Freq: Two times a day (BID) | ORAL | Status: DC
  Administered 2020-08-03 – 2020-08-09 (×13): 5 mg via ORAL
  Filled 2020-08-03 (×13): qty 1

## 2020-08-03 NOTE — Progress Notes (Signed)
Recreational Therapy Session Note  Patient Details  Name: Darren Harrison MRN: [id number] Date of Birth: 05/08/1955 Today's Date: 08/03/2020  Pain: c/o back pain, team aware, addressing Skilled Therapeutic Interventions/Progress Updates: Met with pt t& COTA to discuss community reintegration/outing including purpose and potential goals.  Pt agreeable to participate if LOS is extended.  SIMPSON,LISA 08/03/2020, 12:09 PM  

## 2020-08-03 NOTE — Progress Notes (Signed)
PROGRESS NOTE   Subjective/Complaints:  Pt has UTI- waiting for urine Cx.  Said back pain still hurting, but down to 3-4/10 as compared to 6-7/10 before injections yesterday.  Still having a few spasms, mainly in the morning- not painful.     ROS:   Pt denies SOB, abd pain, CP, N/V/C/D, and vision changes  Objective:   No results found. Recent Labs    08/01/20 0543  WBC 3.2*  HGB 10.9*  HCT 33.3*  PLT 132*     Recent Labs    08/01/20 0543  NA 138  K 4.1  CL 103  CO2 27  GLUCOSE 88  BUN 13  CREATININE 0.67  CALCIUM 8.9      Intake/Output Summary (Last 24 hours) at 08/03/2020 0847 Last data filed at 08/03/2020 0729 Gross per 24 hour  Intake 740 ml  Output 1225 ml  Net -485 ml         Physical Exam: Vital Signs Blood pressure 116/76, pulse (!) 55, temperature 97.9 F (36.6 C), temperature source Oral, resp. rate 16, height 5\' 10"  (1.778 m), weight 74.8 kg, SpO2 98 %.      General: awake, alert, appropriate, NAD HENT: conjugate gaze; oropharynx moist CV: irregular; bradycardic rate; no JVD Pulmonary: CTA B/L; no W/R/R- good air movement GI: soft, NT, ND, (+)BS Psychiatric: appropriate Neurological: Ox3  Musculoskeletal:still TTP R low paraspinals Bilateral knee varus deformity ;    General: TTP over R shoulder- over biceps tendon as well as R GH joint and AC joint still TTP; crepitus with ROM     Comments: RUE- biceps 3/5, WE 4-/5, triceps 3/5, grip 3+/5, and FA 1/5- developing muscle atrophy in RUE as well as in teres and upper traps on R>L LUE- all muscles 4+/5 RLE- HF 3-/5, KE 3+/5, DF 3-/5, and PF 3-/5, LLE- HF 4+/5, KE 5-/5, DF 4/5 and PF 3-/5;  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 Still having a few spasms/extensor tone-  Sensation: RUE C4 ok- decreased from C5 to S1 on R LUE- C5 OK- decreased from C6 to S1 on L    Assessment/Plan: 1. Functional deficits which require 3+ hours per day of interdisciplinary therapy in a comprehensive inpatient rehab setting. Physiatrist is providing close team supervision and 24 hour management of active medical problems listed below. Physiatrist and rehab team continue to assess barriers to discharge/monitor patient progress toward functional and medical goals  Care Tool:  Bathing  Bathing activity did not occur: Refused Body parts bathed by patient: Right arm, Left arm, Chest, Abdomen, Front perineal area, Right upper leg, Left upper leg, Face, Right lower leg, Left lower leg   Body parts bathed by helper: Buttocks  Bathing assist Assist Level: Minimal Assistance - Patient > 75%     Upper Body Dressing/Undressing Upper body dressing   What is the patient wearing?: Pull over shirt, Orthosis    Upper body assist Assist Level: Minimal Assistance - Patient > 75%    Lower Body Dressing/Undressing Lower body dressing      What is the patient wearing?: Pants     Lower body assist Assist for lower body dressing: Minimal Assistance - Patient > 75%     Toileting Toileting    Toileting assist Assist for toileting: Minimal Assistance - Patient > 75%     Transfers Chair/bed transfer  Transfers assist     Chair/bed transfer assist level: Supervision/Verbal cueing (slide board)     Locomotion Ambulation   Ambulation assist   Ambulation activity did not occur: Safety/medical concerns  Assist level: Dependent - Patient 0% Assistive device: Lite Gait Max distance: 200'   Walk 10 feet  activity   Assist  Walk 10 feet activity did not occur: Safety/medical concerns  Assist level: Dependent - Patient 0% Assistive device: Lite Gait   Walk 50 feet activity   Assist Walk 50 feet with 2 turns activity did not occur: Safety/medical concerns  Assist level: Dependent - Patient 0% Assistive device: Lite Gait    Walk 150 feet activity   Assist Walk 150 feet activity did not occur: Safety/medical concerns  Assist level: Dependent - Patient 0% Assistive device: Lite Gait    Walk 10 feet on uneven surface  activity   Assist Walk 10 feet on uneven surfaces activity did not occur: Safety/medical concerns         Wheelchair     Assist Will patient use wheelchair at discharge?: Yes Type of Wheelchair: Manual    Wheelchair assist level: Supervision/Verbal cueing Max wheelchair distance: 150'    Wheelchair 50 feet with 2 turns activity    Assist        Assist Level: Supervision/Verbal cueing   Wheelchair 150 feet activity     Assist      Assist Level: Supervision/Verbal cueing   Blood pressure 116/76, pulse (!) 55, temperature 97.9 F (36.6 C), temperature source Oral, resp. rate 16, height 5\' 10"  (1.778 m), weight 74.8 kg, SpO2 98 %.  Medical Problem List and Plan: 1. 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  Status post C3-4 laminectomy with posterior arthrodesis cervical instrumentation 06/29/2020.  Cervical brace as directed.             -patient may  Shower with neck brace/incision covered             -ELOS/Goals: ~3 weeks- min A- -will order PRAFOs and have pt wear nightly to prevent ankle contractures  5/26- con't PT and OT- spent 20 minutes going over ASIA level, and info about SCI including neurogenic bowel and bladder, spasticity and increased risk of DVT/pressure ulcers.   --con't PT and OT- discussed at length with pt goal is Min A- if so, will need 24/7 A at home- I don't think he'll  get to Mod I (explained this)- made agreement if pt does, will reward with singing! But think this is unlikely- I would think about short term SNF-   6/22 con't PT and OT- 1 more week will get him home, as compared to sending to SNF.  2.  Antithrombotics: -DVT/anticoagulation: Lovenox.  Vascular study reviewed and negative.  5/26- will need for at least 2 months- maybe 3 months  total from surgery. 6/3- will need for a total of 3 months  6/22- will switch to Eliquis due to Afib and stop lovenox.  3. Pain Management: Baclofen 5 mg 3 times daily, oxycodone and Flexeril as needed  5/26- pain pretty well controlled- con't regimen  5/28-   con't regimen prn  5/31- will do R shoulder injection today.  6/3- Shoulder injection 5/31- R shoulder pain almost gone with injection   6/7- Added tramadol- got rid of 10 mg Oxy-   6/10- taking tramadol- works for R >L knee, but shoulder now acting up again- will get xray and go from there- con't regimen  6/11- tramadol- not really taking oxy- con't regimen- add voltaren gel QID.   6/22- pain down to 3-4/10; from 6-7/10.  4. Mood: Provide emotional support  6/15- pt insisting to d/c Celexa- will discontinue.              -antipsychotic agents: N/A 5. Neuropsych: This patient is capable of making decisions on his own behalf. 6. Skin/Wound Care: Routine skin checks 7. Fluids/Electrolytes/Nutrition: Routine in and outs with follow-up chemistries 8.  Atrial fibrillation.  Lopressor 25 mg twice daily.  Cardiac rate controlled  5/28 overall controlled- con't regimen  5/31- will decrease Metoprolol to 12.5 mg daily (was qday) since heart rate on low side and BP low- don't want him orthostatic due to SCI.   6/10- HR 60 this AM- holding metoprolol more than giving. Likely worse due to SCI bradycardia- if doesn't improve, will call Cards next week.   6/17- HR 49 this AM- due to SCI bradycardia. 6/20- Bradycardia better today- con't off B blocker.   6/22- will change  to Eliquis from Lovenox -since back in Afib.  9.  Hypertension with orthostatic hypotension.  Cozaar 50 mg daily.  Monitor with increased mobility  5/28 elevated today.  5/30- BP 90s/50s- will monitor for orthostatic hypotension  5/31- will decrease metoprolol as above  6/8- per pt , no dizziness, however BP low- will stop Losartan and con't metoprolol since for rate control.   6/10- BP soft again 90/s50s- off losartan- only on Metoprolol lowest dose- will call Cards next week if doesn't improve.   6/12- HR 67- con't regimen Vitals:   08/02/20 1956 08/03/20 0303  BP: 110/77 116/76  Pulse: 61 (!) 55  Resp: 17 16  Temp: 98.7 F (37.1 C) 97.9 F (36.6 C)  SpO2: 97% 98%   6/22- Heart rate in 60s this AM- will start Eliquis since back in Afib.  10.  BPH/UTI.  Was on Flomax 0.4 mg daily.   UTI greater than 80,000 Staphylococcus/Enterococcus completing course of Bactrim- please see neurogenic bladder-  11.  Surgical PCR screening positive.  Bactroban as directed 12. Hyperthyroidism- had lost 70 lbs in 6 months- in middle of treatment  6/1- per Endo, want labs checked before appointment 6/7- will order thyroid labs for tomorrow, so they are back for appointment.   6/2- TSH <0.010 and free T4 1.03- free T3 pending.   6/3- pt to have endo phone appt 6/9- asked him to let therapy know to schedule therapy around it.   6/7- has appt today per pt- will let me know if there's any changes!  6/8- allowed to stop Prednisone and will wait on Synthroid for today.   6/14- stopping Protonix since pt doesn't need- was since previous Prednisone dose that's stopped.  13. Fatty liver-will need to be careful with any medicine going through liver. Placed nursing order  to request that protein shake recommended by his hepatologist be given at night  6/7- explained to pt that tylenol can affect liver, not the other way around.  14. Neurogenic bladder- will start on I/o caths q6 hours prn and increase Flomax to 0.8 mg  with supper  6/7- will change order to cath if volumes >250cc  6/10- will call Urology for advise- is usually requiring caths still- but voiding some- don't want pt to get Foley (if goes to SNF).  6/16- no cath required since Monday- will might not need to go home with caths.   6/20- no caths for 7 days- but room odor of urine?will ask nursing/therapy.  6/21- will check U/A and Cx since room smells of urine and having more incontinence which can be due to bladder spasms, which can come with UTI? 6/22- has UTI- waiting for Cx.  15.  Neurogenic bowel- doesn't have good control- will start with suppository- and dig stim and see if can withdraw any of them over time.   6/2- Pt having good results with bowel program- still requiring some dig stim, but less.   6/5: add miralax mixed in prune juice to dinner.   6/6- BM on toilet with dig stim/supp- improving!- con't regimen  6/21- bowel program on toilet- working well 16. Spasticity- will increase Baclofen to 10 mg TID and con't Flexeril.   D/c'd flexeril  5/31- will d/w pt going up on baclofen cannot do zanaflex due to low BP and cannot do dantrolene due to liver issues.  6/14- spasms getting a little worse- wait to increase Baclofen since don't have much more to go up without side effects.   17. Orthostatic hypotension  5/26- added TEDs and Abd binder during day.  5/31- decreased metoprolol   6/8- will stop Losartan 18. Ileus     Resolved tolerates Regular diet 19. R shoulder pain  5/30- will try to do R shoulder injection tomorrow 5/31- R shoulder injections for subacromial and RTC/posterior shoulder injection today  6/2-6/5 pain MUCH better- continue regimen 5/31-received steroid injection.   6/10- will get shoulder xray as above- and if need be, MRI of R shoulder since pain has come back.   6/11- xray shows likely RTC injury and significant DJD of R shoulder- GH and AC- will start Voltaren gel QID.  20. Nausea: resolved: changed valium  and reglan to PRN 21. R>L knee pain  6/7- added tramadol 50 mg q6 hours prn and stopped Oxycodone 10 mg- take oxy only if severe pain; try Tramadol, not tlyenol when hurting BEFORE therapy.  6/10- doing well with tramadol c-on't regimen 22. Back pain  6/16- has trigger point - will try therapy- if not, will try trigger point injection  6/20- will see if needs to injections- 50% better- will see tomorrow 6/18 some relief with flexeril  6/22- pain down to 3-4/10 - will con't regimen 23. Dispo  6/8- spent a long time going over that we, as a team, think he would benefit from more time at SNF- for 1-2 months, not long term- OT offered to speak with pt about this.   6/11- d/w pt and wife who were in room- will see how he does this next week.   6/14- team conference today to discuss progress.   6/15- d/c 6/22- for now, but will see how doing next week  6/17- set up insurance to cover until 6/22- filled out handicapped placard.  24.  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

## 2020-08-03 NOTE — Progress Notes (Signed)
Patient ID: Darren Harrison, male   DOB: January 16, 1956, 65 y.o.   MRN: [id number]  SW received return call from Yolanda/Registration lead with [facility] who reported pt will need a referral from his surgeon or PCP in order to be seen. SW will discuss further with patient.   *SW met with pt in room to discuss above. Pt reported that he will call his PCP.   SW received call from pt stating his PCP needed more information to make the referral; SW to follow-up to discuss furhter. PCP is Dr. Waldron Session [phone] option #5 for nursing line.  SW left message for Butch Penny to discuss further and requested return call.   Loralee Pacas, MSW, [place] Office: [phone] Cell: [phone] Fax: [phone]

## 2020-08-03 NOTE — Progress Notes (Signed)
Bowel program complete. Pt had large bowel movement.

## 2020-08-03 NOTE — Progress Notes (Signed)
Physical Therapy Weekly Progress Note  Patient Details  Name: Darren Harrison MRN: [id number] Date of Birth: 07/08/1955  Beginning of progress report period: July 27, 2020 End of progress report period: August 03, 2020  Today's Date: 08/03/2020 PT Individual Time: 0905-1000 PT Individual Time Calculation (min): 55 min   Patient did not have STG set this past week, STG were set to LTG due to ELOS. Pt's LOS has been extended by one week due to continued progress and potential ability to reach a greater level of independence and decrease caregiver burden. An extended LOS would also allow patient to d/c home rather than to require a stay at a SNF prior to d/c home. Darren Harrison is currently able to perform bed mobility at Supervision level with use of a bedrail, perform slide board transfers at Supervision level, perform w/c mobility at a Supervision to mod I level with use of BUE. Pt was able to participate in a seating evaluation this week so that he can receive an appropriate w/c to d/c home with. Pt is able to perform sit to stand with max A in // bars and perform short distance gait in // bars with assist x 2 for safety. Pt is not currently a functional ambulator and will not be by the time he d/c home. Pt remains very motivated and exhibits great participation in therapy sessions.  Patient continues to demonstrate the following deficits muscle weakness and muscle joint tightness, decreased cardiorespiratoy endurance, impaired timing and sequencing, abnormal tone, and unbalanced muscle activation, and decreased sitting balance, decreased standing balance, decreased postural control, and decreased balance strategies and therefore will continue to benefit from skilled PT intervention to increase functional independence with mobility.  Patient progressing toward long term goals..  Continue plan of care.  PT Short Term Goals Week 4:  PT Short Term Goal 1 (Week 4): =LTG due to ELOS PT Short Term Goal 1 -  Progress (Week 4): Progressing toward goal Week 5:  PT Short Term Goal 1 (Week 5): =LTG due to ELOS  Skilled Therapeutic Interventions/Progress Updates:    Pt received seated in bed, agreeable to PT session. Pt reports 2/10 pain in R low back at rest, declines intervention. Supine to sit with Supervision with increased time needed to complete transfer, use of bedrail. Assisted pt with donning knee high TEDs, he is able to don shoes while seated EOB with setup A. Pt able to don hard collar with min A. Slide board transfer bed to w/c with Supervision, patient able to place board for transfer. Manual w/c propulsion at Supervision level with use of BUE. Utilized w/c gloves this session for improved hand protection during w/c propulsion. Squat pivot transfer w/c to mat table with mod A. Pt has onset of R lower back pain following transfer. Palpable trigger points in R low back region. Sit to supine to sidelying with Supervision. Performed manual therapy with trigger point release and STM to QL, paraspinals, etc. Pt reports improvement in pain symptoms following manual therapy. Reviewed UE and LE ROM and strength to complete seating evaluation. Slide board transfer back to w/c with Supervision. Pt left seated in w/c in room with needs in reach, hot pack to R lower back, needs in reach at end of session.  Therapy Documentation Precautions:  Precautions Precautions: Cervical, Fall Precaution Comments: reviewed precautions Required Braces or Orthoses: Cervical Brace Cervical Brace: Hard collar, Other (comment) (donned in sitting) Restrictions Weight Bearing Restrictions: No    Therapy/Group: Individual Therapy  [name], PT, DPT, CSRS  08/03/2020, 12:18 PM

## 2020-08-03 NOTE — Progress Notes (Signed)
Physical Therapy Session Note  Patient Details  Name: Darren Harrison MRN: [id number] Date of Birth: 04/11/1955  Today's Date: 08/03/2020 PT Individual Time: 1300-1400 PT Individual Time Calculation (min): 60 min   Short Term Goals: Week 4:  PT Short Term Goal 1 (Week 4): =LTG due to ELOS PT Short Term Goal 1 - Progress (Week 4): Progressing toward goal  Skilled Therapeutic Interventions/Progress Updates: Pt presents supine in bed and agreeable to therapy.  Pt transfers sup to sit w/ supervision and scoots to EOB.  Pt able to place SB for transfer bed > w/c.  Pt cued on pushing onto SB w/ leading hand 2/2 slick surface of new equipment.  Pt able to transfer self w/ supervision although close supervision 2/2 above.  Pt wheeled self out of room and down hallways to 75'.  PT then continued into main gym.  Pt taken to // bars and performed multiple sit to stand transfers w/ max A and blocking of B knees.  Pt then able to amb in bars x 6' w/ mod A and blocking of stance leg, although LLE much stronger than R.  X 3 episodes of R knee buckling.  Pt practiced scooting forward and back in w/c clearing bottom for improved performance.  Pt performed pushing self backwards in w/c w/ LES only x 75', then forward using only LEs.  Pt returned to room and transferred w/c > bed w/ SB, close supervision 2/2 slick surface.  Supervision for sit to supine and bed alarm on w/ all needs in reach.  Balance/vestibular training;Community reintegration;Discharge planning;Disease management/prevention;DME/adaptive equipment instruction;Functional electrical stimulation;Functional mobility training;Neuromuscular re-education;Pain management;Patient/family education;Psychosocial support;Splinting/orthotics;Therapeutic Activities;Therapeutic Exercise;UE/LE Strength taining/ROM;UE/LE Coordination activities;Wheelchair propulsion/positioning;Ambulation/gait training   Therapy Documentation Precautions:  Precautions Precautions:  Cervical, Fall Precaution Comments: reviewed precautions Required Braces or Orthoses: Cervical Brace Cervical Brace: Hard collar, Other (comment) (donned in sitting) Restrictions Weight Bearing Restrictions: No General:   Vital Signs: Therapy Vitals Temp: 98.4 F (36.9 C) Pulse Rate: 71 Resp: 17 BP: 107/78 Patient Position (if appropriate): Lying Oxygen Therapy SpO2: 98 % O2 Device: Room Air Pain: no c/o pain.       Therapy/Group: Individual Therapy  Ladoris Gene 08/03/2020, 3:52 PM

## 2020-08-03 NOTE — Progress Notes (Signed)
Occupational Therapy Session Note  Patient Details  Name: Darren Harrison MRN: [id number] Date of Birth: 1955/05/15  Today's Date: 08/03/2020 OT Individual Time: 8003-4917 OT Individual Time Calculation (min): 70 min    Short Term Goals: Week 4:  OT Short Term Goal 1 (Week 4): STG=LTG secondary to ELOS  Skilled Therapeutic Interventions/Progress Updates:    Pt resting in w/c upon arrival. Pt reported pain per below but relieved after SB transfer to EOM. OT intervention with focus on SB transfers, lateral leans with stretching, discharge planning, community outing discussion, BLE/BUE therex, and w/c mobility to increase independence with BADLs. SB transfers with close supervision. Lateral leans and crossing ipisilateral LE to facilitate low back stretch for pain mgmt. Pt completed with supervision. Recreational Therapist stopped by and engaged in conversation regarding community outing on 6/24. Pt receptive and scheduled. Pt propelled w/c to day room and transferred to [employer] with CGA. 5 min level 5 BLE/BUE and 5 min level 2 BLE only. Demonstrated theraband exercises but pt with limited shoulder AROM and unable to complete. Pt propelled w/c back to room and transferred back to bed with supervision. Single use heat pack applied to Rt lower back. Pt remained in bed with all needs within reach and bed alarm activated.   Therapy Documentation Precautions:  Precautions Precautions: Cervical, Fall Precaution Comments: reviewed precautions Required Braces or Orthoses: Cervical Brace Cervical Brace: Hard collar, Other (comment) (donned in sitting) Restrictions Weight Bearing Restrictions: No  Pain: Pt c/o Rt lower back pain at beginning of session; relief with stretching and activity   Therapy/Group: Individual Therapy  [name] 08/03/2020, 12:03 PM

## 2020-08-04 NOTE — Progress Notes (Signed)
Occupational Therapy Session Note  Patient Details  Name: Darren Harrison MRN: [id number] Date of Birth: [birth year]  Today's Date: 08/04/2020 OT Individual Time: 0930-1015 OT Individual Time Calculation (min): 45 min    Short Term Goals: Week 4:  OT Short Term Goal 1 (Week 4): STG=LTG secondary to ELOS  Skilled Therapeutic Interventions/Progress Updates:    Pt resting in w/c upon arrival. [employer] had delievered pt's loaner w/c and pt performed SB tranfser back to bed and bed to loaner w/c. Reviewed features and operation of w/c. SB transfers with supervision. Pt propelled w/c to ortho gym and engaged in 5 mins on SciFit level 2 for joint mobilization and strengthening. Pt propelled w/c to main gym and then to room. Pt propelled w/c from ortho gym to main gym and back to nursing station. Pt returned to room and remained in w/c. Pt issued larger pair of w/c gloves. All needs within reach.   Therapy Documentation Precautions:  Precautions Precautions: Cervical, Fall Precaution Comments: reviewed precautions Required Braces or Orthoses: Cervical Brace Cervical Brace: Hard collar, Other (comment) (donned in sitting) Restrictions Weight Bearing Restrictions: No    Pain: Pain Assessment Pain Scale: 0-10 Pain Score: 5  Pain Type: Acute pain Pain Location: Back Pain Orientation: Lower;Right Pain Onset: On-going Pain Intervention(s): stretching and repositioned   Therapy/Group: Individual Therapy  [name] 08/04/2020, 10:39 AM

## 2020-08-04 NOTE — Progress Notes (Addendum)
Physical Therapy Session Note  Patient Details  Name: Darren Harrison MRN: [id number] Date of Birth: 1955/07/06  Today's Date: 08/04/2020 PT Individual Time: 1101-1200 PT Individual Time Calculation (min): 59 min   Short Term Goals: Week 4:  PT Short Term Goal 1 (Week 4): =LTG due to ELOS PT Short Term Goal 1 - Progress (Week 4): Progressing toward goal  Skilled Therapeutic Interventions/Progress Updates: Pt presents sitting in w/c and agreeable to therapy.  Pt wheels self 150+ feet w/ supervision to Dayroom, PT completed 2/2 fatigue.  Pt transferred w/c <> nustep w/ min A to CGA performing squat pivot w/o slide board.  Pt performed 4' at Level 2 for Les only f/b 4' at Level 1.  Pt had no c/o.  Pt wheeled self 150+ to small gym,  but no room.  PT wheeled pt to room for practice w/ SB transfers.  Pt perrmed w/c <> bed transfers w/ CGA and c/o fatigued Les.  Pt performing all w/c parts w/ occasional assist.  Pt problem solving for positioning of w/c, and positioning after transfer for ability to keep it close when home alone. Pt required reminders for reverse log roll to protect C/spine.  Pt able to remove Aspen brace and shoes.  Pt remained supine in bed w/ bed alarm on and all needs in reacgh.     Therapy Documentation Precautions:  Precautions Precautions: Cervical, Fall Precaution Comments: reviewed precautions Required Braces or Orthoses: Cervical Brace Cervical Brace: Hard collar, Other (comment) (donned in sitting) Restrictions Weight Bearing Restrictions: No General:   Vital Signs:   Pain: no c/o pain, although had back pain earlier.        Therapy/Group: Individual Therapy  Ladoris Gene 08/04/2020, 12:07 PM

## 2020-08-04 NOTE — Progress Notes (Signed)
PROGRESS NOTE   Subjective/Complaints:   Urine Cx E Coli- reincubating for better growth for sensitivities.    Pt reports back was hurting this AM- ~ 6/10- but took pain meds- is much better with meds.   Lidocaine patches somewhat helpful when uses them  Bowel program going OK- no cathing for 11 days.    ROS:   Pt denies SOB, abd pain, CP, N/V/C/D, and vision changes   Objective:   No results found. No results for input(s): WBC, HGB, HCT, PLT in the last 72 hours.    No results for input(s): NA, K, CL, CO2, GLUCOSE, BUN, CREATININE, CALCIUM in the last 72 hours.     Intake/Output Summary (Last 24 hours) at 08/04/2020 0821 Last data filed at 08/04/2020 0515 Gross per 24 hour  Intake 600 ml  Output 1950 ml  Net -1350 ml         Physical Exam: Vital Signs Blood pressure 111/71, pulse 75, temperature 97.7 F (36.5 C), resp. rate 17, height 5\' 10"  (1.778 m), weight 74.8 kg, SpO2 99 %.       General: awake, alert, appropriate, sitting up in shower, getting washed with help of OT; NAD HENT: conjugate gaze; oropharynx moist CV: regular rate; no JVD Pulmonary: CTA B/L; no W/R/R- good air movement GI: soft, NT, ND, (+)BS Psychiatric: appropriate; interactive Neurological: alert; a few spasms seen this AM  Musculoskeletal:still TTP R low paraspinals- no change Bilateral knee varus deformity ;    General: TTP over R shoulder- over biceps tendon as well as R GH joint and AC joint still TTP; crepitus with ROM     Comments: RUE- biceps 3/5, WE 4-/5, triceps 3/5, grip 3+/5, and FA 1/5- developing muscle atrophy in RUE as well as in teres and upper traps on R>L LUE- all muscles 4+/5 RLE- HF 3-/5, KE 3+/5, DF 3-/5, and PF 3-/5, LLE- HF 4+/5, KE 5-/5, DF 4/5 and PF 3-/5;  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 Still having a few spasms/extensor tone-  Sensation: RUE C4 ok- decreased from C5 to S1 on R LUE- C5 OK- decreased from C6 to S1 on L    Assessment/Plan: 1. Functional deficits which require 3+ hours per day of interdisciplinary therapy in a comprehensive inpatient rehab setting. Physiatrist is providing close team supervision and 24 hour management of active medical problems listed below. Physiatrist and rehab team continue to assess barriers to discharge/monitor patient progress toward functional and medical goals  Care Tool:  Bathing  Bathing activity did not occur: Refused Body parts bathed by patient: Right arm, Left arm, Chest, Abdomen, Front perineal area, Right upper leg, Left upper leg, Face, Right lower leg, Left lower leg   Body  parts bathed by helper: Buttocks     Bathing assist Assist Level: Minimal Assistance - Patient > 75%     Upper Body Dressing/Undressing Upper body dressing   What is the patient wearing?: Pull over shirt, Orthosis    Upper body assist Assist Level: Minimal Assistance - Patient > 75%    Lower Body Dressing/Undressing Lower body dressing      What is the patient wearing?: Pants     Lower body assist Assist for lower body dressing: Minimal Assistance - Patient > 75%     Toileting Toileting    Toileting assist Assist for toileting: Minimal Assistance - Patient > 75%     Transfers Chair/bed transfer  Transfers assist     Chair/bed transfer assist level: Supervision/Verbal cueing (w/ slide board.)     Locomotion Ambulation   Ambulation assist   Ambulation activity did not occur: Safety/medical concerns  Assist  level: Moderate Assistance - Patient 50 - 74% Assistive device: Parallel bars Max distance: 6   Walk 10 feet activity   Assist  Walk 10 feet activity did not occur: Safety/medical concerns  Assist level: Dependent - Patient 0% Assistive device: Lite Gait   Walk 50 feet activity   Assist Walk 50 feet with 2 turns activity did not occur: Safety/medical concerns  Assist level: Dependent - Patient 0% Assistive device: Lite Gait    Walk 150 feet activity   Assist Walk 150 feet activity did not occur: Safety/medical concerns  Assist level: Dependent - Patient 0% Assistive device: Lite Gait    Walk 10 feet on uneven surface  activity   Assist Walk 10 feet on uneven surfaces activity did not occur: Safety/medical concerns         Wheelchair     Assist Will patient use wheelchair at discharge?: Yes Type of Wheelchair: Manual    Wheelchair assist level: Supervision/Verbal cueing Max wheelchair distance: 75    Wheelchair 50 feet with 2 turns activity    Assist        Assist Level: Supervision/Verbal cueing   Wheelchair 150 feet activity     Assist      Assist Level: Supervision/Verbal cueing   Blood pressure 111/71, pulse 75, temperature 97.7 F (36.5 C), resp. rate 17, height 5\' 10"  (1.778 m), weight 74.8 kg, SpO2 99 %.  Medical Problem List and Plan: 1. 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  Status post C3-4 laminectomy with posterior arthrodesis cervical instrumentation 06/29/2020.  Cervical brace as directed.             -patient may  Shower with neck brace/incision covered             -ELOS/Goals: ~3 weeks- min A- -will order PRAFOs and have pt wear nightly to prevent ankle contractures  5/26- con't PT and OT- spent 20 minutes going over ASIA level, and info about SCI including neurogenic bowel and bladder, spasticity and increased risk of DVT/pressure ulcers.   --con't PT and OT- discussed at  length with pt goal is Min A- if so, will need 24/7 A at home- I don't think he'll get to Mod I (explained this)- made agreement if pt does, will reward with singing! But think this is unlikely- I would think about short term SNF-   6/23- trying to keep til next Wednesday/Thursday  since we truly feel this will get pt home by then- con't PT and OT  2.  Antithrombotics: -DVT/anticoagulation: Lovenox.  Vascular study reviewed  and negative.  5/26- will need for at least 2 months- maybe 3 months total from surgery. 6/3- will need for a total of 3 months  6/22- will switch to Eliquis due to Afib and stop lovenox.  3. Pain Management: Baclofen 5 mg 3 times daily, oxycodone and Flexeril as needed  5/26- pain pretty well controlled- con't regimen  5/28-   con't regimen prn  5/31- will do R shoulder injection today.  6/3- Shoulder injection 5/31- R shoulder pain almost gone with injection   6/7- Added tramadol- got rid of 10 mg Oxy-   6/10- taking tramadol- works for R >L knee, but shoulder now acting up again- will get xray and go from there- con't regimen  6/23- taking tramadol prn- back pain more this AM- 6/10- con't regimen prn 4. Mood: Provide emotional support  6/15- pt insisting to d/c Celexa- will discontinue.              -antipsychotic agents: N/A 5. Neuropsych: This patient is capable of making decisions on his own behalf. 6. Skin/Wound Care: Routine skin checks 7. Fluids/Electrolytes/Nutrition: Routine in and outs with follow-up chemistries 8.  Atrial fibrillation.  Lopressor 25 mg twice daily.  Cardiac rate controlled  5/28 overall controlled- con't regimen  5/31- will decrease Metoprolol to 12.5 mg daily (was qday) since heart rate on low side and BP low- don't want him orthostatic due to SCI.   6/10- HR 60 this AM- holding metoprolol more than giving. Likely worse due to SCI bradycardia- if doesn't improve, will call Cards next week.   6/17- HR 49 this AM- due to SCI  bradycardia. 6/20- Bradycardia better today- con't off B blocker.   6/22- will change to Eliquis from Lovenox -since back in Afib.   6/23- HR good this AM- con't regimen 9.  Hypertension with orthostatic hypotension.  Cozaar 50 mg daily.  Monitor with increased mobility  5/28 elevated today.  5/30- BP 90s/50s- will monitor for orthostatic hypotension  5/31- will decrease metoprolol as above  6/8- per pt , no dizziness, however BP low- will stop Losartan and con't metoprolol since for rate control.   6/10- BP soft again 90/s50s- off losartan- only on Metoprolol lowest dose- will call Cards next week if doesn't improve.   6/12- HR 67- con't regimen Vitals:   08/03/20 1940 08/04/20 0514  BP: 109/70 111/71  Pulse: 87 75  Resp: 16 17  Temp: 98.3 F (36.8 C) 97.7 F (36.5 C)  SpO2: 99% 99%   6/22- Heart rate in 60s this AM- will start Eliquis since back in Afib.  10.  BPH/UTI.  Was on Flomax 0.4 mg daily.   UTI greater than 80,000 Staphylococcus/Enterococcus completing course of Bactrim- please see neurogenic bladder-  11.  Surgical PCR screening positive.  Bactroban as directed 12. Hyperthyroidism- had lost 70 lbs in 6 months- in middle of treatment  6/1- per Endo, want labs checked before appointment 6/7- will order thyroid labs for tomorrow, so they are back for appointment.   6/2- TSH <0.010 and free T4 1.03- free T3 pending.   6/3- pt to have endo phone appt 6/9- asked him to let therapy know to schedule therapy around it.   6/7- has appt today per pt- will let me know if there's any changes!  6/8- allowed to stop Prednisone and will wait on Synthroid for today.   6/14- stopping Protonix since pt doesn't need- was since previous Prednisone dose that's stopped.  13. Fatty liver-will need  to be careful with any medicine going through liver. Placed nursing order to request that protein shake recommended by his hepatologist be given at night  6/7- explained to pt that tylenol can affect  liver, not the other way around.  14. Neurogenic bladder- will start on I/o caths q6 hours prn and increase Flomax to 0.8 mg with supper  6/7- will change order to cath if volumes >250cc  6/10- will call Urology for advise- is usually requiring caths still- but voiding some- don't want pt to get Foley (if goes to SNF).  6/16- no cath required since Monday- will might not need to go home with caths.   6/20- no caths for 7 days- but room odor of urine?will ask nursing/therapy.  6/21- will check U/A and Cx since room smells of urine and having more incontinence which can be due to bladder spasms, which can come with UTI? 6/22- has UTI- waiting for Cx.  6/23- urine Cx being re-incubated-will maintain Bactrim and monitor- room doesn't smell of urine anymore/today 15.  Neurogenic bowel- doesn't have good control- will start with suppository- and dig stim and see if can withdraw any of them over time.   6/2- Pt having good results with bowel program- still requiring some dig stim, but less.   6/5: add miralax mixed in prune juice to dinner.   6/6- BM on toilet with dig stim/supp- improving!- con't regimen  6/21- bowel program on toilet- working well 16. Spasticity- will increase Baclofen to 10 mg TID and con't Flexeril.   D/c'd flexeril  5/31- will d/w pt going up on baclofen cannot do zanaflex due to low BP and cannot do dantrolene due to liver issues.  6/14- spasms getting a little worse- wait to increase Baclofen since don't have much more to go up without side effects.   17. Orthostatic hypotension  5/26- added TEDs and Abd binder during day.  5/31- decreased metoprolol   6/8- will stop Losartan 18. Ileus     Resolved tolerates Regular diet 19. R shoulder pain  5/30- will try to do R shoulder injection tomorrow 5/31- R shoulder injections for subacromial and RTC/posterior shoulder injection today  6/2-6/5 pain MUCH better- continue regimen 5/31-received steroid injection.   6/10- will get  shoulder xray as above- and if need be, MRI of R shoulder since pain has come back.   6/11- xray shows likely RTC injury and significant DJD of R shoulder- GH and AC- will start Voltaren gel QID.  20. Nausea: resolved: changed valium and reglan to PRN 21. R>L knee pain  6/7- added tramadol 50 mg q6 hours prn and stopped Oxycodone 10 mg- take oxy only if severe pain; try Tramadol, not tlyenol when hurting BEFORE therapy.  6/10- doing well with tramadol c-on't regimen 22. Back pain  6/16- has trigger point - will try therapy- if not, will try trigger point injection  6/20- will see if needs to injections- 50% better- will see tomorrow 6/18 some relief with flexeril  6/22- pain down to 3-4/10 - will con't regimen 23. Dispo  6/8- spent a long time going over that we, as a team, think he would benefit from more time at SNF- for 1-2 months, not long term- OT offered to speak with pt about this.   6/11- d/w pt and wife who were in room- will see how he does this next week.   6/14- team conference today to discuss progress.   6/15- d/c 6/22- for now, but will see how  doing next week  6/17- set up insurance to cover until 6/22- filled out handicapped placard.  24.  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

## 2020-08-04 NOTE — Progress Notes (Signed)
Occupational Therapy Session Note  Patient Details  Name: Darren Harrison MRN: [id number] Date of Birth: 04-17-1955  Today's Date: 08/04/2020 OT Individual Time: 0700-0825 OT Individual Time Calculation (min): 85 min    Short Term Goals: Week 4:  OT Short Term Goal 1 (Week 4): STG=LTG secondary to ELOS  Skilled Therapeutic Interventions/Progress Updates:    OT intervention with initial focus on bathing at shower level and dressing at bed level and seated EOB. Transfers to rolling shower chair with Stedy. Sit<>stand with Stedy at mod A. Bathing with min A for buttocks. Pt able to cross LE to facilitate washing feet. Pt able to wash hair but required assistance for thoroughness. Pt donned pants at bed level and was able to thread pants seated in circle sitting and pull pants over hips rolling and bridging. Supine>sit EOB with supervision to don shoes. SB transfer to w/c. Transition to gym and transferred to [facility]. Pt initially engaged in heavy bag work with Hospital doctor. Focus on core strengthening/stability and BUE AROM/strengthening. Pt completed 3x8. Pt c/o increase Rt lower back pain and stretched out on mat for relief. Soft tissue massage provided. Pt transferred back to w/c and propelled to room. Pt remained seated in w/c with all needs withn reach.  Therapy Documentation Precautions:  Precautions Precautions: Cervical, Fall Precaution Comments: reviewed precautions Required Braces or Orthoses: Cervical Brace Cervical Brace: Hard collar, Other (comment) (donned in sitting) Restrictions Weight Bearing Restrictions: No Pain: Pain Assessment Pain Scale: 0-10 Pain Score: 5  Pain Type: Acute pain Pain Location: Back Pain Orientation: Lower;Right Pain Onset: On-going Pain Intervention(s): shower and stretching     Therapy/Group: Individual Therapy  [name] 08/04/2020, 8:31 AM

## 2020-08-04 NOTE — Progress Notes (Signed)
Recreational Therapy Session Note  Patient Details  Name: Darren Harrison MRN: [id number] Date of Birth: 1955/03/25 Today's Date: 08/04/2020  Pain: no c/o Skilled Therapeutic Interventions/Progress Updates: Session focused on confirming community reintegration/outing for tomorrow, reviewing purpose of the outing in detail, planning location, desired outcomes and carry over after discharge.  Also discussed transitioning home, coordinating care and support from family and friends.  Pt stated understanding and is agreement with the outing tomorrow.   Therapy/Group: Individual Therapy Katelee Schupp 08/04/2020, 3:09 PM

## 2020-08-04 NOTE — Progress Notes (Addendum)
Patient ID: JAWANN URBANI, male   DOB: [birth year], 65 y.o.   MRN: [id number]  SW faxed outpatient referral and clinicals to PCP office-Dr. Pacifico/Donna-RN (p:[phone]/f:[phone]); requesting them to schedule outpatient PT/OT at [facility]. SW waiting on follow-up.   *SW received message from Butch Penny stating their PCP office does not refer patients to [facility] as there is not enough information, and typically is done by the surgeon.   SW left message for Mya/CMA for Dr.Jenkins Office [phone] if they can make a referral to the patient's preferred outpatient clinic. SW waiting on follow-up. SW returned phone call/left message inquiring if surgical notes were provided and if surgeons office unable to address could they assist; waiting on f/u *SW spoke with Mya/Dr.Jenkins office to discuss referral. Reports she will share with surgeon and will f/u with SW once there is answer. SW provided contact information for outpatient location.  Loralee Pacas, MSW, [place] Office: [phone] Cell: [phone] Fax: [phone]

## 2020-08-05 MED ORDER — PREDNISONE 20 MG PO TABS
20.0000 mg | ORAL_TABLET | Freq: Every day | ORAL | Status: AC
  Administered 2020-08-05 – 2020-08-08 (×4): 20 mg via ORAL
  Filled 2020-08-05 (×4): qty 1

## 2020-08-05 MED ORDER — CYCLOBENZAPRINE HCL 10 MG PO TABS: 10.0000 mg | ORAL_TABLET | Freq: Three times a day (TID) | ORAL | Status: DC | PRN

## 2020-08-05 NOTE — Progress Notes (Signed)
Recreational Therapy Session Note  Patient Details  Name: Darren Harrison MRN: [id number] Date of Birth: Sep 28, 1955 Today's Date: 08/05/2020  Pain: no c/o Time:  1030-12 Pt participated in Community reintegration/outing to [employer] at overall supervision w/c level.  Goals focused on safe community mobility, identification & negotiation of obstacles, accessing public restroom, energy conservation techniques/education.  See outing goal sheet in shadow chart for full details.    Therapy/Group: [employer]   [place] 08/05/2020, 12:25 PM

## 2020-08-05 NOTE — Progress Notes (Signed)
Occupational Therapy Weekly Progress Note  Patient Details  Name: Darren Harrison MRN: [id number] Date of Birth: 1955-03-17  Beginning of progress report period: July 26, 2020 End of progress report period: August 05, 2020   Pt is making steady progress with SB tranfsers and toileting tasks. Pt d/c extended to 6/29 with focus on increased independence with DABSC transfers and toileting/clothing management. Pt will be alone in home for several hours during the day and will need to access DABSC for urinating throughout the day. Pt currently CGA for DABSC SB tranfsers and clothing management. Bathing/dressing at bed level and sitting EOB with CGA and assistance donning cervical collar and Ted hose.   Patient continues to demonstrate the following deficits: muscle weakness and muscle paralysis, unbalanced muscle activation and decreased coordination, and decreased standing balance and decreased balance strategies and therefore will continue to benefit from skilled OT intervention to enhance overall performance with BADL.  Patient progressing toward long term goals..  Continue plan of care.  OT Short Term Goals Week 4:  OT Short Term Goal 1 (Week 4): STG=LTG secondary to ELOS OT Short Term Goal 1 - Progress (Week 4): Progressing toward goal Week 5:  OT Short Term Goal 1 (Week 5): STG=LTG secondary to ELOS  [name] 08/05/2020, 6:38 AM

## 2020-08-05 NOTE — Progress Notes (Signed)
PROGRESS NOTE   Subjective/Complaints:   Urine Cx E Coli- reincubating for better growth for sensitivities.    Pt reports back pain still 6/10 this AM- really hurting every AM- Does heat, massage with Tom-OT and stretching and helps with meds-  Wondering if there's anything else that can be done.  Also worried about the referral that therapy needs- we got from surgeon.     ROS:   Pt denies SOB, abd pain, CP, N/V/C/D, and vision changes   Objective:   No results found. No results for input(s): WBC, HGB, HCT, PLT in the last 72 hours.    No results for input(s): NA, K, CL, CO2, GLUCOSE, BUN, CREATININE, CALCIUM in the last 72 hours.     Intake/Output Summary (Last 24 hours) at 08/05/2020 1111 Last data filed at 08/05/2020 0750 Gross per 24 hour  Intake 960 ml  Output 2750 ml  Net -1790 ml         Physical Exam: Vital Signs Blood pressure 102/69, pulse 75, temperature 97.6 F (36.4 C), resp. rate 18, height 5\' 10"  (1.778 m), weight 74.8 kg, SpO2 97 %.        General: awake, alert, appropriate,  sitting up in bed; but appears in pain; NAD HENT: conjugate gaze; oropharynx moist CV: regular rate; but irregular rhythm no JVD Pulmonary: CTA B/L; no W/R/R- good air movement GI: soft, NT, ND, (+)BS Psychiatric: appropriate; interactive Neurological: Alert- MAS of 1+ in LEs  Musculoskeletal:still TTP R low paraspinals- still painful/TTP Bilateral knee varus deformity ;    General: TTP over R shoulder- over biceps tendon as well as R GH joint and AC joint still TTP; crepitus with ROM     Comments: RUE- biceps 3/5, WE 4-/5, triceps 3/5, grip 3+/5, and FA 1/5- developing muscle atrophy in RUE as well as in teres and upper traps on R>L LUE- all muscles 4+/5 RLE- HF 3-/5, KE 3+/5, DF 3-/5, and PF 3-/5, LLE- HF 4+/5, KE 5-/5, DF 4/5 and PF 3-/5;  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 Still having a few spasms/extensor tone-  Sensation: RUE C4 ok- decreased from C5 to S1 on R LUE- C5 OK- decreased from C6 to S1 on L    Assessment/Plan: 1. Functional deficits which require 3+ hours per day of interdisciplinary therapy in a comprehensive inpatient rehab setting. Physiatrist is providing close team supervision and 24 hour management of active medical problems listed below. Physiatrist and rehab team continue to assess barriers to discharge/monitor patient progress toward functional and medical goals  Care Tool:  Bathing  Bathing activity did not occur: Refused Body parts bathed by patient: Right arm, Left arm, Chest, Abdomen, Front perineal area, Right upper leg, Left  upper leg, Right lower leg, Left lower leg   Body parts bathed by helper: Buttocks     Bathing assist Assist Level: Minimal Assistance - Patient > 75%     Upper Body Dressing/Undressing Upper body dressing   What is the patient wearing?: Pull over shirt, Orthosis    Upper body assist Assist Level: Minimal Assistance - Patient > 75%    Lower Body Dressing/Undressing Lower body dressing      What is the patient wearing?: Pants     Lower body assist Assist for lower body dressing: Minimal Assistance - Patient > 75%     Toileting Toileting    Toileting assist Assist for toileting: Minimal Assistance - Patient > 75%     Transfers Chair/bed transfer  Transfers assist     Chair/bed transfer assist level: Minimal Assistance - Patient > 75%     Locomotion Ambulation   Ambulation assist    Ambulation activity did not occur: Safety/medical concerns  Assist level: Moderate Assistance - Patient 50 - 74% Assistive device: Parallel bars Max distance: 6   Walk 10 feet activity   Assist  Walk 10 feet activity did not occur: Safety/medical concerns  Assist level: Dependent - Patient 0% Assistive device: Lite Gait   Walk 50 feet activity   Assist Walk 50 feet with 2 turns activity did not occur: Safety/medical concerns  Assist level: Dependent - Patient 0% Assistive device: Lite Gait    Walk 150 feet activity   Assist Walk 150 feet activity did not occur: Safety/medical concerns  Assist level: Dependent - Patient 0% Assistive device: Lite Gait    Walk 10 feet on uneven surface  activity   Assist Walk 10 feet on uneven surfaces activity did not occur: Safety/medical concerns         Wheelchair     Assist Will patient use wheelchair at discharge?: Yes Type of Wheelchair: Manual    Wheelchair assist level: Supervision/Verbal cueing Max wheelchair distance: 150+    Wheelchair 50 feet with 2 turns activity    Assist        Assist Level: Supervision/Verbal cueing   Wheelchair 150 feet activity     Assist      Assist Level: Supervision/Verbal cueing   Blood pressure 102/69, pulse 75, temperature 97.6 F (36.4 C), resp. rate 18, height 5\' 10"  (1.778 m), weight 74.8 kg, SpO2 97 %.  Medical Problem List and Plan: 1. 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  Status post C3-4 laminectomy with posterior arthrodesis cervical instrumentation 06/29/2020.  Cervical brace as directed.             -patient may  Shower with neck brace/incision covered             -ELOS/Goals: ~3 weeks- min A- -will order PRAFOs and have pt wear nightly to prevent ankle contractures  5/26- con't PT and OT- spent 20 minutes going over ASIA level, and info about SCI including neurogenic bowel and bladder, spasticity and  increased risk of DVT/pressure ulcers.   --con't PT and OT- discussed at length with pt goal is Min A- if so, will need 24/7 A at home- I don't think he'll get to Mod I (explained this)- made agreement if pt does, will reward with singing! But think this is unlikely- I would think about short term SNF-   6/23- trying to keep til next Wednesday/Thursday  since we truly feel this will get pt home by then- con't PT  and OT   6/24- con't PT and OT- working on therapeutic intervention for low back since limiting therapy. -  2.  Antithrombotics: -DVT/anticoagulation: Lovenox.  Vascular study reviewed and negative.  5/26- will need for at least 2 months- maybe 3 months total from surgery. 6/3- will need for a total of 3 months  6/22- will switch to Eliquis due to Afib and stop lovenox.  3. Pain Management: Baclofen 5 mg 3 times daily, oxycodone and Flexeril as needed  5/26- pain pretty well controlled- con't regimen  5/28-   con't regimen prn  5/31- will do R shoulder injection today.  6/3- Shoulder injection 5/31- R shoulder pain almost gone with injection   6/7- Added tramadol- got rid of 10 mg Oxy-   6/10- taking tramadol- works for R >L knee, but shoulder now acting up again- will get xray and go from there- con't regimen  6/23- taking tramadol prn- back pain more this AM- 6/10- con't regimen prn  6/24- will try Prednisone 20 mg daily x 4 days and Flexeril add 10 mg TID prn.  4. Mood: Provide emotional support  6/15- pt insisting to d/c Celexa- will discontinue.              -antipsychotic agents: N/A 5. Neuropsych: This patient is capable of making decisions on his own behalf. 6. Skin/Wound Care: Routine skin checks 7. Fluids/Electrolytes/Nutrition: Routine in and outs with follow-up chemistries 8.  Atrial fibrillation.  Lopressor 25 mg twice daily.  Cardiac rate controlled  5/28 overall controlled- con't regimen  5/31- will decrease Metoprolol to 12.5 mg daily (was qday) since heart rate on  low side and BP low- don't want him orthostatic due to SCI.   6/10- HR 60 this AM- holding metoprolol more than giving. Likely worse due to SCI bradycardia- if doesn't improve, will call Cards next week.   6/17- HR 49 this AM- due to SCI bradycardia. 6/20- Bradycardia better today- con't off B blocker.   6/22- will change to Eliquis from Lovenox -since back in Afib.   6/23- HR good this AM- con't regimen  6/24- HR well cntrolled- con't Eliquis- not on BP /rate lowering meds 9.  Hypertension with orthostatic hypotension.  Cozaar 50 mg daily.  Monitor with increased mobility  5/28 elevated today.  5/30- BP 90s/50s- will monitor for orthostatic hypotension  5/31- will decrease metoprolol as above  6/8- per pt , no dizziness, however BP low- will stop Losartan and con't metoprolol since for rate control.   6/10- BP soft again 90/s50s- off losartan- only on Metoprolol lowest dose- will call Cards next week if doesn't improve.   6/12- HR 67- con't regimen Vitals:   08/04/20 2040 08/05/20 0544  BP: 106/84 102/69  Pulse: 64 75  Resp: 18 18  Temp: 97.8 F (36.6 C) 97.6 F (36.4 C)  SpO2: 98% 97%   6/22- Heart rate in 60s this AM- will start Eliquis since back in Afib.  10.  BPH/UTI.  Was on Flomax 0.4 mg daily.   UTI greater than 80,000 Staphylococcus/Enterococcus completing course of Bactrim- please see neurogenic bladder-  11.  Surgical PCR screening positive.  Bactroban as directed 12. Hyperthyroidism- had lost 70 lbs in 6 months- in middle of treatment  6/1- per Endo, want labs checked before appointment 6/7- will order thyroid labs for tomorrow, so they are back for appointment.   6/2- TSH <0.010 and free T4 1.03- free T3 pending.   6/3- pt to have endo phone appt  6/9- asked him to let therapy know to schedule therapy around it.   6/7- has appt today per pt- will let me know if there's any changes!  6/8- allowed to stop Prednisone and will wait on Synthroid for today.   6/14- stopping  Protonix since pt doesn't need- was since previous Prednisone dose that's stopped.  13. Fatty liver-will need to be careful with any medicine going through liver. Placed nursing order to request that protein shake recommended by his hepatologist be given at night  6/7- explained to pt that tylenol can affect liver, not the other way around.  14. Neurogenic bladder- will start on I/o caths q6 hours prn and increase Flomax to 0.8 mg with supper  6/7- will change order to cath if volumes >250cc  6/10- will call Urology for advise- is usually requiring caths still- but voiding some- don't want pt to get Foley (if goes to SNF).  6/16- no cath required since Monday- will might not need to go home with caths.   6/20- no caths for 7 days- but room odor of urine?will ask nursing/therapy.  6/21- will check U/A and Cx since room smells of urine and having more incontinence which can be due to bladder spasms, which can come with UTI? 6/22- has UTI- waiting for Cx.  6/23- urine Cx being re-incubated-will maintain Bactrim and monitor- room doesn't smell of urine anymore/today 15.  Neurogenic bowel- doesn't have good control- will start with suppository- and dig stim and see if can withdraw any of them over time.   6/2- Pt having good results with bowel program- still requiring some dig stim, but less.   6/5: add miralax mixed in prune juice to dinner.   6/6- BM on toilet with dig stim/supp- improving!- con't regimen  6/21- bowel program on toilet- working well 16. Spasticity- will increase Baclofen to 10 mg TID and con't Flexeril.   D/c'd flexeril  5/31- will d/w pt going up on baclofen cannot do zanaflex due to low BP and cannot do dantrolene due to liver issues.  6/14- spasms getting a little worse- wait to increase Baclofen since don't have much more to go up without side effects.   17. Orthostatic hypotension  5/26- added TEDs and Abd binder during day.  5/31- decreased metoprolol   6/8- will stop  Losartan 18. Ileus     Resolved tolerates Regular diet 19. R shoulder pain  5/30- will try to do R shoulder injection tomorrow 5/31- R shoulder injections for subacromial and RTC/posterior shoulder injection today  6/2-6/5 pain MUCH better- continue regimen 5/31-received steroid injection.   6/10- will get shoulder xray as above- and if need be, MRI of R shoulder since pain has come back.   6/11- xray shows likely RTC injury and significant DJD of R shoulder- GH and AC- will start Voltaren gel QID.  20. Nausea: resolved: changed valium and reglan to PRN 21. R>L knee pain  6/7- added tramadol 50 mg q6 hours prn and stopped Oxycodone 10 mg- take oxy only if severe pain; try Tramadol, not tlyenol when hurting BEFORE therapy.  6/10- doing well with tramadol c-on't regimen 22. Back pain  6/16- has trigger point - will try therapy- if not, will try trigger point injection  6/20- will see if needs to injections- 50% better- will see tomorrow 6/18 some relief with flexeril  6/22- pain down to 3-4/10 - will con't regimen 23. Dispo  6/8- spent a long time going over that we, as a team,  think he would benefit from more time at SNF- for 1-2 months, not long term- OT offered to speak with pt about this.   6/11- d/w pt and wife who were in room- will see how he does this next week.   6/14- team conference today to discuss progress.   6/15- d/c 6/22- for now, but will see how doing next week  6/17- set up insurance to cover until 6/22- filled out handicapped placard.   6/24- d/c date set for 6/29- firm d/c date.  24.  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

## 2020-08-05 NOTE — Progress Notes (Signed)
Physical Therapy Session Note  Patient Details  Name: Darren Harrison MRN: [id number] Date of Birth: Oct 11, 1955  Today's Date: 08/05/2020 PT Individual Time: 2241-1464 PT Individual Time Calculation (min): 38 min   Short Term Goals: Week 1:  PT Short Term Goal 1 (Week 1): Pt will complete bed mobility with assist x 1 consistently PT Short Term Goal 1 - Progress (Week 1): Met PT Short Term Goal 2 (Week 1): Pt will initiate slide board transfers PT Short Term Goal 2 - Progress (Week 1): Met PT Short Term Goal 3 (Week 1): Pt will maintain sitting balance x 5 min with min A PT Short Term Goal 3 - Progress (Week 1): Progressing toward goal Week 2:  PT Short Term Goal 1 (Week 2): Pt will perform bed mobility with mod A consistently PT Short Term Goal 1 - Progress (Week 2): Met PT Short Term Goal 2 (Week 2): Pt will perform least restrictive transfer with assist x 1 PT Short Term Goal 2 - Progress (Week 2): Met PT Short Term Goal 3 (Week 2): Pt will maintain standing with LRAD x 5 min PT Short Term Goal 3 - Progress (Week 2): Met PT Short Term Goal 4 (Week 2): Pt will maintain sitting balance x 5 min with min A PT Short Term Goal 4 - Progress (Week 2): Met Week 3:  PT Short Term Goal 1 (Week 3): Pt will perform bed mobility with min A consistently PT Short Term Goal 1 - Progress (Week 3): Progressing toward goal PT Short Term Goal 2 (Week 3): Pt will perform least restrictive transfer with min A consistently PT Short Term Goal 2 - Progress (Week 3): Met PT Short Term Goal 3 (Week 3): Pt will initiate w/c mobility in ultra lightweight wheelchair PT Short Term Goal 3 - Progress (Week 3): Met PT Short Term Goal 4 (Week 3): Pt will maintain sitting balance x 5 min with Supervision PT Short Term Goal 4 - Progress (Week 3): Met Week 4:  PT Short Term Goal 1 (Week 4): =LTG due to ELOS PT Short Term Goal 1 - Progress (Week 4): Progressing toward goal Week 5:  PT Short Term Goal 1 (Week 5): =LTG due  to ELOS  Skilled Therapeutic Interventions/Progress Updates:    Pt seated in w/c upon arrival and agreeable to therapy with cervical collar in place. Pt reports 5/10 pain in low back. Pain addressed with manual therapy and pain medication administered by nursing during session. Pt propelled w/c with BUE to therapy gym, x 180 ft with supervision. Pt performed slideboard transfer w/c<>mat table with CGA. Pt directed in tricep push ups in on push up bars with cues to load through feet and breathe throughout exercise. PT performed manual therapy to R sided low back with pt in sitting, including triggerpoint release and myofascial release. Pt returned to room after session, reporting decrease in pain to 2/10. Pt left seated in w/c with all needs in reach.   Therapy Documentation Precautions:  Precautions Precautions: Cervical, Fall Precaution Comments: reviewed precautions Required Braces or Orthoses: Cervical Brace Cervical Brace: Hard collar, Other (comment) (donned in sitting) Restrictions Weight Bearing Restrictions: No   Therapy/Group: Individual Therapy  Mickel Fuchs 08/05/2020, 12:37 PM

## 2020-08-05 NOTE — Progress Notes (Signed)
Occupational Therapy Session Note  Patient Details  Name: ARMOND CUTHRELL MRN: [id number] Date of Birth: Jul 14, 1955  Today's Date: 08/05/2020 OT Individual Time: 1030-1200 OT Individual Time Calculation (min): 90 min    Short Term Goals: Week 5:  OT Short Term Goal 1 (Week 5): STG=LTG secondary to ELOS  Skilled Therapeutic Interventions/Progress Updates:    OT community integration cotx with Recreational Therapist. Pt participated in outing to local [employer] with focus on w/c mobility in community setting with emphasis on community accessibility and access. Pt required min A for navigating small ramps/inclines but was able to navigate all store sections including restrooms. Pt engaged in problem solving use of toilet (if needed) and had discussion about how each facility is different and what works in one situation might not work in another situation. Discussed energy conservations strategies. Pt able to safely reach all shelving except top one. Pt pleased with outing and commented that it was an "eye opener." Pt returned to room and completed SB transfer to bed with supervision. Pt remained in bed with all needs within reach and bed alarm activated.   Therapy Documentation Precautions:  Precautions Precautions: Cervical, Fall Precaution Comments: reviewed precautions Required Braces or Orthoses: Cervical Brace Cervical Brace: Hard collar, Other (comment) (donned in sitting) Restrictions Weight Bearing Restrictions: No   Pain:  Pt reports his back is feeling much better   Therapy/Group: Individual Therapy  [name] 08/05/2020, 12:09 PM

## 2020-08-05 NOTE — Progress Notes (Signed)
Occupational Therapy Session Note  Patient Details  Name: Darren Harrison MRN: [id number] Date of Birth: [birth year]  Today's Date: 08/05/2020 OT Individual Time: 0700-0800 OT Individual Time Calculation (min): 60 min    Short Term Goals: Week 5:  OT Short Term Goal 1 (Week 5): STG=LTG secondary to ELOS  Skilled Therapeutic Interventions/Progress Updates:    Pt resting in bed upon arrival and ready for the day. During supine>sit pt experienced lower back pain (recurring). Initial focus on stretching and soft tissue massage followed by heat with some relief noted. Pt donned pants at bed level with min A. Supine>sit EOB with supervision. Pt donned pullover shirt and shoes with supervision. Pt required assistance for Ted hose and cervical collar. SB transfer to w/c with supervision. Heat applied to lower back after sitting in w/c. Pt remained seated in w/c with all needs within reach.   Therapy Documentation Precautions:  Precautions Precautions: Cervical, Fall Precaution Comments: reviewed precautions Required Braces or Orthoses: Cervical Brace Cervical Brace: Hard collar, Other (comment) (donned in sitting) Restrictions Weight Bearing Restrictions: No Pain:  Pt c/o 8/10 pain in Lt lower back; stretching, soft tissue massage, heat with some relief   Therapy/Group: Individual Therapy  [name] 08/05/2020, 8:59 AM

## 2020-08-06 LAB — URINE CULTURE: Culture: >=100000 — AB

## 2020-08-06 MED ORDER — LIDOCAINE 5 % EX PTCH
1.0000 | MEDICATED_PATCH | CUTANEOUS | Status: DC
  Administered 2020-08-07 – 2020-08-10 (×4): 1 via TRANSDERMAL
  Filled 2020-08-06 (×6): qty 1

## 2020-08-06 NOTE — Progress Notes (Signed)
Physical Therapy Session Note  Patient Details  Name: Darren Harrison MRN: [id number] Date of Birth: 08-24-1955  Today's Date: 08/06/2020 PT Individual Time: 0902-1018 PT Individual Time Calculation (min): 76 min   Short Term Goals: Week 5:  PT Short Term Goal 1 (Week 5): =LTG due to ELOS  Skilled Therapeutic Interventions/Progress Updates:  Patient seated at EOB on entrance to room. Wife present. Patient alert and agreeable to PT session but c/o lower R back pain. Pt attempting self stretching with trunk rotations at EOB but states no change in pain level yet.   Therapeutic Activity: Transfers: Patient performed SB transfer from bed <> new w/c  and w/c <> mat table throughout session with supervision. SB shifts intermittently with catch on shorts but pt able to adjust and reposition. One instance of hold of SB. No vc required.   Wheelchair Mobility:  Patient propelled wheelchair >150 feet x2 with Mod I over level surfaces. Is able to position w/c at [facility] and EOB appropriately in setup for transfers. Provided vc throughout as needed to assist in locating and problem solving use of several items on chair including removable brake handle extenders, leg rests and locks, arm rests and locks.  Manual Therapy: In sidelying, hot pack applied to area of right SI joint as pt's indicated area of pain. Manual therapy techniques including STM, CFM, and TPR applied to areas superior, inferior and lateral to right S.I. joint where pt indicates pain. Significant decrease in pt's pain level following treatment.   Therapeutic Exercise: Following manual therapy, hot pack applied along R side of back and glutes during passive stretch of piriformis. Demonstrated self piriformis stretch in supine. Pt progressed into self stretching including DKTC, SKTC, double knee lumbar rotation.   Patient performs transition from seated position on EOB to supine in bed with Min A for BLE in order to reduce strain on lower  back. Pt sidelying in bed at end of session with brakes locked, bed alarm set, and all needs within reach.     Therapy Documentation Precautions:  Precautions Precautions: Cervical, Fall Precaution Comments: reviewed precautions Required Braces or Orthoses: Cervical Brace Cervical Brace: Hard collar, Other (comment) (donned in sitting) Restrictions Weight Bearing Restrictions: No  Therapy/Group: Individual Therapy  Alger Simons PT, DPT 08/06/2020, 4:00 PM

## 2020-08-06 NOTE — Progress Notes (Signed)
PROGRESS NOTE   Subjective/Complaints: Asks for lidocaine patch to be given during day instead of night- switched Asks why injections switched to oral anticoagulation: discussed due to afib. Says he was not treated with oral anticoagulation in past due to his cirrhosis    ROS:   Pt denies SOB, abd pain, CP, N/V/C/D, and vision changes, +low back pain   Objective:   No results found. No results for input(s): WBC, HGB, HCT, PLT in the last 72 hours.    No results for input(s): NA, K, CL, CO2, GLUCOSE, BUN, CREATININE, CALCIUM in the last 72 hours.     Intake/Output Summary (Last 24 hours) at 08/06/2020 1314 Last data filed at 08/06/2020 1304 Gross per 24 hour  Intake 956 ml  Output 2182 ml  Net -1226 ml         Physical Exam: Vital Signs Blood pressure 100/67, pulse 60, temperature 97.9 F (36.6 C), resp. rate 18, height 5\' 10"  (1.778 m), weight 74.8 kg, SpO2 97 %. General: awake, alert, appropriate,  sitting up in bed; pleasant- currently not in pain HENT: conjugate gaze; oropharynx moist CV: regular rate; but irregular rhythm no JVD Pulmonary: CTA B/L; no W/R/R- good air movement GI: soft, NT, ND, (+)BS Psychiatric: appropriate; interactive Neurological: Alert- MAS of 1+ in LEs  Musculoskeletal:still TTP R low paraspinals- still painful/TTP Bilateral knee varus deformity ;    General: TTP over R shoulder- over biceps tendon as well as R GH joint and AC joint still TTP; crepitus with ROM     Comments: RUE- biceps 3/5, WE 4-/5, triceps 3/5, grip 3+/5, and FA 1/5- developing muscle atrophy in RUE as well as in teres and upper traps on R>L LUE- all muscles 4+/5 RLE- HF 3-/5, KE 3+/5, DF 3-/5, and PF 3-/5, LLE- HF 4+/5, KE 5-/5, DF 4/5 and PF 3-/5;  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 Still having a few spasms/extensor tone-  Sensation: RUE C4 ok- decreased from C5 to S1 on R LUE- C5 OK- decreased from C6 to S1 on L    Assessment/Plan: 1. Functional deficits which require 3+ hours per day of interdisciplinary therapy in a comprehensive inpatient rehab setting. Physiatrist is providing close team supervision and 24 hour management of active medical problems listed below. Physiatrist and rehab team continue to assess barriers to discharge/monitor patient progress toward functional and medical goals  Care Tool:  Bathing  Bathing activity did not occur: Refused Body parts bathed by patient: Right arm, Left arm, Chest, Abdomen, Front perineal area, Right upper leg, Left upper leg, Right lower leg, Left lower leg   Body parts bathed by helper: Buttocks     Bathing assist Assist Level: Minimal Assistance - Patient >  75%     Upper Body Dressing/Undressing Upper body dressing   What is the patient wearing?: Pull over shirt, Orthosis    Upper body assist Assist Level: Minimal Assistance - Patient > 75%    Lower Body Dressing/Undressing Lower body dressing      What is the patient wearing?: Pants     Lower body assist Assist for lower body dressing: Minimal Assistance - Patient > 75%     Toileting Toileting    Toileting assist Assist for toileting: Minimal Assistance - Patient > 75%     Transfers Chair/bed transfer  Transfers assist     Chair/bed transfer assist level: Minimal Assistance - Patient > 75%     Locomotion Ambulation   Ambulation assist   Ambulation activity did not occur: Safety/medical concerns  Assist level: Moderate Assistance - Patient 50  - 74% Assistive device: Parallel bars Max distance: 6   Walk 10 feet activity   Assist  Walk 10 feet activity did not occur: Safety/medical concerns  Assist level: Dependent - Patient 0% Assistive device: Lite Gait   Walk 50 feet activity   Assist Walk 50 feet with 2 turns activity did not occur: Safety/medical concerns  Assist level: Dependent - Patient 0% Assistive device: Lite Gait    Walk 150 feet activity   Assist Walk 150 feet activity did not occur: Safety/medical concerns  Assist level: Dependent - Patient 0% Assistive device: Lite Gait    Walk 10 feet on uneven surface  activity   Assist Walk 10 feet on uneven surfaces activity did not occur: Safety/medical concerns         Wheelchair     Assist Will patient use wheelchair at discharge?: Yes Type of Wheelchair: Manual    Wheelchair assist level: Supervision/Verbal cueing Max wheelchair distance: 150+    Wheelchair 50 feet with 2 turns activity    Assist        Assist Level: Supervision/Verbal cueing   Wheelchair 150 feet activity     Assist      Assist Level: Supervision/Verbal cueing   Blood pressure 100/67, pulse 60, temperature 97.9 F (36.6 C), resp. rate 18, height 5\' 10"  (1.778 m), weight 74.8 kg, SpO2 97 %.  Medical Problem List and Plan: 1. 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  Status post C3-4 laminectomy with posterior arthrodesis cervical instrumentation 06/29/2020.  Cervical brace as directed.             -patient may  Shower with neck brace/incision covered             -ELOS/Goals: ~3 weeks- min A- -will order PRAFOs and have pt wear nightly to prevent ankle contractures  5/26- con't PT and OT- spent 20 minutes going over ASIA level, and info about SCI including neurogenic bowel and bladder, spasticity and increased risk of DVT/pressure ulcers.   --con't PT and OT- discussed at length with pt goal is Min A- if so, will  need 24/7 A at home- I don't think he'll get to Mod I (explained this)- made agreement if pt does, will reward with singing! But think this is unlikely- I would think about short term SNF-   6/23- trying to keep til next Wednesday/Thursday  since we truly feel this will get pt home by then- con't PT and OT   Continue PT and OT- working on therapeutic intervention for low back since limiting therapy. -  2.  Antithrombotics: -DVT/anticoagulation: Lovenox.  Vascular study reviewed  and negative.  5/26- will need for at least 2 months- maybe 3 months total from surgery. 6/3- will need for a total of 3 months  6/22- will switch to Eliquis due to Afib and stop lovenox. Patient says he was not started on oral AC for afib due to his cirrhosis.  3. Lower back spasms/R shoulder pain: Continue Baclofen 5 mg 3 times daily, oxycodone and Flexeril as needed  5/26- pain pretty well controlled- con't regimen  5/28-   con't regimen prn  5/31- will do R shoulder injection today.  6/3- Shoulder injection 5/31- R shoulder pain almost gone with injection   6/7- Added tramadol- got rid of 10 mg Oxy-   6/10- taking tramadol- works for R >L knee, but shoulder now acting up again- will get xray and go from there- con't regimen  6/23- taking tramadol prn- back pain more this AM- 6/10- con't regimen prn  6/24- will try Prednisone 20 mg daily x 4 days and Flexeril add 10 mg TID prn.  4. Mood: Provide emotional support  6/15- pt insisting to d/c Celexa- will discontinue.              -antipsychotic agents: N/A 5. Neuropsych: This patient is capable of making decisions on his own behalf. 6. Skin/Wound Care: Routine skin checks 7. Fluids/Electrolytes/Nutrition: Routine in and outs with follow-up chemistries 8.  Atrial fibrillation.  Lopressor 25 mg twice daily.  Cardiac rate controlled  5/28 overall controlled- con't regimen  5/31- will decrease Metoprolol to 12.5 mg daily (was qday) since heart rate on low side and BP  low- don't want him orthostatic due to SCI.   6/10- HR 60 this AM- holding metoprolol more than giving. Likely worse due to SCI bradycardia- if doesn't improve, will call Cards next week.   6/17- HR 49 this AM- due to SCI bradycardia. 6/20- Bradycardia better today- con't off B blocker.   6/22- will change to Eliquis from Lovenox -since back in Afib.   6/23- HR good this AM- con't regimen  6/24- HR well cntrolled- con't Eliquis- not on BP /rate lowering meds 9.  Hypertension with orthostatic hypotension.  Cozaar 50 mg daily.  Monitor with increased mobility  5/28 elevated today.  5/30- BP 90s/50s- will monitor for orthostatic hypotension  5/31- will decrease metoprolol as above  6/8- per pt , no dizziness, however BP low- will stop Losartan and con't metoprolol since for rate control.   6/10- BP soft again 90/s50s- off losartan- only on Metoprolol lowest dose- will call Cards next week if doesn't improve.   6/12- HR 67- con't regimen Vitals:   08/05/20 2002 08/06/20 0505  BP: 116/72 100/67  Pulse: 87 60  Resp: 18 18  Temp: 98.4 F (36.9 C) 97.9 F (36.6 C)  SpO2: 98% 97%   6/22- Heart rate in 60s this AM- will start Eliquis since back in Afib.  10.  BPH/UTI.  Was on Flomax 0.4 mg daily.   UTI greater than 80,000 Staphylococcus/Enterococcus completing course of Bactrim- please see neurogenic bladder-  11.  Surgical PCR screening positive.  Bactroban as directed 12. Hyperthyroidism- had lost 70 lbs in 6 months- in middle of treatment  6/1- per Endo, want labs checked before appointment 6/7- will order thyroid labs for tomorrow, so they are back for appointment.   6/2- TSH <0.010 and free T4 1.03- free T3 pending.   6/3- pt to have endo phone appt 6/9- asked him to let therapy know to schedule therapy around  it.   6/7- has appt today per pt- will let me know if there's any changes!  6/8- allowed to stop Prednisone and will wait on Synthroid for today.   6/14- stopping Protonix since pt  doesn't need- was since previous Prednisone dose that's stopped.  13. Fatty liver-will need to be careful with any medicine going through liver. Placed nursing order to request that protein shake recommended by his hepatologist be given at night  6/7- explained to pt that tylenol can affect liver, not the other way around.  14. Neurogenic bladder- will start on I/o caths q6 hours prn and increase Flomax to 0.8 mg with supper  6/7- will change order to cath if volumes >250cc  6/10- will call Urology for advise- is usually requiring caths still- but voiding some- don't want pt to get Foley (if goes to SNF).  6/16- no cath required since Monday- will might not need to go home with caths.   6/20- no caths for 7 days- but room odor of urine?will ask nursing/therapy.  6/21- will check U/A and Cx since room smells of urine and having more incontinence which can be due to bladder spasms, which can come with UTI? 6/22- has UTI- waiting for Cx.  6/23- urine Cx being re-incubated-will maintain Bactrim and monitor- room doesn't smell of urine anymore/today 15.  Neurogenic bowel- doesn't have good control- will start with suppository- and dig stim and see if can withdraw any of them over time.   6/2- Pt having good results with bowel program- still requiring some dig stim, but less.   6/5: add miralax mixed in prune juice to dinner.   6/6- BM on toilet with dig stim/supp- improving!- con't regimen  6/25- continue bowel program on toilet- working well 16. Spasticity- will increase Baclofen to 10 mg TID and con't Flexeril.   Continue flexeril HS  5/31- will d/w pt going up on baclofen cannot do zanaflex due to low BP and cannot do dantrolene due to liver issues.  6/14- spasms getting a little worse- wait to increase Baclofen since don't have much more to go up without side effects.   17. Orthostatic hypotension  5/26- added TEDs and Abd binder during day.  5/31- decreased metoprolol   6/8- will stop  Losartan 18. Ileus     Resolved tolerates Regular diet 19. R shoulder pain  5/30- will try to do R shoulder injection tomorrow 5/31- R shoulder injections for subacromial and RTC/posterior shoulder injection today  6/2-6/5 pain MUCH better- continue regimen 5/31-received steroid injection.   6/10- will get shoulder xray as above- and if need be, MRI of R shoulder since pain has come back.   6/11- xray shows likely RTC injury and significant DJD of R shoulder- GH and AC- will start Voltaren gel QID.  20. Nausea: resolved: changed valium and reglan to PRN 21. R>L knee pain  6/7- added tramadol 50 mg q6 hours prn and stopped Oxycodone 10 mg- take oxy only if severe pain; try Tramadol, not tlyenol when hurting BEFORE therapy.  6/10- doing well with tramadol c-on't regimen 22. Back pain  6/16- has trigger point - will try therapy- if not, will try trigger point injection  6/20- will see if needs to injections- 50% better- will see tomorrow 6/18 some relief with flexeril  6/22- pain down to 3-4/10 - will con't regimen 23. Dispo  6/8- spent a long time going over that we, as a team, think he would benefit from more time at SNF-  for 1-2 months, not long term- OT offered to speak with pt about this.   6/11- d/w pt and wife who were in room- will see how he does this next week.   6/14- team conference today to discuss progress.   6/15- d/c 6/22- for now, but will see how doing next week  6/17- set up insurance to cover until 6/22- filled out handicapped placard.   6/24- d/c date set for 6/29- firm d/c date.  24.  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

## 2020-08-06 NOTE — Progress Notes (Signed)
Continued bowel program from previous shift. Medium stool removed and recorded. Pt. Tolerated well.

## 2020-08-06 NOTE — Progress Notes (Signed)
Bowel program done. No results as of this time.

## 2020-08-06 NOTE — Progress Notes (Signed)
Occupational Therapy Session Note  Patient Details  Name: Darren Harrison MRN: [id number] Date of Birth: [birth year]  Today's Date: 08/06/2020 OT Individual Time: 7530-0511 OT Individual Time Calculation (min): 30 min    Short Term Goals: Week 1:  OT Short Term Goal 1 (Week 1): Pt will perform self feeding with Min A with AE PRN OT Short Term Goal 2 (Week 1): Pt will improve trunk control/sitting balance to sit EOB/EOM for 5 mins with no more than Min A OT Short Term Goal 3 (Week 1): Pt will complete sit <> stands in [place] with Max of 1  Skilled Therapeutic Interventions/Progress Updates:    Pt greeted in the bed, agreeable to tx and eager to participate. CGA for slideobard<w/c, pt going slightly uphill due to cushion. Setup to don wheelchair gloves and pt able to manage his breaks/assist with setup of DME. He then self propelled to the dayroom to work on UB strengthening/endurance. To work on LE strengthening, set pt up in the [place], sustained activity for 5 min at 45 cm/sec and then at 50 cm/sec. Pt escorted to midwest unit and then self propelled back to the room at end of session.   Therapy Documentation Precautions:  Precautions Precautions: Cervical, Fall Precaution Comments: reviewed precautions Required Braces or Orthoses: Cervical Brace Cervical Brace: Hard collar, Other (comment) (donned in sitting) Restrictions Weight Bearing Restrictions: No  Pain: in lower back, pt stated he will be receiving a pain patch later this evening to address, manageable during tx   ADL: ADL Eating: Not assessed Grooming: Not assessed Upper Body Bathing: Maximal assistance Lower Body Bathing: Dependent Upper Body Dressing: Maximal assistance Lower Body Dressing: Dependent Toileting: Dependent Toilet Transfer:  (stedy +2)     Therapy/Group: Individual Therapy  [name] A [name] 08/06/2020, 4:03 PM

## 2020-08-07 NOTE — Progress Notes (Signed)
Bladder program done. No bowel movement at this time.

## 2020-08-07 NOTE — Plan of Care (Signed)
  Problem: SCI BOWEL ELIMINATION Goal: RH STG MANAGE BOWEL WITH ASSISTANCE Description: STG Manage Bowel with  min Assistance. Outcome: Not Progressing; bowel program   Problem: SCI BLADDER ELIMINATION Goal: RH STG MANAGE BLADDER WITH ASSISTANCE Description: STG Manage Bladder With  min Assistance Outcome: Not Progressing; bladder scan q 6

## 2020-08-07 NOTE — Progress Notes (Signed)
PROGRESS NOTE   Subjective/Complaints: Denies pain Asks for how long he will need cervical brace   ROS:   Pt denies SOB, abd pain, CP, N/V/C/D, and vision changes, +low back pain   Objective:   No results found. No results for input(s): WBC, HGB, HCT, PLT in the last 72 hours.    No results for input(s): NA, K, CL, CO2, GLUCOSE, BUN, CREATININE, CALCIUM in the last 72 hours.     Intake/Output Summary (Last 24 hours) at 08/07/2020 0947 Last data filed at 08/06/2020 2100 Gross per 24 hour  Intake 472 ml  Output 1796 ml  Net -1324 ml         Physical Exam: Vital Signs Blood pressure 116/77, pulse (!) 59, temperature 98.1 F (36.7 C), temperature source Oral, resp. rate 16, height 5\' 10"  (1.778 m), weight 74.8 kg, SpO2 99 %. Gen: no distress, normal appearing HEENT: oral mucosa pink and moist, NCAT Cardio: Bradycardic Chest: normal effort, normal rate of breathing Abd: soft, non-distended Ext: no edema Psych: pleasant, normal affect Skin: intact Neurological: Alert- MAS of 1+ in LEs  Musculoskeletal:still TTP R low paraspinals- still painful/TTP Bilateral knee varus deformity ;    General: TTP over R shoulder- over biceps tendon as well as R GH joint and AC joint still TTP; crepitus with ROM     Comments: RUE- biceps 3/5, WE 4-/5, triceps 3/5, grip 3+/5, and FA 1/5- developing muscle atrophy in RUE as well as in teres and upper traps on R>L LUE- all muscles 4+/5 RLE- HF 3-/5, KE 3+/5, DF 3-/5, and PF 3-/5, LLE- HF 4+/5, KE 5-/5, DF 4/5 and PF 3-/5;  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 Still having a few spasms/extensor tone-  Sensation: RUE C4 ok- decreased from C5 to S1 on R LUE- C5 OK- decreased from C6 to S1 on L    Assessment/Plan: 1. Functional deficits which require 3+ hours per day of interdisciplinary therapy in a comprehensive inpatient rehab setting. Physiatrist is providing close team supervision and 24 hour management of active medical problems listed below. Physiatrist and rehab team continue to assess barriers to discharge/monitor patient progress toward functional and medical goals  Care Tool:  Bathing  Bathing activity did not occur: Refused Body parts bathed by patient: Right arm, Left arm, Chest, Abdomen, Front perineal area, Right upper leg, Left upper leg, Right lower leg, Left lower leg   Body parts bathed by helper: Buttocks     Bathing assist Assist Level: Minimal Assistance - Patient > 75%     Upper Body Dressing/Undressing Upper body dressing   What is the patient wearing?: Pull over shirt, Orthosis    Upper body assist Assist Level: Minimal Assistance -  Patient > 75%    Lower Body Dressing/Undressing Lower body dressing      What is the patient wearing?: Pants     Lower body assist Assist for lower body dressing: Minimal Assistance - Patient > 75%     Toileting Toileting    Toileting assist Assist for toileting: Minimal Assistance - Patient > 75%     Transfers Chair/bed transfer  Transfers assist     Chair/bed transfer assist level: Minimal Assistance - Patient > 75%     Locomotion Ambulation   Ambulation assist   Ambulation activity did not occur: Safety/medical concerns  Assist level: Moderate Assistance - Patient 50 - 74% Assistive device: Parallel bars Max distance: 6   Walk 10 feet activity   Assist  Walk 10 feet activity did not occur: Safety/medical concerns  Assist level: Dependent - Patient  0% Assistive device: Lite Gait   Walk 50 feet activity   Assist Walk 50 feet with 2 turns activity did not occur: Safety/medical concerns  Assist level: Dependent - Patient 0% Assistive device: Lite Gait    Walk 150 feet activity   Assist Walk 150 feet activity did not occur: Safety/medical concerns  Assist level: Dependent - Patient 0% Assistive device: Lite Gait    Walk 10 feet on uneven surface  activity   Assist Walk 10 feet on uneven surfaces activity did not occur: Safety/medical concerns         Wheelchair     Assist Will patient use wheelchair at discharge?: Yes Type of Wheelchair: Manual    Wheelchair assist level: Supervision/Verbal cueing Max wheelchair distance: 150+    Wheelchair 50 feet with 2 turns activity    Assist        Assist Level: Supervision/Verbal cueing   Wheelchair 150 feet activity     Assist      Assist Level: Supervision/Verbal cueing   Blood pressure 116/77, pulse (!) 59, temperature 98.1 F (36.7 C), temperature source Oral, resp. rate 16, height 5\' 10"  (1.778 m), weight 74.8 kg, SpO2 99 %.  Medical Problem List and Plan: 1. 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  Status post C3-4 laminectomy with posterior arthrodesis cervical instrumentation 06/29/2020.  Cervical brace as directed.             -patient may  Shower with neck brace/incision covered             -ELOS/Goals: ~3 weeks- min A- -will order PRAFOs and have pt wear nightly to prevent ankle contractures  5/26- con't PT and OT- spent 20 minutes going over ASIA level, and info about SCI including neurogenic bowel and bladder, spasticity and increased risk of DVT/pressure ulcers.   --con't PT and OT- discussed at length with pt goal is Min A- if so, will need 24/7 A at home- I don't think he'll get to Mod I (explained this)- made agreement if pt does, will reward with singing! But think this is unlikely- I would  think about short term SNF-   6/23- trying to keep til next Wednesday/Thursday  since we truly feel this will get pt home by then- con't PT and OT   Continue PT and OT- working on therapeutic intervention for low back since limiting therapy. Continue prednisone.  2.  Impaired mobility -DVT/anticoagulation:  Vascular study reviewed and negative.  5/26- will need for at least 2 months- maybe 3 months total from surgery. 6/3- will need for a total of 3 months  6/22-  will switch to Eliquis due to Afib and stop lovenox. Patient says he was not started on oral AC for afib due to his cirrhosis.  3. Lower back spasms/R shoulder pain: Continue Baclofen 5 mg 3 times daily, oxycodone and Flexeril as needed  5/26- pain pretty well controlled- con't regimen  5/28-   con't regimen prn  5/31- will do R shoulder injection today.  6/3- Shoulder injection 5/31- R shoulder pain almost gone with injection   6/7- Added tramadol- got rid of 10 mg Oxy-   6/10- taking tramadol- works for R >L knee, but shoulder now acting up again- will get xray and go from there- con't regimen  6/23- taking tramadol prn- back pain more this AM- 6/10- con't regimen prn  6/24- will try Prednisone 20 mg daily x 4 days and Flexeril add 10 mg TID prn.  4. Mood: Provide emotional support  6/15- pt insisting to d/c Celexa- will discontinue.              -antipsychotic agents: N/A 5. Neuropsych: This patient is capable of making decisions on his own behalf. 6. Skin/Wound Care: Routine skin checks 7. Fluids/Electrolytes/Nutrition: Routine in and outs with follow-up chemistries 8.  Atrial fibrillation.  Lopressor 25 mg twice daily.  Cardiac rate controlled  5/28 overall controlled- con't regimen  5/31- will decrease Metoprolol to 12.5 mg daily (was qday) since heart rate on low side and BP low- don't want him orthostatic due to SCI.   6/10- HR 60 this AM- holding metoprolol more than giving. Likely worse due to SCI bradycardia- if doesn't  improve, will call Cards next week.   6/17- HR 49 this AM- due to SCI bradycardia. 6/20- Bradycardia better today- con't off B blocker.   6/22- will change to Eliquis from Lovenox -since back in Afib.   6/23- HR good this AM- con't regimen  6/24- HR well cntrolled- con't Eliquis- not on BP /rate lowering meds 9.  Hypertension with orthostatic hypotension.  Cozaar 50 mg daily.  Monitor with increased mobility  5/28 elevated today.  5/30- BP 90s/50s- will monitor for orthostatic hypotension  5/31- will decrease metoprolol as above  6/8- per pt , no dizziness, however BP low- will stop Losartan and con't metoprolol since for rate control.   6/10- BP soft again 90/s50s- off losartan- only on Metoprolol lowest dose- will call Cards next week if doesn't improve.   6/12- HR 67- con't regimen Vitals:   08/06/20 1918 08/07/20 0516  BP: 94/83 116/77  Pulse: 73 (!) 59  Resp: 17 16  Temp: 98.1 F (36.7 C) 98.1 F (36.7 C)  SpO2: 96% 99%   6/22- Heart rate in 60s this AM- will start Eliquis since back in Afib.  10.  BPH/UTI.  Was on Flomax 0.4 mg daily.   UTI greater than 80,000 Staphylococcus/Enterococcus completing course of Bactrim- please see neurogenic bladder-  11.  Surgical PCR screening positive.  Bactroban as directed 12. Hyperthyroidism- had lost 70 lbs in 6 months- in middle of treatment  6/1- per Endo, want labs checked before appointment 6/7- will order thyroid labs for tomorrow, so they are back for appointment.   6/2- TSH <0.010 and free T4 1.03- free T3 pending.   6/3- pt to have endo phone appt 6/9- asked him to let therapy know to schedule therapy around it.   6/7- has appt today per pt- will let me know if there's any changes!  6/8- allowed to stop Prednisone and will wait on  Synthroid for today.   6/14- stopping Protonix since pt doesn't need- was since previous Prednisone dose that's stopped.  13. Fatty liver-will need to be careful with any medicine going through liver.  Placed nursing order to request that protein shake recommended by his hepatologist be given at night  6/7- explained to pt that tylenol can affect liver, not the other way around.  14. Neurogenic bladder- will start on I/o caths q6 hours prn and increase Flomax to 0.8 mg with supper  6/7- will change order to cath if volumes >250cc  6/10- will call Urology for advise- is usually requiring caths still- but voiding some- don't want pt to get Foley (if goes to SNF).  6/16- no cath required since Monday- will might not need to go home with caths.   6/20- no caths for 7 days- but room odor of urine?will ask nursing/therapy.  6/21- will check U/A and Cx since room smells of urine and having more incontinence which can be due to bladder spasms, which can come with UTI? 6/22- has UTI- waiting for Cx.  6/23-6/26 urine Cx being re-incubated-will continue Bactrim and monitor- room doesn't smell of urine anymore/today 15.  Neurogenic bowel- doesn't have good control- will start with suppository- and dig stim and see if can withdraw any of them over time.   6/2- Pt having good results with bowel program- still requiring some dig stim, but less.   6/5: add miralax mixed in prune juice to dinner.   6/6- BM on toilet with dig stim/supp- improving!- con't regimen  6/25-6/26: continue bowel program on toilet- working well 16. Spasticity- will increase Baclofen to 10 mg TID and con't Flexeril.   Continue flexeril HS  5/31- will d/w pt going up on baclofen cannot do zanaflex due to low BP and cannot do dantrolene due to liver issues.  6/14- spasms getting a little worse- wait to increase Baclofen since don't have much more to go up without side effects.   17. Orthostatic hypotension  5/26- added TEDs and Abd binder during day.  5/31- decreased metoprolol   6/8- will stop Losartan 18. Ileus     Resolved tolerates Regular diet 19. R shoulder pain  5/30- will try to do R shoulder injection tomorrow 5/31- R  shoulder injections for subacromial and RTC/posterior shoulder injection today  6/2-6/5 pain MUCH better- continue regimen 5/31-received steroid injection.   6/10- will get shoulder xray as above- and if need be, MRI of R shoulder since pain has come back.   6/11- xray shows likely RTC injury and significant DJD of R shoulder- GH and AC- will start Voltaren gel QID.  20. Nausea: resolved: changed valium and reglan to PRN 21. R>L knee pain  6/7- added tramadol 50 mg q6 hours prn and stopped Oxycodone 10 mg- take oxy only if severe pain; try Tramadol, not tlyenol when hurting BEFORE therapy.  6/10- doing well with tramadol c-on't regimen 22. Back pain  6/16- has trigger point - will try therapy- if not, will try trigger point injection  6/20- will see if needs to injections- 50% better- will see tomorrow 6/18 some relief with flexeril  6/22- pain down to 3-4/10 - will con't regimen 23. Dispo  6/8- spent a long time going over that we, as a team, think he would benefit from more time at SNF- for 1-2 months, not long term- OT offered to speak with pt about this.   6/11- d/w pt and wife who were in room- will see  how he does this next week.   6/14- team conference today to discuss progress.   6/15- d/c 6/22- for now, but will see how doing next week  6/17- set up insurance to cover until 6/22- filled out handicapped placard.   6/24- d/c date set for 6/29- firm d/c date.  24.  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

## 2020-08-07 NOTE — Progress Notes (Signed)
Physical Therapy Session Note  Patient Details  Name: Darren Harrison MRN: [id number] Date of Birth: 1955/02/18  Today's Date: 08/07/2020 PT Individual Time: 0800-0900; 1330-1415 PT Individual Time Calculation (min): 60 min and 45 min  Short Term Goals: Week 5:  PT Short Term Goal 1 (Week 5): =LTG due to ELOS  Skilled Therapeutic Interventions/Progress Updates:    Session 1: Pt received seated in bed, agreeable to PT session. No complaints of pain at rest, does have onset of R lower back pain with mobility. Utilized stretching, repositioning, and pt has lidocaine patch in place for pain management. Bed mobility with Supervision. Slide board transfer to/from w/c with Supervision throughout session. Manual w/c propulsion at mod I level with use of BUE throughout session. Sit to stand in // bars with min A with knees blocked, significant improvement in ability to stand this date! Standing lateral L/R weight shift and mini-squats x 5 reps with BUE support and min to mod A for balance. Ambulation 2 x 10 ft in // bars with BUE support, mod A for balance with knees blocked in stance, close w/c follow for safety. Sit to/from supine on mat table at Supervision level. Reviewed supine therex and stretches for LE and low back, provided handout, see below. Pt returned to bed at end of session, left seated in bed with needs in reach, wife present.  Access Code: [email or web address] URL: [email or web address] Date: 08/07/2020 Prepared by: [name]  Exercises Supine Lower Trunk Rotation - 1 x daily - 7 x weekly - 3 sets - 10 reps Supine Single Knee to Chest Stretch - 1 x daily - 7 x weekly - 3 sets - 10 reps Supine Double Knee to Chest - 1 x daily - 7 x weekly - 3 sets - 10 reps Supine Piriformis Stretch with Foot on Ground - 1 x daily - 7 x weekly - 3 sets - 10 reps Supine Bridge - 1 x daily - 7 x weekly - 3 sets - 10 reps Supine Hip Adduction Isometric with Ball - 1 x daily - 7 x weekly - 3 sets  - 10 reps Hooklying Clamshell with Resistance - 1 x daily - 7 x weekly - 3 sets - 10 reps   Session 2: Pt received seated in bed, agreeable to PT session. No complaints of pain. Bed mobility Supervision. Session focus on transfers bed to/from platform [facility] without use of slide board. Pt able to perform squat pivot transfers bed to/from [facility] with CGA for safety and balance. Pt requires continued practice to elevate buttocks during transfer to clear surfaces. Pt able to simulate clothing management while seated on BSC. Slide board transfer bed to/from w/c with Supervision. Nustep level 6 x 7 min with use of B UE/LE for global strengthening. Pt returned to bed at end of session, left seated in bed with needs in reach, wife present.   Therapy Documentation Precautions:  Precautions Precautions: Cervical, Fall Precaution Comments: reviewed precautions Required Braces or Orthoses: Cervical Brace Cervical Brace: Hard collar, Other (comment) (donned in sitting) Restrictions Weight Bearing Restrictions: No    Therapy/Group: Individual Therapy   [name], PT, DPT, CSRS  08/07/2020, 12:09 PM

## 2020-08-07 NOTE — Progress Notes (Signed)
Pt. Post voided residue was 257 at 6:00 am. Pt. Refused I/O cath. Pt. Stated he will need some time to urinate again. Educated patient and family about the risk involved.

## 2020-08-08 ENCOUNTER — Other Ambulatory Visit (HOSPITAL_COMMUNITY): Payer: Self-pay

## 2020-08-08 LAB — COMPREHENSIVE METABOLIC PANEL WITH GFR
ALT: 19 U/L (ref 0–44)
AST: 25 U/L (ref 15–41)
Albumin: 3.1 g/dL — ABNORMAL LOW (ref 3.5–5.0)
Alkaline Phosphatase: 155 U/L — ABNORMAL HIGH (ref 38–126)
Anion gap: 6 (ref 5–15)
BUN: 14 mg/dL (ref 8–23)
CO2: 23 mmol/L (ref 22–32)
Calcium: 8.7 mg/dL — ABNORMAL LOW (ref 8.9–10.3)
Chloride: 107 mmol/L (ref 98–111)
Creatinine, Ser: 0.72 mg/dL (ref 0.61–1.24)
GFR, Estimated: >60 mL/min (ref >60)
Glucose, Bld: 85 mg/dL (ref 70–99)
Potassium: 4.1 mmol/L (ref 3.5–5.1)
Sodium: 136 mmol/L (ref 135–145)
Total Bilirubin: 0.5 mg/dL (ref 0.3–1.2)
Total Protein: 5.7 g/dL — ABNORMAL LOW (ref 6.5–8.1)

## 2020-08-08 LAB — CBC WITH DIFFERENTIAL/PLATELET
Abs Immature Granulocytes: 0 K/uL (ref 0.00–0.07)
Basophils Absolute: 0 K/uL (ref 0.0–0.1)
Basophils Relative: 1 %
Eosinophils Absolute: 0.1 K/uL (ref 0.0–0.5)
Eosinophils Relative: 2 %
HCT: 35.5 % — ABNORMAL LOW (ref 39.0–52.0)
Hemoglobin: 11.7 g/dL — ABNORMAL LOW (ref 13.0–17.0)
Immature Granulocytes: 0 %
Lymphocytes Relative: 45 %
Lymphs Abs: 1.8 K/uL (ref 0.7–4.0)
MCH: 30.6 pg (ref 26.0–34.0)
MCHC: 33 g/dL (ref 30.0–36.0)
MCV: 92.9 fL (ref 80.0–100.0)
Monocytes Absolute: 0.3 K/uL (ref 0.1–1.0)
Monocytes Relative: 6 %
Neutro Abs: 1.8 K/uL (ref 1.7–7.7)
Neutrophils Relative %: 46 %
Platelets: 156 K/uL (ref 150–400)
RBC: 3.82 MIL/uL — ABNORMAL LOW (ref 4.22–5.81)
RDW: 15.9 % — ABNORMAL HIGH (ref 11.5–15.5)
WBC: 3.9 K/uL — ABNORMAL LOW (ref 4.0–10.5)
nRBC: 0 % (ref 0.0–0.2)

## 2020-08-08 MED ORDER — APIXABAN 5 MG PO TABS
5.0000 mg | ORAL_TABLET | Freq: Two times a day (BID) | ORAL | 1 refills | Status: DC
  Filled 2020-08-08: qty 60, 30d supply, fill #0

## 2020-08-08 NOTE — Progress Notes (Signed)
Patient ID: Darren Harrison, male   DOB: April 02, 1955, 65 y.o.   MRN: [id number]  SW left message for Mya/MA for Dr.Jenkins Office [phone]) to f/u about referral for pt outpatient therapies.   SW returned phone call to Donna/PCP office-Dr. Pacifico/Donna-RN (p:[phone]/f:[phone]) to inform that surgeon's office was contacted and a request was made for them to send the orders. She reported PCP concern is that they have not been involved with patient's care since October 2021 and due to the complexity of his surgery, surgeon needs to  manage.   *SW returned phone call/left message for Mya with Dr. Arnoldo Morale Office and waiting on f/u.  Loralee Pacas, MSW, [place] Office: [phone] Cell: [phone] Fax: [phone]

## 2020-08-08 NOTE — Progress Notes (Signed)
Occupational Therapy Session Note  Patient Details  Name: Darren Harrison MRN: [id number] Date of Birth: 05-04-1955  Today's Date: 08/08/2020 OT Individual Time: 0700-0825 OT Individual Time Calculation (min): 85 min    Short Term Goals: Week 5:  OT Short Term Goal 1 (Week 5): STG=LTG secondary to ELOS  Skilled Therapeutic Interventions/Progress Updates:    Pt resting in bed upon arrival and ready to get OOB for therapy. Pt donned pants at bed level with supervision. Supine>sit EOB with supervision. Pt donned pullover shirt with supervision. OT intervention with focus on bed<>DABSC squat/scoot transfers. Pt performed transferX3 with CGA/supervision. Pt commented that he "thought" he could just transfer to his toilet at home from his w/c without BSC. Advised pt to practice with assistance from wife before attempting independently. Pt propelled w/c to gym and performed squat pivot transfer w/c<>EOM to Rt and Lt with supervision. Pt practiced propelling w/c up/down ramp in gym. Pt completed cycle X 2 with supervision. Pt able to safely control w/c on descent. Pt also completed SciFit BUE therex 2x5 level 3. Pt returned to room and remained in w/c with all needs within reach.   Therapy Documentation Precautions:  Precautions Precautions: Cervical, Fall Precaution Comments: reviewed precautions Required Braces or Orthoses: Cervical Brace Cervical Brace: Hard collar, Other (comment) (donned in sitting) Restrictions Weight Bearing Restrictions: No   Pain:  "My back feels pretty good this morning"  Therapy/Group: Individual Therapy  [name] 08/08/2020, 8:30 AM

## 2020-08-08 NOTE — Progress Notes (Signed)
Occupational Therapy Session Note  Patient Details  Name: Darren Harrison MRN: [id number] Date of Birth: January 06, 1956  Today's Date: 08/08/2020 OT Individual Time: 1031-1100 OT Individual Time Calculation (min): 29 min    Short Term Goals: Week 4:  OT Short Term Goal 1 (Week 4): STG=LTG secondary to ELOS OT Short Term Goal 1 - Progress (Week 4): Progressing toward goal  Skilled Therapeutic Interventions/Progress Updates:    Pt received up in w/c in room and consented to OT tx. Pt reports he was tired from all his UB exercises this morning as well as BSC transfer training. Pt reports it is all going well and he is getting more confident. Pt req min A to don C-collar accurately and donned gloves for w/c mobility with setup. Pt seen for higher level [facility] tasks this session including tasks and cork board activity for increased fine motor skills for ADLs. Pt instructed to alternate hands for improved B [facility] and required cuing to take his time for accuracy. Pt able to complete entire activity with time. After tx, pt wheeled back to room, left up in w/c and with all needs met.   Therapy Documentation Precautions:  Precautions Precautions: Cervical, Fall Precaution Comments: reviewed precautions Required Braces or Orthoses: Cervical Brace Cervical Brace: Hard collar, Other (comment) (donned in sitting) Restrictions Weight Bearing Restrictions: No    Pain: Pain Assessment Pain Scale: 0-10 Pain Score: 0-No pain    Therapy/Group: Individual Therapy  Rosslyn Pasion 08/08/2020, 10:43 AM

## 2020-08-08 NOTE — Progress Notes (Signed)
Physical Therapy Session Note  Patient Details  Name: Darren Harrison MRN: [id number] Date of Birth: 08-09-1955  Today's Date: 08/08/2020 PT Individual Time: 1445-1545 PT Individual Time Calculation (min): 60 min   Short Term Goals: Week 5:  PT Short Term Goal 1 (Week 5): =LTG due to ELOS  Skilled Therapeutic Interventions/Progress Updates:    Pt received seated in w/c in room, agreeable to PT session. No complaints of pain. Pt is at mod I level for w/c mobility with use of BUE, independent for management of w/c parts. Squat pivot transfer at [facility] level throughout session. Session focus on gait training and LE NMR in standing via use of LiteGait. Ambulation x 150 ft with LiteGait with assist x 2 needed: assist needed for steering device and for lateral weight shift and occasional assist with placement of LLE. Pt exhibits improved ability to hold himself up in standing with decreased assist required from body weight supported system. Pt also exhibits decreased knee buckling during gait as compared to last trial with LiteGait. Pt requires 2 standing rest breaks during gait. Pt does continue to exhibit LLE scissoring at times and narrow BOS. Sit to stand x 2 reps to RW with mod to max A x 1 and knees blocked by a 2nd person. Pt attempts to perform standing marches, unable to bear full weight on RLE. Pt does show improved overall strength with ability to stand to RW this date! Pt returned to bed at end of session, needs in reach.  Therapy Documentation Precautions:  Precautions Precautions: Cervical, Fall Precaution Comments: reviewed precautions Required Braces or Orthoses: Cervical Brace Cervical Brace: Hard collar, Other (comment) (donned in sitting) Restrictions Weight Bearing Restrictions: No   Therapy/Group: Individual Therapy   [name], PT, DPT, CSRS  08/08/2020, 5:13 PM

## 2020-08-08 NOTE — Progress Notes (Signed)
Pt refused bowel program at this time. Handoff given to night shift nurse.  Sheela Stack, LPN

## 2020-08-08 NOTE — Progress Notes (Signed)
Pt scanned for 431, refused cath stating he's "gone 15 days without needing it" doesn't want to start again for 1 time. Pt educated, also reported that he will talk with doctor about it in morning.

## 2020-08-08 NOTE — Progress Notes (Signed)
PROGRESS NOTE   Subjective/Complaints: Back pain MUCH better- maybe 1-3/10 lately- but much better controlled- just not gone.  Woken up at 4:30 am to pee- scan showed >250cc and that he should cath after he voided- he declined saying gone 15 days without a cath, so doesn't want one- Asking to see if can avoid this occurring again? Did explained, in front of nurse and Tom- OT- that increased amount in bladder increases the risk of UTI and they can make him very ill, not just mild.  Pt voiced understanding.   ROS:   Pt denies SOB, abd pain, CP, N/V/C/D, and vision changes   Objective:   No results found. Recent Labs    08/08/20 0603  WBC 3.9*  HGB 11.7*  HCT 35.5*  PLT 156      Recent Labs    08/08/20 0603  NA 136  K 4.1  CL 107  CO2 23  GLUCOSE 85  BUN 14  CREATININE 0.72  CALCIUM 8.7*       Intake/Output Summary (Last 24 hours) at 08/08/2020 1312 Last data filed at 08/08/2020 1248 Gross per 24 hour  Intake 400 ml  Output 2140 ml  Net -1740 ml         Physical Exam: Vital Signs Blood pressure 100/77, pulse 62, temperature 98.3 F (36.8 C), resp. rate 20, height 5\' 10"  (1.778 m), weight 74.8 kg, SpO2 96 %.    General: awake, alert, appropriate, sitting up in bedside chair, OT and nurse in room; NAD HENT: conjugate gaze; oropharynx moist CV: regular rate; no JVD Pulmonary: CTA B/L; no W/R/R- good air movement GI: soft, NT, ND, (+)BS Psychiatric: appropriate; interactive- asking questions Neurological: alert- MAS of 1+ in LEs  Musculoskeletal:still TTP R low paraspinals- still painful/TTP Bilateral knee varus deformity ;    General: TTP over R shoulder- over biceps tendon as well as R GH joint and AC joint still TTP; crepitus with ROM     Comments: RUE- biceps 3/5, WE 4-/5, triceps 3/5, grip 3+/5, and FA 1/5- developing muscle atrophy in RUE as well as in teres and upper traps on R>L LUE- all  muscles 4+/5 RLE- HF 3-/5, KE 3+/5, DF 3-/5, and PF 3-/5, LLE- HF 4+/5, KE 5-/5, DF 4/5 and PF 3-/5;  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 Still having a few spasms/extensor tone-  Sensation: RUE C4 ok- decreased from C5 to S1 on R LUE- C5 OK- decreased from C6 to S1 on L    Assessment/Plan: 1. Functional deficits which require 3+ hours per day of interdisciplinary therapy in a comprehensive inpatient rehab setting. Physiatrist is providing close team supervision and 24 hour management of active medical problems listed below. Physiatrist and  rehab team continue to assess barriers to discharge/monitor patient progress toward functional and medical goals  Care Tool:  Bathing  Bathing activity did not occur: Refused Body parts bathed by patient: Right arm, Left arm, Chest, Abdomen, Front perineal area, Right upper leg, Left upper leg, Right lower leg, Left lower leg   Body parts bathed by helper: Buttocks     Bathing assist Assist Level: Minimal Assistance - Patient > 75%     Upper Body Dressing/Undressing Upper body dressing   What is the patient wearing?: Pull over shirt, Orthosis    Upper body assist Assist Level: Minimal Assistance - Patient > 75%    Lower Body Dressing/Undressing Lower body dressing      What is the patient wearing?: Pants     Lower body assist Assist for lower body dressing: Minimal Assistance - Patient > 75%     Toileting Toileting    Toileting assist Assist for toileting: Minimal Assistance - Patient > 75%     Transfers Chair/bed  transfer  Transfers assist     Chair/bed transfer assist level: Supervision/Verbal cueing (slide board)     Locomotion Ambulation   Ambulation assist   Ambulation activity did not occur: Safety/medical concerns  Assist level: 2 helpers Assistive device: Parallel bars Max distance: 10'   Walk 10 feet activity   Assist  Walk 10 feet activity did not occur: Safety/medical concerns  Assist level: 2 helpers Assistive device: Parallel bars   Walk 50 feet activity   Assist Walk 50 feet with 2 turns activity did not occur: Safety/medical concerns  Assist level: Dependent - Patient 0% Assistive device: Lite Gait    Walk 150 feet activity   Assist Walk 150 feet activity did not occur: Safety/medical concerns  Assist level: Dependent - Patient 0% Assistive device: Lite Gait    Walk 10 feet on uneven surface  activity   Assist Walk 10 feet on uneven surfaces activity did not occur: Safety/medical concerns         Wheelchair     Assist Will patient use wheelchair at discharge?: Yes Type of Wheelchair: Manual    Wheelchair assist level: Independent Max wheelchair distance: 150'    Wheelchair 50 feet with 2 turns activity    Assist        Assist Level: Independent   Wheelchair 150 feet activity     Assist      Assist Level: Independent   Blood pressure 100/77, pulse 62, temperature 98.3 F (36.8 C), resp. rate 20, height 5\' 10"  (1.778 m), weight 74.8 kg, SpO2 96 %.  Medical Problem List and Plan: 1. 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  Status post C3-4 laminectomy with posterior arthrodesis cervical instrumentation 06/29/2020.  Cervical brace as directed.             -patient may  Shower with neck brace/incision covered             -ELOS/Goals: ~3 weeks- min A- -will order PRAFOs and have pt wear nightly to prevent ankle contractures  5/26- con't PT and OT- spent 20 minutes going over ASIA  level, and info about SCI including neurogenic bowel and bladder, spasticity and increased risk of DVT/pressure ulcers.   --con't PT and OT- discussed at length with pt goal is Min A- if so, will need 24/7 A at home- I don't think he'll get to Mod I (explained this)- made agreement if pt does, will reward with singing! But think  this is unlikely- I would think about short term SNF-   6/23- trying to keep til next Wednesday/Thursday  since we truly feel this will get pt home by then- con't PT and OT   Continue PT and OT- working on therapeutic intervention for low back since limiting therapy. Continue prednisone.   Con't PT and OT- prednisone is done as of yesterday- d/c Wednesday 6/29- firm d/c.  2.  Impaired mobility -DVT/anticoagulation:  Vascular study reviewed and negative.  5/26- will need for at least 2 months- maybe 3 months total from surgery. 6/3- will need for a total of 3 months  6/22- will switch to Eliquis due to Afib and stop lovenox. Patient says he was not started on oral AC for afib prior-  6/29- will let PCP determine if continues for Afib? Will check if needs to put back on Lovenox  3. Lower back spasms/R shoulder pain: Continue Baclofen 5 mg 3 times daily, oxycodone and Flexeril as needed  5/26- pain pretty well controlled- con't regimen  5/28-   con't regimen prn  5/31- will do R shoulder injection today.  6/3- Shoulder injection 5/31- R shoulder pain almost gone with injection   6/7- Added tramadol- got rid of 10 mg Oxy-   6/10- taking tramadol- works for R >L knee, but shoulder now acting up again- will get xray and go from there- con't regimen  6/23- taking tramadol prn- back pain more this AM- 6/10- con't regimen prn  6/24- will try Prednisone 20 mg daily x 4 days and Flexeril add 10 mg TID prn.   6/27- pain MUCH better- 1-3/10 now -con't regimen 4. Mood: Provide emotional support  6/15- pt insisting to d/c Celexa- will discontinue.              -antipsychotic agents:  N/A 5. Neuropsych: This patient is capable of making decisions on his own behalf. 6. Skin/Wound Care: Routine skin checks 7. Fluids/Electrolytes/Nutrition: Routine in and outs with follow-up chemistries 8.  Atrial fibrillation.  Lopressor 25 mg twice daily.  Cardiac rate controlled  5/28 overall controlled- con't regimen  5/31- will decrease Metoprolol to 12.5 mg daily (was qday) since heart rate on low side and BP low- don't want him orthostatic due to SCI.   6/10- HR 60 this AM- holding metoprolol more than giving. Likely worse due to SCI bradycardia- if doesn't improve, will call Cards next week.   6/17- HR 49 this AM- due to SCI bradycardia. 6/20- Bradycardia better today- con't off B blocker.   6/22- will change to Eliquis from Lovenox -since back in Afib.   6/23- HR good this AM- con't regimen  6/24- HR well cntrolled- con't Eliquis- not on BP /rate lowering meds  6/27- con't Eliquis for now.  9.  Hypertension with orthostatic hypotension.  Cozaar 50 mg daily.  Monitor with increased mobility  5/28 elevated today.  5/30- BP 90s/50s- will monitor for orthostatic hypotension  5/31- will decrease metoprolol as above  6/8- per pt , no dizziness, however BP low- will stop Losartan and con't metoprolol since for rate control.   6/10- BP soft again 90/s50s- off losartan- only on Metoprolol lowest dose- will call Cards next week if doesn't improve.   6/12- HR 67- con't regimen Vitals:   08/07/20 1921 08/08/20 0416  BP: 100/83 100/77  Pulse: 69 62  Resp: 20 20  Temp: 98.1 F (36.7 C) 98.3 F (36.8 C)  SpO2: 91% 96%   6/22- Heart rate in 60s this  AM- will start Eliquis since back in Afib.  10.  BPH/UTI.  Was on Flomax 0.4 mg daily.   UTI greater than 80,000 Staphylococcus/Enterococcus completing course of Bactrim- please see neurogenic bladder-  11.  Surgical PCR screening positive.  Bactroban as directed 12. Hyperthyroidism- had lost 70 lbs in 6 months- in middle of treatment  6/1- per  Endo, want labs checked before appointment 6/7- will order thyroid labs for tomorrow, so they are back for appointment.   6/2- TSH <0.010 and free T4 1.03- free T3 pending.   6/3- pt to have endo phone appt 6/9- asked him to let therapy know to schedule therapy around it.   6/7- has appt today per pt- will let me know if there's any changes!  6/8- allowed to stop Prednisone and will wait on Synthroid for today.   6/14- stopping Protonix since pt doesn't need- was since previous Prednisone dose that's stopped.  13. Fatty liver-will need to be careful with any medicine going through liver. Placed nursing order to request that protein shake recommended by his hepatologist be given at night  6/7- explained to pt that tylenol can affect liver, not the other way around.  14. Neurogenic bladder- will start on I/o caths q6 hours prn and increase Flomax to 0.8 mg with supper  6/7- will change order to cath if volumes >250cc  6/10- will call Urology for advise- is usually requiring caths still- but voiding some- don't want pt to get Foley (if goes to SNF).  6/16- no cath required since Monday- will might not need to go home with caths.   6/20- no caths for 7 days- but room odor of urine?will ask nursing/therapy.  6/21- will check U/A and Cx since room smells of urine and having more incontinence which can be due to bladder spasms, which can come with UTI? 6/22- has UTI- waiting for Cx.  6/23-6/26 urine Cx being re-incubated-will continue Bactrim and monitor- room doesn't smell of urine anymore/today 15.  Neurogenic bowel- doesn't have good control- will start with suppository- and dig stim and see if can withdraw any of them over time.   6/2- Pt having good results with bowel program- still requiring some dig stim, but less.   6/5: add miralax mixed in prune juice to dinner.   6/6- BM on toilet with dig stim/supp- improving!- con't regimen  6/25-6/26: continue bowel program on toilet- working well 16.  Spasticity- will increase Baclofen to 10 mg TID and con't Flexeril.   Continue flexeril HS  5/31- will d/w pt going up on baclofen cannot do zanaflex due to low BP and cannot do dantrolene due to liver issues.  6/14- spasms getting a little worse- wait to increase Baclofen since don't have much more to go up without side effects.   17. Orthostatic hypotension  5/26- added TEDs and Abd binder during day.  5/31- decreased metoprolol   6/8- will stop Losartan 18. Ileus     Resolved tolerates Regular diet 19. R shoulder pain  5/30- will try to do R shoulder injection tomorrow 5/31- R shoulder injections for subacromial and RTC/posterior shoulder injection today  6/2-6/5 pain MUCH better- continue regimen 5/31-received steroid injection.   6/10- will get shoulder xray as above- and if need be, MRI of R shoulder since pain has come back.   6/11- xray shows likely RTC injury and significant DJD of R shoulder- GH and AC- will start Voltaren gel QID.  20. Nausea: resolved: changed valium and reglan to  PRN 21. R>L knee pain  6/7- added tramadol 50 mg q6 hours prn and stopped Oxycodone 10 mg- take oxy only if severe pain; try Tramadol, not tlyenol when hurting BEFORE therapy.  6/10- doing well with tramadol c-on't regimen 22. Back pain  6/16- has trigger point - will try therapy- if not, will try trigger point injection  6/20- will see if needs to injections- 50% better- will see tomorrow 6/18 some relief with flexeril  6/22- pain down to 3-4/10 - will con't regimen  6/27- down to 1-3/10 all the time since prednisone- will con't off prednisone.  61. Dispo  6/8- spent a long time going over that we, as a team, think he would benefit from more time at SNF- for 1-2 months, not long term- OT offered to speak with pt about this.   6/11- d/w pt and wife who were in room- will see how he does this next week.   6/14- team conference today to discuss progress.   6/15- d/c 6/22- for now, but will see how  doing next week  6/17- set up insurance to cover until 6/22- filled out handicapped placard.   6/24- d/c date set for 6/29- firm d/c date.  24.  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

## 2020-08-09 ENCOUNTER — Other Ambulatory Visit (HOSPITAL_COMMUNITY): Payer: Self-pay

## 2020-08-09 MED ORDER — RIVAROXABAN 20 MG PO TABS
20.0000 mg | ORAL_TABLET | Freq: Every day | ORAL | 2 refills | Status: DC
  Filled 2020-08-09: qty 30, 30d supply, fill #0

## 2020-08-09 MED ORDER — RIVAROXABAN 20 MG PO TABS
20.0000 mg | ORAL_TABLET | Freq: Every day | ORAL | Status: DC
  Administered 2020-08-09: 20 mg via ORAL
  Filled 2020-08-09: qty 1

## 2020-08-09 NOTE — Progress Notes (Signed)
PROGRESS NOTE   Subjective/Complaints:  Pt reports had BM on toilet BEFORE got suppository, so didn't get it-declined supp since already had BM- I agree with his thought process- explained needs to go 1 week having normal Bms on toilet before stops the suppository/dig stim.   Also said has been contacted to schedule Neurorehab appt for outpt therapy.   Verifying tomorrow is d/c date- yes It is.  Also notes that Cards and PCP knew he wa sin Afib, but didn't discuss Anticoagulation with him- at all- explained want to send him home on Eliquis, but if for some reason not allowed to take by Liver doctor- if so, has to restart Lovenox for a total of 3 months form surgery.    ROS:   Pt denies SOB, abd pain, CP, N/V/C/D, and vision changes   Objective:   No results found. Recent Labs    08/08/20 0603  WBC 3.9*  HGB 11.7*  HCT 35.5*  PLT 156      Recent Labs    08/08/20 0603  NA 136  K 4.1  CL 107  CO2 23  GLUCOSE 85  BUN 14  CREATININE 0.72  CALCIUM 8.7*       Intake/Output Summary (Last 24 hours) at 08/09/2020 0815 Last data filed at 08/09/2020 0600 Gross per 24 hour  Intake 460 ml  Output 2100 ml  Net -1640 ml         Physical Exam: Vital Signs Blood pressure 107/86, pulse 74, temperature 98.7 F (37.1 C), temperature source Oral, resp. rate 18, height 5\' 10"  (1.778 m), weight 74.8 kg, SpO2 96 %.     General: awake, alert, appropriate, in shower with OT at side- seated; NAD HENT: conjugate gaze; oropharynx moist- wearing cervical collar CV: regular rate- in Afib; no JVD Pulmonary: CTA B/L; no W/R/R- good air movement GI: soft, NT, ND, (+)BS Psychiatric: appropriate- asking appropriate questions Neurological: Ox3; MAS of 1+ in LEs Musculoskeletal:still TTP R low paraspinals- still painful/TTP Bilateral knee varus deformity ;    General: TTP over R shoulder- over biceps tendon as well as R GH  joint and AC joint still TTP; crepitus with ROM     Comments: RUE- biceps 3/5, WE 4-/5, triceps 3/5, grip 3+/5, and FA 1/5- developing muscle atrophy in RUE as well as in teres and upper traps on R>L LUE- all muscles 4+/5 RLE- HF 3-/5, KE 3+/5, DF 3-/5, and PF 3-/5, LLE- HF 4+/5, KE 5-/5, DF 4/5 and PF 3-/5;  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 Still having a few spasms/extensor tone-  Sensation: RUE C4 ok- decreased from C5 to S1 on R LUE- C5 OK- decreased from C6 to S1 on L  Assessment/Plan: 1. Functional deficits which require 3+ hours per day of interdisciplinary therapy in a comprehensive inpatient rehab setting. Physiatrist is providing close team supervision and 24 hour management of active medical problems listed below. Physiatrist and rehab team continue to assess barriers to discharge/monitor patient progress toward functional and medical goals  Care Tool:  Bathing  Bathing activity did not occur: Refused Body parts bathed by patient: Right arm, Left arm, Chest, Abdomen, Front perineal area, Right upper leg, Left upper leg, Right lower leg, Left lower leg   Body parts bathed by helper: Buttocks     Bathing assist Assist Level: Minimal Assistance - Patient > 75%     Upper Body Dressing/Undressing Upper body dressing   What is the patient wearing?: Pull over shirt, Orthosis    Upper body assist Assist Level: Minimal Assistance - Patient > 75%    Lower Body Dressing/Undressing Lower body dressing      What is the patient wearing?: Pants     Lower  body assist Assist for lower body dressing: Minimal Assistance - Patient > 75%     Toileting Toileting    Toileting assist Assist for toileting: Minimal Assistance - Patient > 75%     Transfers Chair/bed transfer  Transfers assist     Chair/bed transfer assist level: Contact Guard/Touching assist (squat pivot)     Locomotion Ambulation   Ambulation assist   Ambulation activity did not occur: Safety/medical concerns  Assist level: Dependent - Patient 0% Assistive device: Lite Gait Max distance: 150'   Walk 10 feet activity   Assist  Walk 10 feet activity did not occur: Safety/medical concerns  Assist level: Dependent - Patient 0% Assistive device: Lite Gait   Walk 50 feet activity   Assist Walk 50 feet with 2 turns activity did not occur: Safety/medical concerns  Assist level: Dependent - Patient 0% Assistive device: Lite Gait    Walk 150 feet activity   Assist Walk 150 feet activity did not occur: Safety/medical concerns  Assist level: Dependent - Patient 0% Assistive device: Lite Gait    Walk 10 feet on uneven surface  activity   Assist Walk 10 feet on uneven surfaces activity did not occur: Safety/medical concerns         Wheelchair     Assist Will patient use wheelchair at discharge?: Yes Type of Wheelchair: Manual    Wheelchair assist level: Independent Max wheelchair distance: 150'    Wheelchair 50 feet with 2 turns activity    Assist        Assist Level: Independent   Wheelchair 150 feet activity     Assist      Assist Level: Independent   Blood pressure 107/86, pulse 74, temperature 98.7 F (37.1 C), temperature source Oral, resp. rate 18, height 5\' 10"  (1.778 m), weight 74.8 kg, SpO2 96 %.  Medical Problem List and Plan: 1. 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  Status post C3-4 laminectomy with posterior arthrodesis cervical instrumentation 06/29/2020.  Cervical  brace as directed.             -patient may  Shower with neck brace/incision covered             -ELOS/Goals: ~3 weeks- min A- -will order PRAFOs and have pt wear nightly to prevent ankle contractures  5/26- con't PT and OT- spent 20 minutes going over ASIA level, and info about SCI including neurogenic bowel and bladder, spasticity and increased risk of DVT/pressure  ulcers.   --con't PT and OT- discussed at length with pt goal is Min A- if so, will need 24/7 A at home- I don't think he'll get to Mod I (explained this)- made agreement if pt does, will reward with singing! But think this is unlikely- I would think about short term SNF-   6/23- trying to keep til next Wednesday/Thursday  since we truly feel this will get pt home by then- con't PT and OT   Continue PT and OT- working on therapeutic intervention for low back since limiting therapy. Continue prednisone.   Con't PT and OT- prednisone is done as of yesterday- d/c Wednesday 6/29- firm d/c.   -con't PT and OT- family teaching- d/c tomorrow 2.  Impaired mobility -DVT/anticoagulation:  Vascular study reviewed and negative.  5/26- will need for at least 2 months- maybe 3 months total from surgery. 6/3- will need for a total of 3 months  6/22- will switch to Eliquis due to Afib and stop lovenox. Patient says he was not started on oral AC for afib prior-  6/27- will let PCP determine if continues for Afib? Will check if needs to put back on Lovenox  6/28- if Liver physician doesn't want him on Eliquis, he would need to restart Lovenox 40 mg daily x 3 months from surgery date- 5/17- so until mid August- Asked pt to ask PCP and liver doctor- will con't Eliquis for now.  3. Lower back spasms/R shoulder pain: Continue Baclofen 5 mg 3 times daily, oxycodone and Flexeril as needed  5/26- pain pretty well controlled- con't regimen  5/28-   con't regimen prn  5/31- will do R shoulder injection today.  6/3- Shoulder injection 5/31- R shoulder pain  almost gone with injection   6/7- Added tramadol- got rid of 10 mg Oxy-   6/10- taking tramadol- works for R >L knee, but shoulder now acting up again- will get xray and go from there- con't regimen  6/23- taking tramadol prn- back pain more this AM- 6/10- con't regimen prn  6/24- will try Prednisone 20 mg daily x 4 days and Flexeril add 10 mg TID prn.   6/28- pain controlled s/p prednisone and with prn tramadol- con't regimen 4. Mood: Provide emotional support  6/15- pt insisting to d/c Celexa- will discontinue.              -antipsychotic agents: N/A 5. Neuropsych: This patient is capable of making decisions on his own behalf. 6. Skin/Wound Care: Routine skin checks 7. Fluids/Electrolytes/Nutrition: Routine in and outs with follow-up chemistries 8.  Atrial fibrillation.  Lopressor 25 mg twice daily.  Cardiac rate controlled  5/31- will decrease Metoprolol to 12.5 mg daily (was qday) since heart rate on low side and BP low- don't want him orthostatic due to SCI.  6/20- Bradycardia better today- con't off B blocker.   6/22- will change to Eliquis from Lovenox -since back in Afib.   6/23- HR good this AM- con't regimen  6/24- HR well cntrolled- con't Eliquis- not on BP /rate lowering meds 6/28- HR 72 this AM- and >60 in last 24 hours- con't regimen inc luding Eliquis 9.  Hypertension with orthostatic hypotension.  Cozaar 50 mg daily.  Monitor with increased mobility  5/28 elevated today.  5/30- BP 90s/50s- will monitor for orthostatic hypotension  5/31- will decrease metoprolol as above  6/8- per pt , no dizziness, however BP low- will stop Losartan and con't metoprolol since for rate control.   6/10-  BP soft again 90/s50s- off losartan- only on Metoprolol lowest dose- will call Cards next week if doesn't improve.   6/12- HR 67- con't regimen Vitals:   08/08/20 1922 08/09/20 0256  BP: 118/74 107/86  Pulse: 80 74  Resp: 18 18  Temp: 98.6 F (37 C) 98.7 F (37.1 C)  SpO2: 99% 96%    6/22- Heart rate in 60s this AM- will start Eliquis since back in Afib.  10.  BPH/UTI.  Was on Flomax 0.4 mg daily.   UTI greater than 80,000 Staphylococcus/Enterococcus completing course of Bactrim- please see neurogenic bladder-  11.  Surgical PCR screening positive.  Bactroban as directed 12. Hyperthyroidism- had lost 70 lbs in 6 months- in middle of treatment  6/1- per Endo, want labs checked before appointment 6/7- will order thyroid labs for tomorrow, so they are back for appointment.   6/2- TSH <0.010 and free T4 1.03- free T3 pending.   6/3- pt to have endo phone appt 6/9- asked him to let therapy know to schedule therapy around it.   6/7- has appt today per pt- will let me know if there's any changes!  6/8- allowed to stop Prednisone and will wait on Synthroid for today.   6/14- stopping Protonix since pt doesn't need- was since previous Prednisone dose that's stopped.  13. Fatty liver-will need to be careful with any medicine going through liver. Placed nursing order to request that protein shake recommended by his hepatologist be given at night  6/7- explained to pt that tylenol can affect liver, not the other way around. 6/28- will con't Eliquis for now- but pt to ask liver doctor if needs to go back on Lovenox or con't Eliquis.   14. Neurogenic bladder- will start on I/o caths q6 hours prn and increase Flomax to 0.8 mg with supper  6/7- will change order to cath if volumes >250cc  6/10- will call Urology for advise- is usually requiring caths still- but voiding some- don't want pt to get Foley (if goes to SNF).  6/16- no cath required since Monday- will might not need to go home with caths.   6/20- no caths for 7 days- but room odor of urine?will ask nursing/therapy.  6/21- will check U/A and Cx since room smells of urine and having more incontinence which can be due to bladder spasms, which can come with UTI? 6/22- has UTI- waiting for Cx.  6/23-6/26 urine Cx being  re-incubated-will continue Bactrim and monitor- room doesn't smell of urine anymore/today  6/28- refused cath yesterday AM- really needs to double void if need be- will change bladder scans to q8 hours  15.  Neurogenic bowel- doesn't have good control- will start with suppository- and dig stim and see if can withdraw any of them over time.   6/2- Pt having good results with bowel program- still requiring some dig stim, but less.   6/5: add miralax mixed in prune juice to dinner.   6/6- BM on toilet with dig stim/supp- improving!- con't regimen  6/25-6/26: continue bowel program on toilet- working well  6/28- didn't do bowel program last night- has good BM on toilet- if this continues x1 week, don't need to do bowel program 16. Spasticity- will increase Baclofen to 10 mg TID and con't Flexeril.   Continue flexeril HS  5/31- will d/w pt going up on baclofen cannot do zanaflex due to low BP and cannot do dantrolene due to liver issues.  6/14- spasms getting a little worse- wait  to increase Baclofen since don't have much more to go up without side effects.   17. Orthostatic hypotension  5/26- added TEDs and Abd binder during day.  5/31- decreased metoprolol   6/8- will stop Losartan 18. Ileus     Resolved tolerates Regular diet 19. R shoulder pain  5/30- will try to do R shoulder injection tomorrow 5/31- R shoulder injections for subacromial and RTC/posterior shoulder injection today  6/2-6/5 pain MUCH better- continue regimen 5/31-received steroid injection.   6/10- will get shoulder xray as above- and if need be, MRI of R shoulder since pain has come back.   6/11- xray shows likely RTC injury and significant DJD of R shoulder- GH and AC- will start Voltaren gel QID.  20. Nausea: resolved: changed valium and reglan to PRN 21. R>L knee pain  6/7- added tramadol 50 mg q6 hours prn and stopped Oxycodone 10 mg- take oxy only if severe pain; try Tramadol, not tlyenol when hurting BEFORE  therapy.  6/10- doing well with tramadol c-on't regimen 22. Back pain  6/16- has trigger point - will try therapy- if not, will try trigger point injection  6/20- will see if needs to injections- 50% better- will see tomorrow 6/18 some relief with flexeril  6/22- pain down to 3-4/10 - will con't regimen  6/27- down to 1-3/10 all the time since prednisone- will con't off prednisone.  7. Dispo  6/8- spent a long time going over that we, as a team, think he would benefit from more time at SNF- for 1-2 months, not long term- OT offered to speak with pt about this.   6/11- d/w pt and wife who were in room- will see how he does this next week.   6/14- team conference today to discuss progress.   6/15- d/c 6/22- for now, but will see how doing next week  6/17- set up insurance to cover until 6/22- filled out handicapped placard.   6/24- d/c date set for 6/29- firm d/c date.   6/28- d/c tomorrow- has appt for outpt Neurorehab for PT and OT 24.  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

## 2020-08-09 NOTE — Progress Notes (Signed)
Physical Therapy Session Note  Patient Details  Name: Darren Harrison MRN: [id number] Date of Birth: 1955/02/17  Today's Date: 08/09/2020 PT Individual Time: 1000-1100; 1552-1650 PT Individual Time Calculation (min): 60 min and 58 min  Short Term Goals: Week 5:  PT Short Term Goal 1 (Week 5): =LTG due to ELOS  Skilled Therapeutic Interventions/Progress Updates:    Session 1: Pt received seated in w/c in room, agreeable to PT session. Minimal complaints of pain in low back this date, declines intervention. Pt is at mod I level for w/c mobility up to 200 ft with use of BUE. Pt is independent for management of w/c parts. Pt performs squat pivot transfers at [place] level throughout session, slide board transfers at mod I level. Bed mobility mod I with use of bedrail and flat bed, pt owns bedrail at home. Sit to stand in // bars with min A increasing to mod A with onset of fatigue, B knees blocked. Ambulation 4 x 10 ft in // bars with mod A with knees blocked in stance, close w/c follow from a 2nd person for safety. Pt requires some assist with LE placement due to narrow BOS and ER of RLE. Standing mini-squats x 5 reps in // bars with mod A for balance, knees blocked. Pt requesting to be weighed this session. Per scale in dayroom patient currently weighs 178.7 lbs. Pt left seated in w/c in room with needs in reach at end of session.  Session 2: Pt received seated in bed, agreeable to PT session. Bed mobility mod I with use of bedrail. Slide board transfer to w/c mod I. Manual w/c propulsion at mod I level. Squat pivot transfer w/c to/from mat table going both L and R with CGA, cues for head/hips relationship during transfer and clearing buttocks. Nustep level 6 x 7 min with use of B UE/LE for global strengthening. Pt left seated in w/c in room with needs in reach at end of session.  Therapy Documentation Precautions:  Precautions Precautions: Cervical, Fall Precaution Comments: reviewed  precautions Required Braces or Orthoses: Cervical Brace Cervical Brace: Hard collar, Other (comment) (donned in sitting) Restrictions Weight Bearing Restrictions: No   Therapy/Group: Individual Therapy   [name], PT, DPT, CSRS  08/09/2020, 11:29 AM

## 2020-08-09 NOTE — Progress Notes (Signed)
Occupational Therapy Session Note  Patient Details  Name: Darren Harrison MRN: [id number] Date of Birth: Aug 01, 1955  Today's Date: 08/09/2020 OT Individual Time: 0700-0825 OT Individual Time Calculation (min): 85 min    Short Term Goals: Week 5:  OT Short Term Goal 1 (Week 5): STG=LTG secondary to ELOS  Skilled Therapeutic Interventions/Progress Updates:    OT intervention with focus on bathing at shower level, SB tranfsers, sitting balance, dressing seated EOB and bed level, ongoing discharge planning, and safety awareness. Bathing with min A at shower level. Pt requires assistance with donning cervical collar and Ted hose but completes all other dressing tasks without assistance at bed level and seated EOB. Mod I for SB tranfsers to level surface. Clothing management seated in w/c or DABSC with mod I. Pt remained in w/c with all needs within reach.  Therapy Documentation Precautions:  Precautions Precautions: Cervical, Fall Precaution Comments: reviewed precautions Required Braces or Orthoses: Cervical Brace Cervical Brace: Hard collar, Other (comment) (donned in sitting) Restrictions Weight Bearing Restrictions: No   Pain: Pt reports his Rt lower back is "ok" and his Rt shoulder is bothering him "a little"; Pt called RN for Lidocaine patch for back; warm shower   Therapy/Group: Individual Therapy  [name] 08/09/2020, 8:29 AM

## 2020-08-09 NOTE — Patient Care Conference (Signed)
Inpatient RehabilitationTeam Conference and Plan of Care Update Date: 08/09/2020   Time: 11:13 AM    Patient Name: Darren Harrison      Medical Record Number: [id number]  Date of Birth: 15-Jan-1956 Sex: Male         Room/Bed: 4M10C/4M10C-01 Payor Info: Payor: C[employer] / Plan: CIGNA MANAGED / Product Type: *No Product type* /    Admit Date/Time:  07/05/2020  3:07 PM  Primary Diagnosis:  Cervical myelopathy [facility])  Hospital Problems: Principal Problem:   Cervical myelopathy ([place]) Active Problems:   Pressure injury of skin   Neurogenic bowel   Neurogenic bladder   Spasticity    Expected Discharge Date: Expected Discharge Date: 08/10/20  Team Members Present: Physician leading conference: Dr. Courtney Heys Care Coodinator Present: Loralee Pacas, LCSWA;Gillis Boardley Creig Hines, RN, BSN, CRRN Nurse Present: Dorthula Nettles, RN PT Present: [name], PT OT Present: Roanna Epley, COTA;Jennifer Tamala Julian, OT PPS Coordinator present : Gunnar Fusi, SLP     Current Status/Progress Goal Weekly Team Focus  Bowel/Bladder   Pt is cont of B/B. LBM 08/08/20. Pt is on bowel program and q8 bladder scale.  Pt remain cont with regularity in B/B.  Toilet pt as needed and use Bowel program to aid in BM regularity   Swallow/Nutrition/ Hydration             ADL's   LB dressing seated EOB-min A for [employer]; UB dressing seated EOB-supervision; bathing-min A; SB transfers to DABSC-distant supervisin; toileting (use of urinal)-mod I  min A overall  discharge planning, education; tranfsers   Mobility   Supervison to mod I bed mobility, Supervision slide board transfers, mod I w/c mobility, +2 gait in // bars or dependently with LiteGait  Supervision overall, mod I w/c mobility  family edu, d/c planning, transfers, standing/gait as tolerated   Communication             Safety/Cognition/ Behavioral Observations            Pain   Pt complains of no pain  Pt remains pain free  Assess for pain qshift and  provide PRN medications per order   Skin   PT has closed incision to neck  Skin to remain free of injury and breakdown  Assess skin qshift and provide skin care.     Discharge Planning:  D/c to home with intermittent support from wife who will visit during the her lunch break; works 15 minutes away. Pt will need to be Mod I or intermittent support at discharge.   Team Discussion: Discharging tomorrow, will send to urology after discharge. Patient is on the bowel program, encourage patient to stand while voiding and to double void.  Patient on target to meet rehab goals: yes, patient is at goal level with therapy. Has a manual ultra-lite loaner W/C.  *See Care Plan and progress notes for long and short-term goals.   Revisions to Treatment Plan:  Medically ready for discharge.  Teaching Needs: All education complete.  Current Barriers to Discharge: Decreased caregiver support, Medical stability, Home enviroment access/layout, Neurogenic bowel and bladder, Lack of/limited family support, Weight bearing restrictions, Medication compliance, and Behavior  Possible Resolutions to Barriers: Continue current medications, provide emotional support.     Medical Summary Current Status: back pain better s/p prednisone-usually using tylenol, not tramadol now-  usually continent of bowel- but double void-  for bladder- no skin issues- bowel program daily  Barriers to Discharge: Decreased family/caregiver support;Home enviroment access/layout;Weight bearing restrictions;Neurogenic Bowel & Bladder  Barriers to Discharge Comments: surgeon needs to send therapy eval- Possible Resolutions to Barriers/Weekly Focus: famiyl education done- numotion loaner w/c- ultralight weight manual w/c- cost is a concern- at goal level-  d/c tomorrow   Continued Need for Acute Rehabilitation Level of Care: The patient requires daily medical management by a physician with specialized training in physical medicine and  rehabilitation for the following reasons: Direction of a multidisciplinary physical rehabilitation program to maximize functional independence : Yes Medical management of patient stability for increased activity during participation in an intensive rehabilitation regime.: Yes Analysis of laboratory values and/or radiology reports with any subsequent need for medication adjustment and/or medical intervention. : Yes   I attest that I was present, lead the team conference, and concur with the assessment and plan of the team.   Cristi Loron 08/09/2020, 5:50 PM

## 2020-08-09 NOTE — Progress Notes (Signed)
Bowel program started. Dig stim performed, no stool returned. Suppository inserted. Pt encouraged to call when ready. Handoff given to night nurse. Sheela Stack, LPN

## 2020-08-09 NOTE — Progress Notes (Signed)
Patient ID: Darren Harrison, male   DOB: May 07, 1955, 65 y.o.   MRN: [id number]  SW spoke with [place] for [facility] [phone] to f/u about referral for pt outpatient therapies. Reports referral was faxed on 6/23 to [facility].   SW met with pt in room to review discharge. Pt aware on above. Pt encouraged to follow-up with [facility] about referral if he has not received a phone call by next Wednesday. Pt is excited about going home. Pt wife to pick him up tomorrow. She is expected to be here around [place], MSW, [place] Office: [phone] Cell: [phone] Fax: [phone]

## 2020-08-09 NOTE — Progress Notes (Signed)
Occupational Therapy Discharge Summary  Patient Details  Name: Darren Harrison MRN: [id number] Date of Birth: [birth year]   Patient has met 10 of 12 long term goals due to improved activity tolerance, improved balance, and ability to compensate for deficits.  Pt made steady progress with BADLs and functional transfers during this admission. Pt requires min A for bathing at bed level or shower level. Dressing with min A for donning Ted hose at bed level and seated EOB. SB tranfsers with mod I to level surface. Toileting with mod I using urinal at w/c level. Squat pivot transfers with CGA to level surface. Education completed on importance of maintaining bowel program. Pt's wife has been present for therapy sessions. Pt pleased with progress and ready for discharge home tomorrow. Patient to discharge at [facility] Assist level.  Patient's care partner is independent to provide the necessary physical assistance at discharge.    Reasons goals not met: STS and standing balance goals not met as pt continues to require use of Stedy for standing.   Recommendation:  Patient will benefit from ongoing skilled OT services in outpatient setting to continue to advance functional skills in the area of BADL and iADL.  Equipment: No equipment provided Pt purchased wide BSC   Reasons for discharge: treatment goals met and discharge from hospital  Patient/family agrees with progress made and goals achieved: Yes  OT Discharge Vision Baseline Vision/History: Wears glasses Wears Glasses: At all times Patient Visual Report: No change from baseline Vision Assessment?: No apparent visual deficits Perception  Perception: Within Functional Limits Praxis Praxis: Intact Cognition Overall Cognitive Status: Within Functional Limits for tasks assessed Arousal/Alertness: Awake/alert Orientation Level: Oriented X4 Attention: Selective Focused Attention: Appears intact Selective Attention: Appears intact Memory:  Appears intact Awareness: Appears intact Problem Solving: Appears intact Safety/Judgment: Appears intact Sensation Sensation Light Touch: Impaired by gross assessment Proprioception: Impaired by gross assessment Stereognosis: Impaired by gross assessment Additional Comments: BUE and BLE impaired Coordination Gross Motor Movements are Fluid and Coordinated: No Fine Motor Movements are Fluid and Coordinated: No Finger Nose Finger Test: delayed Motor  Motor Motor: Abnormal tone;Abnormal postural alignment and control;Other (comment) Motor - Skilled Clinical Observations: limited d/t quadriparesis Mobility  Bed Mobility Bed Mobility: Rolling Right;Rolling Left;Supine to Sit;Sit to Supine Rolling Right: Independent with assistive device Rolling Left: Independent with assistive device Supine to Sit: Independent with assistive device Sit to Sidelying Left: Independent with assistive device Transfers Sit to Stand: Minimal Assistance - Patient > 75% (in // bars)  Trunk/Postural Assessment  Cervical Assessment Cervical Assessment: Exceptions to [facility] (cevical collar) Thoracic Assessment Thoracic Assessment: Exceptions to [facility] (rounded shoulders) Lumbar Assessment Lumbar Assessment: Exceptions to [facility] (posterior pelvic tilt) Postural Control Trunk Control: limited d/t quadriparesis  Balance Static Sitting Balance Static Sitting - Balance Support: Feet supported Static Sitting - Level of Assistance: 6: Modified independent (Device/Increase time) Dynamic Sitting Balance Dynamic Sitting - Balance Support: During functional activity Dynamic Sitting - Level of Assistance: 6: Modified independent (Device/Increase time) Extremity/Trunk Assessment RUE Assessment RUE Assessment: Exceptions to [facility] Active Range of Motion (AROM) Comments: WNL General Strength Comments: 4-/5 LUE Assessment Active Range of Motion (AROM) Comments: WNL General Strength Comments: 4-/5   [name] 08/09/2020, 6:58 AM

## 2020-08-09 NOTE — Progress Notes (Signed)
Physical Therapy Discharge Summary  Patient Details  Name: Darren Harrison MRN: [id number] Date of Birth: 01/07/1956  Today's Date: 08/09/2020  Patient has met 6 of 6 long term goals due to improved activity tolerance, improved balance, improved postural control, increased strength, decreased pain, and ability to compensate for deficits.  Patient to discharge at a wheelchair level Supervision to mod I.   Patient's care partner is independent to provide the necessary physical assistance at discharge. Patient's wife has completed hands-on family education and is safe to assist patient upon d/c home.  Reasons goals not met: Patient has met and/or exceeded all rehab goals.  Recommendation:  Patient will benefit from ongoing skilled PT services in outpatient setting to continue to advance safe functional mobility, address ongoing impairments in endurance, strength, ROM, balance, safety, independence with functional mobility, pain, and minimize fall risk.  Equipment: Patient provided with loaner ultralightweight manual wheelchair from [employer].   Reasons for discharge: treatment goals met and discharge from hospital  Patient/family agrees with progress made and goals achieved: Yes  PT Discharge Precautions/Restrictions Precautions Precautions: Cervical;Fall Precaution Comments: reviewed precautions Required Braces or Orthoses: Cervical Brace Cervical Brace: Hard collar;Other (comment) (when OOB) Restrictions Weight Bearing Restrictions: No Vision/Perception  Perception Perception: Within Functional Limits Praxis Praxis: Intact  Cognition Overall Cognitive Status: Within Functional Limits for tasks assessed Arousal/Alertness: Awake/alert Orientation Level: Oriented X4 Attention: Selective Focused Attention: Appears intact Selective Attention: Appears intact Memory: Appears intact Awareness: Appears intact Problem Solving: Appears intact Safety/Judgment: Appears  intact Sensation Sensation Light Touch: Impaired Detail Light Touch Impaired Details: Impaired RUE;Impaired LUE;Impaired RLE;Impaired LLE (impaired in hands > LE) Proprioception: Impaired Detail Proprioception Impaired Details: Impaired LLE;Impaired RLE Coordination Gross Motor Movements are Fluid and Coordinated: No Fine Motor Movements are Fluid and Coordinated: No Coordination and Movement Description: 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;Lateral/Scoot Transfers Sit to Stand: Minimal Assistance - Patient > 75% (in // bars) Squat Pivot Transfers: Contact Guard/Touching assist Lateral/Scoot Transfers: Independent with assistive device Transfer (Assistive device): Other (Comment) (slide board) Locomotion  Gait Ambulation: Yes Gait Assistance: Dependent - mechanical lift Gait Distance (Feet): 150 Feet Assistive device: Body weight support system Gait Gait: Yes Gait Pattern: Impaired Gait Pattern: Decreased step length - right;Decreased step length - left;Decreased weight shift to right;Decreased weight shift to left;Right flexed knee in stance;Left flexed knee in stance;Scissoring;Narrow base of support Gait velocity: decreased Stairs / Additional Locomotion Stairs: No Architect: Yes Wheelchair Assistance: Independent with Camera operator: Both upper extremities Wheelchair Parts Management: Independent Distance: 150  Trunk/Postural  Assessment  Cervical Assessment Cervical Assessment: Exceptions to [facility] (hard collar; cervical precautions) Thoracic Assessment Thoracic Assessment: Exceptions to [facility] (rounded shoulders) Lumbar Assessment Lumbar Assessment: Exceptions to [facility] (posterior pelvic tilt) Postural Control Postural Control: Deficits on evaluation Trunk Control: improved significantly since eval  Balance Balance Balance Assessed: Yes Static Sitting Balance Static Sitting - Balance Support: Feet supported Static Sitting - Level of Assistance: 6: Modified independent (Device/Increase time) Dynamic Sitting Balance Dynamic Sitting - Balance Support: During functional activity Dynamic Sitting - Level of Assistance: 6: Modified independent (Device/Increase time) Static Standing Balance  Static Standing - Balance Support: Bilateral upper extremity supported;During functional activity Static Standing - Level of Assistance: 4: Min assist Dynamic Standing Balance Dynamic Standing - Balance Support: Bilateral upper extremity supported;During functional activity Dynamic Standing - Level of Assistance: 3: Mod assist Extremity Assessment  RUE Assessment Active Range of Motion (AROM) Comments: WNL General Strength Comments: 4-/5 LUE Assessment Active Range of Motion (AROM) Comments: WNL General Strength Comments: 4-/5 RLE Assessment RLE Assessment: Exceptions to [facility] Passive Range of Motion (PROM) Comments: tight HS and DF Active Range of Motion (AROM) Comments: tight hip flexors, HS, heel cords General Strength Comments: impaired, see below RLE Strength RLE Overall Strength: Deficits Right Hip Flexion: 4/5 Right Knee Flexion: 3+/5 Right Knee Extension: 4/5 Right Ankle Dorsiflexion: 4/5 RLE Tone RLE Tone: Mild LLE Assessment LLE Assessment: Exceptions to [facility] Active Range of Motion (AROM) Comments: tight hip flexors, HS, heel cords General Strength Comments: impaired, see below LLE Strength LLE Overall  Strength: Deficits Left Hip Flexion: 3+/5 Left Knee Flexion: 4/5 Left Knee Extension: 4+/5 Left Ankle Dorsiflexion: 4/5 LLE Tone LLE Tone: Mild     [name], PT, DPT, CSRS 08/09/2020, 12:23 PM

## 2020-08-09 NOTE — Progress Notes (Signed)
No bowel movement noted since suppository was given on by day shift nurse. Patient refused any further dig stem, states he feels like its coming. Bowel sounds hyperactive in all four quadrants abdomen soft and non-distended.

## 2020-08-10 ENCOUNTER — Other Ambulatory Visit (HOSPITAL_COMMUNITY): Payer: Self-pay

## 2020-08-10 NOTE — Progress Notes (Signed)
Patient had a large  bowel movement

## 2020-08-10 NOTE — Progress Notes (Signed)
PROGRESS NOTE   Subjective/Complaints:   Voiding well- hasn't needed more caths- bowel program going well- ready for d/c today.  .  Also notes that Cards and PCP knew he wa sin Afib, but didn't discuss Anticoagulation with him- at all- explained want to send him home on Eliquis, but if for some reason not allowed to take by Liver doctor- if so, has to restart Lovenox for a total of 3 months form surgery. Reaffirmed pt needs to speak with liver physician about it.    ROS:   Pt denies SOB, abd pain, CP, N/V/C/D, and vision changes   Objective:   No results found. Recent Labs    08/08/20 0603  WBC 3.9*  HGB 11.7*  HCT 35.5*  PLT 156      Recent Labs    08/08/20 0603  NA 136  K 4.1  CL 107  CO2 23  GLUCOSE 85  BUN 14  CREATININE 0.72  CALCIUM 8.7*       Intake/Output Summary (Last 24 hours) at 08/10/2020 0846 Last data filed at 08/10/2020 0814 Gross per 24 hour  Intake 580 ml  Output 975 ml  Net -395 ml         Physical Exam: Vital Signs Blood pressure 116/89, pulse 73, temperature 97.9 F (36.6 C), temperature source Oral, resp. rate 17, height 5\' 10"  (1.778 m), weight 74.8 kg, SpO2 99 %.      General: awake, alert, appropriate, sitting up in bed- no collar right now; NAD HENT: conjugate gaze; oropharynx moist CV: regular rate; no JVD Pulmonary: CTA B/L; no W/R/R- good air movement GI: soft, NT, ND, (+)BS Psychiatric: appropriate/interactive Neurological: Ox3  Musculoskeletal:still TTP R low paraspinals-- much less TTP Bilateral knee varus deformity ;    General: TTP over R shoulder- over biceps tendon as well as R GH joint and AC joint still TTP; crepitus with ROM     Comments: RUE- biceps 3/5, WE 4-/5, triceps 3/5, grip 3+/5, and FA 1/5- developing muscle atrophy in RUE as well as in teres and upper traps on R>L LUE- all muscles 4+/5 RLE- HF 3-/5, KE 3+/5, DF 3-/5, and PF 3-/5, LLE-  HF 4+/5, KE 5-/5, DF 4/5 and PF 3-/5;  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 Still having a few spasms/extensor tone-  Sensation: RUE C4 ok- decreased from C5 to S1 on R LUE- C5 OK- decreased from C6 to S1 on L    Assessment/Plan: 1. Functional deficits which require 3+ hours per day of interdisciplinary therapy in a comprehensive inpatient rehab setting. Physiatrist is providing close team supervision and 24 hour management of active medical problems listed below. Physiatrist and rehab team continue to assess barriers to discharge/monitor patient  progress toward functional and medical goals  Care Tool:  Bathing  Bathing activity did not occur: Refused Body parts bathed by patient: Right arm, Left arm, Chest, Abdomen, Front perineal area, Right upper leg, Left upper leg, Right lower leg, Left lower leg   Body parts bathed by helper: Buttocks     Bathing assist Assist Level: Minimal Assistance - Patient > 75%     Upper Body Dressing/Undressing Upper body dressing   What is the patient wearing?: Pull over shirt, Orthosis    Upper body assist Assist Level: Minimal Assistance - Patient > 75%    Lower Body Dressing/Undressing Lower body dressing      What is the patient wearing?: Pants, Incontinence brief     Lower body assist Assist for lower body dressing: Minimal Assistance - Patient > 75%     Toileting Toileting    Toileting assist Assist for toileting: Independent with assistive device     Transfers Chair/bed transfer  Transfers assist     Chair/bed  transfer assist level: Independent with assistive device Chair/bed transfer assistive device: Sliding board   Locomotion Ambulation   Ambulation assist   Ambulation activity did not occur: Safety/medical concerns  Assist level: Dependent - Patient 0% Assistive device: Lite Gait Max distance: 150'   Walk 10 feet activity   Assist  Walk 10 feet activity did not occur: Safety/medical concerns  Assist level: Dependent - Patient 0% Assistive device: Lite Gait   Walk 50 feet activity   Assist Walk 50 feet with 2 turns activity did not occur: Safety/medical concerns  Assist level: Dependent - Patient 0% Assistive device: Lite Gait    Walk 150 feet activity   Assist Walk 150 feet activity did not occur: Safety/medical concerns  Assist level: Dependent - Patient 0% Assistive device: Lite Gait    Walk 10 feet on uneven surface  activity   Assist Walk 10 feet on uneven surfaces activity did not occur: Safety/medical concerns         Wheelchair     Assist Will patient use wheelchair at discharge?: Yes Type of Wheelchair: Manual    Wheelchair assist level: Independent Max wheelchair distance: 150'    Wheelchair 50 feet with 2 turns activity    Assist        Assist Level: Independent   Wheelchair 150 feet activity     Assist      Assist Level: Independent   Blood pressure 116/89, pulse 73, temperature 97.9 F (36.6 C), temperature source Oral, resp. rate 17, height 5\' 10"  (1.778 m), weight 74.8 kg, SpO2 99 %.  Medical Problem List and Plan: 1. 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  Status post C3-4 laminectomy with posterior arthrodesis cervical instrumentation 06/29/2020.  Cervical brace as directed.             -patient may  Shower with neck brace/incision covered             -ELOS/Goals: ~3 weeks- min A- -will order PRAFOs and have pt wear nightly to prevent ankle contractures  5/26- con't PT  and OT- spent 20 minutes going over ASIA level, and info about SCI including neurogenic bowel and bladder, spasticity and increased risk of DVT/pressure ulcers.   --con't PT and OT- discussed at length with pt goal is Min A- if so, will need 24/7 A at home- I don't think he'll get to Mod I (explained this)- made agreement if pt does, will reward with singing!  But think this is unlikely- I would think about short term SNF-   6/23- trying to keep til next Wednesday/Thursday  since we truly feel this will get pt home by then- con't PT and OT   Continue PT and OT- working on therapeutic intervention for low back since limiting therapy. Continue prednisone.   Con't PT and OT- prednisone is done as of yesterday- d/c Wednesday 6/29- firm d/c.   -con't PT and OT- family teaching- d/c tomorrow  6/29- d/c today! Home! 2.  Impaired mobility -DVT/anticoagulation:  Vascular study reviewed and negative.  5/26- will need for at least 2 months- maybe 3 months total from surgery. 6/3- will need for a total of 3 months  6/22- will switch to Eliquis due to Afib and stop lovenox. Patient says he was not started on oral AC for afib prior-  6/27- will let PCP determine if continues for Afib? Will check if needs to put back on Lovenox  6/28- if Liver physician doesn't want him on Eliquis, he would need to restart Lovenox 40 mg daily x 3 months from surgery date- 5/17- so until mid August- Asked pt to ask PCP and liver doctor- will con't Eliquis for now.   6/29- check with PCP/liver doc about Xarelto! Since insurance didn't cover Eliquis, only Xarelto 3. Lower back spasms/R shoulder pain: Continue Baclofen 5 mg 3 times daily, oxycodone and Flexeril as needed  5/26- pain pretty well controlled- con't regimen  5/28-   con't regimen prn  5/31- will do R shoulder injection today.  6/3- Shoulder injection 5/31- R shoulder pain almost gone with injection   6/7- Added tramadol- got rid of 10 mg Oxy-   6/10- taking tramadol-  works for R >L knee, but shoulder now acting up again- will get xray and go from there- con't regimen  6/23- taking tramadol prn- back pain more this AM- 6/10- con't regimen prn  6/24- will try Prednisone 20 mg daily x 4 days and Flexeril add 10 mg TID prn.   6/28- pain controlled s/p prednisone and with prn tramadol- con't regimen 4. Mood: Provide emotional support  6/15- pt insisting to d/c Celexa- will discontinue.              -antipsychotic agents: N/A 5. Neuropsych: This patient is capable of making decisions on his own behalf. 6. Skin/Wound Care: Routine skin checks 7. Fluids/Electrolytes/Nutrition: Routine in and outs with follow-up chemistries 8.  Atrial fibrillation.  Lopressor 25 mg twice daily.  Cardiac rate controlled  5/31- will decrease Metoprolol to 12.5 mg daily (was qday) since heart rate on low side and BP low- don't want him orthostatic due to SCI.  6/20- Bradycardia better today- con't off B blocker.   6/22- will change to Eliquis from Lovenox -since back in Afib.   6/23- HR good this AM- con't regimen  6/24- HR well cntrolled- con't Eliquis- not on BP /rate lowering meds 6/28- HR 72 this AM- and >60 in last 24 hours- con't regimen inc luding Eliquis 9.  Hypertension with orthostatic hypotension.  Cozaar 50 mg daily.  Monitor with increased mobility  5/28 elevated today.  5/30- BP 90s/50s- will monitor for orthostatic hypotension  5/31- will decrease metoprolol as above  6/8- per pt , no dizziness, however BP low- will stop Losartan and con't metoprolol since for rate control.   6/10- BP soft again 90/s50s- off losartan- only on Metoprolol lowest dose- will call Cards next week if doesn't improve.   6/12- HR  67- con't regimen Vitals:   08/09/20 1939 08/10/20 0549  BP: 91/74 116/89  Pulse: 84 73  Resp: 18 17  Temp: 97.7 F (36.5 C) 97.9 F (36.6 C)  SpO2: 97% 99%   6/22- Heart rate in 60s this AM- will start Eliquis since back in Afib.  10.  BPH/UTI.  Was on  Flomax 0.4 mg daily.   UTI greater than 80,000 Staphylococcus/Enterococcus completing course of Bactrim- please see neurogenic bladder-  11.  Surgical PCR screening positive.  Bactroban as directed 12. Hyperthyroidism- had lost 70 lbs in 6 months- in middle of treatment  6/1- per Endo, want labs checked before appointment 6/7- will order thyroid labs for tomorrow, so they are back for appointment.   6/2- TSH <0.010 and free T4 1.03- free T3 pending.   6/3- pt to have endo phone appt 6/9- asked him to let therapy know to schedule therapy around it.   6/7- has appt today per pt- will let me know if there's any changes!  6/8- allowed to stop Prednisone and will wait on Synthroid for today.   6/14- stopping Protonix since pt doesn't need- was since previous Prednisone dose that's stopped.  13. Fatty liver-will need to be careful with any medicine going through liver. Placed nursing order to request that protein shake recommended by his hepatologist be given at night  6/7- explained to pt that tylenol can affect liver, not the other way around. 6/28- will con't Eliquis for now- but pt to ask liver doctor if needs to go back on Lovenox or con't Eliquis.   14. Neurogenic bladder- will start on I/o caths q6 hours prn and increase Flomax to 0.8 mg with supper  6/7- will change order to cath if volumes >250cc  6/10- will call Urology for advise- is usually requiring caths still- but voiding some- don't want pt to get Foley (if goes to SNF).  6/16- no cath required since Monday- will might not need to go home with caths.   6/20- no caths for 7 days- but room odor of urine?will ask nursing/therapy.  6/21- will check U/A and Cx since room smells of urine and having more incontinence which can be due to bladder spasms, which can come with UTI? 6/22- has UTI- waiting for Cx.  6/23-6/26 urine Cx being re-incubated-will continue Bactrim and monitor- room doesn't smell of urine anymore/today  6/28- refused cath  yesterday AM- really needs to double void if need be- will change bladder scans to q8 hours   6/29- won't go home with cath supplies- advised that if retains, increases risk of UTI 15.  Neurogenic bowel- doesn't have good control- will start with suppository- and dig stim and see if can withdraw any of them over time.   6/2- Pt having good results with bowel program- still requiring some dig stim, but less.   6/5: add miralax mixed in prune juice to dinner.   6/6- BM on toilet with dig stim/supp- improving!- con't regimen  6/25-6/26: continue bowel program on toilet- working well  6/28- didn't do bowel program last night- has good BM on toilet- if this continues x1 week, don't need to do bowel program 16. Spasticity- will increase Baclofen to 10 mg TID and con't Flexeril.   Continue flexeril HS  5/31- will d/w pt going up on baclofen cannot do zanaflex due to low BP and cannot do dantrolene due to liver issues.  6/14- spasms getting a little worse- wait to increase Baclofen since don't have much  more to go up without side effects.   17. Orthostatic hypotension  5/26- added TEDs and Abd binder during day.  5/31- decreased metoprolol   6/8- will stop Losartan 18. Ileus     Resolved tolerates Regular diet 19. R shoulder pain  5/30- will try to do R shoulder injection tomorrow 5/31- R shoulder injections for subacromial and RTC/posterior shoulder injection today  6/2-6/5 pain MUCH better- continue regimen 5/31-received steroid injection.   6/10- will get shoulder xray as above- and if need be, MRI of R shoulder since pain has come back.   6/11- xray shows likely RTC injury and significant DJD of R shoulder- GH and AC- will start Voltaren gel QID.  20. Nausea: resolved: changed valium and reglan to PRN 21. R>L knee pain  6/7- added tramadol 50 mg q6 hours prn and stopped Oxycodone 10 mg- take oxy only if severe pain; try Tramadol, not tlyenol when hurting BEFORE therapy.  6/10- doing well  with tramadol c-on't regimen 22. Back pain  6/16- has trigger point - will try therapy- if not, will try trigger point injection  6/20- will see if needs to injections- 50% better- will see tomorrow 6/18 some relief with flexeril  6/22- pain down to 3-4/10 - will con't regimen  6/27- down to 1-3/10 all the time since prednisone- will con't off prednisone.  65. Dispo  6/8- spent a long time going over that we, as a team, think he would benefit from more time at SNF- for 1-2 months, not long term- OT offered to speak with pt about this.   6/11- d/w pt and wife who were in room- will see how he does this next week.   6/14- team conference today to discuss progress.   6/15- d/c 6/22- for now, but will see how doing next week  6/17- set up insurance to cover until 6/22- filled out handicapped placard.   6/24- d/c date set for 6/29- firm d/c date.   6/28- d/c tomorrow- has appt for outpt Neuro[facility] for PT and OT  6/29- d/c today! 24.  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

## 2020-08-10 NOTE — Progress Notes (Addendum)
Discharge instructions discussed with Darren Harrison by PA Linna Hoff. Darren Harrison/family in agreement. Belongings gathered. Darren Harrison left per wheelchair to private vehicle. No complications noted. Discussed with Darren Harrison bowel program, Darren Harrison wife verblize understanding Sheela Stack, LPN

## 2020-08-10 NOTE — Progress Notes (Signed)
Inpatient Rehabilitation Care Coordinator Discharge Note  The overall goal for the admission was met for:   Discharge location: Yes. D/c to home with intermittent support from his wife.   Length of Stay: Yes. 35 days.   Discharge activity level: Yes. Supervision to Mod I at w/c level; Min A for bathing at bed level or shower level.  Home/community participation: Yes. Limited.   Services provided included: MD, RD, PT, OT, RN, CM, TR, Pharmacy, Neuropsych, and SW  Financial Services: Private Insurance: [employer] offered to/list presented to:Yes  Follow-up services arranged: Outpatient: [facility] for PT/OT (Referral sent by Dr. Adline Mango office) and DME: specialty w/c through [employer]  Comments (or additional information):  Patient/Family verbalized understanding of follow-up arrangements: Yes  Individual responsible for coordination of the follow-up plan: contact pt # [phone]  Confirmed correct DME delivered: Rana Snare 08/10/2020    Rana Snare

## 2020-08-16 ENCOUNTER — Other Ambulatory Visit (HOSPITAL_COMMUNITY): Payer: Self-pay

## 2020-08-16 ENCOUNTER — Telehealth (HOSPITAL_COMMUNITY): Payer: Self-pay

## 2020-08-16 NOTE — Telephone Encounter (Signed)
Pharmacy Transitions of Care Follow-up Telephone Call  Date of discharge: 08/09/20  Discharge Diagnosis: cervical myopathy  How have you been since you were released from the hospital?  Patient has been doing well. Understands med changes  Medication changes made at discharge: START taking: bisacodyl (DULCOLAX)  diclofenac Sodium (VOLTAREN)  lidocaine (LIDODERM)  polyethylene glycol (MIRALAX / GLYCOLAX)  senna (SENOKOT)  traMADol (ULTRAM)  Xarelto (rivaroxaban)  Zinc Sulfate    CHANGE how you take: acetaminophen (TYLENOL)  Baclofen  cyclobenzaprine (FLEXERIL)  docusate sodium (COLACE)  tamsulosin (FLOMAX)   STOP taking: losartan 50 MG tablet (COZAAR)  nitrofurantoin (macrocrystal-monohydrate) 100 MG capsule (MACROBID)  oxyCODONE 5 MG immediate release tablet (Oxy IR/ROXICODONE)  Zinc 50 MG Caps  Replaced by a similar medication.  Medication changes verified by the patient? Yes    Medication Accessibility:  Home Pharmacy:  [facility]  Was the patient provided with refills on discharged medications? Yes   Have all prescriptions been transferred from [facility] to home pharmacy?  Yes  Is the patient able to afford medications? Yes - has [facility]    Medication Review:  RIVAROXABAN (XARELTO)  Rivaroxaban 20 mg QD initiated on 08/09/20.  - Discussed importance of taking medication with food and around the same time everyday  - Advised patient of medications to avoid (NSAIDs, ASA)  - Educated that Tylenol (acetaminophen) will be the preferred analgesic to prevent risk of bleeding  - Emphasized importance of monitoring for signs and symptoms of bleeding (abnormal bruising, prolonged bleeding, nose bleeds, bleeding from gums, discolored urine, black tarry stools)  - Advised patient to alert all providers of anticoagulation therapy prior to starting a new medication or having a procedure   Follow-up Appointments:  [facility] f/u appt confirmed?  Endocrinology on 08/23/20 with Dr. Marinus Maw and Physical medicine with Dr. Dagoberto Ligas on 08/26/20  If their condition worsens, is the pt aware to call PCP or go to the Emergency Dept.? yes  Final Patient Assessment: Patient doing well. Has refills at home pharmacy and follow up scheduled.

## 2020-08-18 LAB — T4, FREE: Free T4: 2.03 ng/dL — ABNORMAL HIGH (ref 0.82–1.77)

## 2020-08-18 LAB — TSH: TSH: <0.005 u[IU]/mL — ABNORMAL LOW (ref 0.450–4.500)

## 2020-08-18 LAB — T3, FREE: T3, Free: 6.7 pg/mL — ABNORMAL HIGH (ref 2.0–4.4)

## 2020-08-23 ENCOUNTER — Encounter: Payer: Self-pay | Admitting: Nurse Practitioner

## 2020-08-23 ENCOUNTER — Ambulatory Visit (INDEPENDENT_AMBULATORY_CARE_PROVIDER_SITE_OTHER): Payer: 59 | Admitting: Nurse Practitioner

## 2020-08-23 VITALS — BP 117/78 | HR 55 | Ht 70.0 in

## 2020-08-23 DIAGNOSIS — E0500 Thyrotoxicosis with diffuse goiter without thyrotoxic crisis or storm: Secondary | ICD-10-CM

## 2020-08-23 DIAGNOSIS — E0590 Thyrotoxicosis, unspecified without thyrotoxic crisis or storm: Secondary | ICD-10-CM

## 2020-08-23 NOTE — Patient Instructions (Signed)
Hyperthyroidism  Hyperthyroidism is when the thyroid gland is too active (overactive). The thyroid gland is a small gland located in the lower front part of the neck, just in front of the windpipe (trachea). This gland makes hormones that help control how the body uses food for energy (metabolism) as well as how the heart and brain function. These hormones also play a role in keeping your bones strong. When the thyroid is overactive, it produces toomuch of a hormone called thyroxine. What are the causes? This condition may be caused by: Graves' disease. This is a disorder in which the body's disease-fighting system (immune system) attacks the thyroid gland. This is the most common cause. Inflammation of the thyroid gland. A tumor in the thyroid gland. Use of certain medicines, including: Prescription thyroid hormone replacement. Herbal supplements that mimic thyroid hormones. Amiodarone therapy. Solid or fluid-filled lumps within your thyroid gland (thyroid nodules). Taking in a large amount of iodine from foods or medicines. What increases the risk? You are more likely to develop this condition if: You are male. You have a family history of thyroid conditions. You smoke tobacco. You use a medicine called lithium. You take medicines that affect the immune system (immunosuppressants). What are the signs or symptoms? Symptoms of this condition include: Nervousness. Inability to tolerate heat. Unexplained weight loss. Diarrhea. Change in the texture of hair or skin. Heart skipping beats or making extra beats. Rapid heart rate. Loss of menstruation. Shaky hands. Fatigue. Restlessness. Sleep problems. Enlarged thyroid gland or a lump in the thyroid (nodule). You may also have symptoms of Graves' disease, which may include: Protruding eyes. Dry eyes. Red or swollen eyes. Problems with vision. How is this diagnosed? This condition may be diagnosed based on: Your symptoms and  medical history. A physical exam. Blood tests. Thyroid ultrasound. This test involves using sound waves to produce images of the thyroid gland. A thyroid scan. A radioactive substance is injected into a vein, and images show how much iodine is present in the thyroid. Radioactive iodine uptake test (RAIU). A small amount of radioactive iodine is given by mouth to see how much iodine the thyroid absorbs after a certain amount of time. How is this treated? Treatment depends on the cause and severity of the condition. Treatment may include: Medicines to reduce the amount of thyroid hormone your body makes. Radioactive iodine treatment (radioiodine therapy). This involves swallowing a small dose of radioactive iodine, in capsule or liquid form, to kill thyroid cells. Surgery to remove part or all of your thyroid gland. You may need to take thyroid hormone replacement medicine for the rest of your life after thyroid surgery. Medicines to help manage your symptoms. Follow these instructions at home:  Take over-the-counter and prescription medicines only as told by your health care provider. Do not use any products that contain nicotine or tobacco, such as cigarettes and e-cigarettes. If you need help quitting, ask your health care provider. Follow any instructions from your health care provider about diet. You may be instructed to limit foods that contain iodine. Keep all follow-up visits as told by your health care provider. This is important. You will need to have blood tests regularly so that your health care provider can monitor your condition. Contact a health care provider if: Your symptoms do not get better with treatment. You have a fever. You are taking thyroid hormone replacement medicine and you: Have symptoms of depression. Feel like you are tired all the time. Gain weight. Get help right  away if: You have chest pain. You have decreased alertness or a change in your awareness. You  have abdominal pain. You feel dizzy. You have a rapid heartbeat. You have an irregular heartbeat. You have difficulty breathing. Summary The thyroid gland is a small gland located in the lower front part of the neck, just in front of the windpipe (trachea). Hyperthyroidism is when the thyroid gland is too active (overactive) and produces too much of a hormone called thyroxine. The most common cause is Graves' disease, a disorder in which your immune system attacks the thyroid gland. Hyperthyroidism can cause various symptoms, such as unexplained weight loss, nervousness, inability to tolerate heat, or changes in your heartbeat. Treatment may include medicine to reduce the amount of thyroid hormone your body makes, radioiodine therapy, surgery, or medicines to manage symptoms. This information is not intended to replace advice given to you by your health care provider. Make sure you discuss any questions you have with your healthcare provider. Document Revised: 10/15/2019 Document Reviewed: 10/15/2019 Elsevier Patient Education  2022 [employer].

## 2020-08-23 NOTE — Progress Notes (Signed)
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 SENSITIVE Sensitive     AMPICILLIN/SULBACTAM <=2 SENSITIVE Sensitive     PIP/TAZO <=4 SENSITIVE Sensitive     * >=100,000 COLONIES/mL ESCHERICHIA COLI  CBC with Differential/Platelet     Status: Abnormal   Collection Time: 08/01/20  5:43 AM  Result Value Ref Range   WBC 3.2 (L) 4.0 - 10.5 K/uL   RBC 3.60 (L) 4.22 - 5.81 MIL/uL   Hemoglobin 10.9 (L) 13.0 - 17.0 g/dL   HCT 33.3 (L) 39.0 - 52.0 %   MCV 92.5 80.0 - 100.0 fL   MCH  30.3 26.0 - 34.0 pg   MCHC 32.7 30.0 - 36.0 g/dL   RDW 16.3 (H) 11.5 - 15.5 %   Platelets 132 (L) 150 - 400 K/uL   nRBC 0.0 0.0 - 0.2 %   Neutrophils Relative % 40 %   Neutro Abs 1.3 (L) 1.7 - 7.7 K/uL   Lymphocytes Relative 48 %   Lymphs Abs 1.6 0.7 - 4.0 K/uL   Monocytes Relative 6 %   Monocytes Absolute 0.2 0.1 - 1.0 K/uL   Eosinophils Relative 5 %   Eosinophils Absolute 0.2 0.0 - 0.5 K/uL   Basophils Relative 1 %   Basophils Absolute 0.0 0.0 - 0.1 K/uL   Immature Granulocytes 0 %   Abs Immature Granulocytes 0.00 0.00 - 0.07 K/uL    Comment: Performed at [facility], [place] [address], [place],  [address]  Comprehensive metabolic panel     Status: Abnormal   Collection Time: 08/01/20  5:43 AM  Result Value Ref Range   Sodium 138 135 - 145 mmol/L   Potassium 4.1 3.5 - 5.1 mmol/L   Chloride 103 98 - 111 mmol/L   CO2 27 22 - 32 mmol/L   Glucose, Bld 88 70 - 99 mg/dL    Comment: Glucose reference range applies only to samples taken after fasting for at least 8 hours.   BUN 13 8 - 23 mg/dL   Creatinine, Ser 0.67 0.61 - 1.24 mg/dL   Calcium 8.9 8.9 - 10.3 mg/dL   Total Protein 5.3 (L) 6.5 - 8.1 g/dL   Albumin 2.7 (L) 3.5 - 5.0 g/dL   AST 20 15 - 41 U/L   ALT 18 0 - 44 U/L   Alkaline Phosphatase 148 (H) 38 - 126 U/L   Total Bilirubin 0.9 0.3 - 1.2 mg/dL  GFR, Estimated >60 >60 mL/min    Comment: (NOTE) Calculated using the CKD-EPI Creatinine Equation (2021)    Anion gap 8 5 - 15    Comment: Performed at [facility], [place] [address] [place], [place] [address]  Urinalysis, Routine w reflex microscopic     Status: Abnormal   Collection Time: 08/02/20  8:27 AM  Result Value Ref Range   Color, Urine YELLOW YELLOW   APPearance HAZY (A) CLEAR   Specific Gravity, Urine 1.005 1.005 - 1.030   pH 7.0 5.0 - 8.0   Glucose, UA NEGATIVE NEGATIVE mg/dL   Hgb urine dipstick NEGATIVE NEGATIVE   Bilirubin Urine NEGATIVE NEGATIVE   Ketones, ur NEGATIVE NEGATIVE mg/dL    Protein, ur NEGATIVE NEGATIVE mg/dL   Nitrite POSITIVE (A) NEGATIVE   Leukocytes,Ua LARGE (A) NEGATIVE   RBC / HPF 0-5 0 - 5 RBC/hpf   WBC, UA 21-50 0 - 5 WBC/hpf   Bacteria, UA MANY (A) NONE SEEN   Squamous Epithelial / LPF 0-5 0 - 5    Comment: Performed at [facility], [address] [place], [place] [address]  Culture, Urine     Status: Abnormal   Collection Time: 08/02/20  9:42 AM   Specimen: Urine, Random  Result Value Ref Range   Specimen Description URINE, RANDOM    Special Requests NONE    Culture (A)     >=100,000 COLONIES/mL ESCHERICHIA COLI 60,000 COLONIES/mL AEROCOCCUS SPECIES Standardized susceptibility testing for this organism is not available. Performed at [facility], [place] [address], [place], [place] [address]    Report Status 08/06/2020 FINAL    Organism ID, Bacteria ESCHERICHIA COLI (A)       Susceptibility   Escherichia coli - MIC*    AMPICILLIN 4 SENSITIVE Sensitive     CEFAZOLIN <=4 SENSITIVE Sensitive     CEFEPIME <=0.12 SENSITIVE Sensitive     CEFTRIAXONE <=0.25 SENSITIVE Sensitive     CIPROFLOXACIN <=0.25 SENSITIVE Sensitive     GENTAMICIN <=1 SENSITIVE Sensitive     IMIPENEM <=0.25 SENSITIVE Sensitive     NITROFURANTOIN <=16 SENSITIVE Sensitive     TRIMETH/SULFA <=20 SENSITIVE Sensitive     AMPICILLIN/SULBACTAM <=2 SENSITIVE Sensitive     PIP/TAZO <=4 SENSITIVE Sensitive     * >=100,000 COLONIES/mL ESCHERICHIA COLI  CBC with Differential/Platelet     Status: Abnormal   Collection Time: 08/08/20  6:03 AM  Result Value Ref Range   WBC 3.9 (L) 4.0 - 10.5 K/uL   RBC 3.82 (L) 4.22 - 5.81 MIL/uL   Hemoglobin 11.7 (L) 13.0 - 17.0 g/dL   HCT 35.5 (L) 39.0 - 52.0 %   MCV 92.9 80.0 - 100.0 fL   MCH 30.6 26.0 - 34.0 pg   MCHC 33.0 30.0 - 36.0 g/dL   RDW 15.9 (H) 11.5 - 15.5 %   Platelets 156 150 - 400 K/uL   nRBC 0.0 0.0 - 0.2 %   Neutrophils Relative % 46 %   Neutro Abs 1.8 1.7 - 7.7 K/uL   Lymphocytes Relative 45 %   Lymphs Abs 1.8  0.7 - 4.0 K/uL   Monocytes Relative 6 %   Monocytes Absolute 0.3 0.1 - 1.0 K/uL   Eosinophils Relative 2 %   Eosinophils Absolute 0.1 0.0 - 0.5 K/uL   Basophils Relative 1 %   Basophils Absolute 0.0 0.0 - 0.1 K/uL   Immature Granulocytes 0 %   Abs Immature Granulocytes 0.00 0.00 - 0.07  K/uL    Comment: Performed at [facility], [place] [address] [place], [place] [address]  Comprehensive metabolic panel     Status: Abnormal   Collection Time: 08/08/20  6:03 AM  Result Value Ref Range   Sodium 136 135 - 145 mmol/L   Potassium 4.1 3.5 - 5.1 mmol/L   Chloride 107 98 - 111 mmol/L   CO2 23 22 - 32 mmol/L   Glucose, Bld 85 70 - 99 mg/dL    Comment: Glucose reference range applies only to samples taken after fasting for at least 8 hours.   BUN 14 8 - 23 mg/dL   Creatinine, Ser 0.72 0.61 - 1.24 mg/dL   Calcium 8.7 (L) 8.9 - 10.3 mg/dL   Total Protein 5.7 (L) 6.5 - 8.1 g/dL   Albumin 3.1 (L) 3.5 - 5.0 g/dL   AST 25 15 - 41 U/L   ALT 19 0 - 44 U/L   Alkaline Phosphatase 155 (H) 38 - 126 U/L   Total Bilirubin 0.5 0.3 - 1.2 mg/dL   GFR, Estimated >60 >60 mL/min    Comment: (NOTE) Calculated using the CKD-EPI Creatinine Equation (2021)    Anion gap 6 5 - 15    Comment: Performed at [facility], [place] [address] [place], [place] [address]  TSH     Status: Abnormal   Collection Time: 08/17/20  3:43 PM  Result Value Ref Range   TSH <0.005 (L) 0.450 - 4.500 uIU/mL  T4, free     Status: Abnormal   Collection Time: 08/17/20  3:43 PM  Result Value Ref Range   Free T4 2.03 (H) 0.82 - 1.77 ng/dL  T3, free     Status: Abnormal   Collection Time: 08/17/20  3:43 PM  Result Value Ref Range   T3, Free 6.7 (H) 2.0 - 4.4 pg/mL    Thyroid uptake and scan on April 12, 2020 FINDINGS: Homogeneous tracer distribution in both thyroid lobes.  Tiny photopenic defect laterally mid RIGHT lobe.   No additional areas of increased or decreased tracer localization.   4 hour I-123 uptake = 70.6%  (normal 5-20%),  24 hour I-123 uptake = 76.7% (normal 10-30%)   IMPRESSION: Markedly elevated 4 hour and 24 hour radio iodine uptakes consistent with hyperthyroidism.  Overall findings most consistent with Graves disease.   Results for SOPHIE, QUILES "[name]" (MRN [id number]) as of 08/23/2020 15:56  Ref. Range 08/17/2020 15:43  TSH Latest Ref Range: 0.450 - 4.500 uIU/mL <0.005 (L)  Triiodothyronine,Free,Serum Latest Ref Range: 2.0 - 4.4 pg/mL 6.7 (H)  T4,Free(Direct) Latest Ref Range: 0.82 - 1.77 ng/dL 2.03 (H)    Assessment & Plan:   1.  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  However, his recent thyroid function tests are consistent with over-active thyroid which could be a result of RAI treatment failure.  I recommend repeating uptake and scan with follow up in 4 weeks to discuss findings.  If his uptake and scan is consistent with increased thyroid activity, he will be considered for repeating RAI ablation.        -Patient is advised to maintain close follow up with [name], FNP for primary care needs.     I spent 22 minutes in the care of the patient today including review of labs from Thyroid Function, CMP, and other relevant labs ; imaging/biopsy records (current and previous  including abstractions from other facilities); face-to-face time discussing  his lab results and symptoms, medications doses, his options of short and long term treatment based on the latest standards of care / guidelines;   and documenting the encounter.  Nicholes Stairs  participated in the discussions, expressed understanding, and voiced agreement with the above plans.  All questions were answered to his satisfaction. he is encouraged to contact clinic should he have any questions or concerns prior to his return visit.   Follow up plan: Return in about 4 weeks (around 09/20/2020) for  Thyroid follow up; uptake and scan.   Thank you for involving me in the care of this pleasant patient, and I will continue to update you with his progress.  Rayetta Pigg, [facility] [address] [place], [place] [address] Phone: [phone] Fax: [phone]  08/23/2020, 1:56 PM

## 2020-08-26 ENCOUNTER — Other Ambulatory Visit: Payer: Self-pay

## 2020-08-26 ENCOUNTER — Encounter: Payer: Self-pay | Admitting: Physical Medicine and Rehabilitation

## 2020-08-26 ENCOUNTER — Encounter
Payer: Managed Care, Other (non HMO) | Attending: Physical Medicine and Rehabilitation | Admitting: Physical Medicine and Rehabilitation

## 2020-08-26 VITALS — BP 121/68 | HR 81 | Temp 97.9°F | Ht 70.0 in | Wt 165.0 lb

## 2020-08-26 DIAGNOSIS — R252 Cramp and spasm: Secondary | ICD-10-CM | POA: Diagnosis present

## 2020-08-26 DIAGNOSIS — G8250 Quadriplegia, unspecified: Secondary | ICD-10-CM

## 2020-08-26 DIAGNOSIS — Z993 Dependence on wheelchair: Secondary | ICD-10-CM

## 2020-08-26 DIAGNOSIS — G959 Disease of spinal cord, unspecified: Secondary | ICD-10-CM | POA: Diagnosis present

## 2020-08-26 DIAGNOSIS — K592 Neurogenic bowel, not elsewhere classified: Secondary | ICD-10-CM

## 2020-08-26 DIAGNOSIS — N319 Neuromuscular dysfunction of bladder, unspecified: Secondary | ICD-10-CM | POA: Diagnosis not present

## 2020-08-26 MED ORDER — BACLOFEN 5 MG PO TABS: 15.0000 mg | ORAL_TABLET | Freq: Three times a day (TID) | ORAL | 3 refills | Status: DC

## 2020-08-26 MED ORDER — CYCLOBENZAPRINE HCL 10 MG PO TABS: 10.0000 mg | ORAL_TABLET | Freq: Every day | ORAL | 5 refills | Status: DC

## 2020-08-26 MED ORDER — LIDOCAINE 5 % EX PTCH: 1.0000 | MEDICATED_PATCH | CUTANEOUS | 5 refills | Status: DC

## 2020-08-26 MED ORDER — TRAMADOL HCL 50 MG PO TABS: 100.0000 mg | ORAL_TABLET | Freq: Two times a day (BID) | ORAL | 3 refills | Status: DC | PRN

## 2020-08-26 MED ORDER — TAMSULOSIN HCL 0.4 MG PO CAPS: 0.8000 mg | ORAL_CAPSULE | Freq: Every day | ORAL | 5 refills | Status: DC
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
## 2020-08-26 NOTE — Patient Instructions (Signed)
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 Also has has BPH and Afib.    Colonization vs UTI of bladder-  Urinalysis will always look (+)- Dark urine, smelly, and don't mean UTI- a lot of SCI patients- can have bladder spasms/incontinent; spasticity abruptly get worse- Might get fever, feel bad, etc.  Drink more- when urine dark, odor stronger.   2. Really need to drink 2 liters/day  3. Can reduce Miralax to every other day. If need be, but don't fix it if "ain't broke".   4. Can stop TED hose if no swelling and BP good.   5. Collar per surgeon.   6. Call Ortho and get in with them- for R shoulder pain.   7. Will Renew Baclofen 15 mg 3x/day- for spasticity  8. Con't Flexeril/refilled 10 mg nightly- for back spasms- #5 refills  9. Increase Tramadol to 2 tabs 2x/day as needed #120 with 3 refills- might make you more constipated slightly.   10. Con't Flomax 0.8 mg nightly- since voiding well with it.   11. Con't Lidoderm patches  2 patches 12 hrs on; 12 hrs off- refills 5  12. F/U in 3 months- double appointment.

## 2020-09-01 ENCOUNTER — Encounter (HOSPITAL_COMMUNITY)
Admission: RE | Admit: 2020-09-01 | Discharge: 2020-09-01 | Disposition: A | Discharge status: Home / Self Care | Payer: Managed Care, Other (non HMO) | Source: Ambulatory Visit | Attending: Nurse Practitioner | Admitting: Nurse Practitioner

## 2020-09-01 ENCOUNTER — Other Ambulatory Visit: Payer: Self-pay

## 2020-09-01 DIAGNOSIS — E0590 Thyrotoxicosis, unspecified without thyrotoxic crisis or storm: Secondary | ICD-10-CM | POA: Insufficient documentation

## 2020-09-01 DIAGNOSIS — E0500 Thyrotoxicosis with diffuse goiter without thyrotoxic crisis or storm: Secondary | ICD-10-CM | POA: Diagnosis present

## 2020-09-01 MED ORDER — SODIUM IODIDE I-123 7.4 MBQ CAPS
302.0000 | ORAL_CAPSULE | Freq: Once | ORAL | Status: AC
  Administered 2020-09-01: 302 via ORAL

## 2020-09-02 ENCOUNTER — Encounter (HOSPITAL_COMMUNITY)
Admission: RE | Admit: 2020-09-02 | Discharge: 2020-09-02 | Disposition: A | Discharge status: Home / Self Care | Payer: Managed Care, Other (non HMO) | Source: Ambulatory Visit | Attending: Nurse Practitioner | Admitting: Nurse Practitioner

## 2020-09-08 ENCOUNTER — Telehealth: Payer: Self-pay

## 2020-09-08 NOTE — Telephone Encounter (Signed)
Caren Griffins with [employer] called to follow up on paperwork needed for a power-wheelchair.  If you have the forms please sign and fax back to [phone].  Thank you

## 2020-09-09 NOTE — Telephone Encounter (Signed)
I signed and asked front dek to fax back today- ML

## 2020-09-26 ENCOUNTER — Telehealth: Payer: Self-pay

## 2020-09-26 NOTE — Telephone Encounter (Signed)
Patient left a VM requesting a call bck from the nurse

## 2020-09-26 NOTE — Telephone Encounter (Signed)
Pt states that he has lost his insurance and is not eligible for Medicare until November 1st. He had an uptake & scan and has an appointment on 8/23 and wanted to know if it can be postponed until November or if there is something he needs to address quickly.

## 2020-09-27 ENCOUNTER — Other Ambulatory Visit: Payer: Self-pay | Admitting: "Endocrinology

## 2020-09-27 DIAGNOSIS — E0590 Thyrotoxicosis, unspecified without thyrotoxic crisis or storm: Secondary | ICD-10-CM

## 2020-09-27 NOTE — Telephone Encounter (Signed)
Mailed lab order.

## 2020-10-04 ENCOUNTER — Ambulatory Visit: Payer: 59 | Admitting: "Endocrinology

## 2020-10-13 ENCOUNTER — Telehealth: Payer: Self-pay

## 2020-10-13 NOTE — Telephone Encounter (Signed)
Received medical records request from [facility]. Sent to [facility] Records.

## 2020-10-27 ENCOUNTER — Other Ambulatory Visit: Payer: Self-pay | Admitting: "Endocrinology

## 2020-10-27 LAB — TSH: TSH: 15.9 u[IU]/mL — ABNORMAL HIGH (ref 0.450–4.500)

## 2020-10-27 LAB — T4, FREE: Free T4: 0.44 ng/dL — ABNORMAL LOW (ref 0.82–1.77)

## 2020-10-27 LAB — T3, FREE: T3, Free: 1.5 pg/mL — ABNORMAL LOW (ref 2.0–4.4)

## 2020-10-27 MED ORDER — LEVOTHYROXINE SODIUM 50 MCG PO TABS: 50.0000 ug | ORAL_TABLET | Freq: Every day | ORAL | 2 refills | Status: DC

## 2020-10-29 ENCOUNTER — Other Ambulatory Visit: Payer: Self-pay | Admitting: Physical Medicine and Rehabilitation

## 2020-10-31 NOTE — Telephone Encounter (Signed)
Dr. Dagoberto Ligas is out of the office please refill or advise.  Last appointment 08/26/2020. Next appointment 12/26/2020. Thank you

## 2020-11-03 ENCOUNTER — Telehealth: Payer: Self-pay

## 2020-11-03 NOTE — Telephone Encounter (Signed)
PA sent in Cover My Meds for Lidocaine 5% patches.

## 2020-11-04 NOTE — Telephone Encounter (Signed)
denied °

## 2020-11-07 NOTE — Telephone Encounter (Signed)
Patient aware of denial.

## 2020-11-18 ENCOUNTER — Ambulatory Visit: Payer: Managed Care, Other (non HMO) | Admitting: Physical Medicine and Rehabilitation

## 2020-11-29 ENCOUNTER — Other Ambulatory Visit: Payer: Self-pay | Admitting: Physical Medicine and Rehabilitation

## 2020-12-01 NOTE — Telephone Encounter (Signed)
Denial faxed to pharmacy with note to tell them to try OTC [place] 4% lidocaine patches.

## 2020-12-05 ENCOUNTER — Other Ambulatory Visit: Payer: Self-pay | Admitting: "Endocrinology

## 2020-12-05 NOTE — Telephone Encounter (Signed)
Pt did labs in September like you stated, he is wanting to know if he needs more blood work before his appt next week.

## 2020-12-06 ENCOUNTER — Other Ambulatory Visit: Payer: Self-pay | Admitting: "Endocrinology

## 2020-12-06 ENCOUNTER — Telehealth: Payer: Self-pay

## 2020-12-06 LAB — T4, FREE: Free T4: 0.62 ng/dL — ABNORMAL LOW (ref 0.82–1.77)

## 2020-12-06 LAB — T3, FREE: T3, Free: 1.3 pg/mL — ABNORMAL LOW (ref 2.0–4.4)

## 2020-12-06 LAB — TSH: TSH: 35.5 u[IU]/mL — ABNORMAL HIGH (ref 0.450–4.500)

## 2020-12-06 MED ORDER — LEVOTHYROXINE SODIUM 88 MCG PO TABS: 88.0000 ug | ORAL_TABLET | Freq: Every day | ORAL | 2 refills | Status: DC

## 2020-12-06 NOTE — Telephone Encounter (Signed)
Dr Dorris Fetch, I called patient to make him aware and he would like to know if he needs to keep the appointment or do you want to move it out?

## 2020-12-06 NOTE — Telephone Encounter (Signed)
Left pt a VM to return our call to discuss

## 2020-12-06 NOTE — Telephone Encounter (Signed)
Patient said he seen that his thyroid labs are back and he feels like he needs his medication adjusted. He would like to know could you go ahead and do so or wait until his appt 11/3. His medicare will be in effect on 11/1 so we can not move the appointment up

## 2020-12-15 ENCOUNTER — Encounter: Payer: Self-pay | Admitting: "Endocrinology

## 2020-12-15 ENCOUNTER — Other Ambulatory Visit: Payer: Self-pay

## 2020-12-15 ENCOUNTER — Ambulatory Visit (INDEPENDENT_AMBULATORY_CARE_PROVIDER_SITE_OTHER): Payer: Medicare Other | Admitting: "Endocrinology

## 2020-12-15 VITALS — BP 126/86 | HR 60 | Ht 70.0 in

## 2020-12-15 DIAGNOSIS — E890 Postprocedural hypothyroidism: Secondary | ICD-10-CM

## 2020-12-15 MED ORDER — LEVOTHYROXINE SODIUM 100 MCG PO TABS: 100.0000 ug | ORAL_TABLET | Freq: Every day | ORAL | 1 refills | Status: DC
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
## 2020-12-26 ENCOUNTER — Other Ambulatory Visit: Payer: Self-pay

## 2020-12-26 ENCOUNTER — Telehealth: Payer: Self-pay | Admitting: "Endocrinology

## 2020-12-26 ENCOUNTER — Encounter: Payer: Self-pay | Admitting: Physical Medicine and Rehabilitation

## 2020-12-26 ENCOUNTER — Encounter
Payer: Medicare Other | Attending: Physical Medicine and Rehabilitation | Admitting: Physical Medicine and Rehabilitation

## 2020-12-26 VITALS — BP 152/97 | HR 81 | Ht 70.0 in | Wt 203.8 lb

## 2020-12-26 DIAGNOSIS — G8250 Quadriplegia, unspecified: Secondary | ICD-10-CM | POA: Diagnosis present

## 2020-12-26 DIAGNOSIS — Z993 Dependence on wheelchair: Secondary | ICD-10-CM

## 2020-12-26 DIAGNOSIS — G959 Disease of spinal cord, unspecified: Secondary | ICD-10-CM | POA: Diagnosis present

## 2020-12-26 DIAGNOSIS — R252 Cramp and spasm: Secondary | ICD-10-CM | POA: Diagnosis present

## 2020-12-26 MED ORDER — CYCLOBENZAPRINE HCL 10 MG PO TABS: 10.0000 mg | ORAL_TABLET | Freq: Every day | ORAL | 1 refills | Status: DC

## 2020-12-26 MED ORDER — TAMSULOSIN HCL 0.4 MG PO CAPS: 0.8000 mg | ORAL_CAPSULE | Freq: Every day | ORAL | 1 refills | Status: DC

## 2020-12-26 MED ORDER — BACLOFEN 5 MG PO TABS: ORAL_TABLET | ORAL | 1 refills | Status: DC

## 2020-12-26 NOTE — Patient Instructions (Addendum)
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 Also has has BPH and Afib.    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    Will decrease Flomax to 0.4 mg nightly- and TRY this dose- if starts to retain, will need to go back to 0.8 mg nightly. Will already on Flomax 0.4 mg nightly at home. - #180 with 1 refill.   2.   Con't Baclofen 15 mg TID- forgets lunch time meds- didn't take today- decrease to 10 mg 3x/day- try breakfast, dinner and bedtime. But will con't Baclofen 15 mg TID Rx- 3 months supply- with 1 refill.    3.  Take tramadol only if needed- has some at home.    4. Con't Flexeril 10 mg nightly - working well.  90 days suply- 6 month refill.   5. Blood thinners put you at high risk of bleeding/bruising.    6.  F/U in 6 months- call me if any additional issues.   7. BP/Thyroid per Endo.   8. Cannot drive at this time due to weaker plantarflexion. SORRY!
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
## 2020-12-26 NOTE — Telephone Encounter (Signed)
Pt lvm requesting a call back from you, states his BP was 152/97 and his rehab doctor wanted him to reach out to [place], states it went up after starting new medication we prescribed.

## 2020-12-27 NOTE — Telephone Encounter (Signed)
Elevated blood pressure likely unrelated to thyroid medication dosage increase.  Dr. Dorris Fetch mentioned that he was taken off his Metoprolol prior to his last visit, could be contributing some to his higher BP readings.  I recommend he follow up with his PCP regarding his elevated BP readings.

## 2020-12-27 NOTE — Telephone Encounter (Signed)
Pt states at his last visit he was unable to weigh and gave Dr.Nida an estimated weight of 180lbs. States when he went for his rehab follow up appointment yesterday they were able to weigh him which resulted as 204lbs. States since changing his levothyroxine to 157mcg pt's BP has been elevated ranging from 144/99 to yesterday 152/97. States he is not experiencing any other symptoms and has not had any other changes in his medications.

## 2020-12-27 NOTE — Telephone Encounter (Signed)
Discussed with pt that even with adjusting for his weight that the levothyroxine 177mcg is a safe dose per Whitney Reardon,FNP. Advised pt to continue to monitor BP and call the office on Thursday with readings. Pt voiced understanding.

## 2020-12-29 NOTE — Telephone Encounter (Signed)
Pt called back and states that his BP has been perfect up until lately. He is requesting a phone call from Dr Dorris Fetch due to elevated BP of 152/101. He states that the Levothyroxine is the only change. Pt was just concerned.

## 2020-12-29 NOTE — Telephone Encounter (Signed)
Pt is requesting a call back. 

## 2020-12-30 ENCOUNTER — Other Ambulatory Visit: Payer: Self-pay | Admitting: Physical Medicine & Rehabilitation

## 2020-12-30 NOTE — Telephone Encounter (Signed)
Pharmacy sending a refill for Xarelto. Would you like to refill?

## 2020-12-30 NOTE — Telephone Encounter (Signed)
Patient is calling to check on what he should do. Please Advise

## 2021-01-02 NOTE — Telephone Encounter (Signed)
Discussed with pt, understanding voiced. Pt states he needs a Rx sent in for the metoprolol.

## 2021-01-02 NOTE — Telephone Encounter (Signed)
Joy, Can you call this pt

## 2021-01-03 ENCOUNTER — Other Ambulatory Visit (HOSPITAL_COMMUNITY): Payer: Self-pay | Admitting: Nurse Practitioner

## 2021-01-03 ENCOUNTER — Other Ambulatory Visit: Payer: Self-pay | Admitting: Nurse Practitioner

## 2021-01-03 DIAGNOSIS — K766 Portal hypertension: Secondary | ICD-10-CM

## 2021-01-03 DIAGNOSIS — K7469 Other cirrhosis of liver: Secondary | ICD-10-CM

## 2021-01-03 DIAGNOSIS — D376 Neoplasm of uncertain behavior of liver, gallbladder and bile ducts: Secondary | ICD-10-CM

## 2021-01-23 ENCOUNTER — Ambulatory Visit (HOSPITAL_COMMUNITY)
Admission: RE | Admit: 2021-01-23 | Discharge: 2021-01-23 | Disposition: A | Discharge status: Home / Self Care | Payer: Medicare Other | Source: Ambulatory Visit | Attending: Nurse Practitioner | Admitting: Nurse Practitioner

## 2021-01-23 ENCOUNTER — Other Ambulatory Visit: Payer: Self-pay

## 2021-01-23 DIAGNOSIS — K7469 Other cirrhosis of liver: Secondary | ICD-10-CM | POA: Diagnosis present

## 2021-01-23 DIAGNOSIS — D376 Neoplasm of uncertain behavior of liver, gallbladder and bile ducts: Secondary | ICD-10-CM | POA: Diagnosis present

## 2021-01-23 DIAGNOSIS — K766 Portal hypertension: Secondary | ICD-10-CM

## 2021-02-22 ENCOUNTER — Other Ambulatory Visit: Payer: Self-pay | Admitting: Physical Medicine and Rehabilitation

## 2021-02-22 ENCOUNTER — Other Ambulatory Visit (HOSPITAL_COMMUNITY): Payer: Self-pay

## 2021-03-01 ENCOUNTER — Other Ambulatory Visit: Payer: Self-pay | Admitting: Neurosurgery

## 2021-03-01 DIAGNOSIS — M4802 Spinal stenosis, cervical region: Secondary | ICD-10-CM

## 2021-03-01 DIAGNOSIS — M48062 Spinal stenosis, lumbar region with neurogenic claudication: Secondary | ICD-10-CM

## 2021-03-15 LAB — TSH: TSH: 34.1 u[IU]/mL — ABNORMAL HIGH (ref 0.450–4.500)

## 2021-03-15 LAB — T4, FREE: Free T4: 0.67 ng/dL — ABNORMAL LOW (ref 0.82–1.77)

## 2021-03-20 ENCOUNTER — Other Ambulatory Visit: Payer: Self-pay

## 2021-03-20 ENCOUNTER — Ambulatory Visit (INDEPENDENT_AMBULATORY_CARE_PROVIDER_SITE_OTHER): Payer: Medicare Other | Admitting: "Endocrinology

## 2021-03-20 ENCOUNTER — Encounter: Payer: Self-pay | Admitting: "Endocrinology

## 2021-03-20 VITALS — BP 126/84 | HR 72 | Ht 70.0 in | Wt 222.0 lb

## 2021-03-20 DIAGNOSIS — E890 Postprocedural hypothyroidism: Secondary | ICD-10-CM | POA: Diagnosis not present

## 2021-03-20 MED ORDER — LEVOTHYROXINE SODIUM 137 MCG PO TABS: 137.0000 ug | ORAL_TABLET | Freq: Every day | ORAL | 1 refills | Status: DC
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
## 2021-03-27 ENCOUNTER — Ambulatory Visit
Admission: RE | Admit: 2021-03-27 | Discharge: 2021-03-27 | Disposition: A | Discharge status: Home / Self Care | Payer: Medicare Other | Source: Ambulatory Visit | Attending: Neurosurgery | Admitting: Neurosurgery

## 2021-03-27 DIAGNOSIS — M4802 Spinal stenosis, cervical region: Secondary | ICD-10-CM

## 2021-03-27 DIAGNOSIS — M48062 Spinal stenosis, lumbar region with neurogenic claudication: Secondary | ICD-10-CM

## 2021-03-27 MED ORDER — MEPERIDINE HCL 50 MG/ML IJ SOLN: 50.0000 mg | Freq: Once | INTRAMUSCULAR | Status: DC | PRN

## 2021-03-27 MED ORDER — IOPAMIDOL (ISOVUE-M 300) INJECTION 61%
10.0000 mL | Freq: Once | INTRAMUSCULAR | Status: AC
  Administered 2021-03-27: 10 mL via INTRATHECAL

## 2021-03-27 MED ORDER — ONDANSETRON HCL 4 MG/2ML IJ SOLN: 4.0000 mg | Freq: Once | INTRAMUSCULAR | Status: DC | PRN

## 2021-03-27 MED ORDER — DIAZEPAM 5 MG PO TABS
5.0000 mg | ORAL_TABLET | Freq: Once | ORAL | Status: AC
  Administered 2021-03-27: 5 mg via ORAL

## 2021-03-27 NOTE — Discharge Instructions (Signed)

## 2021-05-09 ENCOUNTER — Telehealth: Payer: Self-pay | Admitting: "Endocrinology

## 2021-05-09 NOTE — Telephone Encounter (Signed)
Talked with pt, made him aware I have not seen a form regarding surgical clearance. Requested they fax form to our office and confirmed our fax number. ?

## 2021-05-09 NOTE — Telephone Encounter (Signed)
Pt is requesting your call back. He states he is needing knee replacement surgery but has to have clearance from all of his doctors. He states he has already discussed this with Dr. Dorris Fetch he just has to fill the form out. Patient states [facility] said they have sent it to [place] multiple times but we have not sent anything back. Please advise.  ?

## 2021-05-15 ENCOUNTER — Telehealth: Payer: Self-pay

## 2021-05-15 ENCOUNTER — Telehealth: Payer: Self-pay | Admitting: "Endocrinology

## 2021-05-15 NOTE — Telephone Encounter (Signed)
Pt states he is scheduled for his knee replacement on May 9th which is the same day as his appointment with you. Asked if he could do his labs earlier and make needed adjustments or if you would want him to be seen prior to his surgery. ?

## 2021-05-15 NOTE — Telephone Encounter (Signed)
Patient would like you to return his call. Thank you ?

## 2021-05-15 NOTE — Telephone Encounter (Signed)
Discussed with pt, rescheduled for May 2nd at 1:30p.m. ?

## 2021-05-15 NOTE — Telephone Encounter (Signed)
As advised : Mr. Darren Harrison called to inform  you of his scheduled right knee surgerym on Jun 20, 2021. Call back phone [phone]. ?

## 2021-05-17 NOTE — Telephone Encounter (Signed)
Thank you- tell him I hope he does well- ML

## 2021-05-24 ENCOUNTER — Other Ambulatory Visit: Payer: Self-pay | Admitting: Orthopaedic Surgery

## 2021-06-09 LAB — TSH: TSH: 26.6 u[IU]/mL — ABNORMAL HIGH (ref 0.450–4.500)

## 2021-06-09 LAB — T4, FREE: Free T4: 1.18 ng/dL (ref 0.82–1.77)

## 2021-06-12 NOTE — Patient Instructions (Signed)
DUE TO COVID-19 ONLY TWO VISITORS  (aged 66 and older)  ARE ALLOWED TO COME WITH YOU AND STAY IN THE WAITING ROOM ONLY DURING PRE OP AND PROCEDURE.   ?**NO VISITORS ARE ALLOWED IN THE SHORT STAY AREA OR RECOVERY ROOM!!** ? ?IF YOU WILL BE ADMITTED INTO THE HOSPITAL YOU ARE ALLOWED ONLY FOUR SUPPORT PEOPLE DURING VISITATION HOURS ONLY (7 AM -8PM)   ?The support person(s) must pass our screening, gel in and out, and wear a mask at all times, including in the patient?s room. ?Patients must also wear a mask when staff or their support person are in the room. ?Visitors GUEST BADGE MUST BE WORN VISIBLY  ?One adult visitor may remain with you overnight and MUST be in the room by 8 P.M. ?  ? ? Your procedure is scheduled on: 06/20/21 ? ? Report to [facility] Main Entrance ? ?  Report to admitting at   9:45 AM ? ? Call this number if you have problems the morning of surgery [phone] ? ? Do not eat food :After Midnight. ? ? After Midnight you may have the following liquids until _9:15_ AM/  DAY OF SURGERY ? ?Water ?Black Coffee (sugar ok, NO MILK/CREAM OR CREAMERS)  ?Tea (sugar ok, NO MILK/CREAM OR CREAMERS) regular and decaf                             ?Plain Jell-O (NO RED)                                           ?Fruit ices (not with fruit pulp, NO RED)                                     ?Popsicles (NO RED)                                                                  ?Juice: apple, WHITE grape, WHITE cranberry ?Sports drinks like Gatorade (NO RED) ?Clear broth(vegetable,chicken,beef) ? ?            ? ?  ?  ?The day of surgery:  ?Drink ONE (1) Pre-Surgery Clear Ensure  at 9:00 AM the morning of surgery. Drink in one sitting. Do not sip.  ?This drink was given to you during your hospital  ?pre-op appointment visit. ?Nothing else to drink after completing the  ?Pre-Surgery Clear Ensure 9:15 am. ?  ?       If you have questions, please contact your surgeon?s office. ? ? ?  ?  ?Oral Hygiene is also important  to reduce your risk of infection.                                    ?Remember - BRUSH YOUR TEETH THE MORNING OF SURGERY WITH YOUR REGULAR TOOTHPASTE ? ? Do NOT smoke after Midnight ? ? Take these medicines the morning of surgery with A SIP OF WATER: Metoprolol,  Levothyroxine ? ? ?Bring CPAP mask and tubing day of surgery. ?                  ?           You may not have any metal on your body including , jewelry, and body piercing ? ?           Do not wear lotions, powders, perfumes/cologne, or deodorant ? ?            Men may shave face and neck. ? ? Do not bring valuables to the hospital. [facility] NOT ?            RESPONSIBLE   FOR VALUABLES. ? ? Contacts, dentures or bridgework may not be worn into surgery. ? ? Bring small overnight bag day of surgery. ?  ? Patients discharged on the day of surgery will not be allowed to drive home.  Someone NEEDS to stay with you for the first 24 hours after anesthesia. ? ? Special Instructions: Bring a copy of your healthcare power of attorney and living will documents the day of surgery if you haven't scanned them before. ? ?            Please read over the following fact sheets you were given: IF [place] [phone] ? ?   [place] - Preparing for Surgery ?Before surgery, you can play an important role.  Because skin is not sterile, your skin needs to be as free of germs as possible.  You can reduce the number of germs on your skin by washing with CHG (chlorahexidine gluconate) soap before surgery.  CHG is an antiseptic cleaner which kills germs and bonds with the skin to continue killing germs even after washing. ?Please DO NOT use if you have an allergy to CHG or antibacterial soaps.  If your skin becomes reddened/irritated stop using the CHG and inform your nurse when you arrive at Short Stay.  You may shave your face/neck. ?Please follow these instructions carefully: ? 1.  Shower with CHG Soap the night before  surgery and the  morning of Surgery. ? 2.  If you choose to wash your hair, wash your hair first as usual with your  normal  shampoo. ? 3.  After you shampoo, rinse your hair and body thoroughly to remove the  shampoo.                      ?      4.  Use CHG as you would any other liquid soap.  You can apply chg directly  to the skin and wash  ?                     Gently with a scrungie or clean washcloth. ? 5.  Apply the CHG Soap to your body ONLY FROM THE NECK DOWN.   Do not use on face/ open      ?                     Wound or open sores. Avoid contact with eyes, ears mouth and genitals (private parts).  ?                     Production manager,  Genitals (private parts) with your normal soap. ?  6.  Wash thoroughly, paying special attention to the area where your surgery  will be performed. ? 7.  Thoroughly rinse your body with warm water from the neck down. ? 8.  DO NOT shower/wash with your normal soap after using and rinsing off  the CHG Soap. ?               9.  Pat yourself dry with a clean towel. ?           10.  Wear clean pajamas. ?           11.  Place clean sheets on your bed the night of your first shower and do not  sleep with pets. ?Day of Surgery : ?Do not apply any lotions/deodorants the morning of surgery.  Please wear clean clothes to the hospital/surgery center. ? ?FAILURE TO FOLLOW THESE INSTRUCTIONS MAY RESULT IN THE CANCELLATION OF YOUR SURGERY ?PATIENT SIGNATURE_________________________________ ? ?NURSE SIGNATURE__________________________________ ? ?________________________________________________________________________  ? ?Incentive Spirometer ? ?An incentive spirometer is a tool that can help keep your lungs clear and active. This tool measures how well you are filling your lungs with each breath. Taking long deep breaths may help reverse or decrease the chance of developing breathing (pulmonary) problems (especially infection) following: ?A long period of time when you are unable to  move or be active. ?BEFORE THE PROCEDURE  ?If the spirometer includes an indicator to show your best effort, your nurse or respiratory therapist will set it to a desired goal. ?If possible, sit up straight or lean slightly forward. Try not to slouch. ?Hold the incentive spirometer in an upright position. ?INSTRUCTIONS FOR USE  ?Sit on the edge of your bed if possible, or sit up as far as you can in bed or on a chair. ?Hold the incentive spirometer in an upright position. ?Breathe out normally. ?Place the mouthpiece in your mouth and seal your lips tightly around it. ?Breathe in slowly and as deeply as possible, raising the piston or the ball toward the top of the column. ?Hold your breath for 3-5 seconds or for as long as possible. Allow the piston or ball to fall to the bottom of the column. ?Remove the mouthpiece from your mouth and breathe out normally. ?Rest for a few seconds and repeat Steps 1 through 7 at least 10 times every 1-2 hours when you are awake. Take your time and take a few normal breaths between deep breaths. ?The spirometer may include an indicator to show your best effort. Use the indicator as a goal to work toward during each repetition. ?After each set of 10 deep breaths, practice coughing to be sure your lungs are clear. If you have an incision (the cut made at the time of surgery), support your incision when coughing by placing a pillow or rolled up towels firmly against it. ?Once you are able to get out of bed, walk around indoors and cough well. You may stop using the incentive spirometer when instructed by your caregiver.  ?RISKS AND COMPLICATIONS ?Take your time so you do not get dizzy or light-headed. ?If you are in pain, you may need to take or ask for pain medication before doing incentive spirometry. It is harder to take a deep breath if you are having pain. ?AFTER USE ?Rest and breathe slowly and easily. ?It can be helpful to keep track of a log of your progress. Your caregiver can  provide you with a simple table to help with this. ?If you are  using the spirometer at home, follow these instructions: ?SEEK MEDICAL CARE IF:  ?You are having difficultly using the spirometer. ?You ha

## 2021-06-13 ENCOUNTER — Encounter (HOSPITAL_COMMUNITY): Payer: Self-pay

## 2021-06-13 ENCOUNTER — Encounter: Payer: Self-pay | Admitting: "Endocrinology

## 2021-06-13 ENCOUNTER — Ambulatory Visit (INDEPENDENT_AMBULATORY_CARE_PROVIDER_SITE_OTHER): Payer: Medicare Other | Admitting: "Endocrinology

## 2021-06-13 ENCOUNTER — Ambulatory Visit (HOSPITAL_COMMUNITY)
Admission: RE | Admit: 2021-06-13 | Discharge: 2021-06-13 | Disposition: A | Discharge status: Home / Self Care | Payer: Medicare Other | Source: Ambulatory Visit | Attending: Orthopaedic Surgery | Admitting: Orthopaedic Surgery

## 2021-06-13 ENCOUNTER — Encounter (HOSPITAL_COMMUNITY)
Admission: RE | Admit: 2021-06-13 | Discharge: 2021-06-13 | Disposition: A | Discharge status: Home / Self Care | Payer: Medicare Other | Source: Ambulatory Visit | Attending: Orthopaedic Surgery | Admitting: Orthopaedic Surgery

## 2021-06-13 ENCOUNTER — Other Ambulatory Visit: Payer: Self-pay

## 2021-06-13 VITALS — BP 124/92 | HR 59 | Temp 97.9°F | Resp 1 | Ht 70.0 in | Wt 238.0 lb

## 2021-06-13 VITALS — BP 124/82 | HR 68 | Ht 70.0 in | Wt 238.0 lb

## 2021-06-13 DIAGNOSIS — Z01818 Encounter for other preprocedural examination: Secondary | ICD-10-CM | POA: Diagnosis present

## 2021-06-13 DIAGNOSIS — K766 Portal hypertension: Secondary | ICD-10-CM | POA: Insufficient documentation

## 2021-06-13 DIAGNOSIS — E890 Postprocedural hypothyroidism: Secondary | ICD-10-CM | POA: Diagnosis not present

## 2021-06-13 HISTORY — DX: Hypothyroidism, unspecified: E03.9

## 2021-06-13 HISTORY — DX: Cardiac arrhythmia, unspecified: I49.9

## 2021-06-13 LAB — COMPREHENSIVE METABOLIC PANEL
ALT: 31 U/L (ref 0–44)
AST: 28 U/L (ref 15–41)
Albumin: 4.2 g/dL (ref 3.5–5.0)
Alkaline Phosphatase: 106 U/L (ref 38–126)
Anion gap: 4 — ABNORMAL LOW (ref 5–15)
BUN: 22 mg/dL (ref 8–23)
CO2: 30 mmol/L (ref 22–32)
Calcium: 9.2 mg/dL (ref 8.9–10.3)
Chloride: 106 mmol/L (ref 98–111)
Creatinine, Ser: 0.68 mg/dL (ref 0.61–1.24)
GFR, Estimated: >60 mL/min (ref >60)
Glucose, Bld: 85 mg/dL (ref 70–99)
Potassium: 4.8 mmol/L (ref 3.5–5.1)
Sodium: 140 mmol/L (ref 135–145)
Total Bilirubin: 0.4 mg/dL (ref 0.3–1.2)
Total Protein: 7 g/dL (ref 6.5–8.1)

## 2021-06-13 LAB — CBC
HCT: 40.4 % (ref 39.0–52.0)
Hemoglobin: 13 g/dL (ref 13.0–17.0)
MCH: 31.1 pg (ref 26.0–34.0)
MCHC: 32.2 g/dL (ref 30.0–36.0)
MCV: 96.7 fL (ref 80.0–100.0)
Platelets: 138 10*3/uL — ABNORMAL LOW (ref 150–400)
RBC: 4.18 MIL/uL — ABNORMAL LOW (ref 4.22–5.81)
RDW: 12.9 % (ref 11.5–15.5)
WBC: 5.6 10*3/uL (ref 4.0–10.5)
nRBC: 0 % (ref 0.0–0.2)

## 2021-06-13 LAB — SURGICAL PCR SCREEN
MRSA, PCR: NEGATIVE
Staphylococcus aureus: NEGATIVE

## 2021-06-13 MED ORDER — LEVOTHYROXINE SODIUM 150 MCG PO TABS: 150.0000 ug | ORAL_TABLET | Freq: Every day | ORAL | 1 refills | Status: DC

## 2021-06-13 NOTE — Progress Notes (Signed)
Anesthesia note: ? ?Bowel prep reminder:NA ? ?PCP - [place] ?Cardiologist -Dr. Burney Gauze, [place] ?Other-  ? ?Chest x-ray - no ?EKG - 06/13/21-chart ?Stress Test - 02/18/20-CE ?ECHO - no ?Cardiac Cath - NA ? ?Pacemaker/ICD device last checked:NA ? ?Sleep Study - no ?CPAP -  ? ?Pt is pre diabetic-NA ?Fasting Blood Sugar -  ?Checks Blood Sugar _____ ? ?Blood Thinner:Xarelto for A-Fib / Dr. Bartholome Bill ?Blood Thinner Instructions:stop 3 days prior to DOS/ Dr. Norm Salt ?Aspirin Instructions: ?Last Dose:06/16/21 ? ?Anesthesia review: yes ? 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 He uses a wheelchair and can stand to pivot but his knees give out.  ? ?Patient verbalized understanding of instructions that were given to them at the PAT appointment. Patient was also instructed that they will need to review over the PAT instructions again at home before surgery. Yes. Wife was with him. ?
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
## 2021-06-15 ENCOUNTER — Telehealth: Payer: Self-pay | Admitting: *Deleted

## 2021-06-15 NOTE — Telephone Encounter (Signed)
Darren Harrison called with concerns over cyclobenzaprine. He is having surgery next week (TKR) and was looking at medications to stop taking.He was reading about side effects if you have fatty liver disease or prostate issues, (which he has both) it is not recommended. He is just looking for advice about it since he has been taking it for some time now. I let him know that Dr Dagoberto Ligas will not respond until she returns to the office next Monday. He can talk with his surgeon about it as well as they will probably prescribe one after his surgery.   ?
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
## 2021-06-20 ENCOUNTER — Inpatient Hospital Stay (HOSPITAL_COMMUNITY)
Admission: RE | Admit: 2021-06-20 | Discharge: 2021-06-22 | DRG: 470 | Disposition: A | Discharge status: Home Health | Payer: Medicare Other | Source: Ambulatory Visit | Attending: Orthopaedic Surgery | Admitting: Orthopaedic Surgery

## 2021-06-20 ENCOUNTER — Ambulatory Visit (HOSPITAL_COMMUNITY): Payer: Medicare Other | Admitting: Physician Assistant

## 2021-06-20 ENCOUNTER — Ambulatory Visit (HOSPITAL_BASED_OUTPATIENT_CLINIC_OR_DEPARTMENT_OTHER): Payer: Medicare Other | Admitting: Anesthesiology

## 2021-06-20 ENCOUNTER — Other Ambulatory Visit: Payer: Self-pay

## 2021-06-20 ENCOUNTER — Encounter (HOSPITAL_COMMUNITY)
Admission: RE | Disposition: A | Discharge status: Home Health | Payer: Self-pay | Source: Ambulatory Visit | Attending: Orthopaedic Surgery

## 2021-06-20 ENCOUNTER — Encounter (HOSPITAL_COMMUNITY): Payer: Self-pay | Admitting: Orthopaedic Surgery

## 2021-06-20 ENCOUNTER — Ambulatory Visit: Payer: Medicare Other | Admitting: "Endocrinology

## 2021-06-20 DIAGNOSIS — Z79899 Other long term (current) drug therapy: Secondary | ICD-10-CM

## 2021-06-20 DIAGNOSIS — I10 Essential (primary) hypertension: Secondary | ICD-10-CM

## 2021-06-20 DIAGNOSIS — N400 Enlarged prostate without lower urinary tract symptoms: Secondary | ICD-10-CM

## 2021-06-20 DIAGNOSIS — Z79890 Hormone replacement therapy (postmenopausal): Secondary | ICD-10-CM

## 2021-06-20 DIAGNOSIS — M1711 Unilateral primary osteoarthritis, right knee: Principal | ICD-10-CM | POA: Diagnosis present

## 2021-06-20 DIAGNOSIS — K219 Gastro-esophageal reflux disease without esophagitis: Secondary | ICD-10-CM | POA: Diagnosis present

## 2021-06-20 DIAGNOSIS — Z96659 Presence of unspecified artificial knee joint: Secondary | ICD-10-CM

## 2021-06-20 DIAGNOSIS — E890 Postprocedural hypothyroidism: Secondary | ICD-10-CM | POA: Diagnosis present

## 2021-06-20 DIAGNOSIS — Z981 Arthrodesis status: Secondary | ICD-10-CM

## 2021-06-20 DIAGNOSIS — Z833 Family history of diabetes mellitus: Secondary | ICD-10-CM

## 2021-06-20 DIAGNOSIS — I4891 Unspecified atrial fibrillation: Secondary | ICD-10-CM | POA: Diagnosis present

## 2021-06-20 DIAGNOSIS — Z8349 Family history of other endocrine, nutritional and metabolic diseases: Secondary | ICD-10-CM

## 2021-06-20 DIAGNOSIS — Z8249 Family history of ischemic heart disease and other diseases of the circulatory system: Secondary | ICD-10-CM

## 2021-06-20 DIAGNOSIS — Z7901 Long term (current) use of anticoagulants: Secondary | ICD-10-CM

## 2021-06-20 DIAGNOSIS — Z87891 Personal history of nicotine dependence: Secondary | ICD-10-CM

## 2021-06-20 HISTORY — PX: TOTAL KNEE ARTHROPLASTY: SHX125

## 2021-06-20 LAB — TYPE AND SCREEN
ABO/RH(D): O POS
Antibody Screen: NEGATIVE

## 2021-06-20 SURGERY — ARTHROPLASTY, KNEE, TOTAL
Anesthesia: Spinal | Site: Knee | Laterality: Right

## 2021-06-20 MED ORDER — METOCLOPRAMIDE HCL 5 MG PO TABS: 5.0000 mg | ORAL_TABLET | Freq: Three times a day (TID) | ORAL | Status: DC | PRN

## 2021-06-20 MED ORDER — SODIUM CHLORIDE (PF) 0.9 % IJ SOLN
INTRAMUSCULAR | Status: DC | PRN
  Administered 2021-06-20: 30 mL

## 2021-06-20 MED ORDER — BUPIVACAINE LIPOSOME 1.3 % IJ SUSP
INTRAMUSCULAR | Status: AC
  Filled 2021-06-20: qty 20

## 2021-06-20 MED ORDER — TAMSULOSIN HCL 0.4 MG PO CAPS
0.8000 mg | ORAL_CAPSULE | Freq: Every day | ORAL | Status: DC
Start: 2021-06-20 — End: 2021-06-22
  Administered 2021-06-20 – 2021-06-21 (×2): 0.8 mg via ORAL
  Filled 2021-06-20 (×2): qty 2

## 2021-06-20 MED ORDER — TRANEXAMIC ACID 1000 MG/10ML IV SOLN
2000.0000 mg | INTRAVENOUS | Status: DC
  Filled 2021-06-20: qty 20

## 2021-06-20 MED ORDER — METHOCARBAMOL 1000 MG/10ML IJ SOLN
500.0000 mg | Freq: Four times a day (QID) | INTRAVENOUS | Status: DC | PRN
  Filled 2021-06-20: qty 5

## 2021-06-20 MED ORDER — FENTANYL CITRATE PF 50 MCG/ML IJ SOSY
PREFILLED_SYRINGE | INTRAMUSCULAR | Status: AC
  Administered 2021-06-20: 50 ug via INTRAVENOUS
  Filled 2021-06-20: qty 2

## 2021-06-20 MED ORDER — ONDANSETRON HCL 4 MG/2ML IJ SOLN: 4.0000 mg | Freq: Four times a day (QID) | INTRAMUSCULAR | Status: DC | PRN

## 2021-06-20 MED ORDER — TRANEXAMIC ACID-NACL 1000-0.7 MG/100ML-% IV SOLN
1000.0000 mg | INTRAVENOUS | Status: AC
  Administered 2021-06-20: 1000 mg via INTRAVENOUS
  Filled 2021-06-20: qty 100

## 2021-06-20 MED ORDER — BUPIVACAINE LIPOSOME 1.3 % IJ SUSP: 20.0000 mL | Freq: Once | INTRAMUSCULAR | Status: DC

## 2021-06-20 MED ORDER — ACETAMINOPHEN 325 MG PO TABS: 325.0000 mg | ORAL_TABLET | Freq: Four times a day (QID) | ORAL | Status: DC | PRN

## 2021-06-20 MED ORDER — ALUM & MAG HYDROXIDE-SIMETH 200-200-20 MG/5ML PO SUSP: 30.0000 mL | ORAL | Status: DC | PRN

## 2021-06-20 MED ORDER — BISACODYL 5 MG PO TBEC: 5.0000 mg | DELAYED_RELEASE_TABLET | Freq: Every day | ORAL | Status: DC | PRN

## 2021-06-20 MED ORDER — MENTHOL 3 MG MT LOZG: 1.0000 | LOZENGE | OROMUCOSAL | Status: DC | PRN

## 2021-06-20 MED ORDER — PHENYLEPHRINE HCL-NACL 20-0.9 MG/250ML-% IV SOLN
INTRAVENOUS | Status: DC | PRN
  Administered 2021-06-20: 50 ug/min via INTRAVENOUS

## 2021-06-20 MED ORDER — HYDROMORPHONE HCL 1 MG/ML IJ SOLN: 0.2500 mg | INTRAMUSCULAR | Status: DC | PRN

## 2021-06-20 MED ORDER — DOCUSATE SODIUM 100 MG PO CAPS
100.0000 mg | ORAL_CAPSULE | Freq: Two times a day (BID) | ORAL | Status: DC
  Administered 2021-06-20 – 2021-06-22 (×4): 100 mg via ORAL
  Filled 2021-06-20 (×4): qty 1

## 2021-06-20 MED ORDER — ASPIRIN 81 MG PO CHEW: 81.0000 mg | CHEWABLE_TABLET | Freq: Two times a day (BID) | ORAL | Status: DC

## 2021-06-20 MED ORDER — METOCLOPRAMIDE HCL 5 MG/ML IJ SOLN: 5.0000 mg | Freq: Three times a day (TID) | INTRAMUSCULAR | Status: DC | PRN

## 2021-06-20 MED ORDER — BUPIVACAINE-EPINEPHRINE 0.5% -1:200000 IJ SOLN
INTRAMUSCULAR | Status: DC | PRN
  Administered 2021-06-20: 30 mL

## 2021-06-20 MED ORDER — BUPIVACAINE IN DEXTROSE 0.75-8.25 % IT SOLN
INTRATHECAL | Status: DC | PRN
  Administered 2021-06-20: 1.7 mL via INTRATHECAL

## 2021-06-20 MED ORDER — ORAL CARE MOUTH RINSE: 15.0000 mL | Freq: Once | OROMUCOSAL | Status: AC

## 2021-06-20 MED ORDER — SODIUM CHLORIDE 0.9 % IR SOLN
Status: DC | PRN
  Administered 2021-06-20: 3000 mL

## 2021-06-20 MED ORDER — METHOCARBAMOL 500 MG PO TABS
500.0000 mg | ORAL_TABLET | Freq: Four times a day (QID) | ORAL | Status: DC | PRN
  Administered 2021-06-20 – 2021-06-22 (×5): 500 mg via ORAL
  Filled 2021-06-20 (×5): qty 1

## 2021-06-20 MED ORDER — 0.9 % SODIUM CHLORIDE (POUR BTL) OPTIME
TOPICAL | Status: DC | PRN
  Administered 2021-06-20: 1000 mL

## 2021-06-20 MED ORDER — OXYCODONE HCL 5 MG/5ML PO SOLN: 5.0000 mg | Freq: Once | ORAL | Status: DC | PRN

## 2021-06-20 MED ORDER — PHENOL 1.4 % MT LIQD: 1.0000 | OROMUCOSAL | Status: DC | PRN

## 2021-06-20 MED ORDER — LACTATED RINGERS IV SOLN: INTRAVENOUS | Status: DC

## 2021-06-20 MED ORDER — BUPIVACAINE-EPINEPHRINE (PF) 0.25% -1:200000 IJ SOLN
INTRAMUSCULAR | Status: AC
  Filled 2021-06-20: qty 30

## 2021-06-20 MED ORDER — BUPIVACAINE LIPOSOME 1.3 % IJ SUSP
INTRAMUSCULAR | Status: DC | PRN
  Administered 2021-06-20: 20 mL

## 2021-06-20 MED ORDER — PROPOFOL 1000 MG/100ML IV EMUL
INTRAVENOUS | Status: AC
  Filled 2021-06-20: qty 100

## 2021-06-20 MED ORDER — DIPHENHYDRAMINE HCL 12.5 MG/5ML PO ELIX: 12.5000 mg | ORAL_SOLUTION | ORAL | Status: DC | PRN

## 2021-06-20 MED ORDER — DEXAMETHASONE SODIUM PHOSPHATE 10 MG/ML IJ SOLN
INTRAMUSCULAR | Status: AC
  Filled 2021-06-20: qty 1

## 2021-06-20 MED ORDER — CEFAZOLIN SODIUM-DEXTROSE 2-4 GM/100ML-% IV SOLN
2.0000 g | Freq: Four times a day (QID) | INTRAVENOUS | Status: AC
  Administered 2021-06-20 – 2021-06-21 (×2): 2 g via INTRAVENOUS
  Filled 2021-06-20 (×2): qty 100

## 2021-06-20 MED ORDER — MIDAZOLAM HCL 2 MG/2ML IJ SOLN
INTRAMUSCULAR | Status: AC
  Administered 2021-06-20: 2 mg via INTRAVENOUS
  Filled 2021-06-20: qty 2

## 2021-06-20 MED ORDER — RIVAROXABAN 10 MG PO TABS
20.0000 mg | ORAL_TABLET | Freq: Every day | ORAL | Status: DC
  Administered 2021-06-21 – 2021-06-22 (×2): 20 mg via ORAL
  Filled 2021-06-20 (×2): qty 2

## 2021-06-20 MED ORDER — PHENYLEPHRINE HCL-NACL 20-0.9 MG/250ML-% IV SOLN
INTRAVENOUS | Status: AC
  Filled 2021-06-20: qty 250

## 2021-06-20 MED ORDER — CHLORHEXIDINE GLUCONATE 0.12 % MT SOLN
15.0000 mL | Freq: Once | OROMUCOSAL | Status: AC
  Administered 2021-06-20: 15 mL via OROMUCOSAL

## 2021-06-20 MED ORDER — MIDAZOLAM HCL 2 MG/2ML IJ SOLN: 1.0000 mg | Freq: Once | INTRAMUSCULAR | Status: AC

## 2021-06-20 MED ORDER — HYDROCODONE-ACETAMINOPHEN 7.5-325 MG PO TABS: 1.0000 | ORAL_TABLET | ORAL | Status: DC | PRN

## 2021-06-20 MED ORDER — LEVOTHYROXINE SODIUM 75 MCG PO TABS
150.0000 ug | ORAL_TABLET | Freq: Every day | ORAL | Status: DC
Start: 2021-06-21 — End: 2021-06-22
  Administered 2021-06-21 – 2021-06-22 (×2): 150 ug via ORAL
  Filled 2021-06-20 (×2): qty 2

## 2021-06-20 MED ORDER — ACETAMINOPHEN 500 MG PO TABS
500.0000 mg | ORAL_TABLET | Freq: Four times a day (QID) | ORAL | Status: AC
  Administered 2021-06-20: 500 mg via ORAL
  Filled 2021-06-20: qty 1

## 2021-06-20 MED ORDER — ONDANSETRON HCL 4 MG/2ML IJ SOLN
INTRAMUSCULAR | Status: AC
  Filled 2021-06-20: qty 2

## 2021-06-20 MED ORDER — ONDANSETRON HCL 4 MG PO TABS: 4.0000 mg | ORAL_TABLET | Freq: Four times a day (QID) | ORAL | Status: DC | PRN

## 2021-06-20 MED ORDER — HYDROCODONE-ACETAMINOPHEN 5-325 MG PO TABS
1.0000 | ORAL_TABLET | ORAL | Status: DC | PRN
  Administered 2021-06-20 – 2021-06-22 (×7): 2 via ORAL
  Filled 2021-06-20 (×7): qty 2

## 2021-06-20 MED ORDER — PROPOFOL 10 MG/ML IV BOLUS
INTRAVENOUS | Status: DC | PRN
  Administered 2021-06-20: 10 mg via INTRAVENOUS
  Administered 2021-06-20: 20 mg via INTRAVENOUS

## 2021-06-20 MED ORDER — MORPHINE SULFATE (PF) 2 MG/ML IV SOLN: 0.5000 mg | INTRAVENOUS | Status: DC | PRN

## 2021-06-20 MED ORDER — KETOROLAC TROMETHAMINE 15 MG/ML IJ SOLN
7.5000 mg | Freq: Four times a day (QID) | INTRAMUSCULAR | Status: AC
  Administered 2021-06-20 – 2021-06-21 (×4): 7.5 mg via INTRAVENOUS
  Filled 2021-06-20 (×4): qty 1

## 2021-06-20 MED ORDER — METOPROLOL TARTRATE 25 MG PO TABS
25.0000 mg | ORAL_TABLET | Freq: Two times a day (BID) | ORAL | Status: DC
Start: 2021-06-20 — End: 2021-06-22
  Administered 2021-06-20 – 2021-06-22 (×4): 25 mg via ORAL
  Filled 2021-06-20 (×4): qty 1

## 2021-06-20 MED ORDER — SODIUM CHLORIDE (PF) 0.9 % IJ SOLN
INTRAMUSCULAR | Status: AC
  Filled 2021-06-20: qty 30

## 2021-06-20 MED ORDER — ROPIVACAINE HCL 5 MG/ML IJ SOLN
INTRAMUSCULAR | Status: DC | PRN
  Administered 2021-06-20: 30 mL via PERINEURAL

## 2021-06-20 MED ORDER — PROPOFOL 500 MG/50ML IV EMUL
INTRAVENOUS | Status: DC | PRN
  Administered 2021-06-20: 130 ug/kg/min via INTRAVENOUS

## 2021-06-20 MED ORDER — POVIDONE-IODINE 10 % EX SWAB
2.0000 "application " | Freq: Once | CUTANEOUS | Status: AC
  Administered 2021-06-20: 2 via TOPICAL

## 2021-06-20 MED ORDER — OXYCODONE HCL 5 MG PO TABS: 5.0000 mg | ORAL_TABLET | Freq: Once | ORAL | Status: DC | PRN

## 2021-06-20 MED ORDER — TRANEXAMIC ACID-NACL 1000-0.7 MG/100ML-% IV SOLN
1000.0000 mg | Freq: Once | INTRAVENOUS | Status: AC
  Administered 2021-06-20: 1000 mg via INTRAVENOUS
  Filled 2021-06-20: qty 100

## 2021-06-20 MED ORDER — CEFAZOLIN SODIUM-DEXTROSE 2-4 GM/100ML-% IV SOLN
2.0000 g | INTRAVENOUS | Status: AC
  Administered 2021-06-20: 2 g via INTRAVENOUS
  Filled 2021-06-20: qty 100

## 2021-06-20 MED ORDER — ONDANSETRON HCL 4 MG/2ML IJ SOLN: 4.0000 mg | Freq: Once | INTRAMUSCULAR | Status: DC | PRN

## 2021-06-20 MED ORDER — FENTANYL CITRATE PF 50 MCG/ML IJ SOSY: 25.0000 ug | PREFILLED_SYRINGE | Freq: Once | INTRAMUSCULAR | Status: AC

## 2021-06-20 SURGICAL SUPPLY — 53 items
ATTUNE MED DOME PAT 41 KNEE (Knees) ×1 IMPLANT
ATTUNE PS FEM RT SZ 7 CEM KNEE (Femur) ×1 IMPLANT
ATTUNE PSRP INSR SZ7 10 KNEE (Insert) ×1 IMPLANT
BAG COUNTER SPONGE SURGICOUNT (BAG) ×2 IMPLANT
BAG DECANTER FOR FLEXI CONT (MISCELLANEOUS) ×2 IMPLANT
BAG ZIPLOCK 12X15 (MISCELLANEOUS) ×2 IMPLANT
BASE TIBIAL ROT PLAT SZ 8 KNEE (Knees) IMPLANT
BLADE SAGITTAL 25.0X1.19X90 (BLADE) ×2 IMPLANT
BLADE SAW SGTL 11.0X1.19X90.0M (BLADE) ×2 IMPLANT
BLADE SURG SZ10 CARB STEEL (BLADE) ×2 IMPLANT
BNDG ELASTIC 6X5.8 VLCR STR LF (GAUZE/BANDAGES/DRESSINGS) ×2 IMPLANT
BOOTIES KNEE HIGH SLOAN (MISCELLANEOUS) ×2 IMPLANT
BOWL SMART MIX CTS (DISPOSABLE) ×2 IMPLANT
CEMENT HV SMART SET (Cement) ×4 IMPLANT
COVER SURGICAL LIGHT HANDLE (MISCELLANEOUS) ×2 IMPLANT
CUFF TOURN SGL QUICK 34 (TOURNIQUET CUFF) ×1
CUFF TRNQT CYL 34X4.125X (TOURNIQUET CUFF) ×1 IMPLANT
DRAPE INCISE IOBAN 66X45 STRL (DRAPES) ×2 IMPLANT
DRAPE ORTHO 2.5IN SPLIT 77X108 (DRAPES) ×2 IMPLANT
DRAPE ORTHO SPLIT 77X108 STRL (DRAPES) ×2
DRAPE SHEET LG 3/4 BI-LAMINATE (DRAPES) ×2 IMPLANT
DRAPE U-SHAPE 47X51 STRL (DRAPES) ×2 IMPLANT
DRSG AQUACEL AG ADV 3.5X10 (GAUZE/BANDAGES/DRESSINGS) ×2 IMPLANT
DURAPREP 26ML APPLICATOR (WOUND CARE) ×4 IMPLANT
ELECT REM PT RETURN 15FT ADLT (MISCELLANEOUS) ×2 IMPLANT
GLOVE BIO SURGEON STRL SZ8 (GLOVE) ×4 IMPLANT
GLOVE BIOGEL PI IND STRL 8 (GLOVE) ×2 IMPLANT
GLOVE BIOGEL PI INDICATOR 8 (GLOVE) ×2
GOWN STRL REUS W/ TWL XL LVL3 (GOWN DISPOSABLE) ×2 IMPLANT
GOWN STRL REUS W/TWL XL LVL3 (GOWN DISPOSABLE) ×2
HANDPIECE INTERPULSE COAX TIP (DISPOSABLE) ×1
HOLDER FOLEY CATH W/STRAP (MISCELLANEOUS) ×1 IMPLANT
HOOD PEEL AWAY FLYTE STAYCOOL (MISCELLANEOUS) ×6 IMPLANT
KIT TURNOVER KIT A (KITS) IMPLANT
MANIFOLD NEPTUNE II (INSTRUMENTS) ×2 IMPLANT
NEEDLE HYPO 22GX1.5 SAFETY (NEEDLE) ×2 IMPLANT
NS IRRIG 1000ML POUR BTL (IV SOLUTION) ×2 IMPLANT
PACK TOTAL KNEE CUSTOM (KITS) ×2 IMPLANT
PAD ARMBOARD 7.5X6 YLW CONV (MISCELLANEOUS) ×2 IMPLANT
PROTECTOR NERVE ULNAR (MISCELLANEOUS) ×2 IMPLANT
SET HNDPC FAN SPRY TIP SCT (DISPOSABLE) ×1 IMPLANT
SPIKE FLUID TRANSFER (MISCELLANEOUS) ×2 IMPLANT
SUT ETHIBOND NAB CT1 #1 30IN (SUTURE) ×4 IMPLANT
SUT VIC AB 0 CT1 36 (SUTURE) ×2 IMPLANT
SUT VIC AB 2-0 CT1 27 (SUTURE) ×1
SUT VIC AB 2-0 CT1 TAPERPNT 27 (SUTURE) ×1 IMPLANT
SUT VICRYL AB 3-0 FS1 BRD 27IN (SUTURE) ×2 IMPLANT
SUT VLOC 180 0 24IN GS25 (SUTURE) ×2 IMPLANT
TIBIAL BASE ROT PLAT SZ 8 KNEE (Knees) ×2 IMPLANT
TRAY FOLEY MTR SLVR 16FR STAT (SET/KITS/TRAYS/PACK) ×1 IMPLANT
WATER STERILE IRR 1000ML POUR (IV SOLUTION) ×3 IMPLANT
WRAP KNEE MAXI GEL POST OP (GAUZE/BANDAGES/DRESSINGS) ×2 IMPLANT
YANKAUER SUCT BULB TIP NO VENT (SUCTIONS) ×2 IMPLANT

## 2021-06-20 NOTE — Anesthesia Procedure Notes (Signed)
Spinal ? ?Patient location during procedure: OR ?Start time: 06/20/2021 12:05 PM ?End time: 06/20/2021 12:08 PM ?Reason for block: surgical anesthesia ?Staffing ?Performed: resident/CRNA  ?Anesthesiologist: Josephine Igo, MD ?Resident/CRNA: Lind Covert, CRNA ?Preanesthetic Checklist ?Completed: patient identified, IV checked, site marked, risks and benefits discussed, surgical consent, monitors and equipment checked, pre-op evaluation and timeout performed ?Spinal Block ?Patient position: sitting ?Prep: DuraPrep ?Patient monitoring: heart rate, cardiac monitor, continuous pulse ox and blood pressure ?Approach: midline ?Location: L3-4 ?Injection technique: single-shot ?Needle ?Needle type: Pencan  ?Needle gauge: 24 G ?Needle length: 10 cm ?Needle insertion depth: 8 cm ?Assessment ?Sensory level: T6 ?Events: CSF return ?Additional Notes ?Timeout performed. Patient in sitting position.L3-4 identified. 2 attempts. SAB without difficulty on 2nd attempt. ? ? ? ?

## 2021-06-20 NOTE — Anesthesia Procedure Notes (Signed)
Anesthesia Regional Block: Adductor canal block  ? ?Pre-Anesthetic Checklist: , timeout performed,  Correct Patient, Correct Site, Correct Laterality,  Correct Procedure, Correct Position, site marked,  Risks and benefits discussed,  Surgical consent,  Pre-op evaluation,  At surgeon's request and post-op pain management ? ?Laterality: Right ? ?Prep: chloraprep     ?  ?Needles:  ?Injection technique: Single-shot ? ?Needle Type: Echogenic Stimulator Needle   ? ? ?Needle Length: 10cm  ?Needle Gauge: 21  ? ?Needle insertion depth: 7 cm ? ? ?Additional Needles: ? ? ?Narrative:  ?Start time: 06/20/2021 11:45 AM ?End time: 06/20/2021 11:50 AM ?Injection made incrementally with aspirations every 5 mL. ? ?Performed by: Personally  ?Anesthesiologist: Josephine Igo, MD ? ?Additional Notes: ?Timeout performed. Patient sedated. Relevant anatomy ID'd using [place]. Incremental 2-60m injection of LA with frequent aspiration. Patient tolerated procedure well. ? ? ? ?Right Adductor Canal Block ? ?

## 2021-06-20 NOTE — Interval H&P Note (Signed)
History and Physical Interval Note: ? ?06/20/2021 ?10:57 AM ? ?Darren Harrison  has presented today for surgery, with the diagnosis of RIGHT KNEE DEGENERATIVE JOINT DISEASE.  The various methods of treatment have been discussed with the patient and family. After consideration of risks, benefits and other options for treatment, the patient has consented to  Procedure(s): ?RIGHT TOTAL KNEE ARTHROPLASTY (Right) as a surgical intervention.  The patient's history has been reviewed, patient examined, no change in status, stable for surgery.  I have reviewed the patient's chart and labs.  Questions were answered to the patient's satisfaction.   ? ? ?Darren Harrison ? ? ?

## 2021-06-20 NOTE — Evaluation (Signed)
Physical Therapy Evaluation ?Patient Details ?Name: Darren Harrison ?MRN: [id number] ?DOB: 01-06-1956 ?Today's Date: 06/20/2021 ? ?History of Present Illness ? Pt is a 66yo male presenting s/p R-TKA on 06/20/21. PMH: uses WC at baseline, cervical myelopathy, GERD, HTN, hypothyroidism, cervical C3-C4 & C5-C6 fusion, lumbar L2-L3 & L3-L4 & L4-L5 laminectomy. ?  ?Clinical Impression ? Darren Harrison is a 67 y.o. male POD 0 s/p R-TKA. Pt has had a long-term goal of achieving this knee replacement and is very excited he was able to undergo the procedure. Patient reports modified independence using WC at baseline, though last week during OPPT he walked within the parallel bars. Patient requires min assist for transfers and gait with RW. Patient was able to ambulate 5 feet with RW and min assist with close recliner follow for safety; reported his arms fatigued quickly and demonstrated weakness in BLEs. Patient instructed in exercise to facilitate ROM and circulation to manage edema. Patient will benefit from continued skilled PT interventions to address impairments and progress towards PLOF. Acute PT will follow to progress mobility in preparation for safe discharge home.   ?   ? ?Recommendations for follow up therapy are one component of a multi-disciplinary discharge planning process, led by the attending physician.  Recommendations may be updated based on patient status, additional functional criteria and insurance authorization. ? ?Follow Up Recommendations Follow physician's recommendations for discharge plan and follow up therapies ? ?  ?Assistance Recommended at Discharge Intermittent Supervision/Assistance  ?Patient can return home with the following ? Help with stairs or ramp for entrance;Assist for transportation;Assistance with cooking/housework;A little help with bathing/dressing/bathroom;A lot of help with walking and/or transfers ? ?  ?Equipment Recommendations Other (comment) (Pt would benefit from [facility] legrest  that allows him to elevate his RLE with leg extended.)  ?Recommendations for Other Services ?    ?  ?Functional Status Assessment Patient has had a recent decline in their functional status and demonstrates the ability to make significant improvements in function in a reasonable and predictable amount of time.  ? ?  ?Precautions / Restrictions Precautions ?Precautions: Fall ?Precaution Comments: Pt was previously WC bound and just last week started walking in the // bars at [place]. ?Restrictions ?Weight Bearing Restrictions: No  ? ?  ? ?Mobility ? Bed Mobility ?Overal bed mobility: Needs Assistance ?Bed Mobility: Supine to Sit ?  ?  ?Supine to sit: Min guard ?  ?  ?General bed mobility comments: Pt min guard for safety, no physical assist required. ?  ? ?Transfers ?Overall transfer level: Needs assistance ?Equipment used: Rolling walker (2 wheels) ?Transfers: Sit to/from Stand ?Sit to Stand: From elevated surface, Min assist ?  ?  ?  ?  ?  ?General transfer comment: Pt min assist for stabilizing RW, otherwise min guard for sit to stand transfer from elevated surface. ?  ? ?Ambulation/Gait ?Ambulation/Gait assistance: +2 safety/equipment, Min assist ?Gait Distance (Feet): 5 Feet ?Assistive device: Rolling walker (2 wheels) ?Gait Pattern/deviations: Step-to pattern, Decreased stance time - right, Decreased dorsiflexion - right, Decreased weight shift to right ?Gait velocity: decreased ?  ?  ?General Gait Details: Pt ambulated from EOB toward door with min assist for steadying and advancement of RW, recliner follow for safety. Pt demonstrated heavy BUE use on RW and weight shifted anteriorly. No overt LOB. Pt reporting fatigue after walking, but very excited that he had walked that far with a RW. ? ?Stairs ?  ?  ?  ?  ?  ? ?Wheelchair Mobility ?  ? ?  Modified Rankin (Stroke Patients Only) ?  ? ?  ? ?Balance Overall balance assessment: Needs assistance ?Sitting-balance support: Feet supported, No upper extremity  supported ?Sitting balance-Leahy Scale: Fair ?  ?  ?Standing balance support: Bilateral upper extremity supported, During functional activity, Reliant on assistive device for balance ?Standing balance-Leahy Scale: Poor ?Standing balance comment: Pt unable to stand for more than ~86mn, reporting arms fatiguing. ?  ?  ?  ?  ?  ?  ?  ?  ?  ?  ?  ?   ? ? ? ?Pertinent Vitals/Pain Pain Assessment ?Pain Assessment: No/denies pain  ? ? ?Home Living Family/patient expects to be discharged to:: Private residence ?Living Arrangements: Spouse/significant other ?Available Help at Discharge: Family;Available 24 hours/day (Wife off this week) ?Type of Home: House ?Home Access: Level entry ?  ?  ?Alternate Level Stairs-Number of Steps: full flight ?Home Layout: Two level;Able to live on main level with bedroom/bathroom (doesn't go upstairs) ?Home Equipment: Toilet riser;Grab bars - toilet;Grab bars - tub/shower;Wheelchair - mPublishing copy(2 wheels);Shower seat ?   ?  ?Prior Function Prior Level of Function : Independent/Modified Independent ?  ?  ?  ?  ?  ?  ?Mobility Comments: Uses WC for all mobility at baseline excepting last week he started taking steps in the // bars at OPPT and during aquatic therapy. ?ADLs Comments: wife helps with getting out of the shower, supervision for into shower. ?  ? ? ?Hand Dominance  ? Dominant Hand: Right ? ?  ?Extremity/Trunk Assessment  ? Upper Extremity Assessment ?Upper Extremity Assessment: Overall WFL for tasks assessed ?  ? ?Lower Extremity Assessment ?Lower Extremity Assessment: RLE deficits/detail;LLE deficits/detail ?RLE Deficits / Details: MMT ank pf/df 5/5, no extensor lag noted ?RLE Sensation: WNL ?LLE Deficits / Details: MMT ank pf/df 5/5 ?LLE Sensation: WNL ?  ? ?Cervical / Trunk Assessment ?Cervical / Trunk Assessment: Back Surgery;Neck Surgery  ?Communication  ?    ?Cognition Arousal/Alertness: Awake/alert ?Behavior [facility]: [facility]formed ?Overall  Cognitive Status: Within Functional Limits for tasks assessed ?  ?  ?  ?  ?  ?  ?  ?  ?  ?  ?  ?  ?  ?  ?  ?  ?  ?  ?  ? ?  ?General Comments General comments (skin integrity, edema, etc.): Pt highly motivated to participate in therapy and reports this surgery has been a goal for many years. ? ?  ?Exercises Total Joint Exercises ?Ankle Circles/Pumps: AROM, Both, 10 reps  ? ?Assessment/Plan  ?  ?PT Assessment Patient needs continued PT services  ?PT Problem List Decreased strength;Decreased range of motion;Decreased activity tolerance;Decreased balance;Decreased mobility;Decreased coordination;Pain ? ?   ?  ?PT Treatment Interventions DME instruction;Gait training;Functional mobility training;Therapeutic activities;Therapeutic exercise;Balance training;Stair training;Neuromuscular re-education;Patient/family education   ? ?PT Goals (Current goals can be found in the Care Plan section)  ?Acute Rehab PT Goals ?Patient Stated Goal: [facility] try to walk again, play golf ?PT Goal Formulation: With patient ?Time For Goal Achievement: 06/27/21 ?Potential to Achieve Goals: Good ? ?  ?Frequency 7X/week ?  ? ? ?Co-evaluation   ?  ?  ?  ?  ? ? ?  ?AM-PAC PT "6 Clicks" Mobility  ?Outcome Measure Help needed turning from your back to your side while in a flat bed without using bedrails?: None ?Help needed moving from lying on your back to sitting on the side of a flat bed without using  bedrails?: A Little ?Help needed moving to and from a bed to a chair (including a wheelchair)?: A Little ?Help needed standing up from a chair using your arms (e.g., wheelchair or bedside chair)?: A Lot ?Help needed to walk in hospital room?: A Lot ?Help needed climbing 3-5 steps with a railing? : A Lot ?6 Click Score: 16 ? ?  ?End of Session Equipment Utilized During Treatment: Gait belt ?Activity Tolerance: No increased pain;Patient tolerated treatment well ?Patient left: in chair;with call bell/phone within reach;with chair alarm  set;with family/visitor present;with SCD's reapplied ?Nurse Communication: Mobility status ?PT Visit Diagnosis: Difficulty in walking, not elsewhere classified (R26.2);Unsteadiness on feet (R26.81) ?  ? ?Time: 1645-1

## 2021-06-20 NOTE — Anesthesia Preprocedure Evaluation (Signed)
Anesthesia Evaluation  ?Patient identified by MRN, date of birth, ID band ?Patient awake ? ? ? ?Reviewed: ?Allergy & Precautions, NPO status , Patient's Chart, lab work & pertinent test results, reviewed documented beta blocker date and time  ? ?Airway ?Mallampati: II ? ?TM Distance: >3 FB ?Neck ROM: Full ? ? ? Dental ? ?(+) Dental Advisory Given, Caps, Missing ?  ?Pulmonary ?neg pulmonary ROS,  ?  ?Pulmonary exam normal ?breath sounds clear to auscultation ? ? ? ? ? ? Cardiovascular ?hypertension, Pt. on medications and Pt. on home beta blockers ?+ dysrhythmias Atrial Fibrillation  ?Rhythm:Regular Rate:Normal ? ? ?  ?Neuro/Psych ?Hc/o Quadriplegia due to cervical stenosis S/P Cervical laminectomies ?negative psych ROS  ? GI/Hepatic ?GERD  Medicated,(+) Cirrhosis  ?  ?  ? , Portal HTN ?  ?Endo/Other  ?Hypothyroidism Hyperthyroidism Obesity ?Hx/o Grave's disease S/P RAI therapy ? Renal/GU ?negative Renal ROS  ? ?BPH ? ?  ?Musculoskeletal ? ?(+) Arthritis , Osteoarthritis,  DJD right hip ?S/P Cervical fusion, discectomy and foraminotomy ?S/P lumbar laminectomies  ? Abdominal ?(+) + obese,   ?Peds ? Hematology ?Xarelto therapy- last dose   ?Anesthesia Other Findings ? ? Reproductive/Obstetrics ? ?  ? ? ? ? ? ? ? ? ? ? ? ? ? ?  ?  ? ? ? ? ? ? ? ? ?Anesthesia Physical ?Anesthesia Plan ? ?ASA: 3 ? ?Anesthesia Plan: Spinal  ? ?Post-op Pain Management:   ? ?Induction: Intravenous ? ?PONV Risk Score and Plan: 2 and Propofol infusion, Treatment may vary due to age or medical condition and Ondansetron ? ?Airway Management Planned: Natural Airway and Simple Face Mask ? ?Additional Equipment:  ? ?Intra-op Plan:  ? ?Post-operative Plan:  ? ?Informed Consent: I have reviewed the patients History and Physical, chart, labs and discussed the procedure including the risks, benefits and alternatives for the proposed anesthesia with the patient or authorized representative who has indicated his/her  understanding and acceptance.  ? ? ? ?Dental advisory given ? ?Plan Discussed with: Anesthesiologist and CRNA ? ?Anesthesia Plan Comments:   ? ? ? ? ? ? ?Anesthesia Quick Evaluation ? ?

## 2021-06-20 NOTE — Op Note (Signed)
PREOP DIAGNOSIS: DJD RIGHT KNEE ?POSTOP DIAGNOSIS: same ?PROCEDURE: RIGHT TKR ?ANESTHESIA: Spinal and MAC ?ATTENDING SURGEON: [name] ?ASSISTANT: Loni Dolly PA ? ?INDICATIONS FOR PROCEDURE: Darren Harrison is a 66 y.o. male who has struggled for a long time with pain due to degenerative arthritis of the right knee.  The patient has failed many conservative non-operative measures and at this point has pain which limits the ability to sleep and walk.  The patient is offered total knee replacement.  Informed operative consent was obtained after discussion of possible risks of anesthesia, infection, neurovascular injury, DVT, and death.  The importance of the post-operative rehabilitation protocol to optimize result was stressed extensively with the patient. ? ?SUMMARY OF FINDINGS AND PROCEDURE:  Darren Harrison was taken to the operative suite where under the above anesthesia a right knee replacement was performed.  There were advanced degenerative changes and the bone quality was poor.  We used the DePuy Attune system and placed size 7 femur, 8 tibia, 41 mm all polyethylene patella, and a size 10 mm spacer.  Loni Dolly PA-C assisted throughout and was invaluable to the completion of the case in that he helped retract and maintain exposure while I placed components.  He also helped close thereby minimizing OR time.  The patient was admitted for appropriate post-op care to include perioperative antibiotics and mechanical and pharmacologic measures for DVT prophylaxis. ? ?DESCRIPTION OF PROCEDURE:  Darren Harrison was taken to the operative suite where the above anesthesia was applied.  The patient was positioned supine and prepped and draped in normal sterile fashion.  An appropriate time out was performed.  After the administration of kefzol pre-op antibiotic the leg was elevated and exsanguinated and a tourniquet inflated. A standard longitudinal incision was made on the anterior knee.  Dissection was  carried down to the extensor mechanism.  All appropriate anti-infective measures were used including the pre-operative antibiotic, betadine impregnated drape, and closed hooded exhaust systems for each member of the surgical team.  A medial parapatellar incision was made in the extensor mechanism and the knee cap flipped and the knee flexed.  Some residual meniscal tissues were removed along with any remaining ACL/PCL tissue.  A guide was placed on the tibia and a flat cut was made on it's superior surface.  An intramedullary guide was placed in the femur and was utilized to make anterior and posterior cuts creating an appropriate flexion gap.  A second intramedullary guide was placed in the femur to make a distal cut properly balancing the knee with an extension gap equal to the flexion gap.  The three bones sized to the above mentioned sizes and the appropriate guides were placed and utilized.  A trial reduction was done and the knee easily came to full extension and the patella tracked well on flexion.  The trial components were removed and all bones were cleaned with pulsatile lavage and then dried thoroughly.  Cement was mixed and was pressurized onto the bones followed by placement of the aforementioned components.  Excess cement was trimmed and pressure was held on the components until the cement had hardened.  The tourniquet was deflated and a small amount of bleeding was controlled with cautery and pressure.  The knee was irrigated thoroughly.  The extensor mechanism was re-approximated with #1 ethibond in interrupted fashion.  The knee was flexed and the repair was solid.  The subcutaneous tissues were re-approximated with #0 and #2-0 vicryl and the skin closed with  a subcuticular stitch and steristrips.  A sterile dressing was applied.  Intraoperative fluids, EBL, and tourniquet time can be obtained from anesthesia records. ? ?DISPOSITION:  The patient was taken to recovery room in stable condition and  scheduled to potentially go home same day depending on ability to walk and tolerate liquids.. ? ?Darren Harrison Darren Harrison ?06/20/2021, 2:01 PM   ?

## 2021-06-20 NOTE — Transfer of Care (Signed)
Immediate Anesthesia Transfer of Care Note ? ?Patient: Darren Harrison ? ?Procedure(s) Performed: RIGHT TOTAL KNEE ARTHROPLASTY (Right: Knee) ? ?Patient Location: PACU ? ?Anesthesia Type:Spinal ? ?Level of Consciousness: sedated ? ?Airway & Oxygen Therapy: Patient Spontanous Breathing and Patient connected to face mask oxygen ? ?Post-op Assessment: Report given to RN and Post -op Vital signs reviewed and stable ? ?Post vital signs: Reviewed and stable ? ?Last Vitals:  ?Vitals Value Taken Time  ?BP 110/78 06/20/21 1428  ?Temp    ?Pulse 65 06/20/21 1430  ?Resp 15 06/20/21 1430  ?SpO2 99 % 06/20/21 1430  ?Vitals shown include unvalidated device data. ? ?Last Pain:  ?Vitals:  ? 06/20/21 1047  ?TempSrc:   ?PainSc: 0-No pain  ?   ? ?Patients Stated Pain Goal: 3 (06/20/21 1047) ? ?Complications: No notable events documented. ?

## 2021-06-20 NOTE — Anesthesia Postprocedure Evaluation (Signed)
Anesthesia Post Note ? ?Patient: Darren Harrison ? ?Procedure(s) Performed: RIGHT TOTAL KNEE ARTHROPLASTY (Right: Knee) ? ?  ? ?Patient location during evaluation: PACU ?Anesthesia Type: Spinal ?Level of consciousness: oriented and awake and alert ?Pain management: pain level controlled ?Vital Signs Assessment: post-procedure vital signs reviewed and stable ?Respiratory status: spontaneous breathing, respiratory function stable and nonlabored ventilation ?Cardiovascular status: blood pressure returned to baseline and stable ?Postop Assessment: no headache, no backache, no apparent nausea or vomiting and spinal receding ?Anesthetic complications: no ? ? ?No notable events documented. ? ?Last Vitals:  ?Vitals:  ? 06/20/21 1150 06/20/21 1430  ?BP: (!) 127/92 (!) 115/99  ?Pulse: (!) 58 66  ?Resp: 19 17  ?Temp:    ?SpO2: (!) 88% 98%  ?  ?Last Pain:  ?Vitals:  ? 06/20/21 1430  ?TempSrc:   ?PainSc: 0-No pain  ? ? ?  ?  ?  ?  ?  ?  ? ?[name] A. ? ? ? ? ?

## 2021-06-21 ENCOUNTER — Encounter (HOSPITAL_COMMUNITY): Payer: Self-pay | Admitting: Orthopaedic Surgery

## 2021-06-21 DIAGNOSIS — Z8349 Family history of other endocrine, nutritional and metabolic diseases: Secondary | ICD-10-CM | POA: Diagnosis not present

## 2021-06-21 DIAGNOSIS — M1711 Unilateral primary osteoarthritis, right knee: Secondary | ICD-10-CM | POA: Diagnosis present

## 2021-06-21 DIAGNOSIS — Z87891 Personal history of nicotine dependence: Secondary | ICD-10-CM | POA: Diagnosis not present

## 2021-06-21 DIAGNOSIS — Z7901 Long term (current) use of anticoagulants: Secondary | ICD-10-CM | POA: Diagnosis not present

## 2021-06-21 DIAGNOSIS — N400 Enlarged prostate without lower urinary tract symptoms: Secondary | ICD-10-CM | POA: Diagnosis present

## 2021-06-21 DIAGNOSIS — Z833 Family history of diabetes mellitus: Secondary | ICD-10-CM | POA: Diagnosis not present

## 2021-06-21 DIAGNOSIS — Z8249 Family history of ischemic heart disease and other diseases of the circulatory system: Secondary | ICD-10-CM | POA: Diagnosis not present

## 2021-06-21 DIAGNOSIS — I10 Essential (primary) hypertension: Secondary | ICD-10-CM | POA: Diagnosis present

## 2021-06-21 DIAGNOSIS — E890 Postprocedural hypothyroidism: Secondary | ICD-10-CM | POA: Diagnosis present

## 2021-06-21 DIAGNOSIS — Z79899 Other long term (current) drug therapy: Secondary | ICD-10-CM | POA: Diagnosis not present

## 2021-06-21 DIAGNOSIS — Z981 Arthrodesis status: Secondary | ICD-10-CM | POA: Diagnosis not present

## 2021-06-21 DIAGNOSIS — Z96659 Presence of unspecified artificial knee joint: Secondary | ICD-10-CM

## 2021-06-21 DIAGNOSIS — K219 Gastro-esophageal reflux disease without esophagitis: Secondary | ICD-10-CM | POA: Diagnosis present

## 2021-06-21 DIAGNOSIS — I4891 Unspecified atrial fibrillation: Secondary | ICD-10-CM | POA: Diagnosis present

## 2021-06-21 DIAGNOSIS — Z79890 Hormone replacement therapy (postmenopausal): Secondary | ICD-10-CM | POA: Diagnosis not present

## 2021-06-21 LAB — BASIC METABOLIC PANEL
Anion gap: 8 (ref 5–15)
BUN: 23 mg/dL (ref 8–23)
CO2: 26 mmol/L (ref 22–32)
Calcium: 8.7 mg/dL — ABNORMAL LOW (ref 8.9–10.3)
Chloride: 103 mmol/L (ref 98–111)
Creatinine, Ser: 0.8 mg/dL (ref 0.61–1.24)
GFR, Estimated: >60 mL/min (ref >60)
Glucose, Bld: 127 mg/dL — ABNORMAL HIGH (ref 70–99)
Potassium: 4.3 mmol/L (ref 3.5–5.1)
Sodium: 137 mmol/L (ref 135–145)

## 2021-06-21 LAB — CBC
HCT: 32.1 % — ABNORMAL LOW (ref 39.0–52.0)
Hemoglobin: 11 g/dL — ABNORMAL LOW (ref 13.0–17.0)
MCH: 32.5 pg (ref 26.0–34.0)
MCHC: 34.3 g/dL (ref 30.0–36.0)
MCV: 95 fL (ref 80.0–100.0)
Platelets: 136 10*3/uL — ABNORMAL LOW (ref 150–400)
RBC: 3.38 MIL/uL — ABNORMAL LOW (ref 4.22–5.81)
RDW: 12.9 % (ref 11.5–15.5)
WBC: 10 10*3/uL (ref 4.0–10.5)
nRBC: 0 % (ref 0.0–0.2)

## 2021-06-21 MED ORDER — OXYCODONE HCL 5 MG PO TABS: 5.0000 mg | ORAL_TABLET | Freq: Four times a day (QID) | ORAL | 0 refills | Status: DC | PRN

## 2021-06-21 NOTE — Progress Notes (Signed)
Physical Therapy Treatment ?Patient Details ?Name: Darren Harrison ?MRN: [id number] ?DOB: March 28, 1955 ?Today's Date: 06/21/2021 ? ? ?History of Present Illness Pt is a 66yo male presenting s/p R-TKA on 06/20/21. PMH: uses WC at baseline, cervical myelopathy, GERD, HTN, hypothyroidism, cervical C3-C4 & C5-C6 fusion, lumbar L2-L3 & L3-L4 & L4-L5 laminectomy. ? ?  ?PT Comments  ? ? Pt seen for first of two sessions POD1.  ?Pt reporting increased pain last night but no pain currently. Required min guard for bed mobility, min assist for transfers, and min assist +2 for ambulation with recliner follow, 89f. During ambulation pt demonstrated R knee buckling but was able to self-correct. Provided HEP and pt completed all exercises safely with cuing. Answered questions to pt and wife's satisfaction. We will continue to work with him acutely to promote safe discharge home.  ?   ?Recommendations for follow up therapy are one component of a multi-disciplinary discharge planning process, led by the attending physician.  Recommendations may be updated based on patient status, additional functional criteria and insurance authorization. ? ?Follow Up Recommendations ? Follow physician's recommendations for discharge plan and follow up therapies ?  ?  ?Assistance Recommended at Discharge Intermittent Supervision/Assistance  ?Patient can return home with the following Help with stairs or ramp for entrance;Assist for transportation;Assistance with cooking/housework;A little help with bathing/dressing/bathroom;A lot of help with walking and/or transfers ?  ?Equipment Recommendations ? Other (comment) ([facility] his RLE with leg extended.)  ?  ?Recommendations for Other Services   ? ? ?  ?Precautions / Restrictions Precautions ?Precautions: Fall ?Precaution Comments: Pt was previously WC bound and just [place] ?Restrictions ?Weight Bearing  Restrictions: No  ?  ? ?Mobility ? Bed Mobility ?Overal bed mobility: Needs Assistance ?Bed Mobility: Supine to Sit ?  ?  ?Supine to sit: Min guard ?  ?  ?General bed mobility comments: Pt min guard for safety, no physical assist required. ?  ? ?Transfers ?Overall transfer level: Needs assistance ?Equipment used: Rolling walker (2 wheels) ?Transfers: Sit to/from Stand ?Sit to Stand: From elevated surface, Min assist ?  ?  ?  ?  ?  ?General transfer comment: Pt min assist for stabilizing RW and lift assist, otherwise min guard for sit to stand transfer from elevated surface. ?  ? ?Ambulation/Gait ?Ambulation/Gait assistance: +2 safety/equipment, Min assist ?Gait Distance (Feet): 18 Feet ?Assistive device: Rolling walker (2 wheels) ?Gait Pattern/deviations: Step-to pattern, Decreased stance time - right, Decreased dorsiflexion - right, Decreased weight shift to right, Knee flexed in stance - right ?Gait velocity: decreased ?  ?  ?General Gait Details: Pt ambulated with RW and min assist for steadying of RW and stabilizing of R knee as it was mildly buckling, as ambulation advanced buckling decreased and reduced assist to min guard of R knee, +2 for safety. Recliner follow for safety. Pt demonstrated heavy BUE use on RW and weight shifted anteriorly. No overt LOB. Pt reporting fatigue after walking, but very excited that he had walked that far with a RW. ? ? ?Stairs ?  ?  ?  ?  ?  ? ? ?Wheelchair Mobility ?  ? ?Modified Rankin (Stroke Patients Only) ?  ? ? ?  ?Balance Overall balance assessment: Needs assistance ?Sitting-balance support: Feet supported, No upper extremity supported ?Sitting balance-Leahy Scale: Fair ?  ?  ?Standing balance support: Bilateral upper extremity supported, During functional activity, Reliant on  assistive device for balance ?Standing balance-Leahy Scale: Poor ?  ?  ?  ?  ?  ?  ?  ?  ?  ?  ?  ?  ?  ? ?  ?Cognition Arousal/Alertness: Awake/alert ?Behavior During Therapy: [facility] for tasks  assessed/performed ?Overall Cognitive Status: Within Functional Limits for tasks assessed ?  ?  ?  ?  ?  ?  ?  ?  ?  ?  ?  ?  ?  ?  ?  ?  ?  ?  ?  ? ?  ?Exercises Total Joint Exercises ?Ankle Circles/Pumps: AROM, Both, 10 reps ?Quad Sets: AROM, Both, 10 reps ?Short Arc Quad: AROM, Both, 5 reps ?Heel Slides: AROM, Both, 5 reps ?Hip ABduction/ADduction: AROM, Both, 5 reps ?Straight Leg Raises: AROM, Both, 5 reps ? ?  ?General Comments General comments (skin integrity, edema, etc.): VSS. Pt on O2 at start of session, removed for session, SpO2 within normal limits, returned to 2LO2 via [place] as SpO2 was 90% at exit. ?  ?  ? ?Pertinent Vitals/Pain    ? ? ?Home Living   ?  ?  ?  ?  ?  ?  ?  ?  ?  ?   ?  ?Prior Function    ?  ?  ?   ? ?PT Goals (current goals can now be found in the care plan section) Acute Rehab PT Goals ?Patient Stated Goal: get out of the [facility], try to walk again, play golf ?PT Goal Formulation: With patient ?Time For Goal Achievement: 06/27/21 ?Potential to Achieve Goals: Good ?Progress towards PT goals: Progressing toward goals ? ?  ?Frequency ? ? ? 7X/week ? ? ? ?  ?PT Plan Current plan remains appropriate  ? ? ?Co-evaluation   ?  ?  ?  ?  ? ?  ?AM-PAC PT "6 Clicks" Mobility   ?Outcome Measure ? Help needed turning from your back to your side while in a flat bed without using bedrails?: None ?Help needed moving from lying on your back to sitting on the side of a flat bed without using bedrails?: A Little ?Help needed moving to and from a bed to a chair (including a wheelchair)?: A Little ?Help needed standing up from a chair using your arms (e.g., wheelchair or bedside chair)?: A Lot ?Help needed to walk in hospital room?: A Lot ?Help needed climbing 3-5 steps with a railing? : A Lot ?6 Click Score: 16 ? ?  ?End of Session Equipment Utilized During Treatment: Gait belt ?Activity Tolerance: No increased pain;Patient tolerated treatment well ?Patient left: in chair;with call bell/phone within reach;with  chair alarm set;with family/visitor present ?Nurse Communication: Mobility status ?PT Visit Diagnosis: Difficulty in walking, not elsewhere classified (R26.2);Unsteadiness on feet (R26.81) ?  ? ? ?Time: 1120-1200 ?PT Time Calculation (min) (ACUTE ONLY): 40 min ? ?Charges:  $Gait Training: 23-37 mins ?$Therapeutic Exercise: 8-22 mins          ?          ? ?Coolidge Breeze, PT, DPT ?[facility] ?Office: [phone] ?Pager: [phone] ? ? ?Coolidge Breeze ?06/21/2021, 12:46 PM ? ?

## 2021-06-21 NOTE — Plan of Care (Signed)

## 2021-06-21 NOTE — Progress Notes (Signed)
Physical Therapy Treatment ?Patient Details ?Name: Darren Harrison ?MRN: [id number] ?DOB: [birth year] ?Today's Date: 06/21/2021 ? ? ?History of Present Illness Pt is a 66yo male presenting s/p R-TKA on 06/20/21. PMH: uses WC at baseline, cervical myelopathy, GERD, HTN, hypothyroidism, cervical C3-C4 & C5-C6 fusion, lumbar L2-L3 & L3-L4 & L4-L5 laminectomy. ? ?  ?PT Comments  ? ? Pt seen for second of two visits today POD1.  ?Pt has been WC bound until last week and so is very deconditioned especially in ambulation tasks but highly motivated to work with therapy, wife is very helpful and has been providing his assist at home.  ?Pt required min assist for lift assist for sit to stand transfer from recliner as well as extra time to allow for full upright standing. Pt min assist +2 for ambulation with RW for short distances, PT provided min assist to R knee to prevent buckling as well as steadying support, close recliner follow as pt began to demonstrate scissoring gait at end of ambulation task. Educated pt about pacing of activities upon discharge and to slowly work up to increased time in standing. Recommended pt pursue elevating legrest with [employer] as his WC has only dependent legrest, provided "ad-hoc" ideas if cost/time becomes an issue. Pt reporting he feels comfortable with WC sliding board transfers to/from commode/car, he just wants to work on his ambulation. We will continue to follow him acutely to promote safe discharge to the destination below.  ?   ?Recommendations for follow up therapy are one component of a multi-disciplinary discharge planning process, led by the attending physician.  Recommendations may be updated based on patient status, additional functional criteria and insurance authorization. ? ?Follow Up Recommendations ? Follow physician's recommendations for discharge plan and follow up therapies ?  ?  ?Assistance Recommended at Discharge Intermittent Supervision/Assistance  ?Patient can return  home with the following Help with stairs or ramp for entrance;Assist for transportation;Assistance with cooking/housework;A little help with bathing/dressing/bathroom;A lot of help with walking and/or transfers ?  ?Equipment Recommendations ? Other (comment) (Pt would benefit from [facility] legrest that allows him to elevate his RLE with leg extended. Encouraged pt to call [employer])  ?  ?Recommendations for Other Services   ? ? ?  ?Precautions / Restrictions Precautions ?Precautions: Fall ?Precaution Comments: Pt was previously WC bound and just last week started walking in the // bars at [place]. ?Restrictions ?Weight Bearing Restrictions: No  ?  ? ?Mobility ? Bed Mobility ?Overal bed mobility: Needs Assistance ?Bed Mobility: Supine to Sit ?  ?  ?Supine to sit: Min guard ?  ?  ?General bed mobility comments: Pt min guard for safety, no physical assist required. ?  ? ?Transfers ?Overall transfer level: Needs assistance ?Equipment used: Rolling walker (2 wheels) ?Transfers: Sit to/from Stand ?Sit to Stand: Min assist ?  ?  ?  ?  ?  ?General transfer comment: Pt min assist for lift assist and stabilizing of RW, benefits from elevated surface. Pt requsts use of gait belt and not using shoulders arm during lift assist as other staff members did during transfers at night ?  ? ?Ambulation/Gait ?Ambulation/Gait assistance: +2 safety/equipment, Min assist, +2 physical assistance ?Gait Distance (Feet): 18 Feet ?Assistive device: Rolling walker (2 wheels) ?Gait Pattern/deviations: Step-to pattern, Decreased stance time - right, Decreased dorsiflexion - right, Decreased weight shift to right, Knee flexed in stance - right ?Gait velocity: decreased ?  ?  ?General Gait Details: Pt ambulated with RW and min assist for steadying  of RW and stabilizing of R knee as it was mildly buckling, as ambulation advanced buckling decreased and reduced assist to min guard of R knee, +2 for safety. Recliner follow for safety. Pt demonstrated heavy BUE  use on RW. For final few feet of ambulation task pt demonstrated scissoring gait with narrow BOS. No overt LOB. Pt reporting high level of fatigue and b/l shoulder pain after use of RW, but very motivated and excited to walk. Encouraged pacing at home during ambulation sessions. ? ? ?Stairs ?  ?  ?  ?  ?  ? ? ?Wheelchair Mobility ?  ? ?Modified Rankin (Stroke Patients Only) ?  ? ? ?  ?Balance Overall balance assessment: Needs assistance ?Sitting-balance support: Feet supported, No upper extremity supported ?Sitting balance-Leahy Scale: Fair ?  ?  ?Standing balance support: Bilateral upper extremity supported, During functional activity, Reliant on assistive device for balance ?Standing balance-Leahy Scale: Poor ?Standing balance comment: Pt does have mild R knee buckling at beginning of ambulation task ?  ?  ?  ?  ?  ?  ?  ?  ?  ?  ?  ?  ? ?  ?Cognition Arousal/Alertness: Awake/alert ?Behavior During Therapy: [facility] for tasks assessed/performed ?Overall Cognitive Status: Within Functional Limits for tasks assessed ?  ?  ?  ?  ?  ?  ?  ?  ?  ?  ?  ?  ?  ?  ?  ?  ?  ?  ?  ? ?  ?Exercises Total Joint Exercises ?Ankle Circles/Pumps: AROM, Both, 10 reps ?Quad Sets: AROM, Both, 10 reps ?Short Arc Quad: AROM, Both, 5 reps ?Heel Slides: AROM, Both, 5 reps ?Hip ABduction/ADduction: AROM, Both, 5 reps ?Straight Leg Raises: AROM, Both, 5 reps ? ?  ?General Comments General comments (skin integrity, edema, etc.): Pt on 3LO2 via [place] at start of session, resumed O2 at end as SpO2 decreased to 87%, after resumption SpO2 at 96% ?  ?  ? ?Pertinent Vitals/Pain    ? ? ?Home Living   ?  ?  ?  ?  ?  ?  ?  ?  ?  ?   ?  ?Prior Function    ?  ?  ?   ? ?PT Goals (current goals can now be found in the care plan section) Acute Rehab PT Goals ?Patient Stated Goal: get out of the [facility], try to walk again, play golf ?PT Goal Formulation: With patient ?Time For Goal Achievement: 06/27/21 ?Potential to Achieve Goals: Good ?Progress towards PT goals:  Progressing toward goals ? ?  ?Frequency ? ? ? 7X/week ? ? ? ?  ?PT Plan Current plan remains appropriate  ? ? ?Co-evaluation   ?  ?  ?  ?  ? ?  ?AM-PAC PT "6 Clicks" Mobility   ?Outcome Measure ? Help needed turning from your back to your side while in a flat bed without using bedrails?: None ?Help needed moving from lying on your back to sitting on the side of a flat bed without using bedrails?: A Little ?Help needed moving to and from a bed to a chair (including a wheelchair)?: A Little ?Help needed standing up from a chair using your arms (e.g., wheelchair or bedside chair)?: A Lot ?Help needed to walk in hospital room?: A Lot ?Help needed climbing 3-5 steps with a railing? : A Lot ?6 Click Score: 16 ? ?  ?End of Session Equipment Utilized During Treatment: Gait belt ?Activity Tolerance:  No increased pain;Patient tolerated treatment well ?Patient left: in chair;with call bell/phone within reach;with chair alarm set;with family/visitor present ?Nurse Communication: Mobility status ?PT Visit Diagnosis: Difficulty in walking, not elsewhere classified (R26.2);Unsteadiness on feet (R26.81) ?  ? ? ?Time: 1093-2355 ?PT Time Calculation (min) (ACUTE ONLY): 34 min ? ?Charges:  $Gait Training: 23-37 mins ?$Therapeutic Exercise: 8-22 mins          ?          ?Coolidge Breeze, PT, DPT ?[facility] Department ?Office: [phone] ?Pager: [phone] ? ? ? ?Coolidge Breeze ?06/21/2021, 4:22 PM ? ?

## 2021-06-21 NOTE — Progress Notes (Signed)
Subjective: ?1 Day Post-Op Procedure(s) (LRB): ?RIGHT TOTAL KNEE ARTHROPLASTY (Right) ? ?Patient resting comfortably in bed this morning. He says his knee already feels better than it did before. ? ?Activity level:  wbat ?Diet tolerance:  ok ?Voiding:  ok ?Patient reports pain as mild.   ? ?Objective: ?Vital signs in last 24 hours: ?Temp:  [97.6 ?F (36.4 ?C)-98.5 ?F (36.9 ?C)] 98.3 ?F (36.8 ?C) (05/10 0543) ?Pulse Rate:  [57-93] 73 (05/10 0543) ?Resp:  [14-22] 18 (05/10 0543) ?BP: (99-149)/(64-99) 99/78 (05/10 0543) ?SpO2:  [88 %-98 %] 94 % (05/10 0543) ?Weight:  [357 kg] 108 kg (05/10 0300) ? ?Labs: ?Recent Labs  ?  06/21/21 ?0329  ?HGB 11.0*  ? ?Recent Labs  ?  06/21/21 ?0329  ?WBC 10.0  ?RBC 3.38*  ?HCT 32.1*  ?PLT 136*  ? ?Recent Labs  ?  06/21/21 ?0329  ?NA 137  ?K 4.3  ?CL 103  ?CO2 26  ?BUN 23  ?CREATININE 0.80  ?GLUCOSE 127*  ?CALCIUM 8.7*  ? ?No results for input(s): LABPT, INR in the last 72 hours. ? ?Physical Exam: ? Neurologically intact ?ABD soft ?Neurovascular intact ?Sensation intact distally ?Intact pulses distally ?Dorsiflexion/Plantar flexion intact ?Incision: dressing C/D/I and scant drainage ?No cellulitis present ?Compartment soft ? ?Assessment/Plan: ? ?1 Day Post-Op Procedure(s) (LRB): ?RIGHT TOTAL KNEE ARTHROPLASTY (Right) ?Advance diet ?Up with therapy ?D/C IV fluids ?Discharge home with home health today after PT if cleared and doing well. ?Continue on baseline xarelto. ?Follow up in office 2 weeks post op. ? ? ? ?Larwance Sachs Fantasha Daniele ?06/21/2021, 8:10 AM ? ?
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
## 2021-06-21 NOTE — TOC Transition Note (Signed)
Transition of Care (TOC) - CM/SW Discharge Note ? ?Patient Details  ?Name: Darren Harrison ?MRN: [id number] ?Date of Birth: 10/18/1955 ? ?Transition of Care (TOC) CM/SW Contact:  ?Sherie Don, LCSW ?Phone Number: ?06/21/2021, 10:04 AM ? ?Clinical Narrative: Patient is expected to discharge home after working with PT. HHPT is requested by orthopedist, but as patient resides in [place] several [facility] agencies are unable to accept the patient. CSW made HHPT referral to [facility] with [place], which was accepted by [facility]. CSW updated patient. Patient has a rolling walker and wheelchair at home, so there are no DME needs at this time. TOC signing off. ? ?Final next level of care: [place] ?Barriers to Discharge: No Barriers Identified ? ?Patient Goals and CMS Choice ?Patient states their goals for this hospitalization and ongoing recovery are:: Discharge home with HHPT ?CMS Medicare.gov Compare Post Acute Care list provided to:: Patient ?Choice offered to / list presented to : Patient ? ?Discharge Plan and Services       ?DME Arranged: N/A ?DME Agency: NA ?HH Arranged: PT ?[place] Agency: [facility] ?Date HH Agency Contacted: 06/20/21 ?Time [place]: [phone] ?Representative spoke with at [place]: Levada Dy ? ?Readmission Risk Interventions ?   ? View : No data to display.  ?  ?  ?  ? ?

## 2021-06-22 LAB — CBC
HCT: 28.5 % — ABNORMAL LOW (ref 39.0–52.0)
Hemoglobin: 9.2 g/dL — ABNORMAL LOW (ref 13.0–17.0)
MCH: 31.6 pg (ref 26.0–34.0)
MCHC: 32.3 g/dL (ref 30.0–36.0)
MCV: 97.9 fL (ref 80.0–100.0)
Platelets: 118 10*3/uL — ABNORMAL LOW (ref 150–400)
RBC: 2.91 MIL/uL — ABNORMAL LOW (ref 4.22–5.81)
RDW: 13.2 % (ref 11.5–15.5)
WBC: 6.8 10*3/uL (ref 4.0–10.5)
nRBC: 0 % (ref 0.0–0.2)

## 2021-06-22 NOTE — Plan of Care (Signed)
Pt ready for DC home with wife ?

## 2021-06-22 NOTE — Progress Notes (Signed)
Subjective: ?2 Days Post-Op Procedure(s) (LRB): ?RIGHT TOTAL KNEE ARTHROPLASTY (Right) ? ?Patient slept better last night and is hoping to pass PT and go home today. ? ?Activity level:  wbat ?Diet tolerance:  ok ?Voiding:  ok ?Patient reports pain as mild.   ? ?Objective: ?Vital signs in last 24 hours: ?Temp:  [97.6 ?F (36.4 ?C)-98.5 ?F (36.9 ?C)] 97.6 ?F (36.4 ?C) (05/11 7482) ?Pulse Rate:  [58-76] 62 (05/11 0511) ?Resp:  [17-19] 17 (05/11 0511) ?BP: (116-135)/(74-89) 116/82 (05/11 0511) ?SpO2:  [95 %-99 %] 95 % (05/11 0511) ? ?Labs: ?Recent Labs  ?  06/21/21 ?0329 06/22/21 ?0327  ?HGB 11.0* 9.2*  ? ?Recent Labs  ?  06/21/21 ?0329 06/22/21 ?0327  ?WBC 10.0 6.8  ?RBC 3.38* 2.91*  ?HCT 32.1* 28.5*  ?PLT 136* 118*  ? ?Recent Labs  ?  06/21/21 ?0329  ?NA 137  ?K 4.3  ?CL 103  ?CO2 26  ?BUN 23  ?CREATININE 0.80  ?GLUCOSE 127*  ?CALCIUM 8.7*  ? ?No results for input(s): LABPT, INR in the last 72 hours. ? ?Physical Exam: ? Neurologically intact ?ABD soft ?Neurovascular intact ?Sensation intact distally ?Intact pulses distally ?Dorsiflexion/Plantar flexion intact ?Incision: dressing C/D/I and scant drainage ?No cellulitis present ?Compartment soft ? ?Assessment/Plan: ? ?2 Days Post-Op Procedure(s) (LRB): ?RIGHT TOTAL KNEE ARTHROPLASTY (Right) ?Advance diet ?Up with therapy ?Discharge home with home health today if cleared by PT and doing well. ?I changed his dressing this morning as there was a little bit of blood on it. ?Continue on baseline xarelto.  ?Follow up in office 2 weeks post op. ? ?Larwance Sachs Razi Hickle ?06/22/2021, 6:55 AM ? ?

## 2021-06-22 NOTE — Progress Notes (Addendum)
Physical Therapy Treatment ?Patient Details ?Name: Darren Harrison ?MRN: [id number] ?DOB: Jun 14, 1955 ?Today's Date: 06/22/2021 ? ? ?History of Present Illness Pt is a 66yo male presenting s/p R-TKA on 06/20/21. PMH: uses WC at baseline, cervical myelopathy, GERD, HTN, hypothyroidism, cervical C3-C4 & C5-C6 fusion, lumbar L2-L3 & L3-L4 & L4-L5 laminectomy. ? ?  ?PT Comments  ? ? Pt ambulated 12' x 2 with seated rest break, distance limited by BUE fatigue. Reviewed TKA HEP with pt/spouse. They stated they feel they are ready to DC home and can manage pt's care at his current functional level.  He is ready to DC home from a PT standpoint.  ?   ?Recommendations for follow up therapy are one component of a multi-disciplinary discharge planning process, led by the attending physician.  Recommendations may be updated based on patient status, additional functional criteria and insurance authorization. ? ?Follow Up Recommendations ? Follow physician's recommendations for discharge plan and follow up therapies ?  ?  ?Assistance Recommended at Discharge Intermittent Supervision/Assistance  ?Patient can return home with the following Help with stairs or ramp for entrance;Assist for transportation;Assistance with cooking/housework;A little help with bathing/dressing/bathroom;A little help with walking and/or transfers ?  ?Equipment Recommendations ? Other (comment) (Pt would benefit from [facility] legrest that allows him to elevate his RLE with leg extended. Pt called provider of his WC, they don't have elevating leg rests, so they plan to rent a different WC that has elevating leg rest.)  ?  ?Recommendations for Other Services   ? ? ?  ?Precautions / Restrictions Precautions ?Precautions: Fall; knee ?Precaution Comments: Pt was previously WC bound and just last week started walking in the // bars at [place]. Reviewed no pillow under knee. ?Restrictions ?Weight Bearing Restrictions: No  ?  ? ?Mobility ? Bed Mobility ?Overal bed mobility:  Modified Independent ?Bed Mobility: Supine to Sit ?  ?  ?Supine to sit: Modified independent (Device/Increase time) ?  ?  ?General bed mobility comments: no physical assist required. ?  ? ?Transfers ?Overall transfer level: Needs assistance ?Equipment used: Rolling walker (2 wheels) ?Transfers: Sit to/from Stand ?Sit to Stand: Min assist ?  ?  ?  ?  ?  ?General transfer comment: Pt min assist for lift assist and stabilizing of RW, benefits from elevated surface. Pt requests use of gait belt and not using shoulders arm during lift assist as other staff members did during transfers at night ?  ? ?Ambulation/Gait ?Ambulation/Gait assistance: +2 safety/equipment, Min guard ?Gait Distance (Feet): 12 Feet ?Assistive device: Rolling walker (2 wheels) ?Gait Pattern/deviations: Step-to pattern, Decreased stance time - right, Decreased dorsiflexion - right, Decreased weight shift to right, Knee flexed in stance - right ?Gait velocity: decreased ?  ?  ?General Gait Details: 12' x 2 with seated rest, distance limited by BUE fatigue, +2 for safety with Recliner follow for safety. Pt demonstrated heavy BUE use on RW. Mild buckling of LLE (pt stated this knee needs to be replaced), pt able to self correct with use of RW. No overt LOB. Pt reporting high level of fatigue and b/l shoulder pain after use of RW, but very motivated. ? ? ?Stairs ?  ?  ?  ?  ?  ? ? ?Wheelchair Mobility ?  ? ?Modified Rankin (Stroke Patients Only) ?  ? ? ?  ?Balance Overall balance assessment: Needs assistance ?Sitting-balance support: Feet supported, No upper extremity supported ?Sitting balance-Leahy Scale: Fair ?  ?  ?Standing balance support: Bilateral upper extremity supported, During  functional activity, Reliant on assistive device for balance ?Standing balance-Leahy Scale: Poor ?  ?  ?  ?  ?  ?  ?  ?  ?  ?  ?  ?  ?  ? ?  ?Cognition Arousal/Alertness: Awake/alert ?Behavior During Therapy: [facility] for tasks assessed/performed ?Overall Cognitive Status:  Within Functional Limits for tasks assessed ?  ?  ?  ?  ?  ?  ?  ?  ?  ?  ?  ?  ?  ?  ?  ?  ?  ?  ?  ? ?  ?Exercises Total Joint Exercises ?Ankle Circles/Pumps: AROM, Both, 10 reps ?Quad Sets: AROM, Both, 5 reps ?Short Arc Quad: AROM, 5 reps, Right, Supine ?Heel Slides: AAROM, Right, 10 reps, Supine ?Hip ABduction/ADduction: AROM, 5 reps, Right, Supine ?Straight Leg Raises: AROM, 5 reps, Right, Supine ? ?  ?General Comments   ?  ?  ? ?Pertinent Vitals/Pain Pain Assessment ?Pain Assessment: No/denies pain  ? ? ?Home Living   ?  ?  ?  ?  ?  ?  ?  ?  ?  ?   ?  ?Prior Function    ?  ?  ?   ? ?PT Goals (current goals can now be found in the care plan section) Acute Rehab PT Goals ?Patient Stated Goal: get out of the [facility], try to walk again, play golf ?PT Goal Formulation: With patient/family ?Time For Goal Achievement: 06/27/21 ?Potential to Achieve Goals: Good ?Progress towards PT goals: Progressing toward goals ? ?  ?Frequency ? ? ? 7X/week ? ? ? ?  ?PT Plan Current plan remains appropriate  ? ? ?Co-evaluation   ?  ?  ?  ?  ? ?  ?AM-PAC PT "6 Clicks" Mobility   ?Outcome Measure ? Help needed turning from your back to your side while in a flat bed without using bedrails?: None ?Help needed moving from lying on your back to sitting on the side of a flat bed without using bedrails?: A Little ?Help needed moving to and from a bed to a chair (including a wheelchair)?: A Little ?Help needed standing up from a chair using your arms (e.g., wheelchair or bedside chair)?: A Little ?Help needed to walk in hospital room?: A Little ?Help needed climbing 3-5 steps with a railing? : A Lot ?6 Click Score: 18 ? ?  ?End of Session Equipment Utilized During Treatment: Gait belt ?Activity Tolerance: No increased pain;Patient tolerated treatment well ?Patient left: in chair;with call bell/phone within reach;with chair alarm set;with family/visitor present ?Nurse Communication: Mobility status ?PT Visit Diagnosis: Difficulty in walking, not  elsewhere classified (R26.2);Unsteadiness on feet (R26.81) ?  ? ? ?Time: 1749-4496 ?PT Time Calculation (min) (ACUTE ONLY): 35 min ? ?Charges:  $Gait Training: 8-22 mins ?$Therapeutic Exercise: 8-22 mins          ?          ? ?Philomena Doheny PT 06/22/2021  ?Acute Rehabilitation Services ?Pager [phone] ?Office [phone] ? ? ?

## 2021-06-23 ENCOUNTER — Ambulatory Visit: Payer: Medicare Other | Admitting: Physical Medicine and Rehabilitation

## 2021-06-30 ENCOUNTER — Ambulatory Visit: Payer: Medicare Other | Admitting: Physical Medicine and Rehabilitation

## 2021-06-30 ENCOUNTER — Other Ambulatory Visit: Payer: Self-pay | Admitting: Physical Medicine and Rehabilitation

## 2021-06-30 ENCOUNTER — Other Ambulatory Visit (HOSPITAL_COMMUNITY): Payer: Self-pay | Admitting: Nurse Practitioner

## 2021-06-30 DIAGNOSIS — K7469 Other cirrhosis of liver: Secondary | ICD-10-CM

## 2021-06-30 DIAGNOSIS — K766 Portal hypertension: Secondary | ICD-10-CM

## 2021-07-05 ENCOUNTER — Other Ambulatory Visit: Payer: Self-pay | Admitting: Physical Medicine and Rehabilitation

## 2021-08-25 ENCOUNTER — Ambulatory Visit (HOSPITAL_COMMUNITY)
Admission: RE | Admit: 2021-08-25 | Discharge: 2021-08-25 | Disposition: A | Discharge status: Home / Self Care | Payer: Medicare Other | Source: Ambulatory Visit | Attending: Nurse Practitioner | Admitting: Nurse Practitioner

## 2021-08-25 ENCOUNTER — Encounter
Payer: Medicare Other | Attending: Physical Medicine and Rehabilitation | Admitting: Physical Medicine and Rehabilitation

## 2021-08-25 ENCOUNTER — Encounter: Payer: Self-pay | Admitting: Physical Medicine and Rehabilitation

## 2021-08-25 VITALS — BP 129/87 | HR 95 | Ht 70.0 in | Wt 228.0 lb

## 2021-08-25 DIAGNOSIS — K7469 Other cirrhosis of liver: Secondary | ICD-10-CM | POA: Diagnosis present

## 2021-08-25 DIAGNOSIS — Z993 Dependence on wheelchair: Secondary | ICD-10-CM | POA: Diagnosis present

## 2021-08-25 DIAGNOSIS — K766 Portal hypertension: Secondary | ICD-10-CM

## 2021-08-25 DIAGNOSIS — G8250 Quadriplegia, unspecified: Secondary | ICD-10-CM | POA: Insufficient documentation

## 2021-08-25 DIAGNOSIS — Z96651 Presence of right artificial knee joint: Secondary | ICD-10-CM | POA: Diagnosis present

## 2021-08-25 DIAGNOSIS — N319 Neuromuscular dysfunction of bladder, unspecified: Secondary | ICD-10-CM | POA: Insufficient documentation
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
## 2021-08-25 NOTE — Patient Instructions (Signed)
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 Also has has BPH and Afib.   Just had R TKR 5/23; and also has new dx of cirrhosis of liver.  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   SCI support group meeting- starts July 27- Thursday 6-7 at [place]. Given flyer.   2. Lay on stomach for 5-10 min in morning before getting out of bed and evening before going to sleep (fully extending hip and knee)  3. Elevate legs for at least 30 min twice per day  4. Pt can drive- however, needs to start in a parking lot- and go slow-   5. W/C fitting is not an issue- since gained weight.    6. Con't Baclofen 15 mg TID and Flexeril 10 mg QHS   7. Con't Flomax 0.8 nightly-   8. Lay down 1 hour before goes to sleep- and then pee before goes to sleep- so can equilibrate fluid and get rid of some being stored in legs.    9. Needs to f/u with Urology since doesn't always feel like emptying.    10. Doesn't need any tramadol.    11. F/U in 6 months- double appointment

## 2021-08-26 ENCOUNTER — Other Ambulatory Visit: Payer: Self-pay | Admitting: Physical Medicine and Rehabilitation

## 2021-09-09 LAB — T4, FREE: Free T4: 1.25 ng/dL (ref 0.82–1.77)

## 2021-09-09 LAB — TSH: TSH: 13.8 u[IU]/mL — ABNORMAL HIGH (ref 0.450–4.500)

## 2021-09-13 ENCOUNTER — Ambulatory Visit (INDEPENDENT_AMBULATORY_CARE_PROVIDER_SITE_OTHER): Payer: Medicare Other | Admitting: "Endocrinology

## 2021-09-13 ENCOUNTER — Encounter: Payer: Self-pay | Admitting: "Endocrinology

## 2021-09-13 ENCOUNTER — Telehealth: Payer: Self-pay | Admitting: "Endocrinology

## 2021-09-13 VITALS — BP 130/86 | HR 64 | Ht 70.0 in | Wt 250.0 lb

## 2021-09-13 DIAGNOSIS — E890 Postprocedural hypothyroidism: Secondary | ICD-10-CM | POA: Diagnosis not present

## 2021-09-13 MED ORDER — LEVOTHYROXINE SODIUM 175 MCG PO TABS: 175.0000 ug | ORAL_TABLET | Freq: Every day | ORAL | 1 refills | Status: DC
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
## 2021-09-13 NOTE — Telephone Encounter (Signed)
Pt left a VM stating that he has been taking the 150 MCG, he needs a rx sent in for the 175 mcg. Thank you

## 2021-09-25 ENCOUNTER — Other Ambulatory Visit: Payer: Self-pay | Admitting: Physical Medicine and Rehabilitation

## 2021-10-17 ENCOUNTER — Other Ambulatory Visit: Payer: Self-pay | Admitting: Orthopaedic Surgery

## 2021-10-17 NOTE — Progress Notes (Signed)
Sent message, via epic in basket, requesting orders in epic from surgeon.  

## 2021-10-18 NOTE — Patient Instructions (Signed)
DUE TO COVID-19 ONLY TWO VISITORS  (aged 66 and older)  ARE ALLOWED TO COME WITH YOU AND STAY IN THE WAITING ROOM ONLY DURING PRE OP AND PROCEDURE.   **NO VISITORS ARE ALLOWED IN THE SHORT STAY AREA OR RECOVERY ROOM!!**  IF YOU WILL BE ADMITTED INTO THE HOSPITAL YOU ARE ALLOWED ONLY FOUR SUPPORT PEOPLE DURING VISITATION HOURS ONLY (7 AM -8PM)   The support person(s) must pass our screening, gel in and out, and wear a mask at all times, including in the patient's room. Patients must also wear a mask when staff or their support person are in the room. Visitors GUEST BADGE MUST BE WORN VISIBLY  One adult visitor may remain with you overnight and MUST be in the room by 8 P.M.     Your procedure is scheduled on: 10/24/21   Report to [facility] Main Entrance    Report to admitting at : 11:00 AM   Call this number if you have problems the morning of surgery [phone]   Do not eat food :After Midnight.   After Midnight you may have the following liquids until : 10:30 AM DAY OF SURGERY  Water Black Coffee (sugar ok, NO MILK/CREAM OR CREAMERS)  Tea (sugar ok, NO MILK/CREAM OR CREAMERS) regular and decaf                             Plain Jell-O (NO RED)                                           Fruit ices (not with fruit pulp, NO RED)                                     Popsicles (NO RED)                                                                  Juice: apple, WHITE grape, WHITE cranberry Sports drinks like Gatorade (NO RED)              Drink  Ensure drink AT :10:30 AM the day of surgery.     The day of surgery:  Drink ONE (1) Pre-Surgery Clear Ensure or G2 at AM the morning of surgery. Drink in one sitting. Do not sip.  This drink was given to you during your hospital  pre-op appointment visit. Nothing else to drink after completing the  Pre-Surgery Clear Ensure or G2.          If you have questions, please contact your surgeon's office.   Oral Hygiene is also  important to reduce your risk of infection.                                    Remember - BRUSH YOUR TEETH THE MORNING OF SURGERY WITH YOUR REGULAR TOOTHPASTE   Do NOT smoke after Midnight   Take these medicines the morning of surgery with A SIP  OF WATER: metoprolol,synthroid.  DO NOT TAKE ANY ORAL DIABETIC MEDICATIONS DAY OF YOUR SURGERY  Bring CPAP mask and tubing day of surgery.                              You may not have any metal on your body including hair pins, jewelry, and body piercing             Do not wear lotions, powders, perfumes/cologne, or deodorant              Men may shave face and neck.   Do not bring valuables to the hospital. [place].   Contacts, dentures or bridgework may not be worn into surgery.   Bring small overnight bag day of surgery.   DO NOT [place]. PHARMACY WILL DISPENSE MEDICATIONS LISTED ON YOUR MEDICATION LIST TO YOU DURING YOUR ADMISSION [place]!    Patients discharged on the day of surgery will not be allowed to drive home.  Someone NEEDS to stay with you for the first 24 hours after anesthesia.   Special Instructions: Bring a copy of your healthcare power of attorney and living will documents         the day of surgery if you haven't scanned them before.              Please read over the following fact sheets you were given: IF YOU HAVE QUESTIONS ABOUT YOUR PRE-OP INSTRUCTIONS PLEASE CALL [phone]     [facility] - Preparing for Surgery Before surgery, you can play an important role.  Because skin is not sterile, your skin needs to be as free of germs as possible.  You can reduce the number of germs on your skin by washing with CHG (chlorahexidine gluconate) soap before surgery.  CHG is an antiseptic cleaner which kills germs and bonds with the skin to continue killing germs even after washing. Please DO NOT use if you have an allergy to CHG  or antibacterial soaps.  If your skin becomes reddened/irritated stop using the CHG and inform your nurse when you arrive at Short Stay. Do not shave (including legs and underarms) for at least 48 hours prior to the first CHG shower.  You may shave your face/neck. Please follow these instructions carefully:  1.  Shower with CHG Soap the night before surgery and the  morning of Surgery.  2.  If you choose to wash your hair, wash your hair first as usual with your  normal  shampoo.  3.  After you shampoo, rinse your hair and body thoroughly to remove the  shampoo.                           4.  Use CHG as you would any other liquid soap.  You can apply chg directly  to the skin and wash                       Gently with a scrungie or clean washcloth.  5.  Apply the CHG Soap to your body ONLY FROM THE NECK DOWN.   Do not use on face/ open  Wound or open sores. Avoid contact with eyes, ears mouth and genitals (private parts).                       Wash face,  Genitals (private parts) with your normal soap.             6.  Wash thoroughly, paying special attention to the area where your surgery  will be performed.  7.  Thoroughly rinse your body with warm water from the neck down.  8.  DO NOT shower/wash with your normal soap after using and rinsing off  the CHG Soap.                9.  Pat yourself dry with a clean towel.            10.  Wear clean pajamas.            11.  Place clean sheets on your bed the night of your first shower and do not  sleep with pets. Day of Surgery : Do not apply any lotions/deodorants the morning of surgery.  Please wear clean clothes to the hospital/surgery center.  FAILURE TO FOLLOW THESE INSTRUCTIONS MAY RESULT IN THE CANCELLATION OF YOUR SURGERY PATIENT SIGNATURE_________________________________  NURSE SIGNATURE__________________________________  ________________________________________________________________________   Darren Harrison  An incentive spirometer is a tool that can help keep your lungs clear and active. This tool measures how well you are filling your lungs with each breath. Taking long deep breaths may help reverse or decrease the chance of developing breathing (pulmonary) problems (especially infection) following: A long period of time when you are unable to move or be active. BEFORE THE PROCEDURE  If the spirometer includes an indicator to show your best effort, your nurse or respiratory therapist will set it to a desired goal. If possible, sit up straight or lean slightly forward. Try not to slouch. Hold the incentive spirometer in an upright position. INSTRUCTIONS FOR USE  Sit on the edge of your bed if possible, or sit up as far as you can in bed or on a chair. Hold the incentive spirometer in an upright position. Breathe out normally. Place the mouthpiece in your mouth and seal your lips tightly around it. Breathe in slowly and as deeply as possible, raising the piston or the ball toward the top of the column. Hold your breath for 3-5 seconds or for as long as possible. Allow the piston or ball to fall to the bottom of the column. Remove the mouthpiece from your mouth and breathe out normally. Rest for a few seconds and repeat Steps 1 through 7 at least 10 times every 1-2 hours when you are awake. Take your time and take a few normal breaths between deep breaths. The spirometer may include an indicator to show your best effort. Use the indicator as a goal to work toward during each repetition. After each set of 10 deep breaths, practice coughing to be sure your lungs are clear. If you have an incision (the cut made at the time of surgery), support your incision when coughing by placing a pillow or rolled up towels firmly against it. Once you are able to get out of bed, walk around indoors and cough well. You may stop using the incentive spirometer when instructed by your caregiver.  RISKS AND  COMPLICATIONS Take your time so you do not get dizzy or light-headed. If you are in pain, you may need to take or  ask for pain medication before doing incentive spirometry. It is harder to take a deep breath if you are having pain. AFTER USE Rest and breathe slowly and easily. It can be helpful to keep track of a log of your progress. Your caregiver can provide you with a simple table to help with this. If you are using the spirometer at home, follow these instructions: [place] IF:  You are having difficultly using the spirometer. You have trouble using the spirometer as often as instructed. Your pain medication is not giving enough relief while using the spirometer. You develop fever of 100.5 F (38.1 C) or higher. SEEK IMMEDIATE MEDICAL CARE IF:  You cough up bloody sputum that had not been present before. You develop fever of 102 F (38.9 C) or greater. You develop worsening pain at or near the incision site. MAKE SURE YOU:  Understand these instructions. Will watch your condition. Will get help right away if you are not doing well or get worse. Document Released: 06/11/2006 Document Revised: 04/23/2011 Document Reviewed: 08/12/2006 [facility] Patient Information 2014 [place], [place].   ________________________________________________________________________

## 2021-10-19 ENCOUNTER — Encounter (HOSPITAL_COMMUNITY): Payer: Self-pay

## 2021-10-19 ENCOUNTER — Other Ambulatory Visit: Payer: Self-pay

## 2021-10-19 ENCOUNTER — Encounter (HOSPITAL_COMMUNITY)
Admission: RE | Admit: 2021-10-19 | Discharge: 2021-10-19 | Disposition: A | Discharge status: Home / Self Care | Payer: Medicare Other | Source: Ambulatory Visit | Attending: Orthopaedic Surgery | Admitting: Orthopaedic Surgery

## 2021-10-19 VITALS — BP 129/88 | HR 62 | Temp 98.2°F | Ht 70.0 in | Wt 250.0 lb

## 2021-10-19 DIAGNOSIS — Z01812 Encounter for preprocedural laboratory examination: Secondary | ICD-10-CM | POA: Diagnosis present

## 2021-10-19 DIAGNOSIS — I2510 Atherosclerotic heart disease of native coronary artery without angina pectoris: Secondary | ICD-10-CM | POA: Insufficient documentation

## 2021-10-19 DIAGNOSIS — Z01818 Encounter for other preprocedural examination: Secondary | ICD-10-CM

## 2021-10-19 HISTORY — DX: Fatty (change of) liver, not elsewhere classified: K76.0

## 2021-10-19 LAB — SURGICAL PCR SCREEN
MRSA, PCR: NEGATIVE
Staphylococcus aureus: NEGATIVE

## 2021-10-19 LAB — CBC
HCT: 40.8 % (ref 39.0–52.0)
Hemoglobin: 13.1 g/dL (ref 13.0–17.0)
MCH: 28.9 pg (ref 26.0–34.0)
MCHC: 32.1 g/dL (ref 30.0–36.0)
MCV: 90.1 fL (ref 80.0–100.0)
Platelets: 148 10*3/uL — ABNORMAL LOW (ref 150–400)
RBC: 4.53 MIL/uL (ref 4.22–5.81)
RDW: 14.7 % (ref 11.5–15.5)
WBC: 4.4 10*3/uL (ref 4.0–10.5)
nRBC: 0 % (ref 0.0–0.2)

## 2021-10-19 LAB — BASIC METABOLIC PANEL
Anion gap: 6 (ref 5–15)
BUN: 17 mg/dL (ref 8–23)
CO2: 28 mmol/L (ref 22–32)
Calcium: 8.8 mg/dL — ABNORMAL LOW (ref 8.9–10.3)
Chloride: 106 mmol/L (ref 98–111)
Creatinine, Ser: 0.67 mg/dL (ref 0.61–1.24)
GFR, Estimated: >60 mL/min (ref >60)
Glucose, Bld: 97 mg/dL (ref 70–99)
Potassium: 4.4 mmol/L (ref 3.5–5.1)
Sodium: 140 mmol/L (ref 135–145)

## 2021-10-19 NOTE — Progress Notes (Signed)
For Short Stay: [place] appointment date: Date of COVID positive in last 45 days:  Bowel Prep reminder:   For Anesthesia: PCP - Micah Pacifico: FNP Cardiologist - Dr. Theadora Rama [facility] [place].  Chest x-ray - 06/13/21 EKG - 06/13/21 Stress Test -  ECHO -  Cardiac Cath -  Pacemaker/ICD device last checked: Pacemaker orders received: Device Rep notified:  Spinal Cord Stimulator:  Sleep Study -  CPAP -   Fasting Blood Sugar -  Checks Blood Sugar _____ times a day Date and result of last Hgb A1c-  Blood Thinner Instructions:Xarelto: will be held 3 days before surgery: as per cardiologist instruction's. Aspirin Instructions: Last Dose:  Activity level: Can go up a flight of stairs and activities of daily living without stopping and without chest pain and/or shortness of breath   Able to exercise without chest pain and/or shortness of breath   Unable to go up a flight of stairs without chest pain and/or shortness of breath     Anesthesia review: Hx: Afib,HTN.  Patient denies shortness of breath, fever, cough and chest pain at PAT appointment   Patient verbalized understanding of instructions that were given to them at the PAT appointment. Patient was also instructed that they will need to review over the PAT instructions again at home before surgery.

## 2021-10-20 ENCOUNTER — Other Ambulatory Visit: Payer: Self-pay | Admitting: "Endocrinology
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
## 2021-10-24 ENCOUNTER — Observation Stay (HOSPITAL_COMMUNITY)
Admission: RE | Admit: 2021-10-24 | Discharge: 2021-10-25 | Disposition: A | Discharge status: Home Health | Payer: Medicare Other | Attending: Orthopaedic Surgery | Admitting: Orthopaedic Surgery

## 2021-10-24 ENCOUNTER — Encounter (HOSPITAL_COMMUNITY)
Admission: RE | Disposition: A | Discharge status: Home Health | Payer: Self-pay | Source: Home / Self Care | Attending: Orthopaedic Surgery

## 2021-10-24 ENCOUNTER — Other Ambulatory Visit: Payer: Self-pay

## 2021-10-24 ENCOUNTER — Ambulatory Visit (HOSPITAL_COMMUNITY): Payer: Medicare Other | Admitting: Physician Assistant

## 2021-10-24 ENCOUNTER — Encounter (HOSPITAL_COMMUNITY): Payer: Self-pay | Admitting: Orthopaedic Surgery

## 2021-10-24 ENCOUNTER — Ambulatory Visit (HOSPITAL_BASED_OUTPATIENT_CLINIC_OR_DEPARTMENT_OTHER): Payer: Medicare Other | Admitting: Anesthesiology

## 2021-10-24 DIAGNOSIS — Z7901 Long term (current) use of anticoagulants: Secondary | ICD-10-CM | POA: Insufficient documentation

## 2021-10-24 DIAGNOSIS — I4891 Unspecified atrial fibrillation: Secondary | ICD-10-CM

## 2021-10-24 DIAGNOSIS — M1712 Unilateral primary osteoarthritis, left knee: Principal | ICD-10-CM | POA: Insufficient documentation

## 2021-10-24 DIAGNOSIS — Z96652 Presence of left artificial knee joint: Secondary | ICD-10-CM

## 2021-10-24 DIAGNOSIS — I10 Essential (primary) hypertension: Secondary | ICD-10-CM

## 2021-10-24 DIAGNOSIS — Z79899 Other long term (current) drug therapy: Secondary | ICD-10-CM | POA: Diagnosis not present

## 2021-10-24 DIAGNOSIS — Z96651 Presence of right artificial knee joint: Secondary | ICD-10-CM | POA: Insufficient documentation

## 2021-10-24 DIAGNOSIS — E039 Hypothyroidism, unspecified: Secondary | ICD-10-CM | POA: Insufficient documentation

## 2021-10-24 DIAGNOSIS — Z87891 Personal history of nicotine dependence: Secondary | ICD-10-CM

## 2021-10-24 HISTORY — PX: TOTAL KNEE ARTHROPLASTY: SHX125

## 2021-10-24 LAB — PROTIME-INR
INR: 1.2 (ref 0.8–1.2)
Prothrombin Time: 15.4 seconds — ABNORMAL HIGH (ref 11.4–15.2)

## 2021-10-24 SURGERY — ARTHROPLASTY, KNEE, TOTAL
Anesthesia: Regional | Site: Knee | Laterality: Left

## 2021-10-24 MED ORDER — HYDROMORPHONE HCL 1 MG/ML IJ SOLN
INTRAMUSCULAR | Status: DC | PRN
  Administered 2021-10-24 (×4): .5 mg via INTRAVENOUS

## 2021-10-24 MED ORDER — FENTANYL CITRATE (PF) 250 MCG/5ML IJ SOLN
INTRAMUSCULAR | Status: AC
  Filled 2021-10-24: qty 5

## 2021-10-24 MED ORDER — 0.9 % SODIUM CHLORIDE (POUR BTL) OPTIME
TOPICAL | Status: DC | PRN
  Administered 2021-10-24: 1000 mL

## 2021-10-24 MED ORDER — ACETAMINOPHEN 10 MG/ML IV SOLN: 1000.0000 mg | Freq: Once | INTRAVENOUS | Status: DC | PRN

## 2021-10-24 MED ORDER — MIDAZOLAM HCL 2 MG/2ML IJ SOLN
INTRAMUSCULAR | Status: AC
  Filled 2021-10-24: qty 2

## 2021-10-24 MED ORDER — AMISULPRIDE (ANTIEMETIC) 5 MG/2ML IV SOLN: 10.0000 mg | Freq: Once | INTRAVENOUS | Status: DC | PRN

## 2021-10-24 MED ORDER — DIPHENHYDRAMINE HCL 12.5 MG/5ML PO ELIX: 12.5000 mg | ORAL_SOLUTION | ORAL | Status: DC | PRN

## 2021-10-24 MED ORDER — ACETAMINOPHEN 325 MG PO TABS: 325.0000 mg | ORAL_TABLET | Freq: Four times a day (QID) | ORAL | Status: DC | PRN

## 2021-10-24 MED ORDER — DOCUSATE SODIUM 100 MG PO CAPS
100.0000 mg | ORAL_CAPSULE | Freq: Two times a day (BID) | ORAL | Status: DC
  Administered 2021-10-24 – 2021-10-25 (×2): 100 mg via ORAL
  Filled 2021-10-24 (×2): qty 1

## 2021-10-24 MED ORDER — DEXAMETHASONE SODIUM PHOSPHATE 10 MG/ML IJ SOLN
INTRAMUSCULAR | Status: AC
  Filled 2021-10-24: qty 1

## 2021-10-24 MED ORDER — TRANEXAMIC ACID-NACL 1000-0.7 MG/100ML-% IV SOLN
1000.0000 mg | INTRAVENOUS | Status: AC
  Administered 2021-10-24: 1000 mg via INTRAVENOUS
  Filled 2021-10-24: qty 100

## 2021-10-24 MED ORDER — FENTANYL CITRATE PF 50 MCG/ML IJ SOSY
100.0000 ug | PREFILLED_SYRINGE | INTRAMUSCULAR | Status: DC
  Filled 2021-10-24: qty 2

## 2021-10-24 MED ORDER — PROPOFOL 10 MG/ML IV BOLUS
INTRAVENOUS | Status: AC
  Filled 2021-10-24: qty 20

## 2021-10-24 MED ORDER — KETOROLAC TROMETHAMINE 15 MG/ML IJ SOLN
7.5000 mg | Freq: Four times a day (QID) | INTRAMUSCULAR | Status: AC
  Administered 2021-10-24 – 2021-10-25 (×3): 7.5 mg via INTRAVENOUS
  Filled 2021-10-24 (×2): qty 1

## 2021-10-24 MED ORDER — BISACODYL 5 MG PO TBEC: 5.0000 mg | DELAYED_RELEASE_TABLET | Freq: Every day | ORAL | Status: DC | PRN

## 2021-10-24 MED ORDER — LEVOTHYROXINE SODIUM 50 MCG PO TABS
175.0000 ug | ORAL_TABLET | Freq: Every day | ORAL | Status: DC
  Administered 2021-10-25: 175 ug via ORAL
  Filled 2021-10-24: qty 1

## 2021-10-24 MED ORDER — HYDROMORPHONE HCL 1 MG/ML IJ SOLN: 0.5000 mg | INTRAMUSCULAR | Status: DC | PRN

## 2021-10-24 MED ORDER — POVIDONE-IODINE 10 % EX SWAB
2.0000 | Freq: Once | CUTANEOUS | Status: AC
  Administered 2021-10-24: 2 via TOPICAL

## 2021-10-24 MED ORDER — BUPIVACAINE-EPINEPHRINE (PF) 0.5% -1:200000 IJ SOLN
INTRAMUSCULAR | Status: AC
  Filled 2021-10-24: qty 30

## 2021-10-24 MED ORDER — SODIUM CHLORIDE 0.9 % IR SOLN
Status: DC | PRN
  Administered 2021-10-24: 3000 mL

## 2021-10-24 MED ORDER — ORAL CARE MOUTH RINSE: 15.0000 mL | Freq: Once | OROMUCOSAL | Status: AC

## 2021-10-24 MED ORDER — LACTATED RINGERS IV SOLN: INTRAVENOUS | Status: DC

## 2021-10-24 MED ORDER — ACETAMINOPHEN 500 MG PO TABS
1000.0000 mg | ORAL_TABLET | Freq: Four times a day (QID) | ORAL | Status: AC
  Administered 2021-10-24 – 2021-10-25 (×3): 500 mg via ORAL
  Filled 2021-10-24 (×3): qty 2

## 2021-10-24 MED ORDER — LACTATED RINGERS IV BOLUS: 250.0000 mL | Freq: Once | INTRAVENOUS | Status: DC

## 2021-10-24 MED ORDER — FENTANYL CITRATE PF 50 MCG/ML IJ SOSY: 25.0000 ug | PREFILLED_SYRINGE | INTRAMUSCULAR | Status: DC | PRN

## 2021-10-24 MED ORDER — DEXAMETHASONE SODIUM PHOSPHATE 10 MG/ML IJ SOLN
INTRAMUSCULAR | Status: DC | PRN
  Administered 2021-10-24: 10 mg via INTRAVENOUS

## 2021-10-24 MED ORDER — METOCLOPRAMIDE HCL 5 MG/ML IJ SOLN: 5.0000 mg | Freq: Three times a day (TID) | INTRAMUSCULAR | Status: DC | PRN

## 2021-10-24 MED ORDER — BUPIVACAINE-EPINEPHRINE 0.5% -1:200000 IJ SOLN
INTRAMUSCULAR | Status: DC | PRN
  Administered 2021-10-24: 30 mL

## 2021-10-24 MED ORDER — KETAMINE HCL 50 MG/5ML IJ SOSY
PREFILLED_SYRINGE | INTRAMUSCULAR | Status: AC
  Filled 2021-10-24: qty 5

## 2021-10-24 MED ORDER — ONDANSETRON HCL 4 MG/2ML IJ SOLN
INTRAMUSCULAR | Status: AC
  Filled 2021-10-24: qty 2

## 2021-10-24 MED ORDER — LACTATED RINGERS IV BOLUS
500.0000 mL | Freq: Once | INTRAVENOUS | Status: AC
  Administered 2021-10-24: 500 mL via INTRAVENOUS

## 2021-10-24 MED ORDER — EPHEDRINE SULFATE-NACL 50-0.9 MG/10ML-% IV SOSY
PREFILLED_SYRINGE | INTRAVENOUS | Status: DC | PRN
  Administered 2021-10-24: 5 mg via INTRAVENOUS

## 2021-10-24 MED ORDER — TRANEXAMIC ACID-NACL 1000-0.7 MG/100ML-% IV SOLN: 1000.0000 mg | Freq: Once | INTRAVENOUS | Status: DC

## 2021-10-24 MED ORDER — BUPIVACAINE LIPOSOME 1.3 % IJ SUSP
INTRAMUSCULAR | Status: AC
  Filled 2021-10-24: qty 20

## 2021-10-24 MED ORDER — PROPOFOL 1000 MG/100ML IV EMUL
INTRAVENOUS | Status: AC
  Filled 2021-10-24: qty 100

## 2021-10-24 MED ORDER — METHOCARBAMOL 500 MG PO TABS
500.0000 mg | ORAL_TABLET | Freq: Four times a day (QID) | ORAL | Status: DC | PRN
  Administered 2021-10-25 (×2): 500 mg via ORAL
  Filled 2021-10-24 (×2): qty 1

## 2021-10-24 MED ORDER — TAMSULOSIN HCL 0.4 MG PO CAPS
0.8000 mg | ORAL_CAPSULE | Freq: Every day | ORAL | Status: DC
  Administered 2021-10-24 – 2021-10-25 (×2): 0.8 mg via ORAL
  Filled 2021-10-24 (×2): qty 2

## 2021-10-24 MED ORDER — LIDOCAINE 2% (20 MG/ML) 5 ML SYRINGE
INTRAMUSCULAR | Status: DC | PRN
  Administered 2021-10-24: 40 mg via INTRAVENOUS

## 2021-10-24 MED ORDER — BUPIVACAINE-EPINEPHRINE (PF) 0.5% -1:200000 IJ SOLN
INTRAMUSCULAR | Status: DC | PRN
  Administered 2021-10-24: 30 mL via PERINEURAL

## 2021-10-24 MED ORDER — CYCLOBENZAPRINE HCL 10 MG PO TABS
10.0000 mg | ORAL_TABLET | Freq: Every day | ORAL | Status: DC
  Administered 2021-10-24: 10 mg via ORAL
  Filled 2021-10-24: qty 1

## 2021-10-24 MED ORDER — BUPIVACAINE LIPOSOME 1.3 % IJ SUSP: 20.0000 mL | Freq: Once | INTRAMUSCULAR | Status: DC

## 2021-10-24 MED ORDER — METOCLOPRAMIDE HCL 5 MG PO TABS: 5.0000 mg | ORAL_TABLET | Freq: Three times a day (TID) | ORAL | Status: DC | PRN

## 2021-10-24 MED ORDER — ONDANSETRON HCL 4 MG/2ML IJ SOLN: 4.0000 mg | Freq: Once | INTRAMUSCULAR | Status: DC | PRN

## 2021-10-24 MED ORDER — MENTHOL 3 MG MT LOZG: 1.0000 | LOZENGE | OROMUCOSAL | Status: DC | PRN

## 2021-10-24 MED ORDER — CEFAZOLIN SODIUM-DEXTROSE 2-4 GM/100ML-% IV SOLN
2.0000 g | Freq: Four times a day (QID) | INTRAVENOUS | Status: AC
  Administered 2021-10-24 (×2): 2 g via INTRAVENOUS
  Filled 2021-10-24: qty 100

## 2021-10-24 MED ORDER — CHLORHEXIDINE GLUCONATE 0.12 % MT SOLN
15.0000 mL | Freq: Once | OROMUCOSAL | Status: AC
  Administered 2021-10-24: 15 mL via OROMUCOSAL

## 2021-10-24 MED ORDER — METHOCARBAMOL 500 MG IVPB - SIMPLE MED
INTRAVENOUS | Status: AC
  Filled 2021-10-24: qty 55

## 2021-10-24 MED ORDER — OXYCODONE HCL 5 MG PO TABS: 5.0000 mg | ORAL_TABLET | Freq: Four times a day (QID) | ORAL | 0 refills | Status: DC | PRN

## 2021-10-24 MED ORDER — ONDANSETRON HCL 4 MG/2ML IJ SOLN: 4.0000 mg | Freq: Four times a day (QID) | INTRAMUSCULAR | Status: DC | PRN

## 2021-10-24 MED ORDER — MIDAZOLAM HCL 2 MG/2ML IJ SOLN
INTRAMUSCULAR | Status: DC | PRN
  Administered 2021-10-24: 2 mg via INTRAVENOUS

## 2021-10-24 MED ORDER — ALUM & MAG HYDROXIDE-SIMETH 200-200-20 MG/5ML PO SUSP: 30.0000 mL | ORAL | Status: DC | PRN

## 2021-10-24 MED ORDER — HYDROMORPHONE HCL 2 MG/ML IJ SOLN
INTRAMUSCULAR | Status: AC
  Filled 2021-10-24: qty 1

## 2021-10-24 MED ORDER — BUPIVACAINE LIPOSOME 1.3 % IJ SUSP
INTRAMUSCULAR | Status: DC | PRN
  Administered 2021-10-24: 20 mL

## 2021-10-24 MED ORDER — PHENOL 1.4 % MT LIQD: 1.0000 | OROMUCOSAL | Status: DC | PRN

## 2021-10-24 MED ORDER — STERILE WATER FOR IRRIGATION IR SOLN
Status: DC | PRN
  Administered 2021-10-24: 2000 mL

## 2021-10-24 MED ORDER — METOPROLOL TARTRATE 25 MG PO TABS
25.0000 mg | ORAL_TABLET | Freq: Two times a day (BID) | ORAL | Status: DC
  Administered 2021-10-24 – 2021-10-25 (×2): 25 mg via ORAL
  Filled 2021-10-24 (×2): qty 1

## 2021-10-24 MED ORDER — MIDAZOLAM HCL 2 MG/2ML IJ SOLN
2.0000 mg | INTRAMUSCULAR | Status: AC
  Administered 2021-10-24: 1 mg via INTRAVENOUS
  Filled 2021-10-24: qty 2

## 2021-10-24 MED ORDER — LIDOCAINE HCL (PF) 2 % IJ SOLN
INTRAMUSCULAR | Status: AC
  Filled 2021-10-24: qty 5

## 2021-10-24 MED ORDER — SODIUM CHLORIDE (PF) 0.9 % IJ SOLN
INTRAMUSCULAR | Status: DC | PRN
  Administered 2021-10-24: 30 mL

## 2021-10-24 MED ORDER — CEFAZOLIN SODIUM-DEXTROSE 2-4 GM/100ML-% IV SOLN
2.0000 g | INTRAVENOUS | Status: AC
  Administered 2021-10-24: 2 g via INTRAVENOUS
  Filled 2021-10-24: qty 100

## 2021-10-24 MED ORDER — FENTANYL CITRATE (PF) 250 MCG/5ML IJ SOLN
INTRAMUSCULAR | Status: DC | PRN
  Administered 2021-10-24 (×5): 50 ug via INTRAVENOUS

## 2021-10-24 MED ORDER — ONDANSETRON HCL 4 MG PO TABS: 4.0000 mg | ORAL_TABLET | Freq: Four times a day (QID) | ORAL | Status: DC | PRN

## 2021-10-24 MED ORDER — SODIUM CHLORIDE (PF) 0.9 % IJ SOLN
INTRAMUSCULAR | Status: AC
  Filled 2021-10-24: qty 10

## 2021-10-24 MED ORDER — KETOROLAC TROMETHAMINE 15 MG/ML IJ SOLN
INTRAMUSCULAR | Status: AC
  Filled 2021-10-24: qty 1

## 2021-10-24 MED ORDER — KETAMINE HCL 10 MG/ML IJ SOLN
INTRAMUSCULAR | Status: DC | PRN
  Administered 2021-10-24 (×2): 10 mg via INTRAVENOUS
  Administered 2021-10-24: 30 mg via INTRAVENOUS

## 2021-10-24 MED ORDER — RIVAROXABAN 10 MG PO TABS
20.0000 mg | ORAL_TABLET | Freq: Every day | ORAL | Status: DC
  Administered 2021-10-25: 20 mg via ORAL
  Filled 2021-10-24: qty 2

## 2021-10-24 MED ORDER — OXYCODONE HCL 5 MG PO TABS
5.0000 mg | ORAL_TABLET | ORAL | Status: DC | PRN
  Administered 2021-10-25 (×3): 5 mg via ORAL
  Filled 2021-10-24 (×3): qty 1

## 2021-10-24 MED ORDER — TRANEXAMIC ACID 1000 MG/10ML IV SOLN
INTRAVENOUS | Status: DC | PRN
  Administered 2021-10-24: 2000 mg via TOPICAL

## 2021-10-24 MED ORDER — CEFAZOLIN SODIUM-DEXTROSE 2-4 GM/100ML-% IV SOLN
INTRAVENOUS | Status: AC
  Filled 2021-10-24: qty 100

## 2021-10-24 MED ORDER — METHOCARBAMOL 500 MG IVPB - SIMPLE MED
500.0000 mg | Freq: Four times a day (QID) | INTRAVENOUS | Status: DC | PRN
  Administered 2021-10-24: 500 mg via INTRAVENOUS

## 2021-10-24 MED ORDER — ONDANSETRON HCL 4 MG/2ML IJ SOLN
INTRAMUSCULAR | Status: DC | PRN
  Administered 2021-10-24: 4 mg via INTRAVENOUS

## 2021-10-24 MED ORDER — PROPOFOL 10 MG/ML IV BOLUS
INTRAVENOUS | Status: DC | PRN
  Administered 2021-10-24: 120 mg via INTRAVENOUS
  Administered 2021-10-24: 20 mg via INTRAVENOUS

## 2021-10-24 MED ORDER — TRANEXAMIC ACID 1000 MG/10ML IV SOLN
2000.0000 mg | INTRAVENOUS | Status: DC
  Filled 2021-10-24: qty 20

## 2021-10-24 MED ORDER — SODIUM CHLORIDE (PF) 0.9 % IJ SOLN
INTRAMUSCULAR | Status: AC
  Filled 2021-10-24: qty 30

## 2021-10-24 SURGICAL SUPPLY — 54 items
ATTUNE MED DOME PAT 38 KNEE (Knees) IMPLANT
ATTUNE PS FEM LT SZ 7 CEM KNEE (Femur) IMPLANT
ATTUNE PSRP INSR SZ7 10 KNEE (Insert) IMPLANT
BAG COUNTER SPONGE SURGICOUNT (BAG) ×1 IMPLANT
BAG DECANTER FOR FLEXI CONT (MISCELLANEOUS) ×1 IMPLANT
BAG ZIPLOCK 12X15 (MISCELLANEOUS) ×1 IMPLANT
BASE TIBIAL ATTUNE KNEE SZ9 (Knees) IMPLANT
BLADE SAGITTAL 25.0X1.19X90 (BLADE) ×1 IMPLANT
BLADE SAW SGTL 11.0X1.19X90.0M (BLADE) ×1 IMPLANT
BLADE SURG SZ10 CARB STEEL (BLADE) ×1 IMPLANT
BNDG ELASTIC 6X5.8 VLCR STR LF (GAUZE/BANDAGES/DRESSINGS) ×1 IMPLANT
BOOTIES KNEE HIGH SLOAN (MISCELLANEOUS) ×1 IMPLANT
BOWL SMART MIX CTS (DISPOSABLE) ×1 IMPLANT
CEMENT HV SMART SET (Cement) ×2 IMPLANT
COVER SURGICAL LIGHT HANDLE (MISCELLANEOUS) ×1 IMPLANT
CUFF TOURN SGL QUICK 34 (TOURNIQUET CUFF) ×1
CUFF TRNQT CYL 34X4.125X (TOURNIQUET CUFF) ×1 IMPLANT
DRAPE INCISE IOBAN 66X45 STRL (DRAPES) ×1 IMPLANT
DRAPE SHEET LG 3/4 BI-LAMINATE (DRAPES) ×1 IMPLANT
DRAPE TOP 10253 STERILE (DRAPES) ×1 IMPLANT
DRAPE U-SHAPE 47X51 STRL (DRAPES) ×1 IMPLANT
DRSG AQUACEL AG ADV 3.5X10 (GAUZE/BANDAGES/DRESSINGS) ×1 IMPLANT
DURAPREP 26ML APPLICATOR (WOUND CARE) ×2 IMPLANT
ELECT REM PT RETURN 15FT ADLT (MISCELLANEOUS) ×1 IMPLANT
GLOVE BIO SURGEON STRL SZ7 (GLOVE) IMPLANT
GLOVE BIO SURGEON STRL SZ7.5 (GLOVE) IMPLANT
GLOVE BIO SURGEON STRL SZ8 (GLOVE) ×2 IMPLANT
GLOVE BIOGEL PI IND STRL 7.0 (GLOVE) IMPLANT
GLOVE BIOGEL PI IND STRL 8 (GLOVE) ×2 IMPLANT
GOWN STRL REUS W/ TWL XL LVL3 (GOWN DISPOSABLE) ×2 IMPLANT
GOWN STRL REUS W/TWL XL LVL3 (GOWN DISPOSABLE) ×2
HANDPIECE INTERPULSE COAX TIP (DISPOSABLE) ×1
HOLDER FOLEY CATH W/STRAP (MISCELLANEOUS) IMPLANT
HOOD PEEL AWAY FLYTE STAYCOOL (MISCELLANEOUS) ×3 IMPLANT
KIT TURNOVER KIT A (KITS) IMPLANT
MANIFOLD NEPTUNE II (INSTRUMENTS) ×1 IMPLANT
NEEDLE HYPO 22GX1.5 SAFETY (NEEDLE) ×1 IMPLANT
NS IRRIG 1000ML POUR BTL (IV SOLUTION) ×1 IMPLANT
PACK TOTAL KNEE CUSTOM (KITS) ×1 IMPLANT
PAD ARMBOARD 7.5X6 YLW CONV (MISCELLANEOUS) ×1 IMPLANT
PROTECTOR NERVE ULNAR (MISCELLANEOUS) ×1 IMPLANT
SET HNDPC FAN SPRY TIP SCT (DISPOSABLE) ×1 IMPLANT
SPIKE FLUID TRANSFER (MISCELLANEOUS) ×2 IMPLANT
SUT ETHIBOND NAB CT1 #1 30IN (SUTURE) ×2 IMPLANT
SUT VIC AB 0 CT1 36 (SUTURE) ×1 IMPLANT
SUT VIC AB 2-0 CT1 27 (SUTURE) ×1
SUT VIC AB 2-0 CT1 TAPERPNT 27 (SUTURE) ×1 IMPLANT
SUT VICRYL AB 3-0 FS1 BRD 27IN (SUTURE) ×1 IMPLANT
SUT VLOC 180 0 24IN GS25 (SUTURE) ×1 IMPLANT
TIBIAL BASE ATTUNE KNEE SZ9 (Knees) ×1 IMPLANT
TRAY FOLEY MTR SLVR 16FR STAT (SET/KITS/TRAYS/PACK) IMPLANT
WATER STERILE IRR 1000ML POUR (IV SOLUTION) ×1 IMPLANT
WRAP KNEE MAXI GEL POST OP (GAUZE/BANDAGES/DRESSINGS) ×1 IMPLANT
YANKAUER SUCT BULB TIP NO VENT (SUCTIONS) ×1 IMPLANT

## 2021-10-24 NOTE — Transfer of Care (Signed)
Immediate Anesthesia Transfer of Care Note  Patient: Darren Harrison  Procedure(s) Performed: LEFT TOTAL KNEE ARTHROPLASTY (Left: Knee)  Patient Location: PACU  Anesthesia Type:GA combined with regional for post-op pain  Level of Consciousness: awake  Airway & Oxygen Therapy: Patient Spontanous Breathing and Patient connected to face mask oxygen  Post-op Assessment: Report given to RN and Post -op Vital signs reviewed and stable  Post vital signs: Reviewed and stable  Last Vitals:  Vitals Value Taken Time  BP 154/106 10/24/21 1445  Temp    Pulse 88 10/24/21 1445  Resp 17 10/24/21 1445  SpO2 97 % 10/24/21 1445  Vitals shown include unvalidated device data.  Last Pain:  Vitals:   10/24/21 1024  TempSrc: Oral         Complications: No notable events documented.

## 2021-10-24 NOTE — Anesthesia Procedure Notes (Signed)
Anesthesia Regional Block: Adductor canal block   Pre-Anesthetic Checklist: , timeout performed,  Correct Patient, Correct Site, Correct Laterality,  Correct Procedure,, site marked,  Risks and benefits discussed,  Surgical consent,  Pre-op evaluation,  At surgeon's request and post-op pain management  Laterality: Left  Prep: chloraprep       Needles:  Injection technique: Single-shot  Needle Type: Echogenic Stimulator Needle     Needle Length: 9cm  Needle Gauge: 21     Additional Needles:   Procedures:,,,, ultrasound used (permanent image in chart),,    Narrative:  Start time: 10/24/2021 10:50 AM End time: 10/24/2021 11:00 AM Injection made incrementally with aspirations every 5 mL.  Performed by: Personally  Anesthesiologist: Murvin Natal, MD  Additional Notes: Functioning IV was confirmed and monitors were applied. A time-out was performed. Hand hygiene and sterile gloves were used. The thigh was placed in a frog-leg position and prepped in a sterile fashion. A 59m 21ga Arrow echogenic stimulator needle was placed using ultrasound guidance.  Negative aspiration and negative test dose prior to incremental administration of local anesthetic. The patient tolerated the procedure well.

## 2021-10-24 NOTE — Op Note (Signed)
PREOP DIAGNOSIS: DJD LEFT KNEE POSTOP DIAGNOSIS:  same PROCEDURE: LEFT TKR ANESTHESIA: General ATTENDING SURGEON: [name] ASSISTANT: Loni Dolly PA  INDICATIONS FOR PROCEDURE: Darren Harrison is a 66 y.o. male who has struggled for a long time with pain due to degenerative arthritis of the left knee.  The patient has failed many conservative non-operative measures and at this point has pain which limits the ability to sleep and walk.  The patient is offered total knee replacement.  Informed operative consent was obtained after discussion of possible risks of anesthesia, infection, neurovascular injury, DVT, and death.  The importance of the post-operative rehabilitation protocol to optimize result was stressed extensively with the patient.  SUMMARY OF FINDINGS AND PROCEDURE:  Darren Harrison was taken to the operative suite where under the above anesthesia a left knee replacement was performed.  There were advanced degenerative changes and the bone quality was fair.  We used the DePuyAttune system and placed size 7 femur, 9 tibia, 38 mm all polyethylene patella, and a size 10 mm spacer.  Loni Dolly PA-C assisted throughout and was invaluable to the completion of the case in that he helped retract and maintain exposure while I placed the components.  He also helped close thereby minimizing OR time.  The patient was admitted for appropriate post-op care to include perioperative antibiotics and mechanical and pharmacologic measures for DVT prophylaxis.  DESCRIPTION OF PROCEDURE:  Darren Harrison was taken to the operative suite where the above anesthesia was applied.  The patient was positioned supine and prepped and draped in normal sterile fashion.  An appropriate time out was performed.  After the administration of kefzol pre-op antibiotic the leg was elevated and exsanguinated and a tourniquet inflated.  A standard longitudinal incision was made on the anterior knee.  Dissection was carried  down to the extensor mechanism.  All appropriate anti-infective measures were used including the pre-operative antibiotic, betadine impregnated drape, and closed hooded exhaust systems for each member of the surgical team.  A medial parapatellar incision was made in the extensor mechanism and the knee cap flipped and the knee flexed.  Some residual meniscal tissues were removed along with any remaining ACL/PCL tissue.  A guide was placed on the tibia and a flat cut was made on it's superior surface.  An intramedullary guide was placed in the femur and was utilized to make anterior and posterior cuts creating an appropriate flexion gap.  A second intramedullary guide was placed in the femur to make a distal cut properly balancing the knee with an extension gap equal to the flexion gap.  The three bones sized to the above mentioned sizes and the appropriate guides were placed and utilized.  A trial reduction was done and the knee easily came to full extension and the patella tracked well on flexion.  The trial components were removed and all bones were cleaned with pulsatile lavage and then dried thoroughly.  Cement was mixed and was pressurized onto the bones followed by placement of the aforementioned components.  Excess cement was trimmed and pressure was held on the components until the cement had hardened.  The tourniquet was deflated and a small amount of bleeding was controlled with cautery and pressure.  The knee was irrigated thoroughly.  The extensor mechanism was re-approximated with #1 ethibond in interrupted fashion.  The knee was flexed and the repair was solid.  The subcutaneous tissues were re-approximated with #0 and #2-0 vicryl and the skin closed with  a subcuticular stitch and steristrips.  A sterile dressing was applied.  Intraoperative fluids, EBL, and tourniquet time can be obtained from anesthesia records.  DISPOSITION:  The patient was taken to recovery room in stable condition and scheduled  to potentially go home same day depending on ability to walk and tolerate liquids.  [name] 10/24/2021, 2:10 PM

## 2021-10-24 NOTE — Anesthesia Procedure Notes (Addendum)
Date/Time: 10/24/2021 12:26 PM  Performed by: Sharlette Dense, CRNAOxygen Delivery Method: Simple face mask

## 2021-10-24 NOTE — Anesthesia Preprocedure Evaluation (Addendum)
Anesthesia Evaluation  Patient identified by MRN, date of birth, ID band Patient awake    Reviewed: Allergy & Precautions, NPO status , Patient's Chart, lab work & pertinent test results  Airway Mallampati: II  TM Distance: >3 FB Neck ROM: Full    Dental  (+) Poor Dentition, Missing   Pulmonary neg pulmonary ROS,    Pulmonary exam normal        Cardiovascular hypertension, Pt. on home beta blockers Normal cardiovascular exam+ dysrhythmias Atrial Fibrillation      Neuro/Psych negative psych ROS   GI/Hepatic GERD  ,Fatty liver   Endo/Other  Hypothyroidism   Renal/GU negative Renal ROS     Musculoskeletal  (+) Arthritis , Limited mobility. Uses wheelchair   Abdominal (+) + obese,   Peds  Hematology  (+) Blood dyscrasia, ,   Anesthesia Other Findings LEFT KNEE DEGENERATIVE JOINT DISEASE  Reproductive/Obstetrics                            Anesthesia Physical Anesthesia Plan  ASA: 3  Anesthesia Plan: Regional and Spinal   Post-op Pain Management:    Induction: Intravenous  PONV Risk Score and Plan: 1 and Ondansetron, Dexamethasone, Propofol infusion, Midazolam and Treatment may vary due to age or medical condition  Airway Management Planned: Simple Face Mask  Additional Equipment:   Intra-op Plan:   Post-operative Plan:   Informed Consent: I have reviewed the patients History and Physical, chart, labs and discussed the procedure including the risks, benefits and alternatives for the proposed anesthesia with the patient or authorized representative who has indicated his/her understanding and acceptance.     Dental advisory given  Plan Discussed with: CRNA  Anesthesia Plan Comments:         Anesthesia Quick Evaluation

## 2021-10-24 NOTE — Anesthesia Postprocedure Evaluation (Signed)
Anesthesia Post Note  Patient: Darren Harrison  Procedure(s) Performed: LEFT TOTAL KNEE ARTHROPLASTY (Left: Knee)     Patient location during evaluation: PACU Anesthesia Type: Regional and General Level of consciousness: awake Pain management: pain level controlled Vital Signs Assessment: post-procedure vital signs reviewed and stable Respiratory status: spontaneous breathing, nonlabored ventilation, respiratory function stable and patient connected to nasal cannula oxygen Cardiovascular status: blood pressure returned to baseline and stable Postop Assessment: no apparent nausea or vomiting Anesthetic complications: no   No notable events documented.  Last Vitals:  Vitals:   10/24/21 1815 10/24/21 1850  BP: (!) 139/97 (!) 138/104  Pulse: (!) 59 69  Resp: 14 14  Temp:  36.4 C  SpO2: 94% 92%    Last Pain:  Vitals:   10/24/21 1850  TempSrc: Oral  PainSc:                  [name] P [name]

## 2021-10-24 NOTE — Plan of Care (Signed)

## 2021-10-24 NOTE — Interval H&P Note (Signed)
History and Physical Interval Note:  10/24/2021 11:28 AM  Darren Harrison  has presented today for surgery, with the diagnosis of LEFT KNEE DEGENERATIVE JOINT DISEASE.  The various methods of treatment have been discussed with the patient and family. After consideration of risks, benefits and other options for treatment, the patient has consented to  Procedure(s): LEFT TOTAL KNEE ARTHROPLASTY (Left) as a surgical intervention.  The patient's history has been reviewed, patient examined, no change in status, stable for surgery.  I have reviewed the patient's chart and labs.  Questions were answered to the patient's satisfaction.     [name]

## 2021-10-24 NOTE — Anesthesia Procedure Notes (Signed)
Procedure Name: LMA Insertion Date/Time: 10/24/2021 12:42 PM  Performed by: Sharlette Dense, CRNAPatient Re-evaluated:Patient Re-evaluated prior to induction Oxygen Delivery Method: Circle system utilized Preoxygenation: Pre-oxygenation with 100% oxygen Induction Type: IV induction LMA: LMA inserted LMA Size: 4.0 Number of attempts: 1 Placement Confirmation: positive ETCO2 and breath sounds checked- equal and bilateral Tube secured with: Tape Dental Injury: Teeth and Oropharynx as per pre-operative assessment

## 2021-10-25 ENCOUNTER — Encounter (HOSPITAL_COMMUNITY): Payer: Self-pay | Admitting: Orthopaedic Surgery

## 2021-10-25 DIAGNOSIS — M1712 Unilateral primary osteoarthritis, left knee: Secondary | ICD-10-CM | POA: Diagnosis not present

## 2021-10-25 NOTE — Progress Notes (Signed)
Subjective: 1 Day Post-Op Procedure(s) (LRB): LEFT TOTAL KNEE ARTHROPLASTY (Left)  Patient is doing well this morning. He is hoping to go home today.  Activity level:  wbat Diet tolerance:  ok Voiding:  ok Patient reports pain as mild.    Objective: Vital signs in last 24 hours: Temp:  [97.2 F (36.2 C)-98.3 F (36.8 C)] 97.6 F (36.4 C) (09/13 9774) Pulse Rate:  [54-87] 57 (09/13 0632) Resp:  [13-25] 16 (09/13 1423) BP: (110-156)/(70-106) 110/81 (09/13 9532) SpO2:  [91 %-99 %] 92 % (09/13 0233) Weight:  [113.4 kg] 113.4 kg (09/12 1034)  Labs: No results for input(s): "HGB" in the last 72 hours. No results for input(s): "WBC", "RBC", "HCT", "PLT" in the last 72 hours. No results for input(s): "NA", "K", "CL", "CO2", "BUN", "CREATININE", "GLUCOSE", "CALCIUM" in the last 72 hours. Recent Labs    10/24/21 1031  INR 1.2    Physical Exam:  Neurologically intact ABD soft Neurovascular intact Sensation intact distally Intact pulses distally Dorsiflexion/Plantar flexion intact Incision: dressing C/D/I and no drainage No cellulitis present Compartment soft  Assessment/Plan:  1 Day Post-Op Procedure(s) (LRB): LEFT TOTAL KNEE ARTHROPLASTY (Left) Advance diet Up with therapy D/C IV fluids Discharge home with home health today after being cleared by PT and if doing well.  Continue on baseline xarelto. Follow up in office 2 weeks post op.  Darren Harrison 10/25/2021, 8:02 AM

## 2021-10-25 NOTE — Progress Notes (Signed)
Physical Therapy Treatment Patient Details Name: Darren Harrison MRN: [id number] DOB: 1955/08/12 Today's Date: 10/25/2021   History of Present Illness Pt is a 66yo male presenting s/p L-TKA on 10/24/21. 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    PT Comments    Pt seen for second visit POD1, upon entry pt reporting that L knee aquacell is draining blood, RN provided additional bandaging for session. Pt required min assist for transfers, min guard for ambulation in hallway with RW distance limited by fatigue and pain. Pt completed supine HEP with minimal cuing and safe form. All education completed and pt nor wife have further questions. Pt has met mobility goals for safe discharge home with wife supervision/assist. PT is signing off, should needs change please reconsult. Thank you for this referral.     Recommendations for follow up therapy are one component of a multi-disciplinary discharge planning process, led by the attending physician.  Recommendations may be updated based on patient status, additional functional criteria and insurance authorization.  Follow Up Recommendations  Follow physician's recommendations for discharge plan and follow up therapies     Assistance Recommended at Discharge Intermittent Supervision/Assistance  Patient can return home with the following A little help with walking and/or transfers;A little help with bathing/dressing/bathroom;Assistance with cooking/housework;Assist for transportation;Help with stairs or ramp for entrance   Equipment Recommendations  None recommended by PT (Pt has recommended DME)    Recommendations for Other Services       Precautions / Restrictions Precautions Precautions: Fall Restrictions Weight Bearing Restrictions: No Other Position/Activity Restrictions: wbat      Mobility  Bed Mobility Overal bed mobility: Needs Assistance Bed Mobility: Supine to Sit     Supine to sit: Supervision     General bed mobility comments: Pt OOB in recliner at entry and exit    Transfers Overall transfer level: Needs assistance Equipment used: Rolling walker (2 wheels) Transfers: Sit to/from Stand Sit to Stand: Min assist           General transfer comment: Pt required min guard with VCs for sequencing and BUE powering up for first transfer. Additional transfers required very light min assist.    Ambulation/Gait Ambulation/Gait assistance: Min guard, +2 safety/equipment Gait Distance (Feet): 60 Feet Assistive device: Rolling walker (2 wheels) Gait Pattern/deviations: Step-to pattern, Decreased stance time - left, Decreased step length - left, Decreased weight shift to left Gait velocity: decreased     General Gait Details: Pt ambulated with RW and min guard +2 for recliner follow, no physical assist required or overt LOB noted, distance limited by fatigue and pain.   Stairs             Wheelchair Mobility    Modified Rankin (Stroke Patients Only)       Balance Overall balance assessment: Needs assistance Sitting-balance support: Feet supported, No upper extremity supported Sitting balance-Leahy Scale: Good     Standing balance support: Reliant on assistive device for balance, During functional activity, Bilateral upper extremity supported Standing balance-Leahy Scale: Poor                              Cognition Arousal/Alertness: Awake/alert Behavior During Therapy: WFL for tasks assessed/performed Overall Cognitive Status: Within Functional Limits for tasks assessed  Exercises Total Joint Exercises Ankle Circles/Pumps: AROM, Both, 10 reps Quad Sets: AROM, Left, 10 reps Heel Slides: AROM, Left, 10 reps Hip ABduction/ADduction: AROM, Left, 10  reps Straight Leg Raises: AROM, Left, 10 reps Long Arc Quad: AROM, Left, 10 reps Goniometric ROM: -5-90deg by visual approximation    General Comments General comments (skin integrity, edema, etc.): Wife present      Pertinent Vitals/Pain Pain Assessment Pain Assessment: 0-10 Pain Score: 5  Pain Location: L knee Pain Descriptors / Indicators: Operative site guarding, Discomfort, Cramping Pain Intervention(s): Limited activity within patient's tolerance, Monitored during session, Repositioned, Ice applied    Home Living Family/patient expects to be discharged to:: Private residence Living Arrangements: Spouse/significant other Available Help at Discharge: Family;Available 24 hours/day Type of Home: House Home Access: Level entry     Alternate Level Stairs-Number of Steps: full flight Home Layout: Two level;Able to live on main level with bedroom/bathroom Home Equipment: Toilet riser;Grab bars - toilet;Grab bars - tub/shower;Wheelchair - Publishing copy (2 wheels);Shower seat      Prior Function            PT Goals (current goals can now be found in the care plan section) Acute Rehab PT Goals Patient Stated Goal: To walk further and ambulate more (use WC less) PT Goal Formulation: With patient Time For Goal Achievement: 11/01/21 Potential to Achieve Goals: Good Progress towards PT goals: Progressing toward goals    Frequency    7X/week      PT Plan Current plan remains appropriate    Co-evaluation              AM-PAC PT "6 Clicks" Mobility   Outcome Measure  Help needed turning from your back to your side while in a flat bed without using bedrails?: A Little Help needed moving from lying on your back to sitting on the side of a flat bed without using bedrails?: A Little Help needed moving to and from a bed to a chair (including a wheelchair)?: A Little Help needed standing up from a chair using your arms (e.g., wheelchair or bedside chair)?: A  Little Help needed to walk in hospital room?: A Little Help needed climbing 3-5 steps with a railing? : A Little 6 Click Score: 18    End of Session Equipment Utilized During Treatment: Gait belt Activity Tolerance: Patient tolerated treatment well;No increased pain Patient left: in chair;with call bell/phone within reach;with chair alarm set;with family/visitor present Nurse Communication: Mobility status PT Visit Diagnosis: Pain;Difficulty in walking, not elsewhere classified (R26.2) Pain - Right/Left: Left Pain - part of body: Knee     Time: 9539-6728 PT Time Calculation (min) (ACUTE ONLY): 22 min  Charges:  $Gait Training: 8-22 mins                    Coolidge Breeze, PT, DPT [facility] Department Office: [phone]   Coolidge Breeze 10/25/2021, 3:49 PM

## 2021-10-25 NOTE — Plan of Care (Signed)
  Problem: Education: Goal: Knowledge of the prescribed therapeutic regimen will improve Outcome: Adequate for Discharge Goal: Individualized Educational Video(s) Outcome: Adequate for Discharge   Problem: Activity: Goal: Ability to avoid complications of mobility impairment will improve Outcome: Adequate for Discharge Goal: Range of joint motion will improve Outcome: Adequate for Discharge   Problem: Clinical Measurements: Goal: Postoperative complications will be avoided or minimized Outcome: Adequate for Discharge   Problem: Pain Management: Goal: Pain level will decrease with appropriate interventions Outcome: Adequate for Discharge   Problem: Skin Integrity: Goal: Will show signs of wound healing Outcome: Adequate for Discharge   Problem: Education: Goal: Knowledge of General Education information will improve Description: Including pain rating scale, medication(s)/side effects and non-pharmacologic comfort measures Outcome: Adequate for Discharge   Problem: Health Behavior/Discharge Planning: Goal: Ability to manage health-related needs will improve Outcome: Adequate for Discharge   Problem: Clinical Measurements: Goal: Ability to maintain clinical measurements within normal limits will improve Outcome: Adequate for Discharge Goal: Will remain free from infection Outcome: Adequate for Discharge Goal: Diagnostic test results will improve Outcome: Adequate for Discharge Goal: Respiratory complications will improve Outcome: Adequate for Discharge Goal: Cardiovascular complication will be avoided Outcome: Adequate for Discharge   Problem: Activity: Goal: Risk for activity intolerance will decrease Outcome: Adequate for Discharge   Problem: Nutrition: Goal: Adequate nutrition will be maintained Outcome: Adequate for Discharge   Problem: Coping: Goal: Level of anxiety will decrease Outcome: Adequate for Discharge   Problem: Elimination: Goal: Will not  experience complications related to bowel motility Outcome: Adequate for Discharge Goal: Will not experience complications related to urinary retention Outcome: Adequate for Discharge   Problem: Pain Managment: Goal: General experience of comfort will improve Outcome: Adequate for Discharge   Problem: Safety: Goal: Ability to remain free from injury will improve Outcome: Adequate for Discharge   Problem: Skin Integrity: Goal: Risk for impaired skin integrity will decrease Outcome: Adequate for Discharge   Problem: Acute Rehab PT Goals(only PT should resolve) Goal: Pt Will Ambulate Outcome: Adequate for Discharge Goal: Pt/caregiver will Perform Home Exercise Program Outcome: Adequate for Discharge

## 2021-10-25 NOTE — Progress Notes (Signed)
Patient up with PT l knee aquacell with moderate drainage. A Nida PA notified with orders received. Bethann Punches RN

## 2021-10-25 NOTE — Discharge Summary (Signed)
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 blood thinner medication.  It is important for you to complete the blood thinner medication as prescribed.  PRECAUTIONS:  If you experience chest pain or shortness of breath - call 911 immediately for transfer to the hospital emergency department.   If you develop a fever greater that 101 F, purulent drainage from wound, increased redness or drainage from wound, foul odor from the wound/dressing, or calf pain - CONTACT YOUR SURGEON.                                                   FOLLOW-UP APPOINTMENTS:  If you do not already have a post-op appointment, please call the office for an appointment to be seen by your surgeon.  Guidelines for  how soon to be seen are listed in your "After Visit Summary", but are typically between 1-4 weeks after surgery.  OTHER INSTRUCTIONS:   Knee Replacement:  Do not place pillow under knee, focus on keeping the knee straight  while resting. CPM instructions: 0-90 degrees, 2 hours in the morning, 2 hours in the afternoon, and 2 hours in the evening. Place foam block, curve side up under heel at all times except when in CPM or when walking.  DO NOT modify, tear, cut, or change the foam block in any way.  POST-OPERATIVE OPIOID TAPER INSTRUCTIONS: It is important to wean off of your opioid medication as soon as possible. If you do not need pain medication after your surgery it is ok to stop day one. Opioids include: Codeine, Hydrocodone(Norco, Vicodin), Oxycodone(Percocet, oxycontin) and hydromorphone amongst others.  Long term and even short term use of opiods can cause: Increased pain response Dependence Constipation Depression Respiratory depression And more.  Withdrawal symptoms can include Flu like symptoms Nausea, vomiting And more Techniques to manage these symptoms Hydrate well Eat regular healthy meals Stay active Use relaxation techniques(deep breathing, meditating, yoga) Do Not substitute Alcohol to help with tapering If you have been on opioids for less than two weeks and do not have pain than it is ok to stop all together.  Plan to wean off of opioids This plan should start within one week post op of your joint replacement. Maintain the same interval or time between taking each dose and first decrease the dose.  Cut the total daily intake of opioids by one tablet each day Next start to increase the time between doses. The last dose that should be eliminated is the evening dose.     MAKE SURE YOU:  Understand these instructions.  Get help right away if you are not doing well or get worse.    Thank you for letting us be a part of your medical care team.  It is a privilege we respect greatly.  We hope these instructions will help you stay on track for a fast and full recovery!   Increase activity slowly as tolerated   Complete by: As directed    Increase activity slowly as tolerated    Complete by: As directed    Post-operative opioid taper instructions:   Complete by: As directed    POST-OPERATIVE OPIOID TAPER INSTRUCTIONS: It is important to wean off of your opioid medication as soon as possible. If you do not need pain medication after your surgery it is ok to stop day one. Opioids include: Codeine, Hydrocodone(Norco, Vicodin), Oxycodone(Percocet, oxycontin) and hydromorphone amongst others.  Long term and even short term use of opiods can cause: Increased pain response Dependence Constipation Depression Respiratory depression And more.  Withdrawal symptoms can include Flu like symptoms Nausea, vomiting And more Techniques to manage these symptoms Hydrate well Eat regular healthy meals Stay active Use relaxation techniques(deep breathing, meditating, yoga) Do Not substitute Alcohol to help with tapering If you have been on opioids for less than two weeks and do not have pain than it is ok to stop all together.  Plan to wean off of opioids This plan should start within one week post op of your joint replacement. Maintain the same interval or time between taking each dose and first decrease the dose.  Cut the total daily intake of opioids by one tablet each day Next start to increase the time between doses. The last dose that should be eliminated is the  evening dose.      Post-operative opioid taper instructions:   Complete by: As directed    POST-OPERATIVE OPIOID TAPER INSTRUCTIONS: It is important to wean off of your opioid medication as soon as possible. If you do not need pain medication after your surgery it is ok to stop day one. Opioids include: Codeine, Hydrocodone(Norco, Vicodin), Oxycodone(Percocet, oxycontin) and hydromorphone amongst others.  Long term and even short term use of opiods can cause: Increased pain response Dependence Constipation Depression Respiratory depression And more.  Withdrawal symptoms can include Flu like  symptoms Nausea, vomiting And more Techniques to manage these symptoms Hydrate well Eat regular healthy meals Stay active Use relaxation techniques(deep breathing, meditating, yoga) Do Not substitute Alcohol to help with tapering If you have been on opioids for less than two weeks and do not have pain than it is ok to stop all together.  Plan to wean off of opioids This plan should start within one week post op of your joint replacement. Maintain the same interval or time between taking each dose and first decrease the dose.  Cut the total daily intake of opioids by one tablet each day Next start to increase the time between doses. The last dose that should be eliminated is the evening dose.           Follow-up Information     Melrose Nakayama, MD. Schedule an appointment as soon as possible for a visit in 2 week(s).   Specialty: Orthopedic Surgery Contact information: [place] [address] [phone]                  Signed: Larwance Sachs Arnold Kester 10/25/2021, 8:04 AM

## 2021-10-25 NOTE — TOC Initial Note (Signed)
Transition of Care [facility]) - Initial/Assessment Note    Patient Details  Name: Darren Harrison MRN: [id number] Date of Birth: 06-Feb-1956  Transition of Care (TOC) CM/SW Contact:    Joaquin Courts, RN Phone Number: 10/25/2021, 11:42 AM  Clinical Narrative:                 CM spoke with patient regarding HHPT, patient reports he has been set up with [facility] in [place].  CM contacted agency and confirmed patient set to be seen for services tomorrow 9/14.  Patient reports has all needed DME, no further TOC needs identified. Copy of H&P faxed to agency per request.  Expected Discharge Plan: [place] Barriers to Discharge: No Barriers Identified   Patient Goals and CMS Choice Patient states their goals for this hospitalization and ongoing recovery are:: to go home with therapy CMS Medicare.gov Compare Post Acute Care list provided to:: Patient Choice offered to / list presented to : Patient  Expected Discharge Plan and Services Expected Discharge Plan: [place]   Discharge Planning Services: CM Consult Post Acute Care Choice: Resumption of Svcs/PTA Provider Living arrangements for the past 2 months: Single Family Home Expected Discharge Date: 10/25/21               DME Arranged: N/A DME Agency: NA       HH Arranged: PT HH Agency: [place] Date [place]: 10/25/21 Time [place]: 1975 Representative spoke with at [place]: main line  Prior Living Arrangements/Services Living arrangements for the past 2 months: [place] with:: Spouse Patient language and need for interpreter reviewed:: Yes Do you feel safe going back to the place where you live?: Yes      Need for Family Participation in Patient Care: Yes (Comment) Care giver support system in place?: Yes (comment)   Criminal Activity/Legal Involvement Pertinent to Current Situation/Hospitalization: No - Comment as  needed  Activities of Daily Living Home Assistive Devices/Equipment: Walker (specify type), Cane (specify quad or straight), Bedside commode/3-in-1, Raised toilet seat with rails, Eyeglasses (front wheel walker) ADL Screening (condition at time of admission) Patient's cognitive ability adequate to safely complete daily activities?: Yes Is the patient deaf or have difficulty hearing?: No Does the patient have difficulty seeing, even when wearing glasses/contacts?: No Does the patient have difficulty concentrating, remembering, or making decisions?: No Patient able to express need for assistance with ADLs?: Yes Does the patient have difficulty dressing or bathing?: No Independently performs ADLs?: Yes (appropriate for developmental age) Does the patient have difficulty walking or climbing stairs?: Yes Weakness of Legs: Both Weakness of Arms/Hands: None  Permission Sought/Granted                  Emotional Assessment Appearance:: Appears stated age Attitude/Demeanor/Rapport: Engaged Affect (typically observed): Accepting Orientation: : Oriented to Self, Oriented to Place, Oriented to  Time, Oriented to Situation   Psych Involvement: No (comment)  Admission diagnosis:  S/P TKR (total knee replacement), left [Z96.652] Patient Active Problem List   Diagnosis Date Noted   Primary osteoarthritis of left knee 10/24/2021   S/P TKR (total knee replacement), left 10/24/2021   S/P total knee arthroplasty 06/21/2021   Primary localized osteoarthritis of right knee 06/20/2021   Hypothyroidism following radioiodine therapy 03/20/2021   Quadriplegia ([place]) 08/26/2020   Wheelchair dependence 08/26/2020   Neurogenic bowel 07/05/2020   Neurogenic bladder 07/05/2020   Spasticity 07/05/2020  Pressure injury of skin 06/29/2020   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 [place] [address] Phone: [phone] Fax: [phone]  [facility] [address] [place] [address] Phone: [phone] Fax: [phone]     Social Determinants of Health (SDOH) Interventions    Readmission Risk Interventions     No data to display

## 2021-10-25 NOTE — Evaluation (Signed)
Physical Therapy Evaluation Patient Details Name: Darren Harrison MRN: [id number] DOB: [birth year] Today's Date: 10/25/2021  History of Present Illness  Pt is a 66yo male presenting s/p L-TKA on 10/24/21. 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   Clinical Impression  Darren Harrison is a 66 y.o. male POD1 s/p L-TKA. Patient reports modified independence using WC for mobility at baseline. Patient is now limited by functional impairments (see PT problem list below) and requires supervision for bed mobility and min assist for transfers. Patient was able to ambulate 30 feet with RW and min guard level of assist. Patient instructed in exercise to facilitate ROM and circulation. Patient will benefit from continued skilled PT interventions to address impairments and progress towards PLOF. Acute PT will follow to progress mobility and HEP in preparation for safe discharge home.     Recommendations for follow up therapy are one component of a multi-disciplinary discharge planning process, led by the attending physician.  Recommendations may be updated based on patient status, additional functional criteria and insurance authorization.  Follow Up Recommendations Follow physician's recommendations for discharge plan and follow up therapies      Assistance Recommended at Discharge Intermittent Supervision/Assistance  Patient can return home with the following  A little help with walking and/or transfers;A little help with bathing/dressing/bathroom;Assistance with cooking/housework;Assist for transportation;Help with stairs or ramp for entrance    Equipment Recommendations None recommended by PT (Pt has recommended DME)  Recommendations for Other Services       Functional Status Assessment Patient has had a recent decline in their functional  status and demonstrates the ability to make significant improvements in function in a reasonable and predictable amount of time.     Precautions / Restrictions Precautions Precautions: Fall Restrictions Weight Bearing Restrictions: No Other Position/Activity Restrictions: wbat      Mobility  Bed Mobility Overal bed mobility: Needs Assistance Bed Mobility: Supine to Sit     Supine to sit: Supervision     General bed mobility comments: For safety, no physical assist required    Transfers Overall transfer level: Needs assistance Equipment used: Rolling walker (2 wheels) Transfers: Sit to/from Stand Sit to Stand: From elevated surface, Min assist           General transfer comment: Pt required min assist for mild lift assistance. Upon first attempt at standing, min guard provided, pt exclaiming in pain, returned to sitting. Provided additional multimodal cuing, min assist for lift assist, Pt able to stand. Completed lateral weight shifts and 1-2 standing marches in walker.    Ambulation/Gait Ambulation/Gait assistance: Min guard, +2 safety/equipment Gait Distance (Feet): 30 Feet Assistive device: Rolling walker (2 wheels) Gait Pattern/deviations: Step-to pattern, Decreased stance time - left, Decreased step length - left, Decreased weight shift to left Gait velocity: decreased     General Gait Details: Pt ambulated with RW and min guard +2 for recliner follow, no physical assist required or overt LOB noted, distance limited by fatigue and pain.  Stairs            Wheelchair Mobility    Modified Rankin (Stroke Patients Only)       Balance Overall balance assessment: Needs assistance Sitting-balance support: Feet supported, No upper extremity supported Sitting balance-Leahy Scale: Good     Standing balance support: Reliant on assistive device for balance, During functional activity, Bilateral upper extremity supported  Standing balance-Leahy Scale: Poor                                Pertinent Vitals/Pain Pain Assessment Pain Assessment: 0-10 Pain Score: 6  Pain Location: L knee Pain Descriptors / Indicators: Operative site guarding, Discomfort, Cramping Pain Intervention(s): Limited activity within patient's tolerance, Monitored during session, Repositioned, Ice applied    Home Living Family/patient expects to be discharged to:: Private residence Living Arrangements: Spouse/significant other Available Help at Discharge: Family;Available 24 hours/day Type of Home: House Home Access: Level entry     Alternate Level Stairs-Number of Steps: full flight Home Layout: Two level;Able to live on main level with bedroom/bathroom Home Equipment: Toilet riser;Grab bars - toilet;Grab bars - tub/shower;Wheelchair - Publishing copy (2 wheels);Shower seat      Prior Function Prior Level of Function : Independent/Modified Independent             Mobility Comments: Uses WC at baseline, at PT walked 166f with RW ADLs Comments: Supervision for bathing     Hand Dominance   Dominant Hand: Right    Extremity/Trunk Assessment   Upper Extremity Assessment Upper Extremity Assessment: Overall WFL for tasks assessed    Lower Extremity Assessment Lower Extremity Assessment: LLE deficits/detail;RLE deficits/detail RLE Deficits / Details: MMT ank DF/PF 5/5 RLE Sensation: WNL LLE Deficits / Details: MMT ank DF/PF 5/5, no extensor lag noted LLE Sensation: WNL    Cervical / Trunk Assessment Cervical / Trunk Assessment: Neck Surgery;Back Surgery;Kyphotic  Communication   Communication: No difficulties  Cognition Arousal/Alertness: Awake/alert Behavior During Therapy: WFL for tasks assessed/performed Overall Cognitive Status: Within Functional Limits for tasks assessed                                          General Comments General comments (skin integrity, edema, etc.): Wife PSilva Bandypresent     Exercises Total Joint Exercises Ankle Circles/Pumps: AROM, Both, 10 reps Quad Sets: AROM, Left, 10 reps Heel Slides: AROM, Left, 10 reps Goniometric ROM: -5-90deg by visual approximation   Assessment/Plan    PT Assessment Patient needs continued PT services  PT Problem List Decreased strength;Decreased range of motion;Decreased activity tolerance;Decreased balance;Decreased mobility;Decreased coordination;Pain       PT Treatment Interventions DME instruction;Gait training;Stair training;Functional mobility training;Therapeutic activities;Therapeutic exercise;Balance training;Neuromuscular re-education;Patient/family education    PT Goals (Current goals can be found in the Care Plan section)  Acute Rehab PT Goals Patient Stated Goal: To walk further and ambulate more (use WC less) PT Goal Formulation: With patient Time For Goal Achievement: 11/01/21 Potential to Achieve Goals: Good    Frequency 7X/week     Co-evaluation               AM-PAC PT "6 Clicks" Mobility  Outcome Measure Help needed turning from your back to your side while in a flat bed without using bedrails?: A Little Help needed moving from lying on your back to sitting on the side of a flat bed without using bedrails?: A Little Help needed moving to and from a bed to a chair (including a wheelchair)?: A Little Help needed standing up from a chair using your arms (e.g., wheelchair or bedside chair)?: A Little Help needed to walk in hospital room?: A Little Help needed climbing 3-5 steps with a railing? : A Little 6 Click Score:  18    End of Session Equipment Utilized During Treatment: Gait belt Activity Tolerance: Patient tolerated treatment well;No increased pain Patient left: in chair;with call bell/phone within reach;with chair alarm set;with nursing/sitter in room;with family/visitor present Nurse Communication: Mobility status PT Visit Diagnosis: Pain;Difficulty in walking, not elsewhere classified  (R26.2) Pain - Right/Left: Left Pain - part of body: Knee    Time: 1505-6979 PT Time Calculation (min) (ACUTE ONLY): 24 min   Charges:   PT Evaluation $PT Eval Low Complexity: 1 Low PT Treatments $Gait Training: 8-22 mins        Coolidge Breeze, PT, DPT [facility] Department Office: [phone]  Coolidge Breeze 10/25/2021, 10:29 AM

## 2021-10-25 NOTE — Progress Notes (Signed)
Notified A Nida PA of drainage Left Knee orders received. Bethann Punches RN

## 2021-10-28 ENCOUNTER — Other Ambulatory Visit: Payer: Self-pay | Admitting: Physical Medicine and Rehabilitation

## 2021-11-27 ENCOUNTER — Other Ambulatory Visit: Payer: Self-pay | Admitting: Physical Medicine and Rehabilitation

## 2021-12-12 LAB — TSH: TSH: 16.6 u[IU]/mL — ABNORMAL HIGH (ref 0.450–4.500)

## 2021-12-12 LAB — T4, FREE: Free T4: 1.17 ng/dL (ref 0.82–1.77)

## 2021-12-19 ENCOUNTER — Encounter: Payer: Self-pay | Admitting: "Endocrinology

## 2021-12-19 ENCOUNTER — Ambulatory Visit (INDEPENDENT_AMBULATORY_CARE_PROVIDER_SITE_OTHER): Payer: Medicare Other | Admitting: "Endocrinology

## 2021-12-19 VITALS — BP 144/100 | HR 80 | Ht 70.0 in | Wt 256.0 lb

## 2021-12-19 DIAGNOSIS — E890 Postprocedural hypothyroidism: Secondary | ICD-10-CM | POA: Diagnosis not present

## 2021-12-19 DIAGNOSIS — E0500 Thyrotoxicosis with diffuse goiter without thyrotoxic crisis or storm: Secondary | ICD-10-CM | POA: Diagnosis not present

## 2021-12-19 DIAGNOSIS — I2510 Atherosclerotic heart disease of native coronary artery without angina pectoris: Secondary | ICD-10-CM

## 2021-12-19 DIAGNOSIS — I10 Essential (primary) hypertension: Secondary | ICD-10-CM | POA: Insufficient documentation

## 2021-12-19 MED ORDER — LEVOTHYROXINE SODIUM 200 MCG PO TABS: 200.0000 ug | ORAL_TABLET | Freq: Every day | ORAL | 1 refills | Status: DC
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
## 2022-01-09 ENCOUNTER — Other Ambulatory Visit (HOSPITAL_COMMUNITY): Payer: Self-pay | Admitting: Nurse Practitioner

## 2022-01-09 DIAGNOSIS — K7469 Other cirrhosis of liver: Secondary | ICD-10-CM

## 2022-01-10 ENCOUNTER — Telehealth: Payer: Self-pay

## 2022-01-10 NOTE — Telephone Encounter (Signed)
Pt called stating he is now ready to have his colonoscopy scheduled. He had other medical issues he had to deal with. Will he need an appt because he was last seen on 03/09/2020. Also he is requesting that this be done January 15th in the afternoon. Please advise

## 2022-01-11 NOTE — Telephone Encounter (Signed)
Phoned the pt and LMOVM for the pt to return call 

## 2022-01-11 NOTE — Telephone Encounter (Signed)
He needs EGD for variceal screening.  I am unsure of his colonoscopy status.  I think given that we have not seen him in over a year it would be prudent to have him come in for an office visit with an app or myself prior to scheduling.  Thank you

## 2022-01-12 NOTE — Telephone Encounter (Signed)
Pt returned call advised of the result note and pt was transferred to the front desk to be scheduled for an appt to be seen before EGD can be scheduled.

## 2022-01-12 NOTE — Telephone Encounter (Signed)
Phoned and  left a message for the pt to return call

## 2022-01-28 ENCOUNTER — Other Ambulatory Visit: Payer: Self-pay | Admitting: Physical Medicine and Rehabilitation
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
## 2022-02-26 ENCOUNTER — Encounter: Payer: Self-pay | Admitting: Gastroenterology

## 2022-02-26 ENCOUNTER — Ambulatory Visit (INDEPENDENT_AMBULATORY_CARE_PROVIDER_SITE_OTHER): Payer: Medicare Other | Admitting: Gastroenterology

## 2022-02-26 ENCOUNTER — Ambulatory Visit (HOSPITAL_COMMUNITY)
Admission: RE | Admit: 2022-02-26 | Discharge: 2022-02-26 | Disposition: A | Discharge status: Home / Self Care | Payer: Medicare Other | Source: Ambulatory Visit | Attending: Nurse Practitioner | Admitting: Nurse Practitioner

## 2022-02-26 VITALS — BP 135/81 | HR 85 | Temp 97.8°F | Ht 70.0 in | Wt 253.0 lb

## 2022-02-26 DIAGNOSIS — R188 Other ascites: Secondary | ICD-10-CM | POA: Diagnosis not present

## 2022-02-26 DIAGNOSIS — K7460 Unspecified cirrhosis of liver: Secondary | ICD-10-CM | POA: Diagnosis not present

## 2022-02-26 DIAGNOSIS — K7469 Other cirrhosis of liver: Secondary | ICD-10-CM | POA: Diagnosis present

## 2022-02-26 DIAGNOSIS — Z860100 Personal history of colon polyps, unspecified: Secondary | ICD-10-CM

## 2022-02-26 DIAGNOSIS — K766 Portal hypertension: Secondary | ICD-10-CM

## 2022-02-26 NOTE — Patient Instructions (Signed)
We will get you scheduled for an upper endoscopy in the near future with Dr. Abbey Chatters.  We will request clearance from cardiology to hold her Xarelto for 2 days prior.  I am requesting records from [facility] gastroenterology regarding timing of last colonoscopy.  We will save time along with your upper endoscopy for a possible colonoscopy at the same time if due.  If not due for colonoscopy we will proceed with EGD only.  Continue to follow with transplant hepatology, Roosevelt Locks, NP.  For your constipation I want to begin taking MiraLAX 1 capful daily as well as a fiber supplement with Benefiber, 2-3 teaspoons daily.  It was a pleasure to see you today. I want to create trusting relationships with patients. If you receive a survey regarding your visit,  I greatly appreciate you taking time to fill this out on paper or through your MyChart. I value your feedback.  Venetia Night, MSN, FNP-BC, AGACNP-BC [facility]

## 2022-02-28 ENCOUNTER — Other Ambulatory Visit: Payer: Self-pay | Admitting: Physical Medicine and Rehabilitation

## 2022-03-02 ENCOUNTER — Encounter: Payer: Medicare Other | Admitting: Physical Medicine and Rehabilitation

## 2022-03-14 ENCOUNTER — Other Ambulatory Visit: Payer: Self-pay | Admitting: Neurosurgery

## 2022-03-15 ENCOUNTER — Other Ambulatory Visit: Payer: Self-pay | Admitting: Neurosurgery

## 2022-03-15 ENCOUNTER — Telehealth: Payer: Self-pay | Admitting: *Deleted

## 2022-03-15 ENCOUNTER — Telehealth: Payer: Self-pay | Admitting: Gastroenterology

## 2022-03-15 HISTORY — PX: LUMBAR FUSION: SHX111

## 2022-03-15 NOTE — Telephone Encounter (Signed)
Received prior colonoscopy records from [facility] gastroenterology.  Patient underwent colonoscopy in November 2021.  Had single sessile nonbleeding rectal polyp 5-6 mm, removed by cold snare.  Random biopsies taken to rule out microscopic colitis.  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  Random colon biopsies with 1 specimen of scattered lymphocytes and plasma cells without inflamed crypts.  No high-grade dysplasia.   Also attached records are a prior CT A/P in June 2018 with hepatic steatosis versus hepatocellular disease.  Mild splenomegaly with varices, possible cirrhosis with portal venous hypertension.  Small amount of ascites within the abdominal pelvis.    Tammy R - No need for colonoscopy at this time.  Will only proceed with upper endoscopy for esophageal variceal screening.  Will defer to Dr. Abbey Chatters regarding timing for repeat colonoscopy.   Venetia Night, MSN, APRN, FNP-BC, AGACNP-BC [facility]

## 2022-03-15 NOTE — Telephone Encounter (Signed)
Faxed to Dr.Haas at [facility] in [place]

## 2022-03-15 NOTE — Telephone Encounter (Signed)
  Request for patient to stop medication prior to procedure or is needing cleareance  03/15/22  Darren Harrison Dec 01, 1955  What type of surgery is being performed? EGD w/possible colonoscopy.  When is surgery scheduled? TBD  What type of clearance is required (medical or pharmacy to hold medication or both? medication  Are there any medications that need to be held prior to surgery and how long? Xarelto x 2 days.   Name of physician performing surgery?  [facility] [employer]: [phone] Fax: [phone]  Anethesia type (none, local, MAC, general)? MAC

## 2022-03-15 NOTE — Telephone Encounter (Signed)
Spoke with Shirlean Mylar at [facility] cardiology [place] office, gave details of why pt needs to hold Xarelto for 2 days prior to his procedure. She took down the information. Informed her that I needed the providers signature. She states she will let provider know and fax over the appropriate information.

## 2022-03-16 LAB — LIPID PANEL
Chol/HDL Ratio: 3.3 ratio (ref 0.0–5.0)
Cholesterol, Total: 140 mg/dL (ref 100–199)
HDL: 42 mg/dL (ref >39)
LDL Chol Calc (NIH): 85 mg/dL (ref 0–99)
Triglycerides: 66 mg/dL (ref 0–149)
VLDL Cholesterol Cal: 13 mg/dL (ref 5–40)

## 2022-03-16 LAB — TSH: TSH: 0.687 u[IU]/mL (ref 0.450–4.500)

## 2022-03-16 LAB — T4, FREE: Free T4: 2.03 ng/dL — ABNORMAL HIGH (ref 0.82–1.77)

## 2022-03-16 NOTE — Telephone Encounter (Signed)
Still waiting to here back from provider for pt to hold his Xarelto.

## 2022-03-19 ENCOUNTER — Encounter: Payer: Self-pay | Admitting: *Deleted

## 2022-03-19 NOTE — Telephone Encounter (Signed)
Received clearance to hold Xarleto. Placed on providers desk

## 2022-03-19 NOTE — Telephone Encounter (Signed)
LMTRC

## 2022-03-19 NOTE — Telephone Encounter (Signed)
Pt has been scheduled for 04/05/22. Instructions sent via MyChart. He also says he is having a procedure on his back on 04/12/22 and he will be put under for that. He wants to know if him being up under on 04/05/22 will effect his procedure on 04/12/22. Please advise.Thank you

## 2022-03-20 ENCOUNTER — Encounter: Payer: Self-pay | Admitting: *Deleted

## 2022-03-21 ENCOUNTER — Encounter: Payer: Self-pay | Admitting: "Endocrinology

## 2022-03-21 ENCOUNTER — Ambulatory Visit (INDEPENDENT_AMBULATORY_CARE_PROVIDER_SITE_OTHER): Payer: Medicare Other | Admitting: "Endocrinology

## 2022-03-21 VITALS — BP 138/88 | HR 56 | Ht 70.0 in | Wt 253.8 lb

## 2022-03-21 DIAGNOSIS — E890 Postprocedural hypothyroidism: Secondary | ICD-10-CM | POA: Diagnosis not present

## 2022-03-21 MED ORDER — LEVOTHYROXINE SODIUM 175 MCG PO TABS: 175.0000 ug | ORAL_TABLET | Freq: Every day | ORAL | 1 refills | Status: DC

## 2022-03-21 NOTE — Telephone Encounter (Signed)
Pt informed of providers message and instructions. Verbalized understanding
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
## 2022-04-03 NOTE — Patient Instructions (Addendum)
WILLIES OZAKI  04/03/2022     @PREFPERIOPPHARMACY$ @   Your procedure is scheduled on  04/05/2022.   Report to [facility] at  [place]  A.M.   Call this number if you have problems the morning of surgery:  [phone]  If you experience any cold or flu symptoms such as cough, fever, chills, shortness of breath, etc. between now and your scheduled surgery, please notify us at the above number.   Remember:  Follow the diet instructions given to you by the office.      Your last dose of xarelto should have been on 04/02/2022.      Take these medicines the morning of surgery with A SIP OF WATER            baclofen, flexeril, levothyroxine, metoprolol.    Do not wear jewelry, make-up or nail polish.  Do not wear lotions, powders, or perfumes, or deodorant.  Do not shave 48 hours prior to surgery.  Men may shave face and neck.  Do not bring valuables to the hospital.  [facility] is not responsible for any belongings or valuables.  Contacts, dentures or bridgework may not be worn into surgery.  Leave your suitcase in the car.  After surgery it may be brought to your room.  For patients admitted to the hospital, discharge time will be determined by your treatment team.  Patients discharged the day of surgery will not be allowed to drive home and must have someone with them for 24 hours.    Special instructions:   DO NOT smoke tobacco or vape for 24 hours before your procedure.  Please read over the following fact sheets that you were given. Anesthesia Post-op Instructions and Care and Recovery After Surgery      Upper Endoscopy, Adult, Care After After the procedure, it is common to have a sore throat. It is also common to have: Mild stomach pain or discomfort. Bloating. Nausea. Follow these instructions at home: The instructions below may help you care for yourself at home. Your health care provider may give you more instructions. If you have questions, ask your  health care provider. If you were given a sedative during the procedure, it can affect you for several hours. Do not drive or operate machinery until your health care provider says that it is safe. If you will be going home right after the procedure, plan to have a responsible adult: Take you home from the hospital or clinic. You will not be allowed to drive. Care for you for the time you are told. Follow instructions from your health care provider about what you may eat and drink. Return to your normal activities as told by your health care provider. Ask your health care provider what activities are safe for you. Take over-the-counter and prescription medicines only as told by your health care provider. Contact a health care provider if you: Have a sore throat that lasts longer than one day. Have trouble swallowing. Have a fever. Get help right away if you: Vomit blood or your vomit looks like coffee grounds. Have bloody, black, or tarry stools. Have a very bad sore throat or you cannot swallow. Have difficulty breathing or very bad pain in your chest or abdomen. These symptoms may be an emergency. Get help right away. Call 911. Do not wait to see if the symptoms will go away. Do not drive yourself to the hospital. Summary After the procedure, it is common  to have a sore throat, mild stomach discomfort, bloating, and nausea. If you were given a sedative during the procedure, it can affect you for several hours. Do not drive until your health care provider says that it is safe. Follow instructions from your health care provider about what you may eat and drink. Return to your normal activities as told by your health care provider. This information is not intended to replace advice given to you by your health care provider. Make sure you discuss any questions you have with your health care provider. Document Revised: 05/10/2021 Document Reviewed: 05/10/2021 Elsevier Patient Education  [place] After The following information offers guidance on how to care for yourself after your procedure. Your health care provider may also give you more specific instructions. If you have problems or questions, contact your health care provider. What can I expect after the procedure? After the procedure, it is common to have: Tiredness. Little or no memory about what happened during or after the procedure. Impaired judgment when it comes to making decisions. Nausea or vomiting. Some trouble with balance. Follow these instructions at home: For the time period you were told by your health care provider:  Rest. Do not participate in activities where you could fall or become injured. Do not drive or use machinery. Do not drink alcohol. Do not take sleeping pills or medicines that cause drowsiness. Do not make important decisions or sign legal documents. Do not take care of children on your own. Medicines Take over-the-counter and prescription medicines only as told by your health care provider. If you were prescribed antibiotics, take them as told by your health care provider. Do not stop using the antibiotic even if you start to feel better. Eating and drinking Follow instructions from your health care provider about what you may eat and drink. Drink enough fluid to keep your urine pale yellow. If you vomit: Drink clear fluids slowly and in small amounts as you are able. Clear fluids include water, ice chips, low-calorie sports drinks, and fruit juice that has water added to it (diluted fruit juice). Eat light and bland foods in small amounts as you are able. These foods include bananas, applesauce, rice, lean meats, toast, and crackers. General instructions  Have a responsible adult stay with you for the time you are told. It is important to have someone help care for you until you are awake and alert. If you have sleep apnea, surgery and some  medicines can increase your risk for breathing problems. Follow instructions from your health care provider about wearing your sleep device: When you are sleeping. This includes during daytime naps. While taking prescription pain medicines, sleeping medicines, or medicines that make you drowsy. Do not use any products that contain nicotine or tobacco. These products include cigarettes, chewing tobacco, and vaping devices, such as e-cigarettes. If you need help quitting, ask your health care provider. Contact a health care provider if: You feel nauseous or vomit every time you eat or drink. You feel light-headed. You are still sleepy or having trouble with balance after 24 hours. You get a rash. You have a fever. You have redness or swelling around the IV site. Get help right away if: You have trouble breathing. You have new confusion after you get home. These symptoms may be an emergency. Get help right away. Call 911. Do not wait to see if the symptoms will go away. Do not drive yourself to the hospital. This  information is not intended to replace advice given to you by your health care provider. Make sure you discuss any questions you have with your health care provider. Document Revised: 06/26/2021 Document Reviewed: 06/26/2021 Elsevier Patient Education  [place].

## 2022-04-04 ENCOUNTER — Encounter (HOSPITAL_COMMUNITY)
Admission: RE | Admit: 2022-04-04 | Discharge: 2022-04-04 | Disposition: A | Discharge status: Home / Self Care | Payer: Medicare Other | Source: Ambulatory Visit | Attending: General Surgery | Admitting: General Surgery

## 2022-04-04 ENCOUNTER — Encounter (HOSPITAL_COMMUNITY): Payer: Self-pay

## 2022-04-04 VITALS — BP 150/91 | HR 62 | Temp 97.8°F | Resp 18 | Wt 253.8 lb

## 2022-04-04 DIAGNOSIS — K7460 Unspecified cirrhosis of liver: Secondary | ICD-10-CM | POA: Insufficient documentation

## 2022-04-04 DIAGNOSIS — Z01818 Encounter for other preprocedural examination: Secondary | ICD-10-CM | POA: Diagnosis present

## 2022-04-04 DIAGNOSIS — R188 Other ascites: Secondary | ICD-10-CM | POA: Insufficient documentation

## 2022-04-04 HISTORY — DX: Unspecified atrial fibrillation: I48.91

## 2022-04-04 LAB — PROTIME-INR
INR: 1.2 (ref 0.8–1.2)
Prothrombin Time: 15.5 seconds — ABNORMAL HIGH (ref 11.4–15.2)

## 2022-04-04 LAB — COMPREHENSIVE METABOLIC PANEL
ALT: 29 U/L (ref 0–44)
AST: 29 U/L (ref 15–41)
Albumin: 4.1 g/dL (ref 3.5–5.0)
Alkaline Phosphatase: 118 U/L (ref 38–126)
Anion gap: 9 (ref 5–15)
BUN: 18 mg/dL (ref 8–23)
CO2: 27 mmol/L (ref 22–32)
Calcium: 9 mg/dL (ref 8.9–10.3)
Chloride: 99 mmol/L (ref 98–111)
Creatinine, Ser: 0.82 mg/dL (ref 0.61–1.24)
GFR, Estimated: >60 mL/min (ref >60)
Glucose, Bld: 89 mg/dL (ref 70–99)
Potassium: 4.2 mmol/L (ref 3.5–5.1)
Sodium: 135 mmol/L (ref 135–145)
Total Bilirubin: 0.9 mg/dL (ref 0.3–1.2)
Total Protein: 7.2 g/dL (ref 6.5–8.1)

## 2022-04-04 LAB — CBC WITH DIFFERENTIAL/PLATELET
Abs Immature Granulocytes: 0.01 10*3/uL (ref 0.00–0.07)
Basophils Absolute: 0 10*3/uL (ref 0.0–0.1)
Basophils Relative: 1 %
Eosinophils Absolute: 0.1 10*3/uL (ref 0.0–0.5)
Eosinophils Relative: 3 %
HCT: 41.5 % (ref 39.0–52.0)
Hemoglobin: 13.7 g/dL (ref 13.0–17.0)
Immature Granulocytes: 0 %
Lymphocytes Relative: 33 %
Lymphs Abs: 1.7 10*3/uL (ref 0.7–4.0)
MCH: 29.2 pg (ref 26.0–34.0)
MCHC: 33 g/dL (ref 30.0–36.0)
MCV: 88.5 fL (ref 80.0–100.0)
Monocytes Absolute: 0.4 10*3/uL (ref 0.1–1.0)
Monocytes Relative: 7 %
Neutro Abs: 2.9 10*3/uL (ref 1.7–7.7)
Neutrophils Relative %: 56 %
Platelets: 166 10*3/uL (ref 150–400)
RBC: 4.69 MIL/uL (ref 4.22–5.81)
RDW: 14.7 % (ref 11.5–15.5)
WBC: 5.1 10*3/uL (ref 4.0–10.5)
nRBC: 0 % (ref 0.0–0.2)

## 2022-04-05 ENCOUNTER — Encounter (HOSPITAL_COMMUNITY): Payer: Self-pay

## 2022-04-05 ENCOUNTER — Ambulatory Visit (HOSPITAL_BASED_OUTPATIENT_CLINIC_OR_DEPARTMENT_OTHER): Payer: Medicare Other | Admitting: Anesthesiology

## 2022-04-05 ENCOUNTER — Ambulatory Visit (HOSPITAL_COMMUNITY)
Admission: RE | Admit: 2022-04-05 | Discharge: 2022-04-05 | Disposition: A | Discharge status: Home / Self Care | Payer: Medicare Other | Attending: Internal Medicine | Admitting: Internal Medicine

## 2022-04-05 ENCOUNTER — Ambulatory Visit (HOSPITAL_COMMUNITY): Payer: Medicare Other | Admitting: Anesthesiology

## 2022-04-05 ENCOUNTER — Encounter (HOSPITAL_COMMUNITY)
Admission: RE | Disposition: A | Discharge status: Home / Self Care | Payer: Self-pay | Source: Home / Self Care | Attending: Internal Medicine

## 2022-04-05 ENCOUNTER — Other Ambulatory Visit: Payer: Self-pay | Admitting: Physical Medicine and Rehabilitation

## 2022-04-05 ENCOUNTER — Other Ambulatory Visit: Payer: Self-pay

## 2022-04-05 DIAGNOSIS — I10 Essential (primary) hypertension: Secondary | ICD-10-CM | POA: Insufficient documentation

## 2022-04-05 DIAGNOSIS — K2289 Other specified disease of esophagus: Secondary | ICD-10-CM | POA: Diagnosis not present

## 2022-04-05 DIAGNOSIS — K449 Diaphragmatic hernia without obstruction or gangrene: Secondary | ICD-10-CM | POA: Insufficient documentation

## 2022-04-05 DIAGNOSIS — K259 Gastric ulcer, unspecified as acute or chronic, without hemorrhage or perforation: Secondary | ICD-10-CM

## 2022-04-05 DIAGNOSIS — Z87891 Personal history of nicotine dependence: Secondary | ICD-10-CM | POA: Insufficient documentation

## 2022-04-05 DIAGNOSIS — K2100 Gastro-esophageal reflux disease with esophagitis, without bleeding: Secondary | ICD-10-CM | POA: Diagnosis not present

## 2022-04-05 DIAGNOSIS — E039 Hypothyroidism, unspecified: Secondary | ICD-10-CM | POA: Insufficient documentation

## 2022-04-05 DIAGNOSIS — K7460 Unspecified cirrhosis of liver: Secondary | ICD-10-CM | POA: Diagnosis not present

## 2022-04-05 DIAGNOSIS — I4891 Unspecified atrial fibrillation: Secondary | ICD-10-CM | POA: Insufficient documentation

## 2022-04-05 DIAGNOSIS — Z13810 Encounter for screening for upper gastrointestinal disorder: Secondary | ICD-10-CM | POA: Insufficient documentation

## 2022-04-05 HISTORY — PX: ESOPHAGOGASTRODUODENOSCOPY (EGD) WITH PROPOFOL: SHX5813

## 2022-04-05 HISTORY — PX: BIOPSY: SHX5522

## 2022-04-05 SURGERY — ESOPHAGOGASTRODUODENOSCOPY (EGD) WITH PROPOFOL
Anesthesia: General

## 2022-04-05 MED ORDER — PROPOFOL 10 MG/ML IV BOLUS
INTRAVENOUS | Status: DC | PRN
  Administered 2022-04-05: 120 mg via INTRAVENOUS
  Administered 2022-04-05: 20 mg via INTRAVENOUS

## 2022-04-05 MED ORDER — PANTOPRAZOLE SODIUM 40 MG PO TBEC: 40.0000 mg | DELAYED_RELEASE_TABLET | Freq: Two times a day (BID) | ORAL | 11 refills | Status: DC

## 2022-04-05 MED ORDER — LACTATED RINGERS IV SOLN: INTRAVENOUS | Status: DC

## 2022-04-05 NOTE — Transfer of Care (Signed)
Immediate Anesthesia Transfer of Care Note  Patient: Darren Harrison  Procedure(s) Performed: ESOPHAGOGASTRODUODENOSCOPY (EGD) WITH PROPOFOL BIOPSY  Patient Location: Short Stay  Anesthesia Type:General  Level of Consciousness: awake and patient cooperative  Airway & Oxygen Therapy: Patient Spontanous Breathing  Post-op Assessment: Report given to RN and Post -op Vital signs reviewed and stable  Post vital signs: Reviewed and stable  Last Vitals:  Vitals Value Taken Time  BP 102/63 04/05/22 1011  Temp 36.4 C 04/05/22 1011  Pulse 58 04/05/22 1011  Resp 13 04/05/22 1011  SpO2 95 % 04/05/22 1011    Last Pain:  Vitals:   04/05/22 1011  TempSrc: Oral  PainSc: 0-No pain      Patients Stated Pain Goal: 5 ([id number])  Complications: No notable events documented.

## 2022-04-05 NOTE — Anesthesia Preprocedure Evaluation (Signed)
Anesthesia Evaluation  Patient identified by MRN, date of birth, ID band Patient awake    Reviewed: Allergy & Precautions, H&P , NPO status , Patient's Chart, lab work & pertinent test results, reviewed documented beta blocker date and time   Airway Mallampati: II  TM Distance: >3 FB Neck ROM: Full    Dental  (+) Dental Advisory Given, Missing   Pulmonary neg pulmonary ROS   Pulmonary exam normal breath sounds clear to auscultation       Cardiovascular Exercise Tolerance: Good hypertension, Pt. on medications and Pt. on home beta blockers Normal cardiovascular exam+ dysrhythmias Atrial Fibrillation  Rhythm:Regular Rate:Normal  13-Jun-2021 10:31:39 [facility]-PRE ROUTINE RECORD Apr 05, 1955 (69 yr) Male Caucasian Vent. rate 61 BPM PR interval 198 ms QRS duration 108 ms QT/QTcB 444/446 ms P-R-T axes 5 -14 25 Normal sinus rhythm Normal ECG When compared with ECG of 13-Oct-2015 08:45, PREVIOUS ECG IS PRESENT No significant change since Confirmed by Jenkins Rouge [phone]) on 06/13/2021 12:12:57 PM   Neuro/Psych negative neurological ROS  negative psych ROS   GI/Hepatic ,GERD (mild)  ,,(+) Cirrhosis         Endo/Other  Hypothyroidism    Renal/GU negative Renal ROS  negative genitourinary   Musculoskeletal  (+) Arthritis , Osteoarthritis,    Abdominal   Peds negative pediatric ROS (+)  Hematology negative hematology ROS (+)   Anesthesia Other Findings   Reproductive/Obstetrics negative OB ROS                             Anesthesia Physical Anesthesia Plan  ASA: 3  Anesthesia Plan: General   Post-op Pain Management: Minimal or no pain anticipated   Induction: Intravenous  PONV Risk Score and Plan: 0 and Propofol infusion  Airway Management Planned: Nasal Cannula and Natural Airway  Additional Equipment:   Intra-op Plan:   Post-operative Plan:   Informed  Consent: I have reviewed the patients History and Physical, chart, labs and discussed the procedure including the risks, benefits and alternatives for the proposed anesthesia with the patient or authorized representative who has indicated his/her understanding and acceptance.     Dental advisory given  Plan Discussed with: CRNA and Surgeon  Anesthesia Plan Comments:         Anesthesia Quick Evaluation

## 2022-04-05 NOTE — Anesthesia Postprocedure Evaluation (Signed)
Anesthesia Post Note  Patient: Darren Harrison  Procedure(s) Performed: ESOPHAGOGASTRODUODENOSCOPY (EGD) WITH PROPOFOL BIOPSY  Patient location during evaluation: Phase II Anesthesia Type: General Level of consciousness: awake and alert and oriented Pain management: pain level controlled Vital Signs Assessment: post-procedure vital signs reviewed and stable Respiratory status: spontaneous breathing, nonlabored ventilation and respiratory function stable Cardiovascular status: blood pressure returned to baseline and stable Postop Assessment: no apparent nausea or vomiting Anesthetic complications: no  No notable events documented.   Last Vitals:  Vitals:   04/05/22 0831 04/05/22 1011  BP: (!) 141/96 102/63  Pulse: 63 (!) 58  Resp: 16 13  Temp: 36.6 C 36.4 C  SpO2: 97% 95%    Last Pain:  Vitals:   04/05/22 1011  TempSrc: Oral  PainSc: 0-No pain                 Kista Robb C Starlit Raburn

## 2022-04-05 NOTE — Discharge Instructions (Addendum)
EGD Discharge instructions Please read the instructions outlined below and refer to this sheet in the next few weeks. These discharge instructions provide you with general information on caring for yourself after you leave the hospital. Your doctor may also give you specific instructions. While your treatment has been planned according to the most current medical practices available, unavoidable complications occasionally occur. If you have any problems or questions after discharge, please call your doctor. ACTIVITY You may resume your regular activity but move at a slower pace for the next 24 hours.  Take frequent rest periods for the next 24 hours.  Walking will help expel (get rid of) the air and reduce the bloated feeling in your abdomen.  No driving for 24 hours (because of the anesthesia (medicine) used during the test).  You may shower.  Do not sign any important legal documents or operate any machinery for 24 hours (because of the anesthesia used during the test).  NUTRITION Drink plenty of fluids.  You may resume your normal diet.  Begin with a light meal and progress to your normal diet.  Avoid alcoholic beverages for 24 hours or as instructed by your caregiver.  MEDICATIONS You may resume your normal medications unless your caregiver tells you otherwise.  WHAT YOU CAN EXPECT TODAY You may experience abdominal discomfort such as a feeling of fullness or "gas" pains.  FOLLOW-UP Your doctor will discuss the results of your test with you.  SEEK IMMEDIATE MEDICAL ATTENTION IF ANY OF THE FOLLOWING OCCUR: Excessive nausea (feeling sick to your stomach) and/or vomiting.  Severe abdominal pain and distention (swelling).  Trouble swallowing.  Temperature over 101 F (37.8 C).  Rectal bleeding or vomiting of blood.   Your upper endoscopy revealed a small hiatal hernia.  I did not see any evidence of varices throughout your entire esophagus or stomach.  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  Also have evidence of reflux esophagitis.  In your stomach you have 2 small ulcers which I also sampled to rule out infection with a bacteria called H. pylori.  Small bowel appeared normal.  Await pathology results, my office will contact you next week.  I am going to start you on a new medication called pantoprazole 40 mg.  I want you to take this twice daily for 12 weeks at which point you can decrease down to once daily.  Limit NSAID use.  Okay to restart Xarelto tomorrow.  Follow-up in GI clinic in 3 months. OFFICE TO CALL WITH APPOINTMENT   I hope you have a great rest of your week!  Elon Alas. Abbey Chatters, D.O. Gastroenterology and Hepatology [facility]
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
## 2022-04-05 NOTE — Op Note (Signed)
[facility] Patient Name: Darren Harrison Procedure Date: 04/05/2022 9:47 AM MRN: [id number] Date of Birth: March 14, 1955 Attending MD: Elon Alas. Abbey Chatters , [place], [id number] CSN: [id number] Age: 67 Admit Type: Outpatient Procedure:                Upper GI endoscopy Indications:              Screening procedure, Cirrhosis rule out esophageal                            varices Providers:                Elon Alas. Abbey Chatters, DO, Janeece Riggers, RN, Illene Labrador Referring MD:              Medicines:                See the Anesthesia note for documentation of the                            administered medications Complications:            No immediate complications. Estimated Blood Loss:     Estimated blood loss was minimal. Procedure:                Pre-Anesthesia Assessment:                           - The anesthesia plan was to use monitored                            anesthesia care (MAC).                           After obtaining informed consent, the endoscope was                            passed under direct vision. Throughout the                            procedure, the patient's blood pressure, pulse, and                            oxygen saturations were monitored continuously. The                            [id number]) scope was introduced through the                            mouth, and advanced to the second part of duodenum.                            The upper GI endoscopy was accomplished without                            difficulty. The patient tolerated the procedure  well. Scope In: 10:01:46 AM Scope Out: 10:09:42 AM Total Procedure Duration: 0 hours 7 minutes 56 seconds  Findings:      A 2 cm hiatal hernia was present.      There were esophageal mucosal changes consistent with short-segment       Barrett's esophagus present at the gastroesophageal junction. The       maximum longitudinal extent of these  mucosal changes was 2 cm in length.       Mucosa was biopsied with a cold forceps for histology. One specimen       bottle was sent to pathology.      LA Grade A (one or more mucosal breaks less than 5 mm, not extending       between tops of 2 mucosal folds) esophagitis with no bleeding was found       at the gastroesophageal junction.      There is no endoscopic evidence of varices in the entire esophagus.      Two non-bleeding cratered gastric ulcers were found in the gastric body.       The largest lesion was 5 mm in largest dimension. Biopsies were taken       with a cold forceps for Helicobacter pylori testing.      There is no endoscopic evidence of varices in the entire examined       stomach.      The duodenal bulb, first portion of the duodenum and second portion of       the duodenum were normal. Impression:               - 2 cm hiatal hernia.                           - Esophageal mucosal changes consistent with                            short-segment Barrett's esophagus. Biopsied.                           - LA Grade A reflux esophagitis with no bleeding.                           - Non-bleeding gastric ulcers. Biopsied.                           - Normal duodenal bulb, first portion of the                            duodenum and second portion of the duodenum. Moderate Sedation:      Per Anesthesia Care Recommendation:           - Patient has a contact number available for                            emergencies. The signs and symptoms of potential                            delayed complications were discussed with the  patient. Return to normal activities tomorrow.                            Written discharge instructions were provided to the                            patient.                           - Resume previous diet.                           - Continue present medications.                           - Await pathology results.                            - Repeat upper endoscopy in 2 years for screening                            purposes.                           - Return to GI clinic in 3 months.                           - Use Protonix (pantoprazole) 40 mg PO BID for 12                            weeks then decrease to once daily. Procedure Code(s):        --- Professional ---                           [phone], Esophagogastroduodenoscopy, flexible,                            transoral; with biopsy, single or multiple Diagnosis Code(s):        --- Professional ---                           K44.9, Diaphragmatic hernia without obstruction or                            gangrene                           K22.89, Other specified disease of esophagus                           K21.00, Gastro-esophageal reflux disease with                            esophagitis, without bleeding                           K25.9, Gastric ulcer, unspecified as acute or  chronic, without hemorrhage or perforation                           Z13.810, Encounter for screening for upper                            gastrointestinal disorder                           K74.60, Unspecified cirrhosis of liver CPT copyright 2022 American Medical Association. All rights reserved. The codes documented in this report are preliminary and upon coder review may  be revised to meet current compliance requirements. Elon Alas. Abbey Chatters, DO [place] Abbey Chatters, DO 04/05/2022 10:11:46 AM This report has been signed electronically. Number of Addenda: 0

## 2022-04-05 NOTE — Anesthesia Procedure Notes (Signed)
Date/Time: 04/05/2022 10:00 AM  Performed by: Vista Deck, CRNAPre-anesthesia Checklist: Patient identified, Emergency Drugs available, Suction available, Timeout performed and Patient being monitored Patient Re-evaluated:Patient Re-evaluated prior to induction Oxygen Delivery Method: Non-rebreather mask

## 2022-04-05 NOTE — Anesthesia Procedure Notes (Signed)
Date/Time: 04/05/2022 9:56 AM  Performed by: Vista Deck, CRNAPre-anesthesia Checklist: Patient identified, Emergency Drugs available, Suction available, Timeout performed and Patient being monitored Patient Re-evaluated:Patient Re-evaluated prior to induction Oxygen Delivery Method: Nasal Cannula

## 2022-04-06 LAB — SURGICAL PATHOLOGY

## 2022-04-09 ENCOUNTER — Other Ambulatory Visit (HOSPITAL_COMMUNITY): Payer: Self-pay

## 2022-04-10 ENCOUNTER — Encounter (HOSPITAL_COMMUNITY): Payer: Self-pay

## 2022-04-10 ENCOUNTER — Encounter (HOSPITAL_COMMUNITY)
Admission: RE | Admit: 2022-04-10 | Discharge: 2022-04-10 | Disposition: A | Discharge status: Home / Self Care | Payer: Medicare Other | Source: Ambulatory Visit | Attending: Neurosurgery | Admitting: Neurosurgery

## 2022-04-10 ENCOUNTER — Other Ambulatory Visit: Payer: Self-pay

## 2022-04-10 VITALS — BP 150/95 | HR 77 | Temp 97.8°F | Resp 16 | Ht 70.0 in | Wt 256.8 lb

## 2022-04-10 DIAGNOSIS — Z01812 Encounter for preprocedural laboratory examination: Secondary | ICD-10-CM | POA: Insufficient documentation

## 2022-04-10 DIAGNOSIS — M48061 Spinal stenosis, lumbar region without neurogenic claudication: Secondary | ICD-10-CM | POA: Insufficient documentation

## 2022-04-10 DIAGNOSIS — I700 Atherosclerosis of aorta: Secondary | ICD-10-CM | POA: Diagnosis not present

## 2022-04-10 DIAGNOSIS — K259 Gastric ulcer, unspecified as acute or chronic, without hemorrhage or perforation: Secondary | ICD-10-CM | POA: Insufficient documentation

## 2022-04-10 DIAGNOSIS — E039 Hypothyroidism, unspecified: Secondary | ICD-10-CM | POA: Diagnosis not present

## 2022-04-10 DIAGNOSIS — Z96653 Presence of artificial knee joint, bilateral: Secondary | ICD-10-CM | POA: Diagnosis not present

## 2022-04-10 DIAGNOSIS — I493 Ventricular premature depolarization: Secondary | ICD-10-CM | POA: Insufficient documentation

## 2022-04-10 DIAGNOSIS — K7460 Unspecified cirrhosis of liver: Secondary | ICD-10-CM | POA: Insufficient documentation

## 2022-04-10 DIAGNOSIS — K2100 Gastro-esophageal reflux disease with esophagitis, without bleeding: Secondary | ICD-10-CM | POA: Diagnosis not present

## 2022-04-10 DIAGNOSIS — I10 Essential (primary) hypertension: Secondary | ICD-10-CM | POA: Insufficient documentation

## 2022-04-10 DIAGNOSIS — M4316 Spondylolisthesis, lumbar region: Secondary | ICD-10-CM | POA: Insufficient documentation

## 2022-04-10 DIAGNOSIS — K449 Diaphragmatic hernia without obstruction or gangrene: Secondary | ICD-10-CM | POA: Diagnosis not present

## 2022-04-10 DIAGNOSIS — Z01818 Encounter for other preprocedural examination: Secondary | ICD-10-CM

## 2022-04-10 DIAGNOSIS — I4891 Unspecified atrial fibrillation: Secondary | ICD-10-CM | POA: Diagnosis not present

## 2022-04-10 DIAGNOSIS — M47816 Spondylosis without myelopathy or radiculopathy, lumbar region: Secondary | ICD-10-CM | POA: Insufficient documentation

## 2022-04-10 HISTORY — DX: Personal history of other diseases of the digestive system: Z87.19

## 2022-04-10 HISTORY — DX: Inflammatory liver disease, unspecified: K75.9

## 2022-04-10 HISTORY — DX: Unspecified cirrhosis of liver: K74.60

## 2022-04-10 HISTORY — DX: Myoneural disorder, unspecified: G70.9

## 2022-04-10 HISTORY — DX: Thyrotoxicosis with diffuse goiter without thyrotoxic crisis or storm: E05.00

## 2022-04-10 LAB — TYPE AND SCREEN
ABO/RH(D): O POS
Antibody Screen: NEGATIVE

## 2022-04-10 LAB — SURGICAL PCR SCREEN
MRSA, PCR: NEGATIVE
Staphylococcus aureus: NEGATIVE

## 2022-04-10 NOTE — Progress Notes (Signed)
PCP - Linward Foster, FNP Cardiologist - Dr. Clarisse Gouge (He started seeing this MD related to a.fib, but testing revealed the problems were with his thyroid and not his heart)  PPM/ICD - Denies Device Orders - n/a Rep Notified - n/a  Chest x-ray - n/a EKG - 06/13/2021 Stress Test - 2022- Records requested from [facility] - 2022 - Records requested From [facility] - Denies  Sleep Study - Denies CPAP - n/a  No DM  Last dose of GLP1 agonist- n/a GLP1 instructions: n/a  Blood Thinner Instructions: Pts last dose of Xarelto was 2/19. He stopped it for his EGD 04/05/22 and has not restarted it. Since his a.fib resolved once his thyroid problems were resolved, he is trying to get his MD to take him off it. Aspirin Instructions: n/a  NPO after midnight  COVID TEST- n/a   Anesthesia review: Yes. Stress test and Echo requested from [facility]   Patient denies shortness of breath, fever, cough and chest pain at PAT appointment. Pt denies any respiratory illnesses within the past 2 months.   All instructions explained to the patient, with a verbal understanding of the material. Patient agrees to go over the instructions while at home for a better understanding. Patient also instructed to self quarantine after being tested for COVID-19. The opportunity to ask questions was provided.

## 2022-04-10 NOTE — Pre-Procedure Instructions (Signed)
Surgical Instructions    Your procedure is scheduled on April 12, 2022.  Report to [facility] Main Entrance "A" at 9:30 A.M., then check in with the Admitting office.  Call this number if you have problems the morning of surgery:  [phone]  If you have any questions prior to your surgery date call [phone]: Open Monday-Friday 8am-4pm If you experience any cold or flu symptoms such as cough, fever, chills, shortness of breath, etc. between now and your scheduled surgery, please notify us at the above number.     Remember:  Do not eat or drink after midnight the night before your surgery      Take these medicines the morning of surgery with A SIP OF WATER:  Baclofen   levothyroxine (SYNTHROID)   metoprolol tartrate (LOPRESSOR)   pantoprazole (PROTONIX)    May take these medicines IF NEEDED:  acetaminophen (TYLENOL)     Follow your surgeon's instructions on when to stop XARELTO.  If no instructions were given by your surgeon then you will need to call the office to get those instructions.     As of today, STOP taking any Aspirin (unless otherwise instructed by your surgeon) Aleve, Naproxen, Ibuprofen, Motrin, Advil, Goody's, BC's, all herbal medications, fish oil, and all vitamins. This includes your medication: diclofenac Sodium (VOLTAREN) GEL                      Do NOT Smoke (Tobacco/Vaping) for 24 hours prior to your procedure.  If you use a CPAP at night, you may bring your mask/headgear for your overnight stay.   Contacts, glasses, piercing's, hearing aid's, dentures or partials may not be worn into surgery, please bring cases for these belongings.    For patients admitted to the hospital, discharge time will be determined by your treatment team.   Patients discharged the day of surgery will not be allowed to drive home, and someone needs to stay with them for 24 hours.  SURGICAL WAITING ROOM VISITATION Patients having surgery or a procedure may have no more  than 2 support people in the waiting area - these visitors may rotate.   Children under the age of 4 must have an adult with them who is not the patient. If the patient needs to stay at the hospital during part of their recovery, the visitor guidelines for inpatient rooms apply. Pre-op nurse will coordinate an appropriate time for 1 support person to accompany patient in pre-op.  This support person may not rotate.   Please refer to the [facility] website for the visitor guidelines for Inpatients (after your surgery is over and you are in a regular room).    Special instructions:   [facility]- Preparing For Surgery  Before surgery, you can play an important role. Because skin is not sterile, your skin needs to be as free of germs as possible. You can reduce the number of germs on your skin by washing with CHG (chlorahexidine gluconate) Soap before surgery.  CHG is an antiseptic cleaner which kills germs and bonds with the skin to continue killing germs even after washing.    Oral Hygiene is also important to reduce your risk of infection.  Remember - BRUSH YOUR TEETH THE MORNING OF SURGERY WITH YOUR REGULAR TOOTHPASTE  Please do not use if you have an allergy to CHG or antibacterial soaps. If your skin becomes reddened/irritated stop using the CHG.  Do not shave (including legs and underarms) for at least 48  hours prior to first CHG shower. It is OK to shave your face.  Please follow these instructions carefully.   Shower the NIGHT BEFORE SURGERY and the MORNING OF SURGERY  If you chose to wash your hair, wash your hair first as usual with your normal shampoo.  After you shampoo, rinse your hair and body thoroughly to remove the shampoo.  Use CHG Soap as you would any other liquid soap. You can apply CHG directly to the skin and wash gently with a scrungie or a clean washcloth.   Apply the CHG Soap to your body ONLY FROM THE NECK DOWN.  Do not use on open wounds or open sores. Avoid  contact with your eyes, ears, mouth and genitals (private parts). Wash Face and genitals (private parts)  with your normal soap.   Wash thoroughly, paying special attention to the area where your surgery will be performed.  Thoroughly rinse your body with warm water from the neck down.  DO NOT shower/wash with your normal soap after using and rinsing off the CHG Soap.  Pat yourself dry with a CLEAN TOWEL.  Wear CLEAN PAJAMAS to bed the night before surgery  Place CLEAN SHEETS on your bed the night before your surgery  DO NOT SLEEP WITH PETS.   Day of Surgery: Take a shower with CHG soap. Do not wear jewelry or makeup Do not wear lotions, powders, perfumes/colognes, or deodorant. Do not shave 48 hours prior to surgery.  Men may shave face and neck. Do not bring valuables to the hospital.  [facility] is not responsible for any belongings or valuables. Do not wear nail polish, gel polish, artificial nails, or any other type of covering on natural nails (fingers and toes) If you have artificial nails or gel coating that need to be removed by a nail salon, please have this removed prior to surgery. Artificial nails or gel coating may interfere with anesthesia's ability to adequately monitor your vital signs.  Wear Clean/Comfortable clothing the morning of surgery Remember to brush your teeth WITH YOUR REGULAR TOOTHPASTE.   Please read over the following fact sheets that you were given.    If you received a COVID test during your pre-op visit  it is requested that you wear a mask when out in public, stay away from anyone that may not be feeling well and notify your surgeon if you develop symptoms. If you have been in contact with anyone that has tested positive in the last 10 days please notify you surgeon.

## 2022-04-11 ENCOUNTER — Encounter (HOSPITAL_COMMUNITY): Payer: Self-pay

## 2022-04-11 NOTE — Anesthesia Preprocedure Evaluation (Signed)
Anesthesia Evaluation  Patient identified by MRN, date of birth, ID band Patient awake    Reviewed: Allergy & Precautions, NPO status , Patient's Chart, lab work & pertinent test results  Airway Mallampati: I  TM Distance: >3 FB Neck ROM: Full    Dental  (+) Poor Dentition, Missing, Dental Advisory Given   Pulmonary    breath sounds clear to auscultation       Cardiovascular hypertension, Pt. on home beta blockers + dysrhythmias Atrial Fibrillation  Rhythm:Regular Rate:Normal     Neuro/Psych  negative psych ROS   GI/Hepatic Neg liver ROS, hiatal hernia,GERD  Medicated and Controlled,,  Endo/Other  Hypothyroidism Hyperthyroidism   Renal/GU negative Renal ROS     Musculoskeletal  (+) Arthritis ,    Abdominal   Peds  Hematology negative hematology ROS (+)   Anesthesia Other Findings   Reproductive/Obstetrics                             Anesthesia Physical Anesthesia Plan  ASA: 3  Anesthesia Plan: General   Post-op Pain Management: Tylenol PO (pre-op)*   Induction: Intravenous  PONV Risk Score and Plan: 3 and Ondansetron, Dexamethasone, Midazolam and Treatment may vary due to age or medical condition  Airway Management Planned: Oral ETT  Additional Equipment: None  Intra-op Plan:   Post-operative Plan: Extubation in OR  Informed Consent: I have reviewed the patients History and Physical, chart, labs and discussed the procedure including the risks, benefits and alternatives for the proposed anesthesia with the patient or authorized representative who has indicated his/her understanding and acceptance.     Dental advisory given  Plan Discussed with: CRNA  Anesthesia Plan Comments: (PAT note written 04/11/2022 by Myra Gianotti, PA-C.  )       Anesthesia Quick Evaluation
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
## 2022-04-12 ENCOUNTER — Encounter (HOSPITAL_COMMUNITY): Payer: Self-pay | Admitting: Neurosurgery

## 2022-04-12 ENCOUNTER — Encounter (HOSPITAL_COMMUNITY)
Admission: RE | Disposition: A | Discharge status: Home Health | Payer: Self-pay | Source: Home / Self Care | Attending: Neurosurgery

## 2022-04-12 ENCOUNTER — Ambulatory Visit (HOSPITAL_COMMUNITY): Payer: Medicare Other | Admitting: Vascular Surgery

## 2022-04-12 ENCOUNTER — Other Ambulatory Visit: Payer: Self-pay

## 2022-04-12 ENCOUNTER — Ambulatory Visit (HOSPITAL_COMMUNITY): Payer: Medicare Other

## 2022-04-12 ENCOUNTER — Ambulatory Visit (HOSPITAL_COMMUNITY): Payer: Medicare Other | Admitting: Certified Registered Nurse Anesthetist

## 2022-04-12 ENCOUNTER — Inpatient Hospital Stay (HOSPITAL_COMMUNITY)
Admission: RE | Admit: 2022-04-12 | Discharge: 2022-04-15 | DRG: 455 | Disposition: A | Discharge status: Home Health | Payer: Medicare Other | Attending: Neurosurgery | Admitting: Neurosurgery

## 2022-04-12 DIAGNOSIS — M5116 Intervertebral disc disorders with radiculopathy, lumbar region: Secondary | ICD-10-CM | POA: Diagnosis present

## 2022-04-12 DIAGNOSIS — K7460 Unspecified cirrhosis of liver: Secondary | ICD-10-CM | POA: Diagnosis present

## 2022-04-12 DIAGNOSIS — K449 Diaphragmatic hernia without obstruction or gangrene: Secondary | ICD-10-CM

## 2022-04-12 DIAGNOSIS — Z96653 Presence of artificial knee joint, bilateral: Secondary | ICD-10-CM | POA: Diagnosis present

## 2022-04-12 DIAGNOSIS — Z7901 Long term (current) use of anticoagulants: Secondary | ICD-10-CM

## 2022-04-12 DIAGNOSIS — N401 Enlarged prostate with lower urinary tract symptoms: Secondary | ICD-10-CM | POA: Diagnosis present

## 2022-04-12 DIAGNOSIS — R519 Headache, unspecified: Secondary | ICD-10-CM | POA: Diagnosis not present

## 2022-04-12 DIAGNOSIS — Z981 Arthrodesis status: Secondary | ICD-10-CM

## 2022-04-12 DIAGNOSIS — Z833 Family history of diabetes mellitus: Secondary | ICD-10-CM

## 2022-04-12 DIAGNOSIS — I10 Essential (primary) hypertension: Secondary | ICD-10-CM

## 2022-04-12 DIAGNOSIS — M4316 Spondylolisthesis, lumbar region: Secondary | ICD-10-CM | POA: Diagnosis not present

## 2022-04-12 DIAGNOSIS — K219 Gastro-esophageal reflux disease without esophagitis: Secondary | ICD-10-CM | POA: Diagnosis present

## 2022-04-12 DIAGNOSIS — Z8249 Family history of ischemic heart disease and other diseases of the circulatory system: Secondary | ICD-10-CM

## 2022-04-12 DIAGNOSIS — Z87891 Personal history of nicotine dependence: Secondary | ICD-10-CM

## 2022-04-12 DIAGNOSIS — M1990 Unspecified osteoarthritis, unspecified site: Secondary | ICD-10-CM | POA: Diagnosis not present

## 2022-04-12 DIAGNOSIS — M5136 Other intervertebral disc degeneration, lumbar region: Secondary | ICD-10-CM | POA: Diagnosis present

## 2022-04-12 DIAGNOSIS — E039 Hypothyroidism, unspecified: Secondary | ICD-10-CM | POA: Diagnosis present

## 2022-04-12 DIAGNOSIS — Z8349 Family history of other endocrine, nutritional and metabolic diseases: Secondary | ICD-10-CM

## 2022-04-12 DIAGNOSIS — Z79899 Other long term (current) drug therapy: Secondary | ICD-10-CM

## 2022-04-12 DIAGNOSIS — E0500 Thyrotoxicosis with diffuse goiter without thyrotoxic crisis or storm: Secondary | ICD-10-CM | POA: Diagnosis present

## 2022-04-12 DIAGNOSIS — R338 Other retention of urine: Secondary | ICD-10-CM | POA: Diagnosis present

## 2022-04-12 DIAGNOSIS — M48062 Spinal stenosis, lumbar region with neurogenic claudication: Secondary | ICD-10-CM | POA: Diagnosis present

## 2022-04-12 DIAGNOSIS — Z79890 Hormone replacement therapy (postmenopausal): Secondary | ICD-10-CM

## 2022-04-12 SURGERY — POSTERIOR LUMBAR FUSION 1 LEVEL
Anesthesia: General

## 2022-04-12 MED ORDER — POLYETHYLENE GLYCOL 3350 17 G PO PACK
17.0000 g | PACK | Freq: Every day | ORAL | Status: DC | PRN
  Administered 2022-04-12: 17 g via ORAL
  Filled 2022-04-12: qty 1

## 2022-04-12 MED ORDER — ORAL CARE MOUTH RINSE: 15.0000 mL | Freq: Once | OROMUCOSAL | Status: AC

## 2022-04-12 MED ORDER — METOPROLOL TARTRATE 25 MG PO TABS
25.0000 mg | ORAL_TABLET | Freq: Two times a day (BID) | ORAL | Status: DC
  Administered 2022-04-12 – 2022-04-15 (×6): 25 mg via ORAL
  Filled 2022-04-12 (×6): qty 1

## 2022-04-12 MED ORDER — DEXAMETHASONE SODIUM PHOSPHATE 10 MG/ML IJ SOLN
INTRAMUSCULAR | Status: DC | PRN
  Administered 2022-04-12: 8 mg via INTRAVENOUS

## 2022-04-12 MED ORDER — ACETAMINOPHEN 10 MG/ML IV SOLN: 1000.0000 mg | Freq: Once | INTRAVENOUS | Status: DC | PRN

## 2022-04-12 MED ORDER — LABETALOL HCL 5 MG/ML IV SOLN
5.0000 mg | INTRAVENOUS | Status: DC | PRN
  Administered 2022-04-12: 5 mg via INTRAVENOUS

## 2022-04-12 MED ORDER — CYCLOBENZAPRINE HCL 10 MG PO TABS: 10.0000 mg | ORAL_TABLET | Freq: Three times a day (TID) | ORAL | Status: DC | PRN

## 2022-04-12 MED ORDER — THROMBIN 5000 UNITS EX SOLR
CUTANEOUS | Status: AC
  Filled 2022-04-12: qty 5000

## 2022-04-12 MED ORDER — BACITRACIN ZINC 500 UNIT/GM EX OINT
TOPICAL_OINTMENT | CUTANEOUS | Status: AC
  Filled 2022-04-12: qty 28.35

## 2022-04-12 MED ORDER — BACITRACIN ZINC 500 UNIT/GM EX OINT
TOPICAL_OINTMENT | CUTANEOUS | Status: DC | PRN
  Administered 2022-04-12: 1 via TOPICAL

## 2022-04-12 MED ORDER — BUPIVACAINE-EPINEPHRINE (PF) 0.25% -1:200000 IJ SOLN
INTRAMUSCULAR | Status: AC
  Filled 2022-04-12: qty 30

## 2022-04-12 MED ORDER — SODIUM CHLORIDE 0.9% FLUSH
3.0000 mL | Freq: Two times a day (BID) | INTRAVENOUS | Status: DC
  Administered 2022-04-13 – 2022-04-15 (×5): 3 mL via INTRAVENOUS

## 2022-04-12 MED ORDER — ACETAMINOPHEN 160 MG/5ML PO SOLN: 325.0000 mg | Freq: Once | ORAL | Status: DC | PRN

## 2022-04-12 MED ORDER — CHLORHEXIDINE GLUCONATE CLOTH 2 % EX PADS: 6.0000 | MEDICATED_PAD | Freq: Once | CUTANEOUS | Status: DC

## 2022-04-12 MED ORDER — ACETAMINOPHEN 500 MG PO TABS
1000.0000 mg | ORAL_TABLET | Freq: Four times a day (QID) | ORAL | Status: AC
  Administered 2022-04-12: 1000 mg via ORAL
  Filled 2022-04-12 (×2): qty 2

## 2022-04-12 MED ORDER — PHENOL 1.4 % MT LIQD: 1.0000 | OROMUCOSAL | Status: DC | PRN

## 2022-04-12 MED ORDER — ACETAMINOPHEN 10 MG/ML IV SOLN
INTRAVENOUS | Status: DC | PRN
  Administered 2022-04-12: 1000 mg via INTRAVENOUS

## 2022-04-12 MED ORDER — ACETAMINOPHEN 650 MG RE SUPP: 650.0000 mg | RECTAL | Status: DC | PRN

## 2022-04-12 MED ORDER — EPHEDRINE 5 MG/ML INJ
INTRAVENOUS | Status: AC
  Filled 2022-04-12: qty 5

## 2022-04-12 MED ORDER — MEPERIDINE HCL 25 MG/ML IJ SOLN: 6.2500 mg | INTRAMUSCULAR | Status: DC | PRN

## 2022-04-12 MED ORDER — SODIUM CHLORIDE 0.9% FLUSH: 3.0000 mL | INTRAVENOUS | Status: DC | PRN

## 2022-04-12 MED ORDER — ACETAMINOPHEN 10 MG/ML IV SOLN
INTRAVENOUS | Status: AC
  Filled 2022-04-12: qty 100

## 2022-04-12 MED ORDER — OXYCODONE HCL 5 MG PO TABS
10.0000 mg | ORAL_TABLET | ORAL | Status: DC | PRN
  Administered 2022-04-13 (×2): 10 mg via ORAL
  Administered 2022-04-14: 5 mg via ORAL
  Administered 2022-04-14 – 2022-04-15 (×3): 10 mg via ORAL
  Filled 2022-04-12 (×7): qty 2

## 2022-04-12 MED ORDER — ONDANSETRON HCL 4 MG PO TABS: 4.0000 mg | ORAL_TABLET | Freq: Four times a day (QID) | ORAL | Status: DC | PRN

## 2022-04-12 MED ORDER — TAMSULOSIN HCL 0.4 MG PO CAPS
0.4000 mg | ORAL_CAPSULE | Freq: Every day | ORAL | Status: DC
  Administered 2022-04-12 – 2022-04-15 (×4): 0.4 mg via ORAL
  Filled 2022-04-12 (×4): qty 1

## 2022-04-12 MED ORDER — ACETAMINOPHEN 325 MG PO TABS
650.0000 mg | ORAL_TABLET | ORAL | Status: DC | PRN
  Administered 2022-04-13 – 2022-04-14 (×5): 650 mg via ORAL
  Filled 2022-04-12 (×5): qty 2

## 2022-04-12 MED ORDER — SUGAMMADEX SODIUM 200 MG/2ML IV SOLN
INTRAVENOUS | Status: DC | PRN
  Administered 2022-04-12: 233 mg via INTRAVENOUS

## 2022-04-12 MED ORDER — ONDANSETRON HCL 4 MG/2ML IJ SOLN
4.0000 mg | Freq: Four times a day (QID) | INTRAMUSCULAR | Status: DC | PRN
  Administered 2022-04-14: 4 mg via INTRAVENOUS
  Filled 2022-04-12: qty 2

## 2022-04-12 MED ORDER — DOCUSATE SODIUM 100 MG PO CAPS
100.0000 mg | ORAL_CAPSULE | Freq: Two times a day (BID) | ORAL | Status: DC
  Administered 2022-04-12 – 2022-04-15 (×6): 100 mg via ORAL
  Filled 2022-04-12 (×6): qty 1

## 2022-04-12 MED ORDER — OXYCODONE HCL 5 MG PO TABS
5.0000 mg | ORAL_TABLET | ORAL | Status: DC | PRN
  Administered 2022-04-13 – 2022-04-14 (×4): 5 mg via ORAL
  Filled 2022-04-12 (×3): qty 1

## 2022-04-12 MED ORDER — THROMBIN 20000 UNITS EX SOLR
CUTANEOUS | Status: AC
  Filled 2022-04-12: qty 20000

## 2022-04-12 MED ORDER — MIDAZOLAM HCL 2 MG/2ML IJ SOLN
INTRAMUSCULAR | Status: DC | PRN
  Administered 2022-04-12 (×2): 1 mg via INTRAVENOUS

## 2022-04-12 MED ORDER — CHLORHEXIDINE GLUCONATE 0.12 % MT SOLN
15.0000 mL | Freq: Once | OROMUCOSAL | Status: AC
  Administered 2022-04-12: 15 mL via OROMUCOSAL
  Filled 2022-04-12: qty 15

## 2022-04-12 MED ORDER — CEFAZOLIN SODIUM-DEXTROSE 2-4 GM/100ML-% IV SOLN
2.0000 g | Freq: Three times a day (TID) | INTRAVENOUS | Status: AC
  Administered 2022-04-12 – 2022-04-13 (×2): 2 g via INTRAVENOUS
  Filled 2022-04-12 (×2): qty 100

## 2022-04-12 MED ORDER — GELATIN ABSORBABLE 100 CM EX MISC
CUTANEOUS | Status: DC | PRN
  Administered 2022-04-12: 1 via TOPICAL

## 2022-04-12 MED ORDER — ALBUMIN HUMAN 5 % IV SOLN: INTRAVENOUS | Status: DC | PRN

## 2022-04-12 MED ORDER — ROCURONIUM BROMIDE 10 MG/ML (PF) SYRINGE
PREFILLED_SYRINGE | INTRAVENOUS | Status: AC
  Filled 2022-04-12: qty 10

## 2022-04-12 MED ORDER — MIDAZOLAM HCL 2 MG/2ML IJ SOLN
INTRAMUSCULAR | Status: AC
  Filled 2022-04-12: qty 2

## 2022-04-12 MED ORDER — BUPIVACAINE-EPINEPHRINE (PF) 0.5% -1:200000 IJ SOLN
INTRAMUSCULAR | Status: DC | PRN
  Administered 2022-04-12: 10 mL

## 2022-04-12 MED ORDER — PHENYLEPHRINE 80 MCG/ML (10ML) SYRINGE FOR IV PUSH (FOR BLOOD PRESSURE SUPPORT)
PREFILLED_SYRINGE | INTRAVENOUS | Status: DC | PRN
  Administered 2022-04-12 (×2): 80 ug via INTRAVENOUS

## 2022-04-12 MED ORDER — ACETAMINOPHEN 500 MG PO TABS: 1000.0000 mg | ORAL_TABLET | Freq: Once | ORAL | Status: DC

## 2022-04-12 MED ORDER — VANCOMYCIN HCL 1000 MG IV SOLR
INTRAVENOUS | Status: DC | PRN
  Administered 2022-04-12: 1000 mg

## 2022-04-12 MED ORDER — HYDROMORPHONE HCL 1 MG/ML IJ SOLN
0.2500 mg | INTRAMUSCULAR | Status: DC | PRN
  Administered 2022-04-12 (×3): 0.5 mg via INTRAVENOUS

## 2022-04-12 MED ORDER — LIDOCAINE 2% (20 MG/ML) 5 ML SYRINGE
INTRAMUSCULAR | Status: DC | PRN
  Administered 2022-04-12: 60 mg via INTRAVENOUS

## 2022-04-12 MED ORDER — HYDROMORPHONE HCL 1 MG/ML IJ SOLN
INTRAMUSCULAR | Status: AC
  Filled 2022-04-12: qty 1

## 2022-04-12 MED ORDER — LEVOTHYROXINE SODIUM 75 MCG PO TABS
175.0000 ug | ORAL_TABLET | Freq: Every day | ORAL | Status: DC
  Administered 2022-04-13 – 2022-04-15 (×3): 175 ug via ORAL
  Filled 2022-04-12 (×3): qty 1

## 2022-04-12 MED ORDER — LACTATED RINGERS IV SOLN: INTRAVENOUS | Status: DC

## 2022-04-12 MED ORDER — BACLOFEN 5 MG HALF TABLET: 5.0000 mg | ORAL_TABLET | Freq: Three times a day (TID) | ORAL | Status: DC | PRN

## 2022-04-12 MED ORDER — PANTOPRAZOLE SODIUM 40 MG PO TBEC
40.0000 mg | DELAYED_RELEASE_TABLET | Freq: Two times a day (BID) | ORAL | Status: DC
  Administered 2022-04-12 – 2022-04-15 (×6): 40 mg via ORAL
  Filled 2022-04-12 (×6): qty 1

## 2022-04-12 MED ORDER — LABETALOL HCL 5 MG/ML IV SOLN
INTRAVENOUS | Status: AC
  Filled 2022-04-12: qty 4

## 2022-04-12 MED ORDER — PROMETHAZINE HCL 25 MG/ML IJ SOLN: 6.2500 mg | INTRAMUSCULAR | Status: DC | PRN

## 2022-04-12 MED ORDER — ROCURONIUM BROMIDE 10 MG/ML (PF) SYRINGE
PREFILLED_SYRINGE | INTRAVENOUS | Status: DC | PRN
  Administered 2022-04-12: 20 mg via INTRAVENOUS
  Administered 2022-04-12: 40 mg via INTRAVENOUS
  Administered 2022-04-12: 20 mg via INTRAVENOUS
  Administered 2022-04-12: 60 mg via INTRAVENOUS

## 2022-04-12 MED ORDER — SODIUM CHLORIDE 0.9 % IV SOLN: 250.0000 mL | INTRAVENOUS | Status: DC

## 2022-04-12 MED ORDER — ACETAMINOPHEN 325 MG PO TABS: 325.0000 mg | ORAL_TABLET | Freq: Once | ORAL | Status: DC | PRN

## 2022-04-12 MED ORDER — AMISULPRIDE (ANTIEMETIC) 5 MG/2ML IV SOLN: 10.0000 mg | Freq: Once | INTRAVENOUS | Status: DC | PRN

## 2022-04-12 MED ORDER — CEFAZOLIN SODIUM-DEXTROSE 2-4 GM/100ML-% IV SOLN
2.0000 g | INTRAVENOUS | Status: AC
  Administered 2022-04-12 (×2): 2 g via INTRAVENOUS
  Filled 2022-04-12: qty 100

## 2022-04-12 MED ORDER — VANCOMYCIN HCL 1000 MG IV SOLR
INTRAVENOUS | Status: AC
  Filled 2022-04-12: qty 20

## 2022-04-12 MED ORDER — FENTANYL CITRATE (PF) 250 MCG/5ML IJ SOLN
INTRAMUSCULAR | Status: AC
  Filled 2022-04-12: qty 5

## 2022-04-12 MED ORDER — PHENYLEPHRINE HCL-NACL 20-0.9 MG/250ML-% IV SOLN
INTRAVENOUS | Status: DC | PRN
  Administered 2022-04-12: 25 ug/min via INTRAVENOUS

## 2022-04-12 MED ORDER — FENTANYL CITRATE (PF) 250 MCG/5ML IJ SOLN
INTRAMUSCULAR | Status: DC | PRN
  Administered 2022-04-12: 50 ug via INTRAVENOUS
  Administered 2022-04-12: 100 ug via INTRAVENOUS
  Administered 2022-04-12 (×2): 50 ug via INTRAVENOUS

## 2022-04-12 MED ORDER — BISACODYL 10 MG RE SUPP: 10.0000 mg | Freq: Every day | RECTAL | Status: DC | PRN

## 2022-04-12 MED ORDER — THROMBIN 5000 UNITS EX SOLR
OROMUCOSAL | Status: DC | PRN
  Administered 2022-04-12 (×2): 5 mL via TOPICAL

## 2022-04-12 MED ORDER — CEFAZOLIN SODIUM 1 G IJ SOLR
INTRAMUSCULAR | Status: AC
  Filled 2022-04-12: qty 20

## 2022-04-12 MED ORDER — SODIUM CHLORIDE 0.9 % IV SOLN: INTRAVENOUS | Status: DC | PRN

## 2022-04-12 MED ORDER — PROPOFOL 10 MG/ML IV BOLUS
INTRAVENOUS | Status: DC | PRN
  Administered 2022-04-12: 140 mg via INTRAVENOUS

## 2022-04-12 MED ORDER — MORPHINE SULFATE (PF) 4 MG/ML IV SOLN
4.0000 mg | INTRAVENOUS | Status: DC | PRN
  Administered 2022-04-14 – 2022-04-15 (×2): 4 mg via INTRAVENOUS
  Filled 2022-04-12 (×2): qty 1

## 2022-04-12 MED ORDER — MENTHOL 3 MG MT LOZG: 1.0000 | LOZENGE | OROMUCOSAL | Status: DC | PRN

## 2022-04-12 MED ORDER — ONDANSETRON HCL 4 MG/2ML IJ SOLN
INTRAMUSCULAR | Status: DC | PRN
  Administered 2022-04-12: 4 mg via INTRAVENOUS

## 2022-04-12 SURGICAL SUPPLY — 67 items
APL SKNCLS STERI-STRIP NONHPOA (GAUZE/BANDAGES/DRESSINGS) ×1
BAG COUNTER SPONGE SURGICOUNT (BAG) ×1 IMPLANT
BAG SPNG CNTER NS LX DISP (BAG) ×1
BASKET BONE COLLECTION (BASKET) ×1 IMPLANT
BENZOIN TINCTURE PRP APPL 2/3 (GAUZE/BANDAGES/DRESSINGS) ×1 IMPLANT
BLADE CLIPPER SURG (BLADE) IMPLANT
BUR MATCHSTICK NEURO 3.0 LAGG (BURR) ×1 IMPLANT
BUR PRECISION FLUTE 6.0 (BURR) ×1 IMPLANT
CANISTER SUCT 3000ML PPV (MISCELLANEOUS) ×1 IMPLANT
CAP LOCK DLX THRD (Cap) IMPLANT
CNTNR URN SCR LID CUP LEK RST (MISCELLANEOUS) ×1 IMPLANT
CONT SPEC 4OZ STRL OR WHT (MISCELLANEOUS) ×1
COVER BACK TABLE 60X90IN (DRAPES) ×1 IMPLANT
DRAPE C-ARM 42X72 X-RAY (DRAPES) ×2 IMPLANT
DRAPE HALF SHEET 40X57 (DRAPES) ×1 IMPLANT
DRAPE LAPAROTOMY 100X72X124 (DRAPES) ×1 IMPLANT
DRAPE SURG 17X23 STRL (DRAPES) ×4 IMPLANT
DRSG OPSITE POSTOP 4X6 (GAUZE/BANDAGES/DRESSINGS) ×1 IMPLANT
ELECT BLADE 4.0 EZ CLEAN MEGAD (MISCELLANEOUS) ×1
ELECT REM PT RETURN 9FT ADLT (ELECTROSURGICAL) ×1
ELECTRODE BLDE 4.0 EZ CLN MEGD (MISCELLANEOUS) ×1 IMPLANT
ELECTRODE REM PT RTRN 9FT ADLT (ELECTROSURGICAL) ×1 IMPLANT
EVACUATOR 1/8 PVC DRAIN (DRAIN) IMPLANT
GAUZE 4X4 16PLY ~~LOC~~+RFID DBL (SPONGE) ×1 IMPLANT
GLOVE BIO SURGEON STRL SZ 6 (GLOVE) ×1 IMPLANT
GLOVE BIO SURGEON STRL SZ8 (GLOVE) ×2 IMPLANT
GLOVE BIO SURGEON STRL SZ8.5 (GLOVE) ×2 IMPLANT
GLOVE BIOGEL PI IND STRL 6.5 (GLOVE) ×1 IMPLANT
GLOVE EXAM NITRILE XL STR (GLOVE) IMPLANT
GOWN STRL REUS W/ TWL LRG LVL3 (GOWN DISPOSABLE) ×1 IMPLANT
GOWN STRL REUS W/ TWL XL LVL3 (GOWN DISPOSABLE) ×2 IMPLANT
GOWN STRL REUS W/TWL 2XL LVL3 (GOWN DISPOSABLE) IMPLANT
GOWN STRL REUS W/TWL LRG LVL3 (GOWN DISPOSABLE) ×1
GOWN STRL REUS W/TWL XL LVL3 (GOWN DISPOSABLE) ×2
GRAFT DURAGEN MATRIX 1WX1L (Tissue) IMPLANT
HEMOSTAT POWDER KIT SURGIFOAM (HEMOSTASIS) ×1 IMPLANT
KIT BASIN OR (CUSTOM PROCEDURE TRAY) ×1 IMPLANT
KIT GRAFTMAG DEL NEURO DISP (NEUROSURGERY SUPPLIES) IMPLANT
KIT TURNOVER KIT B (KITS) ×1 IMPLANT
NDL HYPO 21X1.5 SAFETY (NEEDLE) IMPLANT
NEEDLE HYPO 21X1.5 SAFETY (NEEDLE) IMPLANT
NEEDLE HYPO 22GX1.5 SAFETY (NEEDLE) ×1 IMPLANT
NS IRRIG 1000ML POUR BTL (IV SOLUTION) ×1 IMPLANT
PACK LAMINECTOMY NEURO (CUSTOM PROCEDURE TRAY) ×1 IMPLANT
PAD ARMBOARD 7.5X6 YLW CONV (MISCELLANEOUS) ×3 IMPLANT
PATTIES SURGICAL .5 X1 (DISPOSABLE) IMPLANT
PATTIES SURGICAL 1X1 (DISPOSABLE) IMPLANT
PUTTY DBM 10CC CALC GRAN (Putty) IMPLANT
ROD CREO DLX CVD 6.35X40 (Rod) IMPLANT
ROD CURVED TI 6.35X40 (Rod) ×2 IMPLANT
SCREW PA DLX CREO 7.5X50 (Screw) IMPLANT
SCREW PA DLX CREO 7.5X55 (Screw) IMPLANT
SPACER ALTERA 10X31 8-12MM-8 (Spacer) IMPLANT
SPIKE FLUID TRANSFER (MISCELLANEOUS) ×1 IMPLANT
SPONGE NEURO XRAY DETECT 1X3 (DISPOSABLE) IMPLANT
SPONGE SURGIFOAM ABS GEL 100 (HEMOSTASIS) IMPLANT
SPONGE T-LAP 4X18 ~~LOC~~+RFID (SPONGE) IMPLANT
STRIP CLOSURE SKIN 1/2X4 (GAUZE/BANDAGES/DRESSINGS) ×1 IMPLANT
SUT PROLENE 6 0 BV (SUTURE) IMPLANT
SUT VIC AB 1 CT1 18XBRD ANBCTR (SUTURE) ×2 IMPLANT
SUT VIC AB 1 CT1 8-18 (SUTURE) ×3
SUT VIC AB 2-0 CP2 18 (SUTURE) ×2 IMPLANT
SYR 20ML LL LF (SYRINGE) IMPLANT
TOWEL GREEN STERILE (TOWEL DISPOSABLE) ×1 IMPLANT
TOWEL GREEN STERILE FF (TOWEL DISPOSABLE) ×1 IMPLANT
TRAY FOLEY MTR SLVR 16FR STAT (SET/KITS/TRAYS/PACK) ×1 IMPLANT
WATER STERILE IRR 1000ML POUR (IV SOLUTION) ×1 IMPLANT

## 2022-04-12 NOTE — Anesthesia Procedure Notes (Signed)
Procedure Name: Intubation Date/Time: 04/12/2022 12:19 PM  Performed by: Janene Harvey, CRNAPre-anesthesia Checklist: Patient identified, Emergency Drugs available, Suction available and Patient being monitored Patient Re-evaluated:Patient Re-evaluated prior to induction Oxygen Delivery Method: Circle system utilized Preoxygenation: Pre-oxygenation with 100% oxygen Induction Type: IV induction Ventilation: Mask ventilation without difficulty and Oral airway inserted - appropriate to patient size Laryngoscope Size: Glidescope and 3 Grade View: Grade I Tube type: Oral Tube size: 7.5 mm Number of attempts: 1 Airway Equipment and Method: Stylet and Oral airway Placement Confirmation: ETT inserted through vocal cords under direct vision, positive ETCO2 and breath sounds checked- equal and bilateral Secured at: 22 cm Tube secured with: Tape Dental Injury: Teeth and Oropharynx as per pre-operative assessment  Comments: Elective - Hx acdf/hx glidescope intubation
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
## 2022-04-12 NOTE — Op Note (Addendum)
Brief history: The patient is a 67 year old white male on whom I previously formed a lumbar laminectomy years ago.  He has developed progressively worsening neurogenic claudication.  He failed medical management.  He was worked up with a lumbar myelo CT which demonstrated severe spinal stenosis at L4-5 with diffuse degenerative changes.  I discussed the various treatment options with him.  He has decided proceed with surgery.  Preoperative diagnosis: L4-5 spondylolisthesis, degenerative disc disease, spinal stenosis compressing both the L4 and the L5 nerve roots; lumbago; lumbar radiculopathy; neurogenic claudication  Postoperative diagnosis: Same  Procedure: Bilateral L4-5 redo laminotomy/foraminotomies/medial facetectomy to decompress the bilateral L4 and L5 nerve roots(the work required to do this was in addition to the work required to do the posterior lumbar interbody fusion because of the patient's spinal stenosis, facet arthropathy. Etc. requiring a wide decompression of the nerve roots.);  Right L4-5 transforaminal lumbar interbody fusion with local morselized autograft bone and Zimmer DBM; insertion of interbody prosthesis at L4-5 (globus peek expandable interbody prosthesis); posterior segmental instrumentation from L4 to L5 with globus titanium pedicle screws and rods; posterior lateral arthrodesis at L4-5 with local morselized autograft bone and Zimmer DBM.  Surgeon: Dr. Earle Gell  Asst.: Kristeen Miss and Arnetha Massy, NP  Anesthesia: Gen. endotracheal  Estimated blood loss: 250 cc  Drains: None  Complications: None  Description of procedure: The patient was brought to the operating room by the anesthesia team. General endotracheal anesthesia was induced. The patient was turned to the prone position on the Wilson frame. The patient's lumbosacral region was then prepared with Betadine scrub and Betadine solution. Sterile drapes were applied.  I then injected the area to be  incised with Marcaine with epinephrine solution. I then used the scalpel to make a linear midline incision over the L4-5 interspace, incising through the old surgical scar. I then used electrocautery to perform a bilateral subperiosteal dissection exposing the facets and lamina of L4-5. We then obtained intraoperative radiograph to confirm our location. We then inserted the Verstrac retractor to provide exposure.  I began the decompression by using the high speed drill to perform redo laminotomies at L4-5 bilaterally. We then used the Kerrison punches to widen the laminotomy and removed the dural scar tissue and remainder of the ligamentum flavum at L4-5. We used the Kerrison punches to remove the medial facets at L4-5 bilaterally, we removed the right L4-5 facet. We performed wide foraminotomies about the bilateral L4 and L5 nerve roots completing the decompression.  I did create a midline durotomy which I repaired with 0 Prolene sutures.  At the end of the case I placed DuraGen over the repair.  We now turned our attention to the posterior lumbar interbody fusion. I used a scalpel to incise the intervertebral disc at L4-5 on the right. I then performed a partial intervertebral discectomy at 4 5 from the right using the pituitary forceps. We prepared the vertebral endplates at 075-GRM for the fusion by removing the soft tissues with the curettes. We then used the trial spacers to pick the appropriate sized interbody prosthesis. We prefilled his prosthesis with a combination of local morselized autograft bone that we obtained during the decompression as well as Zimmer DBM. We inserted the prefilled prosthesis into the interspace at L4-5 from the right, we then turned and expanded the prosthesis. There was a good snug fit of the prosthesis in the interspace. We then filled and the remainder of the intervertebral disc space with local morselized autograft bone  and Zimmer DBM. This completed the posterior lumbar  interbody arthrodesis.  During the decompression and insertion of the prosthesis the assistant protected the thecal sac and nerve roots with the D'Errico retractor.  We now turned attention to the instrumentation. Under fluoroscopic guidance we cannulated the bilateral L4 and L5 pedicles with the bone probe. We then removed the bone probe. We then tapped the pedicle with a 6.5 millimeter tap. We then removed the tap. We probed inside the tapped pedicle with a ball probe to rule out cortical breaches. We then inserted a 7.5 x 50 and 55 millimeter pedicle screw into the L4 and L5 pedicles bilaterally under fluoroscopic guidance. We then palpated along the medial aspect of the pedicles to rule out cortical breaches. There were none. The nerve roots were not injured. We then connected the unilateral pedicle screws with a lordotic rod. We secured the rod in place with the caps. We then tightened the caps appropriately. This completed the instrumentation from L4-5.  We now turned our attention to the posterior lateral arthrodesis at L4-5. We used the high-speed drill to decorticate the remainder of the facets, pars, transverse process at L4-5. We then applied a combination of local morselized autograft bone and Zimmer DBM over these decorticated posterior lateral structures. This completed the posterior lateral arthrodesis.  We then obtained hemostasis using bipolar electrocautery. We irrigated the wound out with saline solution. We inspected the thecal sac and nerve roots and noted they were well decompressed. We then removed the retractor.  We injected Exparel . We reapproximated patient's thoracolumbar fascia with interrupted #1 Vicryl suture. We reapproximated patient's subcutaneous tissue with interrupted 2-0 Vicryl suture. The reapproximated patient's skin with Steri-Strips and benzoin. The wound was then coated with bacitracin ointment. A sterile dressing was applied. The drapes were removed. The patient was  subsequently returned to the supine position where they were extubated by the anesthesia team. He was then transported to the post anesthesia care unit in stable condition. All sponge instrument and needle counts were reportedly correct at the end of this case.

## 2022-04-12 NOTE — Progress Notes (Signed)
1915 as time

## 2022-04-12 NOTE — Transfer of Care (Signed)
Immediate Anesthesia Transfer of Care Note  Patient: Darren Harrison  Procedure(s) Performed: Posterior Lateral Interbody Fusion ,Interbody Prosthesis ,POSTERIOR INSTRUMENTATION, Lumbar Four- Five  Patient Location: PACU  Anesthesia Type:General  Level of Consciousness: awake, alert , and oriented  Airway & Oxygen Therapy: Patient Spontanous Breathing and Patient connected to nasal cannula oxygen  Post-op Assessment: Report given to RN, Post -op Vital signs reviewed and stable, Patient moving all extremities X 4, and Patient able to stick tongue midline  Post vital signs: Reviewed  Last Vitals:  Vitals Value Taken Time  BP 147/102 04/12/22 1747  Temp 97.6   Pulse 84 04/12/22 1748  Resp 22 04/12/22 1748  SpO2 96 % 04/12/22 1748  Vitals shown include unvalidated device data.  Last Pain:  Vitals:   04/12/22 1001  TempSrc:   PainSc: 3       Patients Stated Pain Goal: 1 ([id number])  Complications: No notable events documented.

## 2022-04-13 DIAGNOSIS — Z981 Arthrodesis status: Secondary | ICD-10-CM

## 2022-04-13 NOTE — Anesthesia Postprocedure Evaluation (Signed)
Anesthesia Post Note  Patient: Darren Harrison  Procedure(s) Performed: Posterior Lateral Interbody Fusion ,Interbody Prosthesis ,POSTERIOR INSTRUMENTATION, Lumbar Four- Five     Patient location during evaluation: PACU Anesthesia Type: General Level of consciousness: awake and alert Pain management: pain level controlled Vital Signs Assessment: post-procedure vital signs reviewed and stable Respiratory status: spontaneous breathing, nonlabored ventilation, respiratory function stable and patient connected to nasal cannula oxygen Cardiovascular status: blood pressure returned to baseline and stable Postop Assessment: no apparent nausea or vomiting Anesthetic complications: no   No notable events documented.               Effie Berkshire

## 2022-04-13 NOTE — Evaluation (Signed)
Occupational Therapy Evaluation Patient Details Name: Darren Harrison MRN: [id number] DOB: [birth year] Today's Date: 04/13/2022   History of Present Illness 67 yo s/p L4-5 PLIF. PMH includes: prior cervical surgeries and fusions (C4-6 ACDF), and lumbar surgery (L3-5), R elbow fx and surgery, R ulnnar nerve injury. B TKA.   Clinical Impression   Pt complaining of HA - Dr Arnoldo Morale aware. PTA pt lives with his wife and was modified independent with mobility and ADL, however wife provides S for shower transfers. Pt states he has been using a w/c primarily for the past 2 years and has recently begun using a rollator for the past month. Pt heavily relying on BUE for support with rollator and may be safer with use of RW - discussed with PT who will further assess. Pt requiring increased assistance for ADL and is limited by mobility at this time primarily due to headache and neck pain.  Given prior history and recent use of AD for ambulation, recommend follow up with HHOT to maximize functional level of independence and reduce risk of falls. Acute OT to follow.      Recommendations for follow up therapy are one component of a multi-disciplinary discharge planning process, led by the attending physician.  Recommendations may be updated based on patient status, additional functional criteria and insurance authorization.   Follow Up Recommendations  Home health OT     Assistance Recommended at Discharge Intermittent Supervision/Assistance (wife can initially provide 24/7)  Patient can return home with the following A little help with walking and/or transfers;A little help with bathing/dressing/bathroom;Assistance with cooking/housework;Assist for transportation    Functional Status Assessment  Patient has had a recent decline in their functional status and demonstrates the ability to make significant improvements in function in a reasonable and predictable amount of time.  Equipment Recommendations  None  recommended by OT    Recommendations for Other Services       Precautions / Restrictions Precautions Precautions: Fall;Back Precaution Booklet Issued: Yes (comment) - began reviewing Required Braces or Orthoses: Spinal Brace Spinal Brace: Lumbar corset      Mobility Bed Mobility Overal bed mobility: Needs Assistance Bed Mobility: Rolling, Sidelying to Sit Rolling: Supervision Sidelying to sit: Min guard (heavy use of rails; has a bed rail if needed)            Transfers Overall transfer level: Needs assistance Equipment used: Rollator (4 wheels) Transfers: Sit to/from Stand Sit to Stand: Min assist, From elevated surface                  Balance Overall balance assessment: Mild deficits observed, not formally tested                                         ADL either performed or assessed with clinical judgement   ADL Overall ADL's : Needs assistance/impaired     Grooming: Supervision/safety;Set up;Sitting   Upper Body Bathing: Set up;Supervision/ safety;Sitting   Lower Body Bathing: Moderate assistance;Sit to/from stand   Upper Body Dressing : Set up;Supervision/safety;Moderate assistance (mod A for brace)   Lower Body Dressing: Moderate assistance;Sit to/from stand   Toilet Transfer: Minimal assistance;Ambulation   Toileting- Clothing Manipulation and Hygiene: Total assistance (foley; began educating pt on availability of toilet tong if needed; encouraged pt to trial if he could reach his periarea  adn if not he may need ot use toilet  tongs)       Functional mobility during ADLs: Minimal assistance;Rollator (4 wheels)       Vision Baseline Vision/History: 1 Wears glasses       Perception     Praxis      Pertinent Vitals/Pain Pain Assessment Pain Assessment: 0-10 Pain Score: 7  Pain Location: head, neck mostly Pain Descriptors / Indicators: Aching, Discomfort, Grimacing Pain Intervention(s): Limited activity within  patient's tolerance, RN gave pain meds during session     Hand Dominance Right   Extremity/Trunk Assessment Upper Extremity Assessment Upper Extremity Assessment: RUE deficits/detail;LUE deficits/detail RUE Deficits / Details: "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; generalized weakness but funcitonal - "has been this way for years"; hx of RTC insufficiency LUE Deficits / Details: hx of shoulder "issues"; overall functional; copmlaining of mild numbness in index finger which he states is new   Lower Extremity Assessment Lower Extremity Assessment: Defer to PT evaluation   Cervical / Trunk Assessment Cervical / Trunk Assessment: Back Surgery;Neck Surgery;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)   Communication Communication Communication: No difficulties   Cognition Arousal/Alertness: Awake/alert Behavior During Therapy: WFL for tasks assessed/performed Overall Cognitive Status: Within Functional Limits for tasks assessed                                       General Comments       Exercises     Shoulder Instructions      Home Living Family/patient expects to be discharged to:: Private residence Living Arrangements: Spouse/significant other Available Help at Discharge: Family;Available PRN/intermittently Type of Home: House Home Access: Level entry     Home Layout: Two level;Able to live on main level with bedroom/bathroom Alternate Level Stairs-Number of Steps: full flight   Bathroom Shower/Tub: Occupational psychologist: Standard Bathroom Accessibility: Yes How Accessible: Accessible via walker Home Equipment: Toilet riser;Grab bars - toilet;Grab bars - tub/shower;Wheelchair - Publishing copy (2 wheels);Shower seat;BSC/3in1;Adaptive equipment;Hand held shower head Adaptive Equipment: Reacher;Long-handled sponge Additional Comments: wife works however can check on her husband throughout the day      Prior Functioning/Environment  Prior Level of Function : Independent/Modified Independent             Mobility Comments: Has used a wc for the past 2 years; has recently started using a rollator in the past month ADLs Comments: mod I however wife supervises shower transfers        OT Problem List: Decreased activity tolerance;Decreased range of motion;Impaired balance (sitting and/or standing);Decreased safety awareness;Decreased knowledge of use of DME or AE;Cardiopulmonary status limiting activity;Pain;Obesity      OT Treatment/Interventions: Self-care/ADL training;Therapeutic exercise;DME and/or AE instruction;Therapeutic activities;Patient/family education    OT Goals(Current goals can be found in the care plan section) Acute Rehab OT Goals Patient Stated Goal: to become stronger OT Goal Formulation: With patient Time For Goal Achievement: 04/27/22 Potential to Achieve Goals: Good  OT Frequency: Min 2X/week    Co-evaluation              AM-PAC OT "6 Clicks" Daily Activity     Outcome Measure Help from another person eating meals?: None Help from another person taking care of personal grooming?: A Little Help from another person toileting, which includes using toliet, bedpan, or urinal?: A Lot Help from another person bathing (including washing, rinsing, drying)?: A Lot Help from another person to put on and  taking off regular upper body clothing?: A Lot Help from another person to put on and taking off regular lower body clothing?: A Lot 6 Click Score: 15   End of Session Equipment Utilized During Treatment: Gait belt;Rollator (4 wheels)  Activity Tolerance: Patient tolerated treatment well Patient left: in bed;with call bell/phone within reach;with family/visitor present  OT Visit Diagnosis: Unsteadiness on feet (R26.81);Other abnormalities of gait and mobility (R26.89);Muscle weakness (generalized) (M62.81);Pain Pain - part of body:  (neck; head)                Time: [id number] OT Time  Calculation (min): 34 min Charges:  OT General Charges $OT Visit: 1 Visit OT Evaluation $OT Eval Moderate Complexity: 1 Mod OT Treatments $Self Care/Home Management : 8-22 mins  Maurie Boettcher, OT/L   Acute OT Clinical Specialist [place] Pager [phone] Office [phone]   [facility] 04/13/2022, 10:05 AM

## 2022-04-13 NOTE — Progress Notes (Signed)
Orthopedic Tech Progress Note Patient Details:  Darren Harrison 1955/07/12 [id number]  Ortho Devices Type of Ortho Device: Lumbar corsett Ortho Device/Splint Location: BACK Ortho Device/Splint Interventions: Ordered   Post Interventions Patient Tolerated: Well Instructions Provided: Care of device  [name] 04/13/2022, 12:15 AM

## 2022-04-13 NOTE — TOC Initial Note (Addendum)
Transition of Care [facility]) - Initial/Assessment Note    Patient Details  Name: Darren Harrison MRN: [id number] Date of Birth: 1955/05/29  Transition of [facility]) CM/SW Contact:    Curlene Labrum, RN Phone Number: 04/13/2022, 3:57 PM  Clinical Narrative:                 CM met with the patient and wife at the bedside to discuss TOC needs to return home - S/P bilateral L4-5 redo Laminectomy.  The patient has a bulky dry dressing on his back that is clean and dry with no drains present.  The patient has a foley at this time that will likely be removed prior to discharge to home.  The patient and wife were provided with Medicare Observation notice since the patient was changed from Inpatient to Observation today.  DME at the home include rolator, RW, 3:1 and WC,  Rolator from home is present in the hospital room.  No DME is needed.  The patient was offered Medicare choice regarding home health services and he states that he would prefer [facility].  I called and spoke with [facility] and they accepted the patient for services.  Clinicals and orders were faxed to [phone].  I placed [place] orders into EPIC and sent Jinny Blossom, NP a text to have her co-sign the order when available.  Dr. Arnoldo Morale was not available by secure chat at this time and is likely in OR.  CM will continue to follow the patient for discharge planning needs - no other needs at this time.  04/13/2022 1615 - CM spoke with Jinny Blossom, NP and she co-signed the home health orders and clinicals and orders faxed to [facility] agency at [phone].  Expected Discharge Plan: [place] Barriers to Discharge: Continued Medical Work up   Patient Goals and CMS Choice Patient states their goals for this hospitalization and ongoing recovery are:: To return home CMS Medicare.gov Compare Post Acute Care list provided to:: Patient Choice offered to / list presented to : Patient [place] ownership interest in  [facility].provided to:: Patient    Expected Discharge Plan and Services   Discharge Planning Services: CM Consult Post Acute Care Choice: [place] arrangements for the past 2 months: [place]: PT, OT [place] Agency: [place] Date [place]: 04/13/22 Time [place]: [place] Representative spoke with at [place]: [place] with [place]  Prior Living Arrangements/Services Living arrangements for the past 2 months: [place] with:: Spouse Patient language and need for interpreter reviewed:: Yes Do you feel safe going back to the place where you live?: Yes      Need for Family Participation in Patient Care: Yes (Comment) Care giver support system in place?: Yes (comment) Current home services: DME (Rolator, Rolling walker, 3:1, wheelchair at the home currently) Criminal Activity/Legal Involvement Pertinent to Current Situation/Hospitalization: No - Comment as needed  Activities of Daily Living      Permission Sought/Granted Permission sought to share information with : Case Manager, Family Supports, Customer service manager Permission granted to share information with : Yes, Verbal Permission Granted     Permission granted to share info w AGENCY: [place] - fax # [phone]  Permission granted to share info w Relationship:  spouse at bedside - Silva Bandy [phone]     Emotional Assessment Appearance:: Appears stated age Attitude/Demeanor/Rapport: Gracious Affect (typically observed): Accepting Orientation: : Oriented to Self, Oriented to Place, Oriented to  Time, Oriented to Situation Alcohol / Substance Use: Not Applicable Psych Involvement: No (comment)  Admission diagnosis:  Spondylolisthesis of lumbar region [M43.16] S/P lumbar fusion [Z98.1] Patient Active Problem List   Diagnosis Date Noted   S/P  lumbar fusion 04/13/2022   Spondylolisthesis of lumbar region 04/12/2022   Essential hypertension, benign 12/19/2021   Primary osteoarthritis of left knee 10/24/2021   S/P TKR (total knee replacement), left 10/24/2021   S/P total knee arthroplasty 06/21/2021   Primary localized osteoarthritis of right knee 06/20/2021   Hypothyroidism following radioiodine therapy 03/20/2021   Quadriplegia ([place]) 08/26/2020   Wheelchair dependence 08/26/2020   Neurogenic bowel 07/05/2020   Neurogenic bladder 07/05/2020   Spasticity 07/05/2020   Pressure injury of skin 06/29/2020   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 [place] [address] Phone: [phone] Fax: [phone]  [facility] [address] [place] [address] Phone: [phone] Fax: [phone]     Social Determinants of Health (SDOH) Social History: SDOH Screenings   Depression (PHQ2-9): Low Risk  (08/25/2021)  Tobacco Use: Medium Risk (04/12/2022)   SDOH Interventions:     Readmission Risk Interventions    04/13/2022    3:57 PM  Readmission Risk Prevention Plan  Post Dischage Appt Complete  Medication Screening Complete  Transportation Screening Complete

## 2022-04-13 NOTE — Care Management CC44 (Cosign Needed)
Condition Code 44 Documentation Completed  Patient Details  Name: Darren Harrison MRN: [id number] Date of Birth: 03/04/1955   Condition Code 44 given:  Yes Patient signature on Condition Code 44 notice:  Yes Documentation of 2 MD's agreement:  Yes Code 44 added to claim:  Yes    Curlene Labrum, RN 04/13/2022, 3:37 PM

## 2022-04-13 NOTE — Care Management Obs Status (Cosign Needed)
[place] NOTIFICATION   Patient Details  Name: Darren Harrison MRN: [id number] Date of Birth: Apr 01, 1955   Medicare Observation Status Notification Given:  Yes    Curlene Labrum, RN 04/13/2022, 3:37 PM

## 2022-04-13 NOTE — Evaluation (Signed)
Physical Therapy Evaluation  Patient Details Name: Darren Harrison MRN: [id number] DOB: 08-Jun-1955 Today's Date: 04/13/2022  History of Present Illness  Pt is a 67 y/o male who presents s/p L4-5 PLIF on 04/12/2022. PMH includes: prior cervical surgeries and fusions (C4-6 ACDF), and lumbar surgery (L3-5), R elbow fx and surgery, R ulnnar nerve injury. B TKA.   Clinical Impression  Pt admitted with above diagnosis. At the time of PT eval, pt was able to demonstrate transfers and ambulation with gross min guard assist and RW for support. Encouraged pt to utilize the RW instead of rollator at this time due to increased stability currently needed.. Pt was educated on precautions, brace application/wearing schedule, appropriate activity progression, and car transfer. Pt currently with functional limitations due to the deficits listed below (see PT Problem List). Pt will benefit from skilled PT to increase their independence and safety with mobility to allow discharge to the venue listed below.         Recommendations for follow up therapy are one component of a multi-disciplinary discharge planning process, led by the attending physician.  Recommendations may be updated based on patient status, additional functional criteria and insurance authorization.  Follow Up Recommendations Home health PT      Assistance Recommended at Discharge Intermittent Supervision/Assistance  Patient can return home with the following  A little help with walking and/or transfers;A little help with bathing/dressing/bathroom;Assistance with cooking/housework;Assist for transportation;Help with stairs or ramp for entrance    Equipment Recommendations None recommended by PT  Recommendations for Other Services       Functional Status Assessment Patient has had a recent decline in their functional status and demonstrates the ability to make significant improvements in function in a reasonable and predictable amount of time.      Precautions / Restrictions Precautions Precautions: Fall;Back Precaution Booklet Issued: Yes (comment) Precaution Comments: Reviewed handout and pt was cued for precautions during functional mobility. Required Braces or Orthoses: Spinal Brace Spinal Brace: Lumbar corset Restrictions Weight Bearing Restrictions: No      Mobility  Bed Mobility Overal bed mobility: Needs Assistance Bed Mobility: Rolling, Sit to Sidelying Rolling: Supervision       Sit to sidelying: Mod assist General bed mobility comments: Assist for LE elevation back up into bed at end of session. VC's throughout for log roll technique.    Transfers Overall transfer level: Needs assistance Equipment used: Rolling walker (2 wheels) Transfers: Sit to/from Stand Sit to Stand: Min assist, From elevated surface           General transfer comment: VC's for hand placement on seated surface for safety. Assist to achieve full stand.    Ambulation/Gait Ambulation/Gait assistance: Min guard Gait Distance (Feet): 125 Feet Assistive device: Rolling walker (2 wheels) Gait Pattern/deviations: Step-through pattern, Decreased stride length, Trunk flexed Gait velocity: Decreased Gait velocity interpretation: <1.31 ft/sec, indicative of household ambulator   General Gait Details: Slow and guarded. VC's throughout for improved posture, closer walker proximity, and forward gaze. No physical assist required however hands on guarding provided throughout gait training.  Stairs            Wheelchair Mobility    Modified Rankin (Stroke Patients Only)       Balance Overall balance assessment: Mild deficits observed, not formally tested  Pertinent Vitals/Pain Pain Assessment Pain Assessment: Faces Faces Pain Scale: Hurts even more Pain Location: head, neck mostly Pain Descriptors / Indicators: Aching, Discomfort, Grimacing Pain Intervention(s):  Limited activity within patient's tolerance, Monitored during session, Repositioned    Home Living Family/patient expects to be discharged to:: Private residence Living Arrangements: Spouse/significant other Available Help at Discharge: Family;Available PRN/intermittently Type of Home: House Home Access: Level entry     Alternate Level Stairs-Number of Steps: full flight Home Layout: Two level;Able to live on main level with bedroom/bathroom Home Equipment: Toilet riser;Grab bars - toilet;Grab bars - tub/shower;Wheelchair - Publishing copy (2 wheels);Shower seat;BSC/3in1;Adaptive equipment;Hand held shower head Additional Comments: wife works however can check on her husband throughout the day    Prior Function Prior Level of Function : Independent/Modified Independent             Mobility Comments: Has used a wc for the past 2 years; has recently started using a rollator in the past month ADLs Comments: mod I however wife supervises shower transfers     Hand Dominance   Dominant Hand: Right    Extremity/Trunk Assessment   Upper Extremity Assessment Upper Extremity Assessment: Defer to OT evaluation    Lower Extremity Assessment Lower Extremity Assessment: Generalized weakness    Cervical / Trunk Assessment Cervical / Trunk Assessment: Back Surgery;Neck Surgery;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)  Communication   Communication: No difficulties  Cognition Arousal/Alertness: Awake/alert Behavior During Therapy: WFL for tasks assessed/performed Overall Cognitive Status: Within Functional Limits for tasks assessed                                          General Comments      Exercises     Assessment/Plan    PT Assessment Patient needs continued PT services  PT Problem List Decreased strength;Decreased range of motion;Decreased activity tolerance;Decreased balance;Decreased mobility;Decreased knowledge of use of DME;Decreased  safety awareness;Decreased knowledge of precautions;Pain       PT Treatment Interventions DME instruction;Gait training;Stair training;Functional mobility training;Therapeutic activities;Therapeutic exercise;Balance training;Patient/family education    PT Goals (Current goals can be found in the Care Plan section)  Acute Rehab PT Goals Patient Stated Goal: Home tomorrow, decrease pain PT Goal Formulation: With patient Time For Goal Achievement: 04/20/22 Potential to Achieve Goals: Good    Frequency Min 5X/week     Co-evaluation               AM-PAC PT "6 Clicks" Mobility  Outcome Measure Help needed turning from your back to your side while in a flat bed without using bedrails?: A Little Help needed moving from lying on your back to sitting on the side of a flat bed without using bedrails?: A Little Help needed moving to and from a bed to a chair (including a wheelchair)?: A Little Help needed standing up from a chair using your arms (e.g., wheelchair or bedside chair)?: A Little Help needed to walk in hospital room?: A Little Help needed climbing 3-5 steps with a railing? : A Little 6 Click Score: 18    End of Session Equipment Utilized During Treatment: Gait belt Activity Tolerance: Patient tolerated treatment well Patient left: in bed;with call bell/phone within reach;with family/visitor present Nurse Communication: Mobility status PT Visit Diagnosis: Unsteadiness on feet (R26.81);Pain Pain - part of body:  (neck/head/back)    Time: [id number] PT Time Calculation (min) (ACUTE ONLY):  20 min   Charges:   PT Evaluation $PT Eval Low Complexity: [place], PT, DPT Acute Rehabilitation Services Secure Chat Preferred Office: [phone]   Thelma Comp 04/13/2022, 1:14 PM

## 2022-04-13 NOTE — Progress Notes (Signed)
Subjective: The patient is alert and pleasant.  He looks well.  His wife is at the bedside.  He admits to a headache.  He had some urinary retention and Foley catheter was replaced.  He denies numbness.  Objective: Vital signs in last 24 hours: Temp:  [97.6 F (36.4 C)-99.6 F (37.6 C)] 99.4 F (37.4 C) (03/01 0720) Pulse Rate:  [66-103] 79 (03/01 0720) Resp:  [15-23] 20 (03/01 0720) BP: (117-157)/(72-107) 117/72 (03/01 0720) SpO2:  [91 %-97 %] 96 % (03/01 0720) Weight:  [116.5 kg] 116.5 kg (02/29 0944) Estimated body mass index is 36.85 kg/m as calculated from the following:   Height as of this encounter: '5\' 10"'$  (1.778 m).   Weight as of this encounter: 116.5 kg.   Intake/Output from previous day: 02/29 0701 - 03/01 0700 In: 3445 [P.O.:720; I.V.:1950; Blood:125; IV Piggyback:650] Out: 2700 [Urine:2300; Blood:400] Intake/Output this shift: No intake/output data recorded.  Physical exam the patient is alert and pleasant.  His lower extremity strength is normal.  There is no numbness.  His dressing has been reinforced.  There is no drainage.  Lab Results: No results for input(s): "WBC", "HGB", "HCT", "PLT" in the last 72 hours. BMET No results for input(s): "NA", "K", "CL", "CO2", "GLUCOSE", "BUN", "CREATININE", "CALCIUM" in the last 72 hours.  Studies/Results: DG Lumbar Spine 2-3 Views  Result Date: 04/12/2022 CLINICAL DATA:  Lumbar fusion, intraoperative examination EXAM: LUMBAR SPINE - 2-3 VIEW COMPARISON:  1:17 p.m. FINDINGS: Two fluoroscopic intraoperative radiographs presented for interpretation postprocedurally demonstrate surgical changes of anterior and posterior lumbar fusion with instrumentation at L4-5 with interbody bone cage placement and bilateral pedicle screws at each level. Soft tissue retractors are seen within the posterior soft tissues. Shrapnel is seen within the mid body of the sacrum. Disc space narrowing and endplate remodeling are noted at L3-4 and L5-S1.  Fluoroscopic time: 22 seconds Fluoroscopic dose: 35.49 mGy Fluoroscopic images: 2 IMPRESSION: 1. Anterior and posterior lumbar fusion procedure with instrumentation at L4-5. Electronically Signed   By: Fidela Salisbury M.D.   On: 04/12/2022 17:08   DG C-Arm 1-60 Min-No Report  Result Date: 04/12/2022 Fluoroscopy was utilized by the requesting physician.  No radiographic interpretation.   DG Lumbar Spine 2-3 Views  Result Date: 04/12/2022 CLINICAL DATA:  Intraoperative imaging for localization. EXAM: LUMBAR SPINE - 2-3 VIEW COMPARISON:  CT lumbar spine 03/27/2021. FINDINGS: Two views. Conventional numbering is assumed with 5 non-rib-bearing, lumbar type vertebral bodies. On the second image, the surgical probe is directed at the L4-5 interspace. IMPRESSION: On the second image, the surgical probe is directed at the L4-5 interspace. Electronically Signed   By: Emmit Alexanders M.D.   On: 04/12/2022 14:16    Assessment/Plan: Postop day #1: We will mobilize the patient with PT.  I think his headaches will resolve as we got a good dural closure.  Urinary retention: We will rest his bladder today and attempt to discontinue the catheter tomorrow.  He tells me he has seen a urologist in the past in [place].  If necessary he can be charged with his catheter and follow-up with his urologist.    LOS: 1 day     Ophelia Charter 04/13/2022, 7:47 AM     Patient ID: Nicholes Stairs, male   DOB: 1955-08-22, 67 y.o.   MRN: [id number]

## 2022-04-14 MED FILL — Thrombin For Soln 5000 Unit: CUTANEOUS | Qty: 5000 | Status: AC

## 2022-04-14 NOTE — Progress Notes (Signed)
Physical Therapy Treatment Patient Details Name: Darren Harrison MRN: [id number] DOB: 07-22-1955 Today's Date: 04/14/2022   History of Present Illness Pt is a 67 y/o male who presents s/p L4-5 PLIF on 04/12/2022. PMH includes: prior cervical surgeries and fusions (C4-6 ACDF), and lumbar surgery (L3-5), R elbow fx and surgery, R ulnnar nerve injury. B TKA.    PT Comments    Patient able to recall and adhere to 3/3 back precautions. Assist for sit to stand to stabilize RW as pt transitions hands from surface to RW. Better proximity to RW this date (no cues needed) with tactile cues for upright posture. Wife present throughout.     Recommendations for follow up therapy are one component of a multi-disciplinary discharge planning process, led by the attending physician.  Recommendations may be updated based on patient status, additional functional criteria and insurance authorization.  Follow Up Recommendations  Home health PT     Assistance Recommended at Discharge Intermittent Supervision/Assistance  Patient can return home with the following A little help with walking and/or transfers;A little help with bathing/dressing/bathroom;Assistance with cooking/housework;Assist for transportation;Help with stairs or ramp for entrance   Equipment Recommendations  None recommended by PT    Recommendations for Other Services       Precautions / Restrictions Precautions Precautions: Fall;Back Precaution Comments: pt able to verbalize and ahere to 3/3 precautions Required Braces or Orthoses: Spinal Brace Spinal Brace: Lumbar corset Restrictions Weight Bearing Restrictions: No     Mobility  Bed Mobility Overal bed mobility: Needs Assistance Bed Mobility: Rolling, Sit to Sidelying Rolling: Supervision Sidelying to sit: Supervision (heavy use of rails; has a bed rail if needed)       General bed mobility comments: no cues or assist needed; continued use of bed rail    Transfers Overall  transfer level: Needs assistance Equipment used: Rolling walker (2 wheels) Transfers: Sit to/from Stand Sit to Stand: Min assist, From elevated surface           General transfer comment: VC's for hand placement on seated surface for safety. Assist to achieve full stand and steady.    Ambulation/Gait Ambulation/Gait assistance: Min guard Gait Distance (Feet): 150 Feet Assistive device: Rolling walker (2 wheels) Gait Pattern/deviations: Step-through pattern, Decreased stride length, Trunk flexed Gait velocity: Decreased Gait velocity interpretation: <1.31 ft/sec, indicative of household ambulator   General Gait Details: Slow and guarded. VC's throughout for improved posture, closer walker proximity, and forward gaze. No physical assist required however hands on guarding provided throughout gait training.   Stairs             Wheelchair Mobility    Modified Rankin (Stroke Patients Only)       Balance Overall balance assessment: Mild deficits observed, not formally tested                                          Cognition Arousal/Alertness: Awake/alert Behavior During Therapy: WFL for tasks assessed/performed Overall Cognitive Status: Within Functional Limits for tasks assessed                                          Exercises      General Comments General comments (skin integrity, edema, etc.): Wife present throughout session      Pertinent Vitals/Pain  Pain Assessment Pain Assessment: 0-10 Pain Score: 2  Pain Location: head Pain Descriptors / Indicators: Headache Pain Intervention(s): Limited activity within patient's tolerance, Monitored during session    Home Living Family/patient expects to be discharged to:: Private residence Living Arrangements: Spouse/significant other Available Help at Discharge: Family;Available PRN/intermittently Type of Home: House Home Access: Level entry     Alternate Level  Stairs-Number of Steps: full flight Home Layout: Two level;Able to live on main level with bedroom/bathroom Home Equipment: Toilet riser;Grab bars - toilet;Grab bars - tub/shower;Wheelchair - Publishing copy (2 wheels);Shower seat;BSC/3in1;Adaptive equipment;Hand held shower head Additional Comments: wife works however can check on her husband throughout the day    Prior Function            PT Goals (current goals can now be found in the care plan section) Acute Rehab PT Goals Patient Stated Goal: Home tomorrow, decrease pain Time For Goal Achievement: 04/20/22 Potential to Achieve Goals: Good Progress towards PT goals: Progressing toward goals    Frequency    Min 5X/week      PT Plan Current plan remains appropriate    Co-evaluation              AM-PAC PT "6 Clicks" Mobility   Outcome Measure  Help needed turning from your back to your side while in a flat bed without using bedrails?: A Little Help needed moving from lying on your back to sitting on the side of a flat bed without using bedrails?: A Little Help needed moving to and from a bed to a chair (including a wheelchair)?: A Little Help needed standing up from a chair using your arms (e.g., wheelchair or bedside chair)?: A Little Help needed to walk in hospital room?: A Little Help needed climbing 3-5 steps with a railing? : A Little 6 Click Score: 18    End of Session Equipment Utilized During Treatment: Back brace Activity Tolerance: Patient tolerated treatment well Patient left: with call bell/phone within reach;with family/visitor present;Other (comment) (on toilet/3n1 in bathroom with call bell in reach) Nurse Communication: Mobility status PT Visit Diagnosis: Unsteadiness on feet (R26.81);Pain Pain - part of body:  (neck/head/back)     Time: [id number] PT Time Calculation (min) (ACUTE ONLY): 25 min  Charges:  $Gait Training: 23-37 mins                      [place]  Office [phone]    Rexanne Mano 04/14/2022, 11:33 AM

## 2022-04-14 NOTE — Progress Notes (Signed)
Occupational Therapy Treatment Patient Details Name: Darren Harrison MRN: [id number] DOB: 06/23/1955 Today's Date: 04/14/2022   History of present illness Pt is a 67 y/o male who presents s/p L4-5 PLIF on 04/12/2022. PMH includes: prior cervical surgeries and fusions (C4-6 ACDF), and lumbar surgery (L3-5), R elbow fx and surgery, R ulnnar nerve injury. B TKA.   OT comments  Pt progressing towards established OT goals. Providing education on use of AE for LB ADLs. Pt donning underwear with Min Guard A for safety in standing - no use of AE. Pt doffing/donning socks with AE demonstrating understanding. Reviewing back precautions, log roll, oral care techniques, and safe transfer techniques. Continue to recommend dc to home with HHOT and will continue to follow acutely as admitted.   Recommendations for follow up therapy are one component of a multi-disciplinary discharge planning process, led by the attending physician.  Recommendations may be updated based on patient status, additional functional criteria and insurance authorization.    Follow Up Recommendations  Home health OT     Assistance Recommended at Discharge Intermittent Supervision/Assistance (wife can initially provide 24/7)  Patient can return home with the following  A little help with walking and/or transfers;A little help with bathing/dressing/bathroom;Assistance with cooking/housework;Assist for transportation   Equipment Recommendations  None recommended by OT    Recommendations for Other Services      Precautions / Restrictions Precautions Precautions: Fall;Back Precaution Booklet Issued: Yes (comment) Precaution Comments: pt able to verbalize and adhere to 3/3 precautions Required Braces or Orthoses: Spinal Brace Spinal Brace: Lumbar corset Restrictions Weight Bearing Restrictions: No       Mobility Bed Mobility Overal bed mobility: Needs Assistance Bed Mobility: Rolling, Sit to Sidelying Rolling:  Supervision Sidelying to sit: Supervision (heavy use of rails; has a bed rail if needed)       General bed mobility comments: no cues or assist needed; continued use of bed rail    Transfers Overall transfer level: Needs assistance Equipment used: Rolling walker (2 wheels) Transfers: Sit to/from Stand Sit to Stand: Min guard, From elevated surface           General transfer comment: Min Guard A for safety. elevating EOB slightly to assist wiht power up     Balance Overall balance assessment: Mild deficits observed, not formally tested                                         ADL either performed or assessed with clinical judgement   ADL Overall ADL's : Needs assistance/impaired       Grooming Details (indicate cue type and reason): Reviewing using cups for spitting         Upper Body Dressing : Set up;Supervision/safety Upper Body Dressing Details (indicate cue type and reason): donning shirt and brace Lower Body Dressing: Min guard;Sit to/from stand Lower Body Dressing Details (indicate cue type and reason): Educating on use of AE for LB dressing. Pt donning underwear without AE; Min Guard A for standing balance. Pt demonstrating use of sock aide but reporting he would prefer his wife's help. Reviewing shoe horn use. Toilet Transfer: Ambulation;Min guard (simulated to recliner)           Functional mobility during ADLs: Min guard;Rolling walker (2 wheels) General ADL Comments: Pt continues to report pain - HA and back. Demonstrating decreased strength and balance.    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; generalized weakness but funcitonal - "has been this way for years"; hx of RTC insufficiency RUE Coordination: decreased fine motor LUE Deficits / Details: hx of shoulder "issues"; overall  functional; copmlaining of mild numbness in index finger which he states is new LUE Coordination: decreased gross motor   Lower Extremity Assessment Lower Extremity Assessment: Generalized weakness   Cervical / Trunk Assessment Cervical / Trunk Assessment: Back Surgery;Neck Surgery;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)    Vision       Perception     Praxis      Cognition Arousal/Alertness: Awake/alert Behavior During Therapy: WFL for tasks assessed/performed Overall Cognitive Status: Within Functional Limits for tasks assessed                                          Exercises      Shoulder Instructions       General Comments Wife present throughout    Pertinent Vitals/ Pain       Pain Assessment Pain Assessment: Faces Faces Pain Scale: Hurts little more Pain Location: head Pain Descriptors / Indicators: Headache Pain Intervention(s): Monitored during session, Limited activity within patient's tolerance, Repositioned  Home Living Family/patient expects to be discharged to:: Private residence Living Arrangements: Spouse/significant other Available Help at Discharge: Family;Available PRN/intermittently Type of Home: House Home Access: Level entry     Home Layout: Two level;Able to live on main level with bedroom/bathroom Alternate Level Stairs-Number of Steps: full flight   Bathroom Shower/Tub: Occupational psychologist: Standard Bathroom Accessibility: Yes   Home Equipment: Toilet riser;Grab bars - toilet;Grab bars - tub/shower;Wheelchair - Publishing copy (2 wheels);Shower seat;BSC/3in1;Adaptive equipment;Hand held shower head Adaptive Equipment: Reacher;Long-handled sponge Additional Comments: wife works however can check on her husband throughout the day      Prior Functioning/Environment              Frequency  Min 2X/week        Progress Toward Goals  OT Goals(current goals can now be found in the  care plan section)  Progress towards OT goals: Progressing toward goals  Acute Rehab OT Goals OT Goal Formulation: With patient Time For Goal Achievement: 04/27/22 Potential to Achieve Goals: Good ADL Goals Pt Will Perform Lower Body Bathing: with modified independence;with adaptive equipment;sit to/from stand Pt Will Perform Lower Body Dressing: with modified independence;with adaptive equipment;sit to/from stand Pt Will Transfer to Toilet: with modified independence;ambulating Pt Will Perform Toileting - Clothing Manipulation and hygiene: with modified independence;sit to/from stand;sitting/lateral leans;with adaptive equipment Additional ADL Goal #1: Pt will independently verbalize 3 back precautions Additional ADL Goal #2: Pt will independently verbalize 3 strategies to reduce risk of falls  Plan Discharge plan remains appropriate    Co-evaluation                 AM-PAC OT "6 Clicks" Daily Activity     Outcome Measure   Help from another person eating meals?: None Help from another person taking care of personal grooming?: A Little Help from another person toileting, which includes using toliet, bedpan, or urinal?: A Lot Help from another person bathing (including washing, rinsing, drying)?: A Lot Help from another person to put on and taking off regular upper body clothing?: A Lot Help from another person  to put on and taking off regular lower body clothing?: A Lot 6 Click Score: 15    End of Session Equipment Utilized During Treatment: Gait belt;Back brace  OT Visit Diagnosis: Unsteadiness on feet (R26.81);Other abnormalities of gait and mobility (R26.89);Muscle weakness (generalized) (M62.81);Pain Pain - part of body:  (neck; head)   Activity Tolerance Patient tolerated treatment well   Patient Left with call bell/phone within reach;with family/visitor present;in chair   Nurse Communication Mobility status        Time: 1410-1432 OT Time Calculation (min):  22 min  Charges: OT General Charges $OT Visit: 1 Visit OT Treatments $Self Care/Home Management : 8-22 mins  Aairah Negrette MSOT, OTR/L Acute Rehab Office: [place] 04/14/2022, 2:50 PM

## 2022-04-15 DIAGNOSIS — Z7901 Long term (current) use of anticoagulants: Secondary | ICD-10-CM | POA: Diagnosis not present

## 2022-04-15 DIAGNOSIS — E039 Hypothyroidism, unspecified: Secondary | ICD-10-CM | POA: Diagnosis present

## 2022-04-15 DIAGNOSIS — R519 Headache, unspecified: Secondary | ICD-10-CM | POA: Diagnosis not present

## 2022-04-15 DIAGNOSIS — K7460 Unspecified cirrhosis of liver: Secondary | ICD-10-CM | POA: Diagnosis present

## 2022-04-15 DIAGNOSIS — E0500 Thyrotoxicosis with diffuse goiter without thyrotoxic crisis or storm: Secondary | ICD-10-CM | POA: Diagnosis present

## 2022-04-15 DIAGNOSIS — M4316 Spondylolisthesis, lumbar region: Secondary | ICD-10-CM | POA: Diagnosis present

## 2022-04-15 DIAGNOSIS — I10 Essential (primary) hypertension: Secondary | ICD-10-CM | POA: Diagnosis present

## 2022-04-15 DIAGNOSIS — Z79890 Hormone replacement therapy (postmenopausal): Secondary | ICD-10-CM | POA: Diagnosis not present

## 2022-04-15 DIAGNOSIS — Z8349 Family history of other endocrine, nutritional and metabolic diseases: Secondary | ICD-10-CM | POA: Diagnosis not present

## 2022-04-15 DIAGNOSIS — Z981 Arthrodesis status: Secondary | ICD-10-CM | POA: Diagnosis not present

## 2022-04-15 DIAGNOSIS — N401 Enlarged prostate with lower urinary tract symptoms: Secondary | ICD-10-CM | POA: Diagnosis present

## 2022-04-15 DIAGNOSIS — R338 Other retention of urine: Secondary | ICD-10-CM | POA: Diagnosis present

## 2022-04-15 DIAGNOSIS — Z833 Family history of diabetes mellitus: Secondary | ICD-10-CM | POA: Diagnosis not present

## 2022-04-15 DIAGNOSIS — Z8249 Family history of ischemic heart disease and other diseases of the circulatory system: Secondary | ICD-10-CM | POA: Diagnosis not present

## 2022-04-15 DIAGNOSIS — K219 Gastro-esophageal reflux disease without esophagitis: Secondary | ICD-10-CM | POA: Diagnosis present

## 2022-04-15 DIAGNOSIS — Z79899 Other long term (current) drug therapy: Secondary | ICD-10-CM | POA: Diagnosis not present

## 2022-04-15 DIAGNOSIS — M5136 Other intervertebral disc degeneration, lumbar region: Secondary | ICD-10-CM | POA: Diagnosis present

## 2022-04-15 DIAGNOSIS — Z96653 Presence of artificial knee joint, bilateral: Secondary | ICD-10-CM | POA: Diagnosis present

## 2022-04-15 DIAGNOSIS — Z87891 Personal history of nicotine dependence: Secondary | ICD-10-CM | POA: Diagnosis not present

## 2022-04-15 DIAGNOSIS — M5116 Intervertebral disc disorders with radiculopathy, lumbar region: Secondary | ICD-10-CM | POA: Diagnosis present

## 2022-04-15 DIAGNOSIS — M48062 Spinal stenosis, lumbar region with neurogenic claudication: Secondary | ICD-10-CM | POA: Diagnosis present

## 2022-04-15 MED ORDER — OXYCODONE HCL 10 MG PO TABS: 10.0000 mg | ORAL_TABLET | ORAL | 0 refills | Status: AC | PRN

## 2022-04-15 MED ORDER — RIVAROXABAN 20 MG PO TABS: ORAL_TABLET | ORAL | 3 refills | Status: DC

## 2022-04-15 NOTE — Progress Notes (Signed)
PT Cancellation Note  Patient Details Name: Darren Harrison MRN: [id number] DOB: 10-Jul-1955   Cancelled Treatment:    Reason Eval/Treat Not Completed: Other (comment)  Patient just returning to bed after ambulating with wife in hallway. Pt/wife denied any questions for PT and state, from a mobility perspective, that pt is ready to go home. Patient has yet to be able to void.   [place]  Office [phone]  Rexanne Mano 04/15/2022, 9:29 AM

## 2022-04-15 NOTE — Discharge Summary (Addendum)
Physician Discharge Summary  Patient ID: TAG WOITAS MRN: [id number] DOB/AGE: 1955-04-24 67 y.o.  Admit date: 04/12/2022 Discharge date: 04/15/2022  Admission Diagnoses:  Lumbar spondylolisthesis  Discharge Diagnoses:  Same Principal Problem:   Spondylolisthesis of lumbar region Active Problems:   S/P lumbar fusion   Discharged Condition: Stable  Hospital Course:  Darren Harrison is a 67 y.o. male admitted after lumbar fusion. He had improving headache possibly related to durotomy. He was seen by PT/OT and felt safe for d/c home with home health services. He had postop urinary retention which he has had previously after surgery requiring re-insertion of a Foley catheter. He was discharged in stable condition.  Treatments: Surgery - Lumbar fusion  Discharge Exam: Blood pressure 136/76, pulse 65, temperature 98.4 F (36.9 C), resp. rate 17, height '5\' 10"'$  (1.778 m), weight 116.5 kg, SpO2 98 %. Awake, alert, oriented Speech fluent, appropriate CN grossly intact 5/5 BUE/BLE Wound c/d/i  Disposition: Discharge disposition: 01-Home or Self Care       Discharge Instructions     Call MD for:  redness, tenderness, or signs of infection (pain, swelling, redness, odor or green/yellow discharge around incision site)   Complete by: As directed    Call MD for:  temperature >100.4   Complete by: As directed    Diet - low sodium heart healthy   Complete by: As directed    Discharge instructions   Complete by: As directed    Walk at home as much as possible, at least 4 times / day   Increase activity slowly   Complete by: As directed    Lifting restrictions   Complete by: As directed    No lifting > 10 lbs   May shower / Bathe   Complete by: As directed    48 hours after surgery   May walk up steps   Complete by: As directed    Other Restrictions   Complete by: As directed    No bending/twisting at waist   Remove dressing in 24 hours   Complete by: As directed        Allergies as of 04/15/2022   No Known Allergies      Medication List     TAKE these medications    acetaminophen 325 MG tablet Commonly known as: TYLENOL Take 2 tablets (650 mg total) by mouth every 4 (four) hours as needed for mild pain ((score 1 to 3) or temp > 100.5).   AMINO ACID PO Take 1 capsule by mouth 3 (three) times daily.   Baclofen 5 MG Tabs TAKE 3 TABLETS BY MOUTH 3 TIMES A DAY   calcium carbonate 500 MG chewable tablet Commonly known as: TUMS - dosed in mg elemental calcium Chew 1 tablet by mouth as needed for indigestion or heartburn.   CENTRUM ADULTS PO Take 1 tablet by mouth daily.   cyclobenzaprine 10 MG tablet Commonly known as: FLEXERIL TAKE 1 TABLET BY MOUTH EVERYDAY AT BEDTIME   diclofenac Sodium 1 % Gel Commonly known as: VOLTAREN Apply 1 Application topically daily as needed (joint pain).   levothyroxine 175 MCG tablet Commonly known as: SYNTHROID Take 1 tablet (175 mcg total) by mouth daily before breakfast.   metoprolol tartrate 25 MG tablet Commonly known as: LOPRESSOR Take 25 mg by mouth 2 (two) times daily.   Oxycodone HCl 10 MG Tabs Take 1 tablet (10 mg total) by mouth every 4 (four) hours as needed for up to 7 days for severe pain ((  score 7 to 10)).   pantoprazole 40 MG tablet Commonly known as: Protonix Take 1 tablet (40 mg total) by mouth 2 (two) times daily.   polyethylene glycol 17 g packet Commonly known as: MIRALAX / GLYCOLAX Take 17 g by mouth daily. What changed:  when to take this reasons to take this   rivaroxaban 20 MG Tabs tablet Commonly known as: Xarelto TAKE 1 TABLET BY MOUTH EVERY DAY WITH SUPPER Start taking on: April 19, 2022 What changed: These instructions start on April 19, 2022. If you are unsure what to do until then, ask your doctor or other care provider.   tamsulosin 0.4 MG Caps capsule Commonly known as: FLOMAX TAKE 2 CAPSULES BY MOUTH DAILY AFTER SUPPER.   Zinc 50 MG Tabs Take 50 mg by  mouth daily.        [facility] Follow up.   Why: [place] agency will be providing home health services.  They will call you within 24-48 hours to set up therapy services in the home. Contact information: [place] [address] [phone]         Newman Pies, MD Follow up.   Specialty: Neurosurgery Contact information: [address] [place] [address] [phone]         Established Urologist. Call in 1 week(s).                  SignedJairo Ben 04/15/2022, 10:05 AM

## 2022-04-15 NOTE — Progress Notes (Signed)
Patient is ready for discharge at this time IV is removed and 16 fr Foley is in place at this time. He will be following up with urology in the next week.

## 2022-04-15 NOTE — Plan of Care (Signed)

## 2022-04-17 NOTE — Progress Notes (Signed)
Juanetta Snow, RNCM with [facility] health called requesting Discharge Summary and H&P for patient's recent admission.  The patient was set up for home health services prior to his discharge to home with patient's permission - clinicals were faxed to [phone].

## 2022-04-18 ENCOUNTER — Encounter: Payer: Self-pay | Admitting: Internal Medicine

## 2022-04-20 MED FILL — Heparin Sodium (Porcine) Inj 1000 Unit/ML: INTRAMUSCULAR | Qty: 30 | Status: AC

## 2022-04-20 MED FILL — Sodium Chloride IV Soln 0.9%: INTRAVENOUS | Qty: 2000 | Status: AC

## 2022-04-27 ENCOUNTER — Other Ambulatory Visit: Payer: Self-pay | Admitting: Physical Medicine and Rehabilitation

## 2022-04-27 ENCOUNTER — Encounter: Payer: Medicare Other | Admitting: Physical Medicine and Rehabilitation

## 2022-06-18 IMAGING — MR MR ABDOMEN WO/W CM
20 series · 48 of 48 positions shown · IV contrast (gadavist)
Comparison: Right upper quadrant ultrasound dated 02/17/2020

CLINICAL DATA: Cirrhosis, possible liver lesion on ultrasound X

EXAM:
MRI ABDOMEN WITHOUT AND WITH CONTRAST
TECHNIQUE: Multiplanar multisequence MR imaging of the abdomen was performed
both before and after the administration of intravenous contrast.
CONTRAST:  7mL GADAVIST GADOBUTROL 1 MMOL/ML IV SOLN

[Series 3: cor haste · coronal · 6.0mm · 1.25mm/px · 2 of 42 slices shown]
[im 1/42]
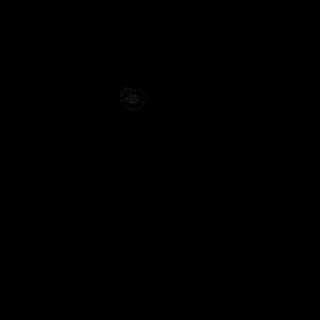
[im 42/42]
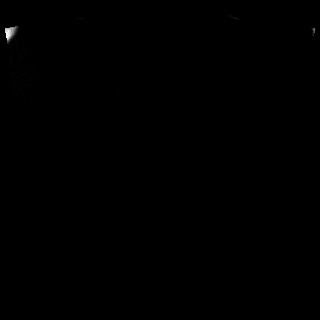

[Series 4: ax haste · axial · 6.0mm · 1.38mm/px · z∈[-134,+205]mm · 2 of 48 slices shown]
[im 1/48]
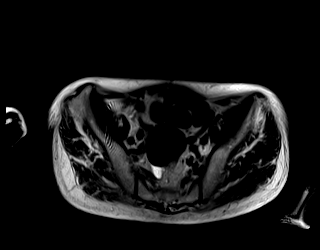
[im 48/48]
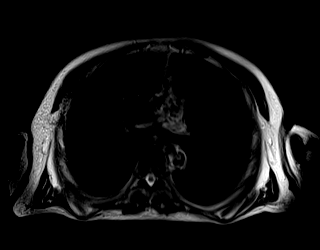

[Series 5: T2 fat-sat · axial · 6.0mm · 1.19mm/px · 1 of 34 slices shown]
[im 1/34]
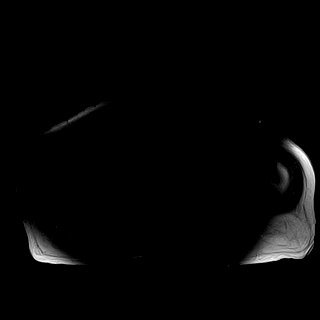

[Series 6: DWI · axial · 6.0mm · 1.64mm/px · 1 of 40 slices shown (1 of 4)]
[im 1/40]
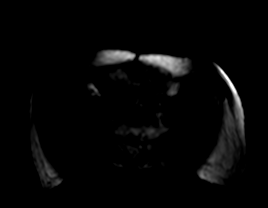

[Series 6: DWI · axial · 6.0mm · 1.64mm/px · 1 of 40 slices shown (2 of 4)]
[im 1/40]
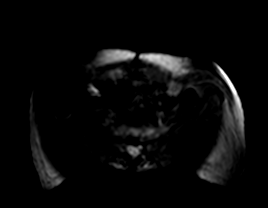

[Series 6: DWI · axial · 6.0mm · 1.64mm/px · 1 of 40 slices shown (3 of 4)]
[im 1/40]
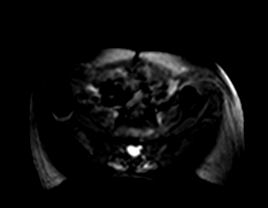

[Series 7: DWI · axial · 6.0mm · 1.64mm/px · 1 of 40 slices shown (4 of 4)]
[im 1/40]
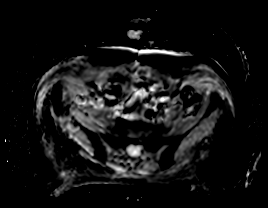

[Series 8: bSSFP · axial · 6.0mm · 0.86mm/px · z∈[-68,+172]mm · 2 of 41 slices shown]
[im 1/41]
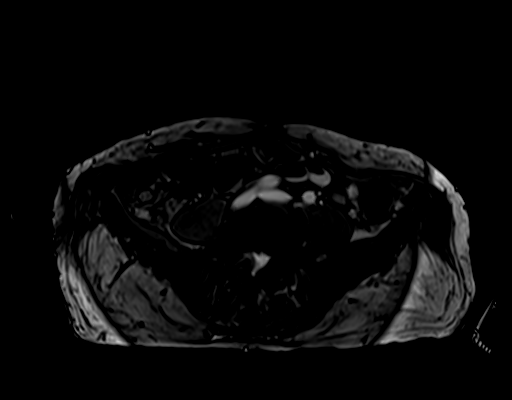
[im 41/41]
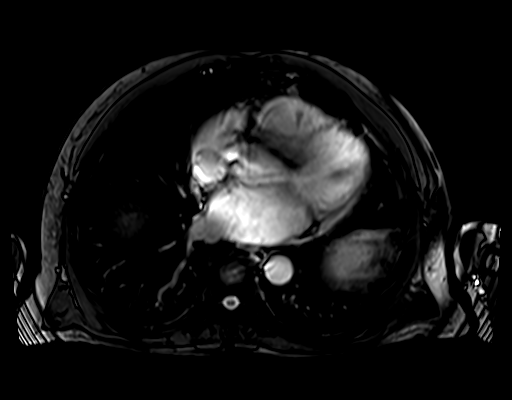

[Series 9: ax in and · axial · 3.0mm · 1.19mm/px · z∈[-52,+185]mm · 3 of 80 slices shown (1 of 2)]
[im 1/80]
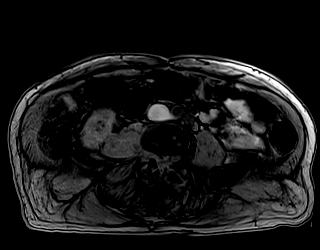
[im 40/80]
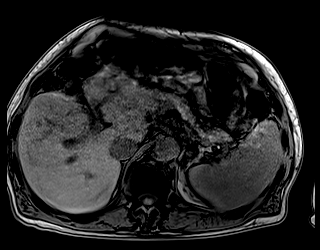
[im 80/80]
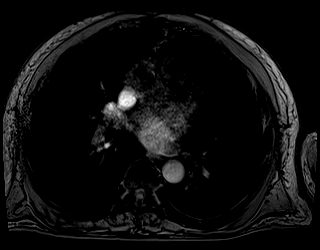

[Series 10: ax in and · axial · 3.0mm · 1.19mm/px · z∈[-52,+185]mm · 3 of 80 slices shown (2 of 2)]
[im 1/80]
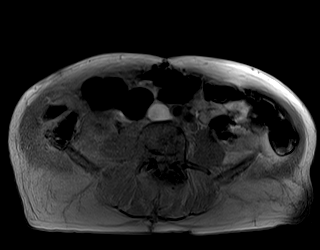
[im 40/80]
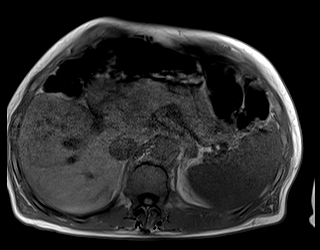
[im 80/80]
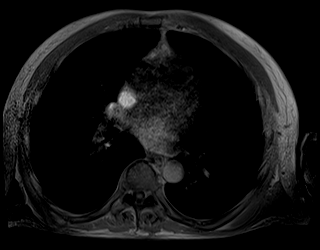

[Series 11: T1 dynamic · axial · non-contrast · 3.0mm · 1.19mm/px · z∈[-53,+184]mm · 3 of 80 slices shown (1 of 4)]
[im 1/80]
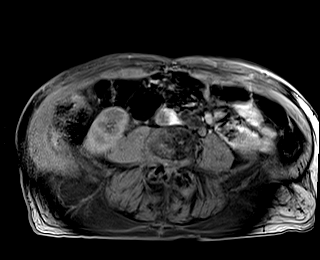
[im 40/80]
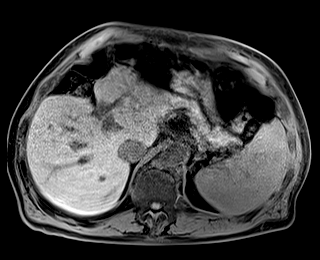
[im 80/80]
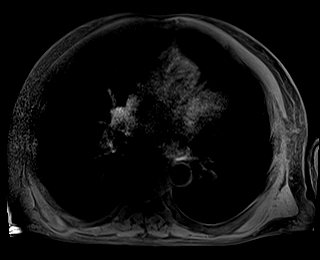

[Series 13: T1 dynamic post-contrast · axial · 3.0mm · 1.19mm/px · z∈[-53,+184]mm · 3 of 80 slices shown (1 of 6)]
[im 1/80]
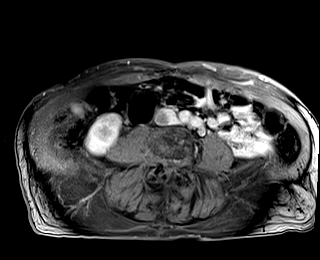
[im 40/80]
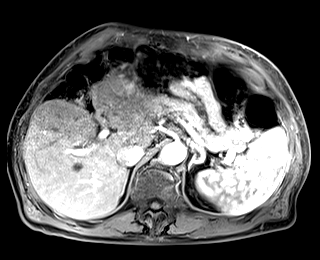
[im 80/80]
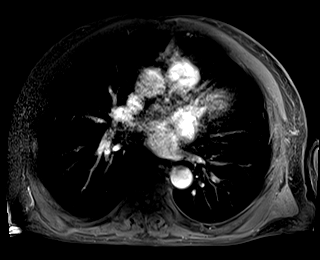

[Series 14: T1 dynamic · axial · 3.0mm · 1.19mm/px · z∈[-53,+184]mm · 3 of 80 slices shown (2 of 4)]
[im 1/80]
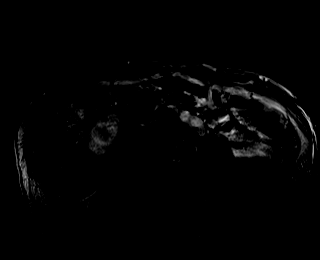
[im 40/80]
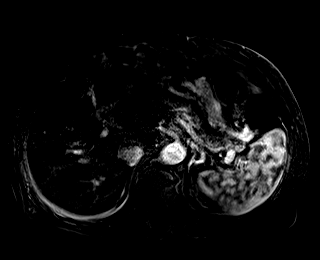
[im 80/80]
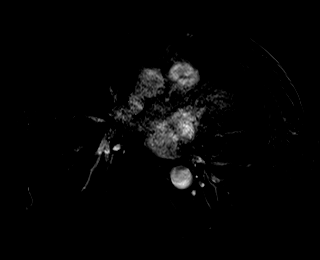

[Series 15: T1 dynamic post-contrast · axial · 3.0mm · 1.19mm/px · z∈[-53,+184]mm · 3 of 80 slices shown (2 of 6)]
[im 1/80]
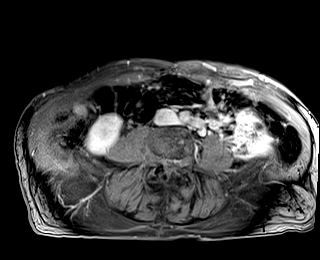
[im 40/80]
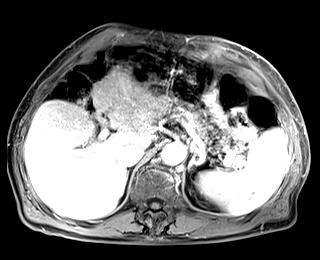
[im 80/80]
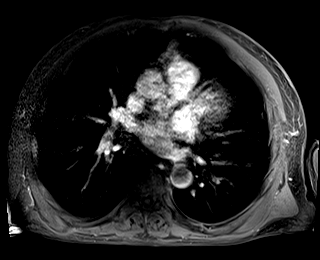

[Series 16: T1 dynamic · axial · 3.0mm · 1.19mm/px · z∈[-53,+184]mm · 3 of 80 slices shown (3 of 4)]
[im 1/80]
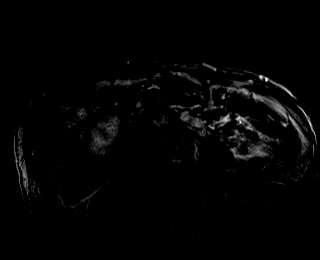
[im 40/80]
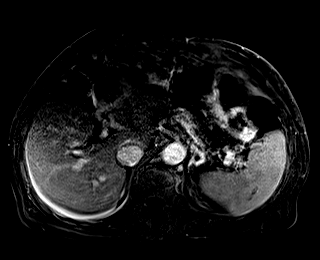
[im 80/80]
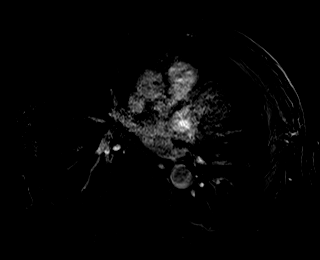

[Series 17: T1 dynamic post-contrast · axial · 3.0mm · 1.19mm/px · z∈[-53,+184]mm · 3 of 80 slices shown (3 of 6)]
[im 1/80]
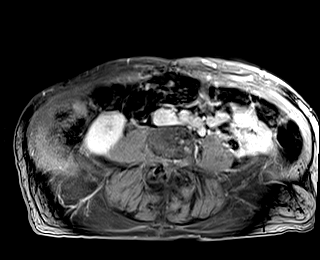
[im 40/80]
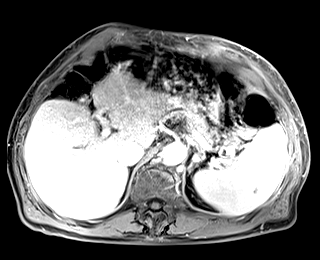
[im 80/80]
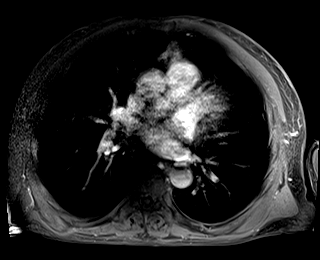

[Series 18: T1 dynamic · axial · 3.0mm · 1.19mm/px · z∈[-53,+184]mm · 3 of 80 slices shown (4 of 4)]
[im 1/80]
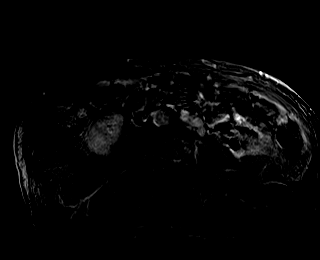
[im 40/80]
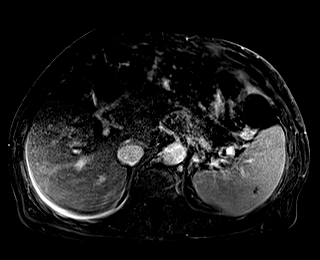
[im 80/80]
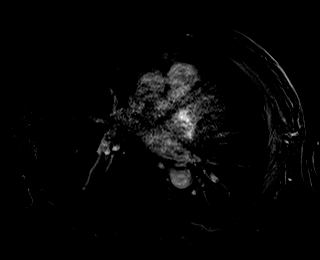

[Series 19: T1 dynamic post-contrast · coronal · 3.0mm · 1.19mm/px · 4 of 96 slices shown (4 of 6)]
[im 1/96]
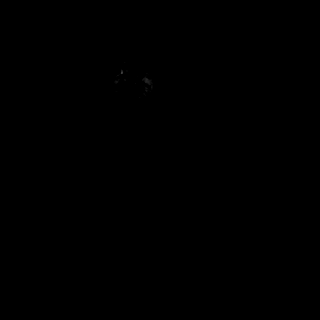
[im 32/96]
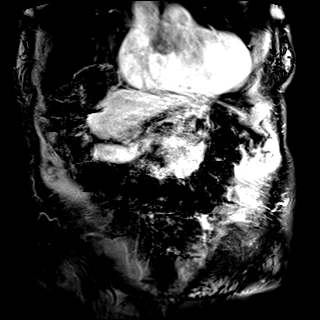
[im 64/96]
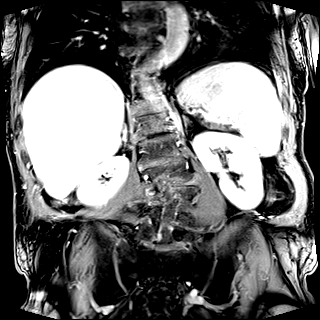
[im 96/96]
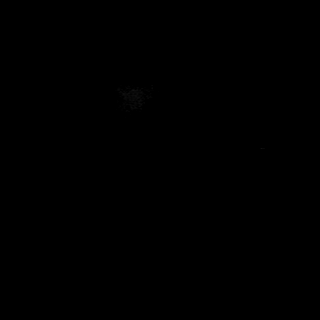

[Series 20: T1 dynamic post-contrast · axial · 3.0mm · 1.19mm/px · z∈[-53,+184]mm · 3 of 80 slices shown (5 of 6)]
[im 1/80]
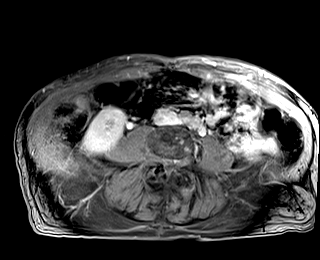
[im 40/80]
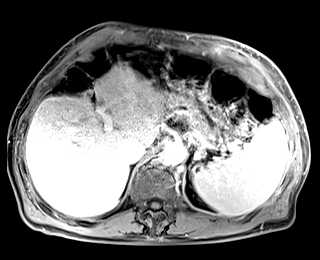
[im 80/80]
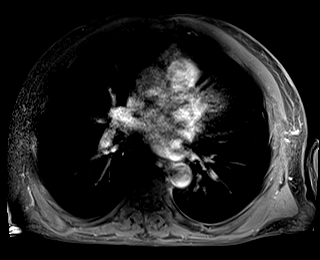

[Series 21: T1 dynamic post-contrast · axial · 3.0mm · 1.19mm/px · z∈[-53,+184]mm · 3 of 80 slices shown (6 of 6)]
[im 1/80]
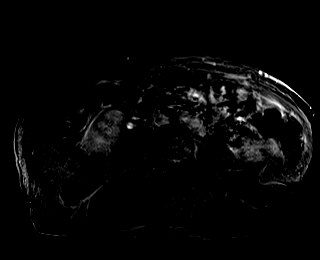
[im 40/80]
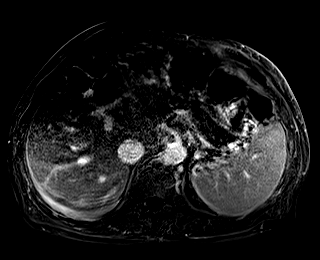
[im 80/80]
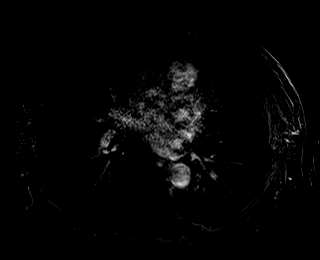

[48 of 48 positions shown; findings below may reference images not displayed]

FINDINGS: Motion degraded images.

Lower chest: Lung bases are clear.

Hepatobiliary: Mildly nodular hepatic contour with enlargement of
the caudate, suggesting early cirrhosis. No suspicious/enhancing
hepatic lesion is seen, noting motion degradation. No hepatic
steatosis.

Gallbladder is unremarkable. No intrahepatic or extrahepatic ductal
dilatation.

Pancreas: 6 mm unilocular cyst in the pancreatic head (series
4/image 24), nonenhancing, likely benign. No pancreatic atrophy or
ductal dilatation

Spleen:  At the upper limits of normal for size.

Adrenals/Urinary Tract:  Adrenal glands are within normal limits.

Kidneys are within normal limits.  No hydronephrosis.

Stomach/Bowel: Stomach is within normal limits.

Visualized bowel is grossly unremarkable.

Vascular/Lymphatic:  No evidence of abdominal aortic aneurysm.

Suspected mild perigastric varices.  Portal vein is patent.

No suspicious abdominal lymphadenopathy.

Other:  No abdominal ascites.

Musculoskeletal: Lumbar levoscoliosis with degenerative changes.
IMPRESSION: Cirrhosis. No focal hepatic lesion is evident MRI, noting motion
degradation.

Suspected mild perigastric varices. Portal vein is patent. No
abdominal ascites. Spleen is at the upper limits of normal for size.

6 mm unilocular cyst in the pancreatic head, likely reflecting a
benign pseudocyst or side branch IPMN. Given small size, follow-up
CT or MRI abdomen with/without contrast in 2 years is considered
optional.

## 2022-07-06 ENCOUNTER — Ambulatory Visit: Payer: Medicare Other | Admitting: Physical Medicine and Rehabilitation

## 2022-07-16 ENCOUNTER — Ambulatory Visit: Payer: Medicare Other | Admitting: Physical Medicine and Rehabilitation

## 2022-08-10 ENCOUNTER — Encounter: Payer: Self-pay | Admitting: Physical Medicine and Rehabilitation

## 2022-08-10 ENCOUNTER — Encounter
Payer: Medicare Other | Attending: Physical Medicine and Rehabilitation | Admitting: Physical Medicine and Rehabilitation

## 2022-08-10 VITALS — BP 121/76 | HR 63 | Ht 70.0 in | Wt 262.0 lb

## 2022-08-10 DIAGNOSIS — Z96652 Presence of left artificial knee joint: Secondary | ICD-10-CM | POA: Diagnosis present

## 2022-08-10 DIAGNOSIS — G959 Disease of spinal cord, unspecified: Secondary | ICD-10-CM | POA: Diagnosis present

## 2022-08-10 DIAGNOSIS — Z96651 Presence of right artificial knee joint: Secondary | ICD-10-CM | POA: Insufficient documentation

## 2022-08-10 DIAGNOSIS — M4802 Spinal stenosis, cervical region: Secondary | ICD-10-CM | POA: Insufficient documentation

## 2022-08-10 DIAGNOSIS — R252 Cramp and spasm: Secondary | ICD-10-CM | POA: Diagnosis present

## 2022-08-10 DIAGNOSIS — Z981 Arthrodesis status: Secondary | ICD-10-CM | POA: Diagnosis present

## 2022-08-10 DIAGNOSIS — G992 Myelopathy in diseases classified elsewhere: Secondary | ICD-10-CM | POA: Diagnosis present
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
## 2022-08-10 NOTE — Patient Instructions (Signed)
Patient is a 67 yr old male with incomplete C4 tetraplegia almost ASIA E-  5 muscles less than 5/5- and neurogenic  bladder not neurogenic bowel - and very mild spasticity as well as Grave's disease which has been treated adequately. Also has has BPH and  had Afib due to Graves disease- stopped blood thinner himself.    Just had R TKR 5/23 and L TKR 9/23 and L4/5 decompression with lami -redo 3/24- ; and also has new dx of cirrhosis of liver.  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   Will talk to  Cards about blood thinner, but mentioned to pt that off blood thinner, if goes into Afib, at high risk for a stroke.  2. Hint of spasticity left- but doesn't need meds for it at this point.   3. Doing so well, can have PCP follow, since I'm not prescribing any meds anymore.   4. Still slightly weak, and cannot tell-   5. At home, work on getting from a chair  or toilet- do a few repetitions at a time- without hands- as well as standing on heels and toes.   6. Until your strength is 5/5, you will get fatigued earlier- especially because they are trying to keep up with muscles that are 5/5- also you are pushing.   7. Call me to let me know if you get up walking longer distances without an assistive device.    8. F/U as needed

## 2022-08-22 ENCOUNTER — Other Ambulatory Visit: Payer: Self-pay | Admitting: Physical Medicine and Rehabilitation

## 2022-08-28 ENCOUNTER — Other Ambulatory Visit (HOSPITAL_COMMUNITY): Payer: Self-pay | Admitting: Nurse Practitioner

## 2022-08-28 DIAGNOSIS — K7460 Unspecified cirrhosis of liver: Secondary | ICD-10-CM

## 2022-09-07 ENCOUNTER — Ambulatory Visit (HOSPITAL_COMMUNITY): Payer: Medicare Other

## 2022-09-12 IMAGING — DX DG ABDOMEN 1V
1 series · 2 of 2 positions shown · non-contrast
Comparison: None.

CLINICAL DATA: Nausea/vomiting, cervical fusion

EXAM:
ABDOMEN - 1 VIEW

[Series 1: abdomen · 0.14mm/px · 2 of 2 slices shown]
[im 1/2]
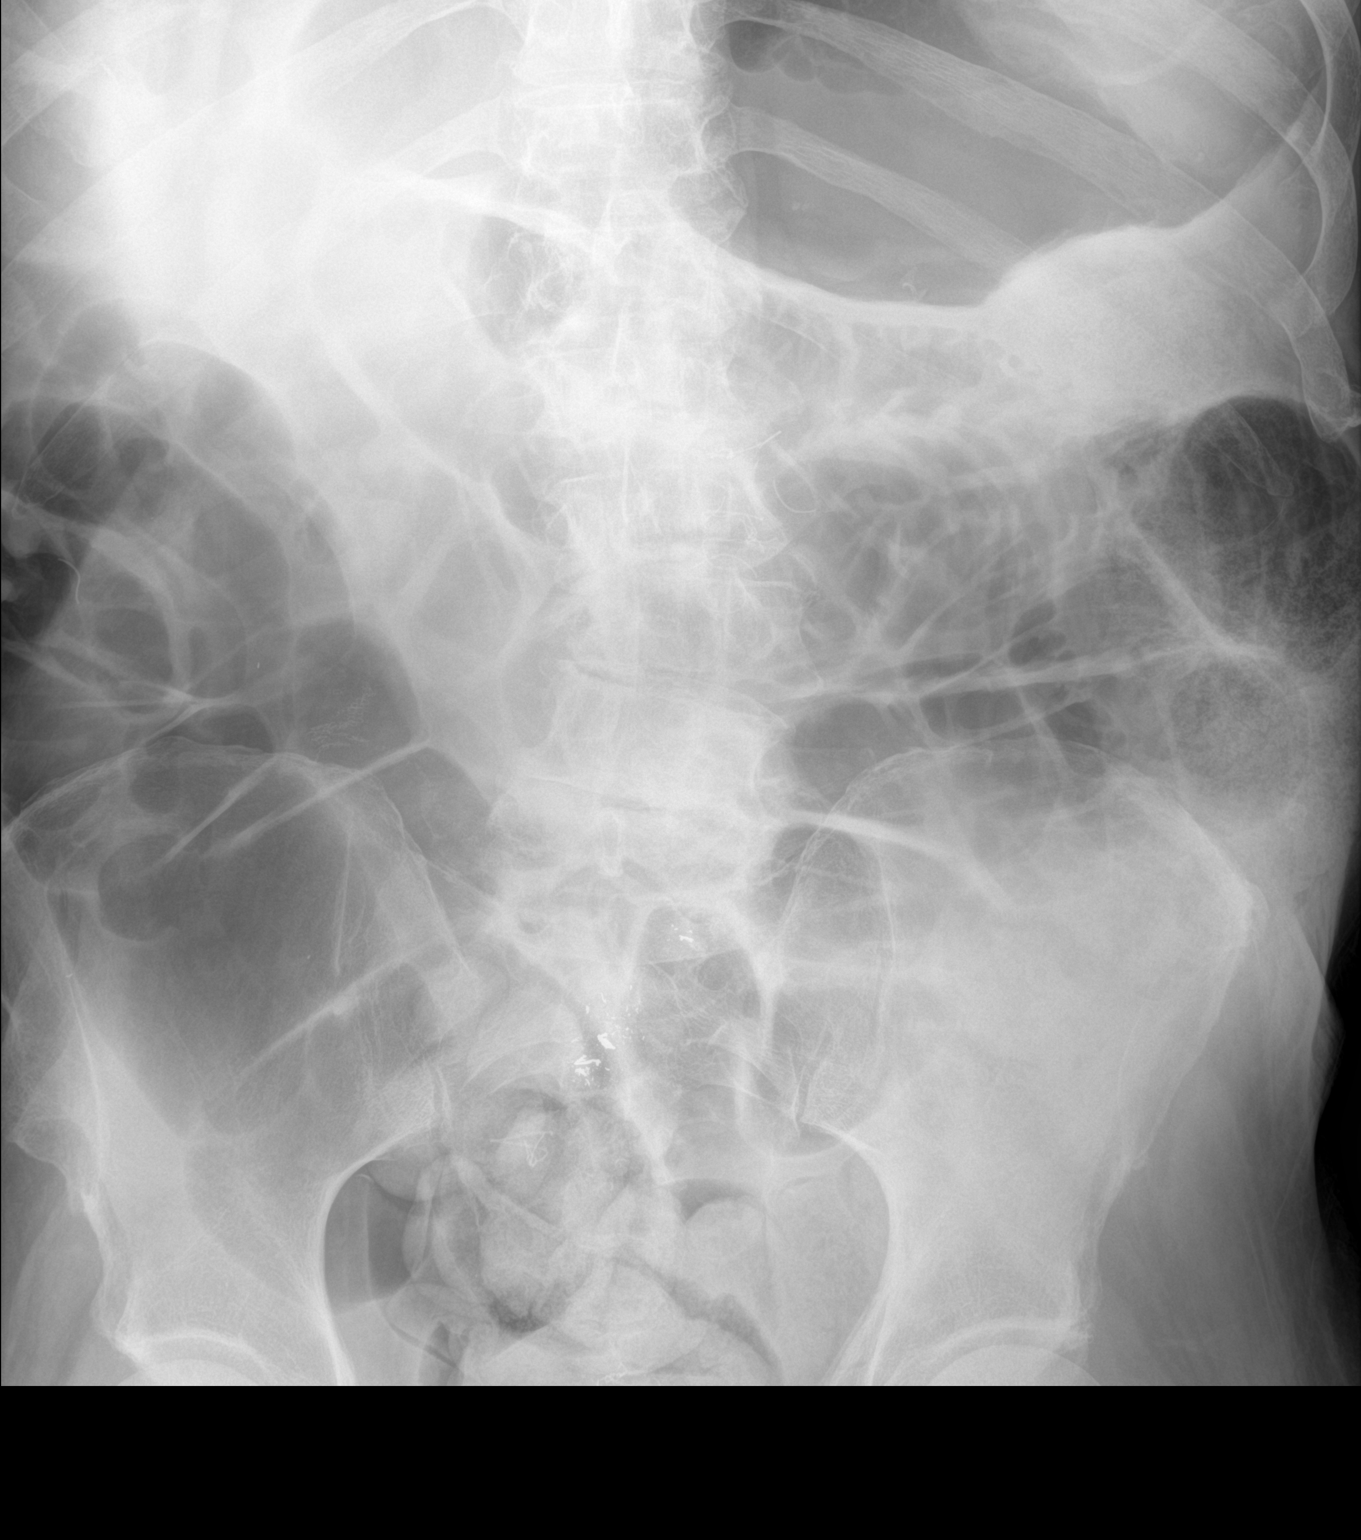
[im 2/2]
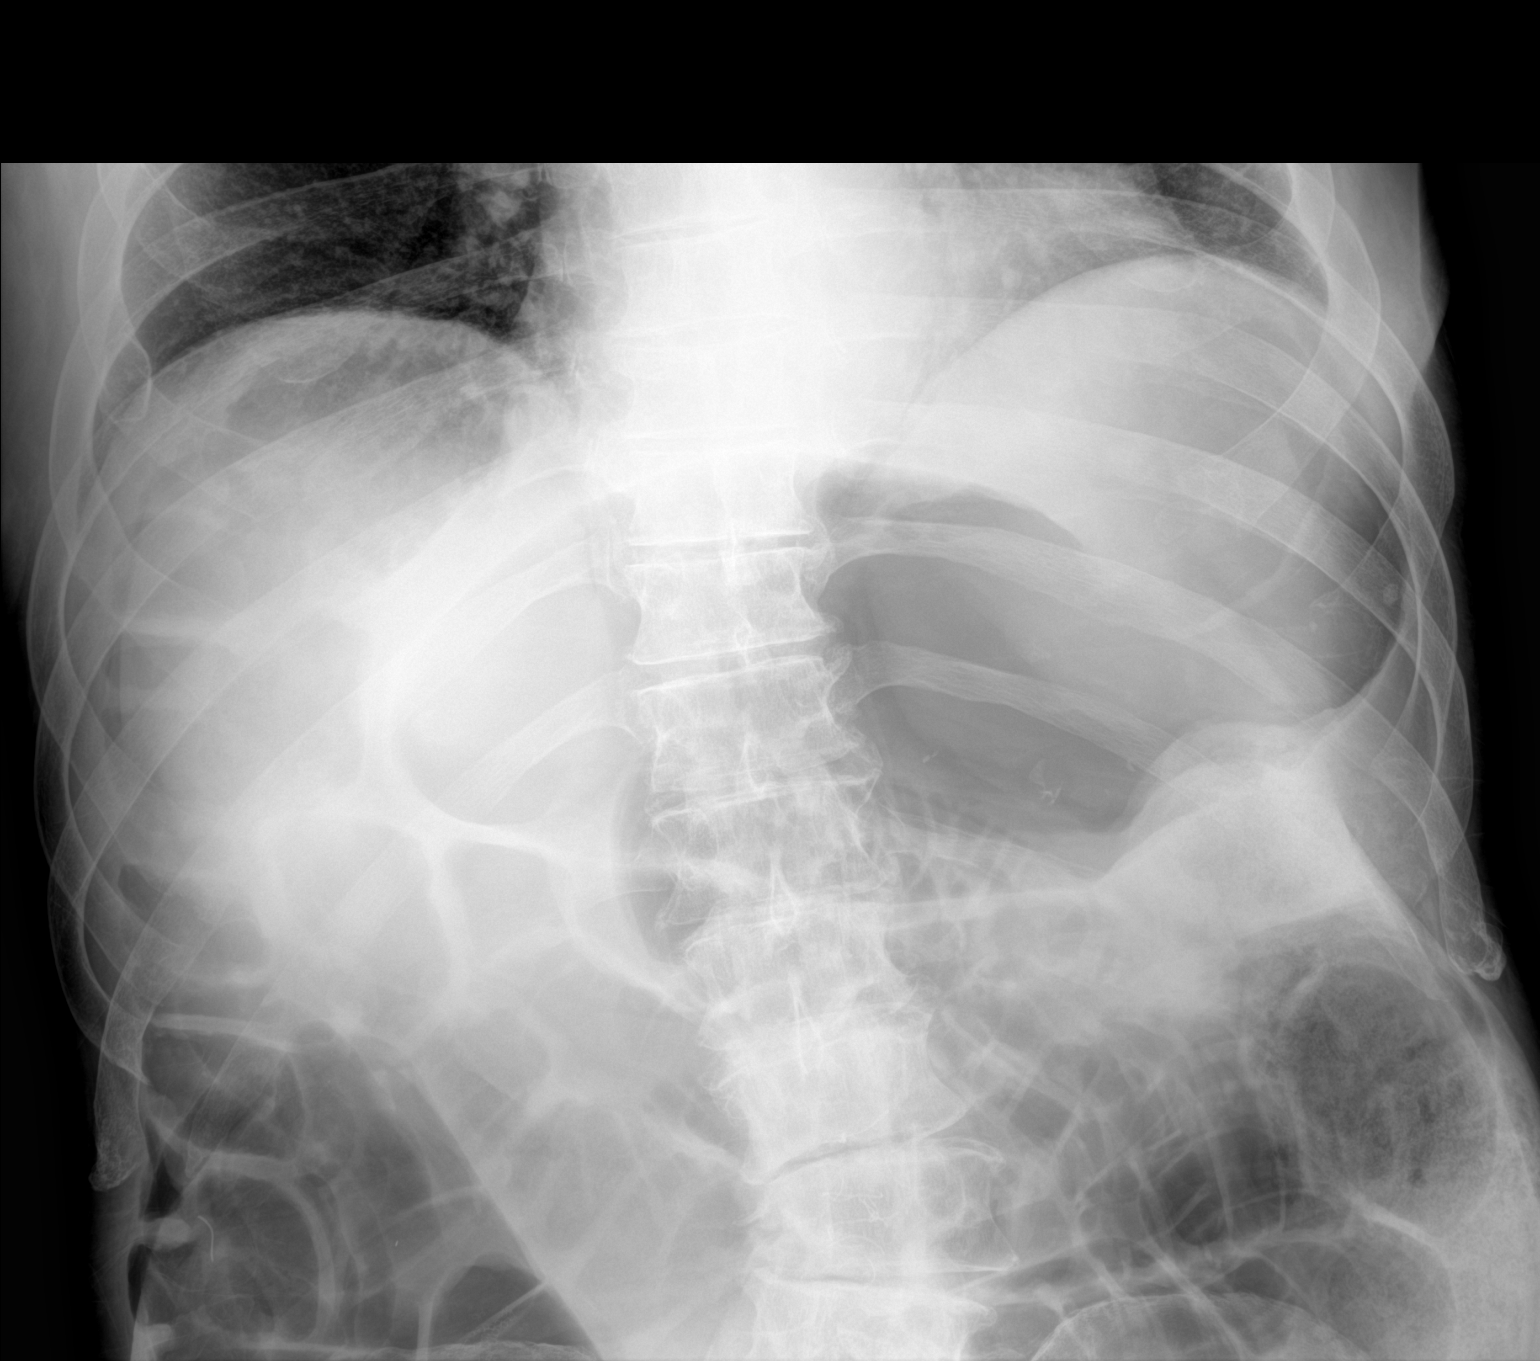

[2 of 2 positions shown; findings below may reference images not displayed]

FINDINGS: Mildly dilated loops of small bowel. Mildly dilated right colon.
Stool in the rectum. This appearance favors adynamic/postoperative
ileus.
IMPRESSION: Suspected adynamic/postoperative ileus.

## 2022-09-14 ENCOUNTER — Ambulatory Visit (HOSPITAL_COMMUNITY)
Admission: RE | Admit: 2022-09-14 | Discharge: 2022-09-14 | Disposition: A | Discharge status: Home / Self Care | Payer: Medicare Other | Source: Ambulatory Visit | Attending: Nurse Practitioner | Admitting: Nurse Practitioner

## 2022-09-14 ENCOUNTER — Other Ambulatory Visit: Payer: Self-pay | Admitting: "Endocrinology

## 2022-09-14 DIAGNOSIS — K7581 Nonalcoholic steatohepatitis (NASH): Secondary | ICD-10-CM | POA: Diagnosis not present

## 2022-09-14 DIAGNOSIS — K7460 Unspecified cirrhosis of liver: Secondary | ICD-10-CM | POA: Diagnosis present

## 2022-09-19 ENCOUNTER — Encounter: Payer: Self-pay | Admitting: "Endocrinology

## 2022-09-19 ENCOUNTER — Ambulatory Visit (INDEPENDENT_AMBULATORY_CARE_PROVIDER_SITE_OTHER): Payer: Medicare Other | Admitting: "Endocrinology

## 2022-09-19 VITALS — BP 130/84 | HR 52 | Ht 70.0 in | Wt 260.0 lb

## 2022-09-19 DIAGNOSIS — E890 Postprocedural hypothyroidism: Secondary | ICD-10-CM | POA: Diagnosis not present

## 2022-09-19 MED ORDER — LEVOTHYROXINE SODIUM 175 MCG PO TABS: 175.0000 ug | ORAL_TABLET | Freq: Every day | ORAL | 1 refills | Status: DC
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
## 2023-02-08 ENCOUNTER — Other Ambulatory Visit: Payer: Self-pay

## 2023-02-08 DIAGNOSIS — E890 Postprocedural hypothyroidism: Secondary | ICD-10-CM

## 2023-02-08 MED ORDER — LEVOTHYROXINE SODIUM 175 MCG PO TABS: 175.0000 ug | ORAL_TABLET | Freq: Every day | ORAL | 0 refills | Status: DC

## 2023-02-22 ENCOUNTER — Other Ambulatory Visit: Payer: Self-pay | Admitting: Internal Medicine

## 2023-03-20 LAB — TSH: TSH: 1.48 u[IU]/mL (ref 0.450–4.500)

## 2023-03-20 LAB — T4, FREE: Free T4: 1.86 ng/dL — ABNORMAL HIGH (ref 0.82–1.77)

## 2023-03-26 ENCOUNTER — Encounter: Payer: Self-pay | Admitting: "Endocrinology

## 2023-03-26 ENCOUNTER — Ambulatory Visit (INDEPENDENT_AMBULATORY_CARE_PROVIDER_SITE_OTHER): Payer: Medicare Other | Admitting: "Endocrinology

## 2023-03-26 VITALS — BP 124/80 | HR 56 | Ht 70.0 in | Wt 274.0 lb

## 2023-03-26 DIAGNOSIS — E890 Postprocedural hypothyroidism: Secondary | ICD-10-CM | POA: Diagnosis not present

## 2023-03-26 MED ORDER — LEVOTHYROXINE SODIUM 150 MCG PO TABS: 150.0000 ug | ORAL_TABLET | Freq: Every day | ORAL | 1 refills | Status: DC
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
## 2023-06-11 ENCOUNTER — Ambulatory Visit (INDEPENDENT_AMBULATORY_CARE_PROVIDER_SITE_OTHER): Admitting: Gastroenterology

## 2023-06-11 ENCOUNTER — Encounter: Payer: Self-pay | Admitting: Gastroenterology

## 2023-06-11 VITALS — BP 138/85 | HR 69 | Temp 96.9°F | Ht 70.0 in | Wt 277.4 lb

## 2023-06-11 DIAGNOSIS — R053 Chronic cough: Secondary | ICD-10-CM

## 2023-06-11 DIAGNOSIS — K7581 Nonalcoholic steatohepatitis (NASH): Secondary | ICD-10-CM | POA: Diagnosis not present

## 2023-06-11 DIAGNOSIS — K7460 Unspecified cirrhosis of liver: Secondary | ICD-10-CM | POA: Diagnosis not present

## 2023-06-11 DIAGNOSIS — K2270 Barrett's esophagus without dysplasia: Secondary | ICD-10-CM

## 2023-06-11 DIAGNOSIS — K219 Gastro-esophageal reflux disease without esophagitis: Secondary | ICD-10-CM

## 2023-06-11 DIAGNOSIS — K2100 Gastro-esophageal reflux disease with esophagitis, without bleeding: Secondary | ICD-10-CM

## 2023-06-11 MED ORDER — PANTOPRAZOLE SODIUM 40 MG PO TBEC: 40.0000 mg | DELAYED_RELEASE_TABLET | Freq: Every day | ORAL | 3 refills | Status: AC

## 2023-06-11 NOTE — Patient Instructions (Signed)
 Please have blood work completed at [facility].  We will call you with results once they have been received. Please allow 3-5 business days for review. 2 locations for [facility] in [place]:              1. [address], [place]              2. [address] [place]   We will get you scheduled for an ultrasound of your liver in the near future. Our office will reach out to you to let you know the date and time for this, and will be performed at [facility].  Cirrhosis Lifestyle Recommendations:  High-protein diet from a primarily plant-based diet. Avoid red meat.  No raw or undercooked meat, seafood, or shellfish. Low-fat/cholesterol/carbohydrate diet. Limit sodium to no more than 2000 mg/day including everything that you eat and drink. Recommend at least 30 minutes of aerobic and resistance exercise 3 days/week. Limit Tylenol  to 2000 mg daily.   To help with constipation, I do recommend that you either start with Metamucil 1 tablespoon daily in 6 of 8 ounces of water  or do MiraLAX  17 g (1 capful) at least every other day to help with more frequent bowel movements.  Based off your prior EGD and colonoscopy records, I will make sure that we discussed scheduling both an upper endoscopy and colonoscopy around February 2026.  Please monitor for increased swelling in your lower extremities, swelling around your abdomen, excessive itchiness throughout your body, confusion, and jaundice (yellowing of your skin or eyes).  Please notify me immediately if you notice any of these symptoms.  For your cough, this very well could be allergy induced therefore I recommend you start taking a nondrowsy daily allergy medication such as Allegra or Zyrtec.  If you get Zyrtec I would recommend taking this nightly.  To help keep your sinuses open I would recommend Flonase or Nasacort .  We will plan to follow-up in 6 months.  It was a pleasure to see you today. I want to create trusting  relationships with patients. If you receive a survey regarding your visit,  I greatly appreciate you taking time to fill this out on paper or through your MyChart. I value your feedback.  Julian Obey, MSN, FNP-BC, AGACNP-BC [facility]

## 2023-06-12 ENCOUNTER — Telehealth: Payer: Self-pay | Admitting: *Deleted

## 2023-06-12 NOTE — Telephone Encounter (Signed)
 LMOVM to call back Also sent mychart message.

## 2023-06-21 LAB — COMPREHENSIVE METABOLIC PANEL WITH GFR
ALT: 23 IU/L (ref 0–44)
AST: 25 IU/L (ref 0–40)
Albumin: 4.6 g/dL (ref 3.9–4.9)
Alkaline Phosphatase: 122 IU/L — ABNORMAL HIGH (ref 44–121)
BUN/Creatinine Ratio: 23 (ref 10–24)
BUN: 19 mg/dL (ref 8–27)
Bilirubin Total: 0.4 mg/dL (ref 0.0–1.2)
CO2: 23 mmol/L (ref 20–29)
Calcium: 9.3 mg/dL (ref 8.6–10.2)
Chloride: 104 mmol/L (ref 96–106)
Creatinine, Ser: 0.84 mg/dL (ref 0.76–1.27)
Globulin, Total: 2.2 g/dL (ref 1.5–4.5)
Glucose: 85 mg/dL (ref 70–99)
Potassium: 4.5 mmol/L (ref 3.5–5.2)
Sodium: 143 mmol/L (ref 134–144)
Total Protein: 6.8 g/dL (ref 6.0–8.5)
eGFR: 96 mL/min/{1.73_m2} (ref >59)

## 2023-06-21 LAB — AFP TUMOR MARKER: AFP, Serum, Tumor Marker: <1.8 ng/mL (ref 0.0–8.4)

## 2023-06-21 LAB — PROTIME-INR
INR: 1.2 (ref 0.9–1.2)
Prothrombin Time: 13 s — ABNORMAL HIGH (ref 9.1–12.0)

## 2023-06-21 LAB — CBC
Hematocrit: 43.3 % (ref 37.5–51.0)
Hemoglobin: 14.1 g/dL (ref 13.0–17.7)
MCH: 28.9 pg (ref 26.6–33.0)
MCHC: 32.6 g/dL (ref 31.5–35.7)
MCV: 89 fL (ref 79–97)
Platelets: 163 10*3/uL (ref 150–450)
RBC: 4.88 x10E6/uL (ref 4.14–5.80)
RDW: 13.9 % (ref 11.6–15.4)
WBC: 5.4 10*3/uL (ref 3.4–10.8)

## 2023-06-25 ENCOUNTER — Ambulatory Visit: Payer: Self-pay | Admitting: *Deleted

## 2023-06-25 DIAGNOSIS — R188 Other ascites: Secondary | ICD-10-CM

## 2023-06-28 ENCOUNTER — Ambulatory Visit (HOSPITAL_COMMUNITY)
Admission: RE | Admit: 2023-06-28 | Discharge: 2023-06-28 | Disposition: A | Discharge status: Home / Self Care | Source: Ambulatory Visit | Attending: Gastroenterology | Admitting: Gastroenterology

## 2023-06-28 DIAGNOSIS — R188 Other ascites: Secondary | ICD-10-CM | POA: Diagnosis present

## 2023-06-28 DIAGNOSIS — K7460 Unspecified cirrhosis of liver: Secondary | ICD-10-CM | POA: Insufficient documentation

## 2023-07-11 ENCOUNTER — Other Ambulatory Visit: Payer: Self-pay | Admitting: *Deleted

## 2023-07-11 ENCOUNTER — Telehealth: Payer: Self-pay | Admitting: *Deleted

## 2023-07-11 DIAGNOSIS — R32 Unspecified urinary incontinence: Secondary | ICD-10-CM

## 2023-07-11 DIAGNOSIS — N390 Urinary tract infection, site not specified: Secondary | ICD-10-CM

## 2023-07-11 NOTE — Telephone Encounter (Signed)
 Referral sent

## 2023-07-29 ENCOUNTER — Other Ambulatory Visit: Payer: Self-pay | Admitting: Gastroenterology

## 2023-07-29 ENCOUNTER — Ambulatory Visit (HOSPITAL_COMMUNITY)
Admission: RE | Admit: 2023-07-29 | Discharge: 2023-07-29 | Disposition: A | Discharge status: Home / Self Care | Source: Ambulatory Visit | Attending: Gastroenterology | Admitting: Gastroenterology

## 2023-07-29 DIAGNOSIS — K7460 Unspecified cirrhosis of liver: Secondary | ICD-10-CM | POA: Insufficient documentation

## 2023-07-29 DIAGNOSIS — R188 Other ascites: Secondary | ICD-10-CM | POA: Diagnosis present

## 2023-07-29 MED ORDER — IOHEXOL 300 MG/ML  SOLN
100.0000 mL | Freq: Once | INTRAMUSCULAR | Status: AC | PRN
  Administered 2023-07-29: 100 mL via INTRAVENOUS

## 2023-08-02 ENCOUNTER — Ambulatory Visit: Payer: Self-pay | Admitting: Gastroenterology
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
## 2023-08-27 ENCOUNTER — Ambulatory Visit (INDEPENDENT_AMBULATORY_CARE_PROVIDER_SITE_OTHER): Admitting: Urology

## 2023-08-27 VITALS — BP 145/87 | HR 67

## 2023-08-27 DIAGNOSIS — N138 Other obstructive and reflux uropathy: Secondary | ICD-10-CM

## 2023-08-27 DIAGNOSIS — N401 Enlarged prostate with lower urinary tract symptoms: Secondary | ICD-10-CM | POA: Diagnosis not present

## 2023-08-27 DIAGNOSIS — Z87898 Personal history of other specified conditions: Secondary | ICD-10-CM

## 2023-08-27 DIAGNOSIS — R338 Other retention of urine: Secondary | ICD-10-CM

## 2023-08-27 DIAGNOSIS — R8271 Bacteriuria: Secondary | ICD-10-CM

## 2023-08-27 DIAGNOSIS — R339 Retention of urine, unspecified: Secondary | ICD-10-CM

## 2023-08-28 LAB — URINALYSIS, ROUTINE W REFLEX MICROSCOPIC
Bilirubin, UA: NEGATIVE
Glucose, UA: NEGATIVE
Nitrite, UA: NEGATIVE
Protein,UA: NEGATIVE
Specific Gravity, UA: 1.025 (ref 1.005–1.030)
Urobilinogen, Ur: 1 mg/dL (ref 0.2–1.0)
pH, UA: 6.5 (ref 5.0–7.5)

## 2023-08-28 LAB — PSA: Prostate Specific Ag, Serum: 0.7 ng/mL (ref 0.0–4.0)

## 2023-08-28 LAB — MICROSCOPIC EXAMINATION

## 2023-09-14 ENCOUNTER — Other Ambulatory Visit: Payer: Self-pay | Admitting: "Endocrinology

## 2023-09-14 DIAGNOSIS — E890 Postprocedural hypothyroidism: Secondary | ICD-10-CM

## 2023-09-18 LAB — LIPID PANEL
Chol/HDL Ratio: 3.7 ratio (ref 0.0–5.0)
Cholesterol, Total: 156 mg/dL (ref 100–199)
HDL: 42 mg/dL (ref >39)
LDL Chol Calc (NIH): 93 mg/dL (ref 0–99)
Triglycerides: 115 mg/dL (ref 0–149)
VLDL Cholesterol Cal: 21 mg/dL (ref 5–40)

## 2023-09-18 LAB — T4, FREE: Free T4: 1.57 ng/dL (ref 0.82–1.77)

## 2023-09-18 LAB — TSH: TSH: 6.61 u[IU]/mL — ABNORMAL HIGH (ref 0.450–4.500)

## 2023-09-23 ENCOUNTER — Ambulatory Visit (INDEPENDENT_AMBULATORY_CARE_PROVIDER_SITE_OTHER): Payer: Medicare Other | Admitting: "Endocrinology

## 2023-09-23 ENCOUNTER — Encounter: Payer: Self-pay | Admitting: "Endocrinology

## 2023-09-23 DIAGNOSIS — E890 Postprocedural hypothyroidism: Secondary | ICD-10-CM

## 2023-09-23 MED ORDER — LEVOTHYROXINE SODIUM 175 MCG PO TABS: 175.0000 ug | ORAL_TABLET | Freq: Every day | ORAL | 1 refills | Status: DC
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
## 2023-10-24 ENCOUNTER — Encounter: Payer: Self-pay | Admitting: Gastroenterology

## 2023-11-28 ENCOUNTER — Other Ambulatory Visit: Payer: Self-pay

## 2023-11-28 ENCOUNTER — Emergency Department (HOSPITAL_COMMUNITY)

## 2023-11-28 ENCOUNTER — Encounter (HOSPITAL_COMMUNITY): Payer: Self-pay

## 2023-11-28 ENCOUNTER — Emergency Department (HOSPITAL_COMMUNITY)
Admission: EM | Admit: 2023-11-28 | Discharge: 2023-11-28 | Disposition: A | Discharge status: Home / Self Care | Source: Ambulatory Visit | Attending: Emergency Medicine | Admitting: Emergency Medicine

## 2023-11-28 DIAGNOSIS — Z7901 Long term (current) use of anticoagulants: Secondary | ICD-10-CM | POA: Insufficient documentation

## 2023-11-28 DIAGNOSIS — I480 Paroxysmal atrial fibrillation: Secondary | ICD-10-CM | POA: Insufficient documentation

## 2023-11-28 DIAGNOSIS — I4891 Unspecified atrial fibrillation: Secondary | ICD-10-CM | POA: Diagnosis present

## 2023-11-28 LAB — CBC WITH DIFFERENTIAL/PLATELET
Abs Immature Granulocytes: 0.02 K/uL (ref 0.00–0.07)
Basophils Absolute: 0 K/uL (ref 0.0–0.1)
Basophils Relative: 1 %
Eosinophils Absolute: 0.1 K/uL (ref 0.0–0.5)
Eosinophils Relative: 1 %
HCT: 41.7 % (ref 39.0–52.0)
Hemoglobin: 13.7 g/dL (ref 13.0–17.0)
Immature Granulocytes: 0 %
Lymphocytes Relative: 25 %
Lymphs Abs: 1.4 K/uL (ref 0.7–4.0)
MCH: 31 pg (ref 26.0–34.0)
MCHC: 32.9 g/dL (ref 30.0–36.0)
MCV: 94.3 fL (ref 80.0–100.0)
Monocytes Absolute: 0.4 K/uL (ref 0.1–1.0)
Monocytes Relative: 7 %
Neutro Abs: 3.8 K/uL (ref 1.7–7.7)
Neutrophils Relative %: 66 %
Platelets: 164 K/uL (ref 150–400)
RBC: 4.42 MIL/uL (ref 4.22–5.81)
RDW: 13.2 % (ref 11.5–15.5)
WBC: 5.7 K/uL (ref 4.0–10.5)
nRBC: 0 % (ref 0.0–0.2)

## 2023-11-28 LAB — COMPREHENSIVE METABOLIC PANEL WITH GFR
ALT: 25 U/L (ref 0–44)
AST: 22 U/L (ref 15–41)
Albumin: 4.3 g/dL (ref 3.5–5.0)
Alkaline Phosphatase: 101 U/L (ref 38–126)
Anion gap: 12 (ref 5–15)
BUN: 15 mg/dL (ref 8–23)
CO2: 26 mmol/L (ref 22–32)
Calcium: 8.9 mg/dL (ref 8.9–10.3)
Chloride: 103 mmol/L (ref 98–111)
Creatinine, Ser: 0.92 mg/dL (ref 0.61–1.24)
GFR, Estimated: >60 mL/min (ref >60)
Glucose, Bld: 97 mg/dL (ref 70–99)
Potassium: 4.4 mmol/L (ref 3.5–5.1)
Sodium: 141 mmol/L (ref 135–145)
Total Bilirubin: 0.5 mg/dL (ref 0.0–1.2)
Total Protein: 6.6 g/dL (ref 6.5–8.1)

## 2023-11-28 LAB — PROTIME-INR
INR: 1.1 (ref 0.8–1.2)
Prothrombin Time: 14.9 s (ref 11.4–15.2)

## 2023-11-28 LAB — TSH: TSH: 1.21 u[IU]/mL (ref 0.350–4.500)

## 2023-11-28 LAB — TROPONIN T, HIGH SENSITIVITY: Troponin T High Sensitivity: 20 ng/L — ABNORMAL HIGH (ref 0–19)

## 2023-11-28 MED ORDER — APIXABAN 5 MG PO TABS: 5.0000 mg | ORAL_TABLET | Freq: Two times a day (BID) | ORAL | 0 refills | Status: DC

## 2023-11-28 MED ORDER — SODIUM CHLORIDE 0.9 % IV BOLUS
500.0000 mL | Freq: Once | INTRAVENOUS | Status: AC
  Administered 2023-11-28: 500 mL via INTRAVENOUS

## 2023-11-28 NOTE — ED Triage Notes (Signed)
 Pt arrived via POV following cataract surgery earlier today and reports he was on their monitor and it showed A-Fib, which led them to advise the Pt to go to the ER for evaluation.

## 2023-11-28 NOTE — ED Provider Notes (Signed)
 [place] [facility] DE[facility]MENT AT [facility] Provider Note   CSN: [id number] Arrival date & time: 11/28/23  1356     Patient presents with: Atrial Fibrillation   Darren Harrison is a 68 y.o. male.  He had outpatient cataract surgery today by Dr. Andra in [place].  During or after the procedure sounds like they identified he was in A-fib.  They sent him here for further evaluation.  He said he had 1 prior history of A-fib years ago when his thyroid  was off and was on anticoagulation at that time.  He is currently not on anticoagulation.SABRA  He endorses no chest pain shortness of breath fevers chills nausea vomiting.  He tried to go to his PCP but they were unable to see him so he came here instead.   The history is provided by the patient and the spouse.  Atrial Fibrillation This is a recurrent problem. Pertinent negatives include no chest pain, no abdominal pain, no headaches and no shortness of breath. Nothing aggravates the symptoms. Nothing relieves the symptoms. He has tried nothing for the symptoms. The treatment provided no relief.       Prior to Admission medications   Medication Sig Start Date End Date Taking? Authorizing Provider  acetaminophen  (TYLENOL ) 325 MG tablet Take 2 tablets (650 mg total) by mouth every 4 (four) hours as needed for mild pain ((score 1 to 3) or temp > 100.5). 08/02/20   Angiulli, Daniel J, PA-C  Amino Acids (AMINO ACID PO) Take 1 capsule by mouth 3 (three) times daily.    [provider]  calcium carbonate (TUMS - DOSED IN MG ELEMENTAL CALCIUM) 500 MG chewable tablet Chew 1 tablet by mouth as needed for indigestion or heartburn.    [provider]  cephALEXin (KEFLEX) 500 MG capsule Take 500 mg by mouth 3 (three) times daily. 11/11/23   [provider]  ciprofloxacin (CIPRO) 500 MG tablet Take 500 mg by mouth daily. 06/05/23   [provider]  diclofenac  Sodium (VOLTAREN ) 1 % GEL Apply 1 Application  topically daily as needed (joint pain).    [provider]  finasteride  (PROSCAR ) 5 MG tablet Take 5 mg by mouth daily. 07/21/22   [provider]  levothyroxine  (SYNTHROID ) 175 MCG tablet Take 1 tablet (175 mcg total) by mouth daily before breakfast. 09/23/23   Nida, Gebreselassie W, MD  lidocaine  (LIDODERM ) 5 % Apply topically. 09/30/20   [provider]  losartan  (COZAAR ) 50 MG tablet Take 50 mg by mouth 2 (two) times daily.    [provider]  metoprolol  tartrate (LOPRESSOR ) 25 MG tablet Take 25 mg by mouth 2 (two) times daily. 05/23/21   [provider]  Multiple Vitamins-Minerals (CENTRUM ADULTS PO) Take 1 tablet by mouth daily.    [provider]  pantoprazole  (PROTONIX ) 40 MG tablet Take 1 tablet (40 mg total) by mouth daily before breakfast. 06/11/23   Kennedy Charmaine CROME, NP  polyethylene glycol (MIRALAX  / GLYCOLAX ) 17 g packet Take 17 g by mouth daily. Patient taking differently: Take 17 g by mouth daily as needed for moderate constipation. 08/03/20   Angiulli, Toribio PARAS, PA-C  sulfamethoxazole -trimethoprim  (BACTRIM  DS) 800-160 MG tablet Take 1 tablet by mouth 2 (two) times daily. 11/11/23   [provider]  tamsulosin  (FLOMAX ) 0.4 MG CAPS capsule TAKE 2 CAPSULES BY MOUTH DAILY AFTER SUPPER. 08/22/22   Lovorn, Megan, MD  Zinc  50 MG TABS Take 50 mg by mouth daily. 01/29/19  [provider]  apixaban  (ELIQUIS ) 5 MG TABS tablet Take 1 tablet (5 mg total) by mouth 2 (two) times daily. 08/08/20 08/09/20  Angiulli, Toribio PARAS, PA-C  enoxaparin  (LOVENOX ) 40 MG/0.4ML injection Inject 1 syringe (40 mg) daily through 10/03/2020 and stop 08/02/20 08/08/20  Angiulli, Toribio PARAS, PA-C    Allergies: Patient has no known allergies.    Review of Systems  Respiratory:  Negative for shortness of breath.   Cardiovascular:  Negative for chest pain.  Gastrointestinal:  Negative for abdominal pain.  Neurological:  Negative for headaches.    Updated  Vital Signs BP 94/70   Pulse 78   Temp 98.1 F (36.7 C)   Resp 19   Ht 5' 10 (1.778 m)   Wt 122.4 kg   SpO2 93%   BMI 38.72 kg/m   Physical Exam Vitals and nursing note reviewed.  Constitutional:      Appearance: Normal appearance. He is well-developed.  HENT:     Head: Normocephalic and atraumatic.  Eyes:     Comments: He has a hard shield over his right eye and is wearing sunglasses.  Eye exam deferred.  Cardiovascular:     Rate and Rhythm: Normal rate. Rhythm irregular.     Heart sounds: No murmur heard. Pulmonary:     Effort: Pulmonary effort is normal. No respiratory distress.     Breath sounds: Normal breath sounds.  Abdominal:     Palpations: Abdomen is soft.     Tenderness: There is no abdominal tenderness.  Musculoskeletal:     Cervical back: Neck supple.  Skin:    General: Skin is warm and dry.  Neurological:     Mental Status: He is alert.     GCS: GCS eye subscore is 4. GCS verbal subscore is 5. GCS motor subscore is 6.     (all labs ordered are listed, but only abnormal results are displayed) Labs Reviewed  TROPONIN T, HIGH SENSITIVITY - Abnormal; Notable for the following components:      Result Value   Troponin T High Sensitivity 20 (*)    All other components within normal limits  CBC WITH DIFFERENTIAL/PLATELET  COMPREHENSIVE METABOLIC PANEL WITH GFR  TSH  PROTIME-INR    EKG: EKG Interpretation Date/Time:  Thursday November 28 2023 14:21:10 EDT Ventricular Rate:  108 PR Interval:    QRS Duration:  96 QT Interval:  368 QTC Calculation: 493 R Axis:   -4  Text Interpretation: Atrial fibrillation with rapid ventricular response with premature ventricular or aberrantly conducted complexes Low voltage QRS Cannot rule out Anterior infarct , age undetermined Abnormal ECG When compared with ECG of 13-Jun-2021 10:31, Atrial fibrillation has replaced Sinus rhythm Vent. rate has increased BY  47 BPM Confirmed by Towana Sharper [phone]) on 11/28/2023  2:59:08 PM  Radiology: DG Chest 2 View Result Date: 11/28/2023 CLINICAL DATA:  Chest pain. EXAM: DG CHEST 2V COMPARISON:  Chest radiograph dated 06/13/2021. FINDINGS: There is eventration of the right hemidiaphragm. No focal consolidation, pleural effusion or pneumothorax. Stable cardiac silhouette. No acute osseous pathology. IMPRESSION: No active cardiopulmonary disease. Electronically Signed   By: Vanetta Chou M.D.   On: 11/28/2023 14:55     Procedures   Medications Ordered in the ED  sodium chloride  0.9 % bolus 500 mL (0 mLs Intravenous Stopped 11/28/23 1924)    Clinical Course as of 11/29/23 0927  Thu Nov 28, 2023  1617 I reached out to his ophthalmologist and they confirmed that he can be anticoagulated. [  MB]  1713 Patient's CHA2DS2-VASc is 2. [MB]    Clinical Course User Index [MB] Towana Ozell BROCKS, MD                                 Medical Decision Making Amount and/or Complexity of Data Reviewed Labs: ordered. Radiology: ordered.  Risk Prescription drug management.   This patient complains of being in atrial fibrillation; this involves an extensive number of treatment Options and is a complaint that carries with it a high risk of complications and morbidity. The differential includes A-fib, sinus rhythm, metabolic derangement, thyroid  disease  I ordered, reviewed and interpreted labs, which included CBC normal chemistries normal TSH normal I ordered medication IV fluids and reviewed PMP when indicated. I ordered imaging studies which included chest x-ray and I independently    visualized and interpreted imaging which showed no acute findings Additional history obtained from patient's wife Previous records obtained and reviewed prior cardiology notes, had been in A-fib in the past asked I consulted patient's eye doctor and they confirmed he can be anticoagulated and discussed lab and imaging findings and discussed disposition.  Cardiac monitoring reviewed,  A-fib with controlled rate Social determinants considered, no significant barriers Critical Interventions: None  After the interventions stated above, I reevaluated the patient and found patient to be asymptomatic Admission and further testing considered, no indications for admission.  Will start on Eliquis  for elevated CHA2DS2-VASc.  Referral placed for A-fib clinic.  Patient comfortable plan for discharge and outpatient follow-up.  Return instructions discussed      Final diagnoses:  Paroxysmal atrial fibrillation [facility])    ED Discharge Orders          Ordered    apixaban  (ELIQUIS ) 5 MG TABS tablet  2 times daily        11/28/23 1905    Amb Referral to AFIB Clinic        11/28/23 1906               Towana Ozell BROCKS, MD 11/29/23 [phone]

## 2023-11-28 NOTE — Discharge Instructions (Addendum)
 Seen in the emergency department after your anesthesia team noted you to be in atrial fibrillation.  Your lab work was otherwise unremarkable.  We are starting you back on a blood thinner.  I have also put a referral in for her atrial fibrillation clinic.  Please return to the emergency department if any worsening or concerning symptoms

## 2023-12-06 ENCOUNTER — Ambulatory Visit (HOSPITAL_COMMUNITY)
Admission: RE | Admit: 2023-12-06 | Discharge: 2023-12-06 | Disposition: A | Discharge status: Home / Self Care | Source: Ambulatory Visit | Attending: Internal Medicine | Admitting: Internal Medicine

## 2023-12-06 ENCOUNTER — Encounter (HOSPITAL_COMMUNITY): Payer: Self-pay | Admitting: Internal Medicine

## 2023-12-06 ENCOUNTER — Other Ambulatory Visit (HOSPITAL_COMMUNITY): Payer: Self-pay

## 2023-12-06 VITALS — BP 148/110 | HR 80 | Ht 70.0 in | Wt 271.6 lb

## 2023-12-06 DIAGNOSIS — I4891 Unspecified atrial fibrillation: Secondary | ICD-10-CM | POA: Diagnosis present

## 2023-12-06 DIAGNOSIS — I480 Paroxysmal atrial fibrillation: Secondary | ICD-10-CM | POA: Diagnosis present

## 2023-12-06 DIAGNOSIS — D6869 Other thrombophilia: Secondary | ICD-10-CM | POA: Insufficient documentation

## 2023-12-06 DIAGNOSIS — I4819 Other persistent atrial fibrillation: Secondary | ICD-10-CM | POA: Diagnosis present

## 2023-12-06 NOTE — Patient Instructions (Signed)
 Cardioversion scheduled for: 12/20/23 Monday 7:00am    - Arrive at the [employer] [facility] ([address])  and check in with ADMITTING at 7:00am   - Do not eat or drink anything after midnight the night prior to your procedure.   - Take all your morning medication (except diabetic medications) with a sip of water  prior to arrival.  - Do NOT miss any doses of your blood thinner - if you should miss a dose or take a dose more than 4 hours late -- please notify our office immediately.  - You will not be able to drive home after your procedure. Please ensure you have a responsible adult to drive you home. You will need someone with you for 24 hours post procedure.     - Expect to be in the procedural area approximately 2 hours.   - If you feel as if you go back into normal rhythm prior to scheduled cardioversion, please notify our office immediately.   If your procedure is canceled in the cardioversion suite you will be charged a cancellation fee.

## 2023-12-06 NOTE — Progress Notes (Signed)
 Primary Care Physician: Starla Stapler, FNP Primary Cardiologist: None Electrophysiologist: None     Referring Physician: ED     Darren Harrison is a 68 y.o. male with a history of HTN, cataract, Graves disease, and atrial fibrillation who presents for consultation in the [facility]. ED visit on 10/16 for Afib after being noted to be in Afib after cataract surgery earlier same day. Patient is on Eliquis  for stroke prevention.  On evaluation today, patient is currently in Afib. No missed doses of Eliquis . He takes Lopressor  25 mg BID. He is unsure if he snores at night.   Today, he denies symptoms of palpitations, chest pain, shortness of breath, orthopnea, PND, lower extremity edema, dizziness, presyncope, syncope, bleeding, or neurologic sequela. The patient is tolerating medications without difficulties and is otherwise without complaint today.    he has a BMI of Body mass index is 38.97 kg/m.SABRA Filed Weights   12/06/23 1121  Weight: 123.2 kg    Current Outpatient Medications  Medication Sig Dispense Refill   acetaminophen  (TYLENOL ) 325 MG tablet Take 2 tablets (650 mg total) by mouth every 4 (four) hours as needed for mild pain ((score 1 to 3) or temp > 100.5).     Amino Acids (AMINO ACID PO) Take 1 capsule by mouth in the morning and at bedtime.     apixaban  (ELIQUIS ) 5 MG TABS tablet Take 1 tablet (5 mg total) by mouth 2 (two) times daily. 60 tablet 0   diclofenac  Sodium (VOLTAREN ) 1 % GEL Apply 1 Application topically daily as needed (joint pain).     finasteride  (PROSCAR ) 5 MG tablet Take 5 mg by mouth daily.     levothyroxine  (SYNTHROID ) 175 MCG tablet Take 1 tablet (175 mcg total) by mouth daily before breakfast. 90 tablet 1   losartan  (COZAAR ) 50 MG tablet Take 50 mg by mouth 2 (two) times daily.     metoprolol  tartrate (LOPRESSOR ) 25 MG tablet Take 25 mg by mouth 2 (two) times daily.     Multiple Vitamins-Minerals (CENTRUM ADULTS PO) Take  1 tablet by mouth every evening.     pantoprazole  (PROTONIX ) 40 MG tablet Take 1 tablet (40 mg total) by mouth daily before breakfast. 90 tablet 3   tamsulosin  (FLOMAX ) 0.4 MG CAPS capsule TAKE 2 CAPSULES BY MOUTH DAILY AFTER SUPPER. (Patient taking differently: Take 0.8 mg by mouth daily after supper.) 180 capsule 1   Zinc  50 MG TABS Take 50 mg by mouth daily.     No current facility-administered medications for this encounter.    Atrial Fibrillation Management history:  Previous antiarrhythmic drugs: none Previous cardioversions: none Previous ablations: none Anticoagulation history: Eliquis    ROS- All systems are reviewed and negative except as per the HPI above.  Physical Exam: BP (!) 148/110   Pulse 80   Ht 5' 10 (1.778 m)   Wt 123.2 kg   BMI 38.97 kg/m   GEN: Well nourished, well developed in no acute distress NECK: No JVD; No carotid bruits CARDIAC: Irregularly irregular rate and rhythm, no murmurs, rubs, gallops RESPIRATORY:  Clear to auscultation without rales, wheezing or rhonchi  ABDOMEN: Soft, non-tender, non-distended EXTREMITIES:  No edema; No deformity   EKG today demonstrates  Vent. rate 80 BPM PR interval * ms QRS duration 98 ms QT/QTcB 388/447 ms P-R-T axes * -14 5 Atrial fibrillation Abnormal ECG When compared with ECG of 28-Nov-2023 14:21, Previous ECG is present  Echo N/A  ASSESSMENT & PLAN  CHA2DS2-VASc Score = 2  The patient's score is based upon: CHF History: 0 HTN History: 1 Diabetes History: 0 Stroke History: 0 Vascular Disease History: 0 Age Score: 1 Gender Score: 0       ASSESSMENT AND PLAN: Persistent Atrial Fibrillation (ICD10:  I48.19) The patient's CHA2DS2-VASc score is 2, indicating a 2.2% annual risk of stroke.    Patient is currently in Afib. Education provided about Afib. We discussed the procedure cardioversion to try to convert to NSR. We discussed the risks vs benefits of this procedure and how ultimately we cannot  predict whether a patient will have early return of arrhythmia post procedure. After discussion, the patient wishes to proceed with cardioversion. He will need to complete 3 weeks of uninterrupted anticoagulation prior to date of cardioversion. Labs drawn on 10/16. Will order baseline echocardiogram. Defers sleep study today and will speak with wife.  Informed Consent   Shared Decision Making/Informed Consent The risks (stroke, cardiac arrhythmias rarely resulting in the need for a temporary or permanent pacemaker, skin irritation or burns and complications associated with conscious sedation including aspiration, arrhythmia, respiratory failure and death), benefits (restoration of normal sinus rhythm) and alternatives of a direct current cardioversion were explained in detail to Darren Harrison and he agrees to proceed.       Secondary Hypercoagulable State (ICD10:  D68.69) The patient is at significant risk for stroke/thromboembolism based upon his CHA2DS2-VASc Score of 2.  Continue Apixaban  (Eliquis ).  No missed doses - continue without interruption.     Follow up 2 weeks after DCCV.   [facility]  Afib Clinic [address] [place], [place] [address] [phone]

## 2023-12-06 NOTE — H&P (View-Only) (Signed)
 Primary Care Physician: Starla Stapler, FNP Primary Cardiologist: None Electrophysiologist: None     Referring Physician: ED     Darren Harrison is a 68 y.o. male with a history of HTN, cataract, Graves disease, and atrial fibrillation who presents for consultation in the [facility]. ED visit on 10/16 for Afib after being noted to be in Afib after cataract surgery earlier same day. Patient is on Eliquis  for stroke prevention.  On evaluation today, patient is currently in Afib. No missed doses of Eliquis . He takes Lopressor  25 mg BID. He is unsure if he snores at night.   Today, he denies symptoms of palpitations, chest pain, shortness of breath, orthopnea, PND, lower extremity edema, dizziness, presyncope, syncope, bleeding, or neurologic sequela. The patient is tolerating medications without difficulties and is otherwise without complaint today.    he has a BMI of Body mass index is 38.97 kg/m.SABRA Filed Weights   12/06/23 1121  Weight: 123.2 kg    Current Outpatient Medications  Medication Sig Dispense Refill   acetaminophen  (TYLENOL ) 325 MG tablet Take 2 tablets (650 mg total) by mouth every 4 (four) hours as needed for mild pain ((score 1 to 3) or temp > 100.5).     Amino Acids (AMINO ACID PO) Take 1 capsule by mouth in the morning and at bedtime.     apixaban  (ELIQUIS ) 5 MG TABS tablet Take 1 tablet (5 mg total) by mouth 2 (two) times daily. 60 tablet 0   diclofenac  Sodium (VOLTAREN ) 1 % GEL Apply 1 Application topically daily as needed (joint pain).     finasteride  (PROSCAR ) 5 MG tablet Take 5 mg by mouth daily.     levothyroxine  (SYNTHROID ) 175 MCG tablet Take 1 tablet (175 mcg total) by mouth daily before breakfast. 90 tablet 1   losartan  (COZAAR ) 50 MG tablet Take 50 mg by mouth 2 (two) times daily.     metoprolol  tartrate (LOPRESSOR ) 25 MG tablet Take 25 mg by mouth 2 (two) times daily.     Multiple Vitamins-Minerals (CENTRUM ADULTS PO) Take  1 tablet by mouth every evening.     pantoprazole  (PROTONIX ) 40 MG tablet Take 1 tablet (40 mg total) by mouth daily before breakfast. 90 tablet 3   tamsulosin  (FLOMAX ) 0.4 MG CAPS capsule TAKE 2 CAPSULES BY MOUTH DAILY AFTER SUPPER. (Patient taking differently: Take 0.8 mg by mouth daily after supper.) 180 capsule 1   Zinc  50 MG TABS Take 50 mg by mouth daily.     No current facility-administered medications for this encounter.    Atrial Fibrillation Management history:  Previous antiarrhythmic drugs: none Previous cardioversions: none Previous ablations: none Anticoagulation history: Eliquis    ROS- All systems are reviewed and negative except as per the HPI above.  Physical Exam: BP (!) 148/110   Pulse 80   Ht 5' 10 (1.778 m)   Wt 123.2 kg   BMI 38.97 kg/m   GEN: Well nourished, well developed in no acute distress NECK: No JVD; No carotid bruits CARDIAC: Irregularly irregular rate and rhythm, no murmurs, rubs, gallops RESPIRATORY:  Clear to auscultation without rales, wheezing or rhonchi  ABDOMEN: Soft, non-tender, non-distended EXTREMITIES:  No edema; No deformity   EKG today demonstrates  Vent. rate 80 BPM PR interval * ms QRS duration 98 ms QT/QTcB 388/447 ms P-R-T axes * -14 5 Atrial fibrillation Abnormal ECG When compared with ECG of 28-Nov-2023 14:21, Previous ECG is present  Echo N/A  ASSESSMENT & PLAN  CHA2DS2-VASc Score = 2  The patient's score is based upon: CHF History: 0 HTN History: 1 Diabetes History: 0 Stroke History: 0 Vascular Disease History: 0 Age Score: 1 Gender Score: 0       ASSESSMENT AND PLAN: Persistent Atrial Fibrillation (ICD10:  I48.19) The patient's CHA2DS2-VASc score is 2, indicating a 2.2% annual risk of stroke.    Patient is currently in Afib. Education provided about Afib. We discussed the procedure cardioversion to try to convert to NSR. We discussed the risks vs benefits of this procedure and how ultimately we cannot  predict whether a patient will have early return of arrhythmia post procedure. After discussion, the patient wishes to proceed with cardioversion. He will need to complete 3 weeks of uninterrupted anticoagulation prior to date of cardioversion. Labs drawn on 10/16. Will order baseline echocardiogram. Defers sleep study today and will speak with wife.  Informed Consent   Shared Decision Making/Informed Consent The risks (stroke, cardiac arrhythmias rarely resulting in the need for a temporary or permanent pacemaker, skin irritation or burns and complications associated with conscious sedation including aspiration, arrhythmia, respiratory failure and death), benefits (restoration of normal sinus rhythm) and alternatives of a direct current cardioversion were explained in detail to Darren Harrison and he agrees to proceed.       Secondary Hypercoagulable State (ICD10:  D68.69) The patient is at significant risk for stroke/thromboembolism based upon his CHA2DS2-VASc Score of 2.  Continue Apixaban  (Eliquis ).  No missed doses - continue without interruption.     Follow up 2 weeks after DCCV.   [facility]  Afib Clinic [address] [place], [place] [address] [phone]
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
## 2023-12-09 ENCOUNTER — Encounter: Payer: Self-pay | Admitting: Gastroenterology

## 2023-12-09 ENCOUNTER — Ambulatory Visit (INDEPENDENT_AMBULATORY_CARE_PROVIDER_SITE_OTHER): Admitting: Gastroenterology

## 2023-12-09 VITALS — BP 126/85 | HR 77 | Temp 97.7°F | Ht 70.0 in | Wt 273.6 lb

## 2023-12-09 DIAGNOSIS — K7469 Other cirrhosis of liver: Secondary | ICD-10-CM

## 2023-12-09 DIAGNOSIS — K2270 Barrett's esophagus without dysplasia: Secondary | ICD-10-CM | POA: Diagnosis not present

## 2023-12-09 DIAGNOSIS — K7581 Nonalcoholic steatohepatitis (NASH): Secondary | ICD-10-CM | POA: Diagnosis not present

## 2023-12-09 DIAGNOSIS — K2100 Gastro-esophageal reflux disease with esophagitis, without bleeding: Secondary | ICD-10-CM

## 2023-12-09 DIAGNOSIS — K5904 Chronic idiopathic constipation: Secondary | ICD-10-CM

## 2023-12-09 DIAGNOSIS — R600 Localized edema: Secondary | ICD-10-CM

## 2023-12-09 DIAGNOSIS — I480 Paroxysmal atrial fibrillation: Secondary | ICD-10-CM

## 2023-12-09 NOTE — Patient Instructions (Addendum)
 We will plan for labs and US  in December to monitor your cirrhosis.   Cirrhosis Lifestyle Recommendations:  High-protein diet from a primarily plant-based diet. Avoid red meat.  No raw or undercooked meat, seafood, or shellfish. Low-fat/cholesterol/carbohydrate diet. Limit sodium to no more than 2000 mg/day including everything that you eat and drink. Recommend at least 30 minutes of aerobic and resistance exercise 3 days/week. Limit Tylenol  to 2000 mg daily.   Continue to work with physical therapy and regular exercise.  Continue your pantoprazole  40 mg once daily.  For your constipation I want you to start taking MiraLAX  1 capful (17 g) in 6 to 8 ounces of water  every other day.  Ideally I would like for you to have a bowel movement at least once a day but if we can initially get you to a goal of every other day rather than once weekly that would be great.  We can consider increasing MiraLAX  or trialing lactulose in the future if having no improvement.  If you are having worsening leg swelling between now and follow-up please let me know and we can consider some low-dose diuretics.  Follow-up in February for us  to discuss your upper endoscopy and colonoscopy.  It was a pleasure to see you today. I want to create trusting relationships with patients. If you receive a survey regarding your visit,  I greatly appreciate you taking time to fill this out on paper or through your MyChart. I value your feedback.  Charmaine Melia, MSN, FNP-BC, AGACNP-BC [facility]

## 2023-12-20 NOTE — Progress Notes (Signed)
 Spoke to patient's wife and instructed them to come at 0645  and to be NPO after 0000.     Confirmed that patient will have a ride home and someone to stay with them for 24 hours after the procedure.   Medications reviewed.  Confirmed blood thinner.  Confirmed no breaks in taking blood thinner for 3+ weeks prior to procedure.

## 2023-12-20 NOTE — Progress Notes (Signed)
 Attempted to contact patient. Unidentified VM, left message to return call.

## 2023-12-22 NOTE — Anesthesia Preprocedure Evaluation (Signed)
 Anesthesia Evaluation  Patient identified by MRN, date of birth, ID band Patient awake    Reviewed: Allergy & Precautions, NPO status , Patient's Chart, lab work & pertinent test results  History of Anesthesia Complications Negative for: history of anesthetic complications  Airway Mallampati: III  TM Distance: >3 FB Neck ROM: Full   Comment: Previous grade I view with Glidescope 3, easy mask with [facility]  (+) Dental Advisory Given 1 tooth on the top, 7 on the bottom, denies loose teeth:   Pulmonary neg pulmonary ROS, neg shortness of breath, neg sleep apnea, neg COPD, neg recent URI   Pulmonary exam normal breath sounds clear to auscultation       Cardiovascular hypertension (losartan , metoprolol ), Pt. on medications and Pt. on home beta blockers (-) angina (-) Past MI, (-) Cardiac Stents and (-) CABG + dysrhythmias Atrial Fibrillation  Rhythm:Irregular Rate:Normal     Neuro/Psych neg Seizures  Neuromuscular disease (neurogenic bowel and bladder, quadriplegia, lumbar spondylolisthesis)    GI/Hepatic hiatal hernia,GERD  Medicated,,(+) Cirrhosis   Esophageal Varices and ascites      Endo/Other  neg diabetesHypothyroidism    Renal/GU negative Renal ROS   BPH    Musculoskeletal  (+) Arthritis , Osteoarthritis,    Abdominal  (+) + obese  Peds  Hematology negative hematology ROS (+) Lab Results      Component                Value               Date                      WBC                      5.7                 11/28/2023                HGB                      13.7                11/28/2023                HCT                      41.7                11/28/2023                MCV                      94.3                11/28/2023                PLT                      164                 11/28/2023              Anesthesia Other Findings Last Eliquis : this morning  Reproductive/Obstetrics                               Anesthesia Physical Anesthesia  Plan  ASA: 3  Anesthesia Plan: General   Post-op Pain Management: Minimal or no pain anticipated   Induction: Intravenous  PONV Risk Score and Plan: 2 and TIVA and Treatment may vary due to age or medical condition  Airway Management Planned: Natural Airway and Nasal Cannula  Additional Equipment:   Intra-op Plan:   Post-operative Plan:   Informed Consent: I have reviewed the patients History and Physical, chart, labs and discussed the procedure including the risks, benefits and alternatives for the proposed anesthesia with the patient or authorized representative who has indicated his/her understanding and acceptance.     Dental advisory given  Plan Discussed with: CRNA and Anesthesiologist  Anesthesia Plan Comments: (Risks of general anesthesia discussed including, but not limited to, sore throat, hoarse voice, chipped/damaged teeth, injury to vocal cords, nausea and vomiting, allergic reactions, lung infection, heart attack, stroke, and death. All questions answered. )         Anesthesia Quick Evaluation

## 2023-12-23 ENCOUNTER — Encounter (HOSPITAL_COMMUNITY)
Admission: RE | Disposition: A | Discharge status: Home / Self Care | Payer: Self-pay | Source: Home / Self Care | Attending: Internal Medicine

## 2023-12-23 ENCOUNTER — Telehealth: Payer: Self-pay | Admitting: Pharmacy Technician

## 2023-12-23 ENCOUNTER — Encounter (HOSPITAL_COMMUNITY): Payer: Self-pay | Admitting: Internal Medicine

## 2023-12-23 ENCOUNTER — Ambulatory Visit (HOSPITAL_COMMUNITY): Payer: Self-pay | Admitting: Anesthesiology

## 2023-12-23 ENCOUNTER — Other Ambulatory Visit: Payer: Self-pay

## 2023-12-23 ENCOUNTER — Other Ambulatory Visit (HOSPITAL_COMMUNITY): Payer: Self-pay

## 2023-12-23 ENCOUNTER — Ambulatory Visit (HOSPITAL_COMMUNITY)
Admission: RE | Admit: 2023-12-23 | Discharge: 2023-12-23 | Disposition: A | Discharge status: Home / Self Care | Attending: Internal Medicine | Admitting: Internal Medicine

## 2023-12-23 DIAGNOSIS — Z7901 Long term (current) use of anticoagulants: Secondary | ICD-10-CM | POA: Insufficient documentation

## 2023-12-23 DIAGNOSIS — I4891 Unspecified atrial fibrillation: Secondary | ICD-10-CM

## 2023-12-23 DIAGNOSIS — I4819 Other persistent atrial fibrillation: Secondary | ICD-10-CM | POA: Diagnosis present

## 2023-12-23 DIAGNOSIS — K7460 Unspecified cirrhosis of liver: Secondary | ICD-10-CM | POA: Diagnosis not present

## 2023-12-23 DIAGNOSIS — I10 Essential (primary) hypertension: Secondary | ICD-10-CM | POA: Diagnosis not present

## 2023-12-23 DIAGNOSIS — Z79899 Other long term (current) drug therapy: Secondary | ICD-10-CM | POA: Diagnosis not present

## 2023-12-23 DIAGNOSIS — K219 Gastro-esophageal reflux disease without esophagitis: Secondary | ICD-10-CM | POA: Insufficient documentation

## 2023-12-23 DIAGNOSIS — Z006 Encounter for examination for normal comparison and control in clinical research program: Secondary | ICD-10-CM

## 2023-12-23 HISTORY — PX: CARDIOVERSION: EP1203

## 2023-12-23 SURGERY — CARDIOVERSION (CATH LAB)
Anesthesia: General

## 2023-12-23 MED ORDER — LIDOCAINE 2% (20 MG/ML) 5 ML SYRINGE
INTRAMUSCULAR | Status: DC | PRN
  Administered 2023-12-23: 60 mg via INTRAVENOUS

## 2023-12-23 MED ORDER — SODIUM CHLORIDE 0.9% FLUSH
INTRAVENOUS | Status: DC | PRN
Start: 2023-12-23 — End: 2023-12-23
  Administered 2023-12-23: 10 mL via INTRAVENOUS

## 2023-12-23 MED ORDER — APIXABAN 5 MG PO TABS: 5.0000 mg | ORAL_TABLET | Freq: Two times a day (BID) | ORAL | 2 refills | Status: AC

## 2023-12-23 MED ORDER — SODIUM CHLORIDE 0.9 % IV SOLN: INTRAVENOUS | Status: DC

## 2023-12-23 MED ORDER — PROPOFOL 10 MG/ML IV BOLUS
INTRAVENOUS | Status: DC | PRN
  Administered 2023-12-23: 70 mg via INTRAVENOUS

## 2023-12-23 SURGICAL SUPPLY — 1 items: PAD DEFIB RADIO PHYSIO CONN (PAD) ×1 IMPLANT

## 2023-12-23 NOTE — Research (Signed)
 [facility] Cardioversion Informed Consent   Subject Name: Darren Harrison  Subject met inclusion and exclusion criteria.  The informed consent form, study requirements and expectations were reviewed with the subject and questions and concerns were addressed prior to the signing of the consent form.  The subject verbalized understanding of the trial requirements.  The subject agreed to participate in the [facility] Cardioversion trial and signed the informed consent at 0705 on 10/Nov/2025.  The informed consent was obtained prior to performance of any protocol-specific procedures for the subject.  A copy of the signed informed consent was given to the subject and a copy was placed in the subject's medical record.   Darren Harrison

## 2023-12-23 NOTE — Interval H&P Note (Signed)
 History and Physical Interval Note:  12/23/2023 7:16 AM  Darren Harrison  has presented today for surgery, with the diagnosis of AFIB.  The various methods of treatment have been discussed with the patient and family. After consideration of risks, benefits and other options for treatment, the patient has consented to  Procedure(s): CARDIOVERSION (N/A) as a surgical intervention.  The patient's history has been reviewed, patient examined, no change in status, stable for surgery.  I have reviewed the patient's chart and labs.  Questions were answered to the patient's satisfaction.     Emeline FORBES Calender

## 2023-12-23 NOTE — CV Procedure (Signed)
 Post Cardioversion Procedure Note  Procedure: Electrical Cardioversion Indications:  Atrial Fibrillation  Procedure Details:  Consent: Risks of procedure as well as the alternatives and risks of each were explained to the (patient/caregiver).  Consent for procedure obtained.  Patient stated he had only one week of Eliquis  left from his 30 day free trial. After discussion with pharmacy, his Eliqius will be about $120/mo for refills which he understands.   Time Out: Verified patient identification, verified procedure, site/side was marked, verified correct patient position, special equipment/implants available, medications/allergies/relevent history reviewed, required imaging and test results available.  Performed  Patient placed on cardiac monitor, pulse oximetry, supplemental oxygen as necessary.  Sedation given by anesthesia team.  Pacer pads placed anterior and posterior chest.  Cardioverted 1 time(s).  Cardioversion with synchronized biphasic 200J shock.  Evaluation: Findings: Post procedure EKG shows: NSR Complications: None Patient did tolerate procedure well.  Time Spent Directly with the Patient:  38 minutes   Darren Harrison Calender 12/23/2023, 9:13 AM

## 2023-12-23 NOTE — Anesthesia Postprocedure Evaluation (Signed)
 Anesthesia Post Note  Patient: Darren Harrison  Procedure(s) Performed: CARDIOVERSION     Patient location during evaluation: PACU Anesthesia Type: General Level of consciousness: awake Pain management: pain level controlled Vital Signs Assessment: post-procedure vital signs reviewed and stable Respiratory status: spontaneous breathing, nonlabored ventilation and respiratory function stable Cardiovascular status: blood pressure returned to baseline and stable Postop Assessment: no apparent nausea or vomiting Anesthetic complications: no   No notable events documented.  Last Vitals:  Vitals:   12/23/23 0945 12/23/23 0950  BP:  126/83  Pulse: (!) 58 (!) 56  Resp: 16 13  Temp:    SpO2: 95% 96%    Last Pain:  Vitals:   12/23/23 0905  TempSrc:   PainSc: 0-No pain                 [name]

## 2023-12-23 NOTE — Telephone Encounter (Signed)
 eliquis  5mg  #60/30ds is 122.17 due to coinsurance and xarelto  20mg  #30/30ds is 120.51 and dabigatran 150mg  #60/30ds is 61.35

## 2023-12-23 NOTE — Transfer of Care (Signed)
 Immediate Anesthesia Transfer of Care Note  Patient: Darren Harrison  Procedure(s) Performed: CARDIOVERSION  Patient Location: Cath Lab  Anesthesia Type:General  Level of Consciousness: awake, alert , oriented, and patient cooperative  Airway & Oxygen Therapy: Patient Spontanous Breathing and Patient connected to nasal cannula oxygen  Post-op Assessment: Report given to RN and Post -op Vital signs reviewed and stable  Post vital signs: Reviewed and stable  Last Vitals:  Vitals Value Taken Time  BP    Temp    Pulse 55 12/23/23 09:20  Resp 15 12/23/23 09:20  SpO2 97 % 12/23/23 09:20  Vitals shown include unfiled device data.  Last Pain:  Vitals:   12/23/23 0905  TempSrc:   PainSc: 0-No pain         Complications: No notable events documented.
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
## 2023-12-31 ENCOUNTER — Ambulatory Visit: Admitting: Urology

## 2023-12-31 VITALS — BP 142/84 | HR 73

## 2023-12-31 DIAGNOSIS — R338 Other retention of urine: Secondary | ICD-10-CM | POA: Diagnosis not present

## 2023-12-31 DIAGNOSIS — N138 Other obstructive and reflux uropathy: Secondary | ICD-10-CM

## 2023-12-31 DIAGNOSIS — R3129 Other microscopic hematuria: Secondary | ICD-10-CM

## 2023-12-31 DIAGNOSIS — N401 Enlarged prostate with lower urinary tract symptoms: Secondary | ICD-10-CM | POA: Diagnosis not present

## 2023-12-31 DIAGNOSIS — R339 Retention of urine, unspecified: Secondary | ICD-10-CM

## 2023-12-31 DIAGNOSIS — Z87448 Personal history of other diseases of urinary system: Secondary | ICD-10-CM

## 2023-12-31 DIAGNOSIS — R8271 Bacteriuria: Secondary | ICD-10-CM

## 2023-12-31 LAB — BLADDER SCAN AMB NON-IMAGING: Scan Result: 81

## 2023-12-31 MED ORDER — CEPHALEXIN 500 MG PO CAPS
500.0000 mg | ORAL_CAPSULE | Freq: Two times a day (BID) | ORAL | 0 refills | Status: AC
Start: 2023-12-31 — End: ?

## 2023-12-31 NOTE — Progress Notes (Signed)
 Bladder Scan completed today due to reason of incomplete bladder emptying   Patient can void prior to the bladder scan. Bladder scan result: 81  Performed By: Exie DASEN. CMA  Additional notes- Patient is scheduled to follow up with MD

## 2024-01-01 LAB — URINALYSIS, ROUTINE W REFLEX MICROSCOPIC
Bilirubin, UA: NEGATIVE
Glucose, UA: NEGATIVE
Ketones, UA: NEGATIVE
Nitrite, UA: POSITIVE — AB
Protein,UA: NEGATIVE
Specific Gravity, UA: <1.005 — ABNORMAL LOW (ref 1.005–1.030)
Urobilinogen, Ur: 0.2 mg/dL (ref 0.2–1.0)
pH, UA: 6 (ref 5.0–7.5)

## 2024-01-01 LAB — MICROSCOPIC EXAMINATION

## 2024-01-03 ENCOUNTER — Encounter (HOSPITAL_COMMUNITY): Payer: Self-pay | Admitting: Internal Medicine

## 2024-01-03 ENCOUNTER — Ambulatory Visit (HOSPITAL_COMMUNITY)
Admission: RE | Admit: 2024-01-03 | Discharge: 2024-01-03 | Disposition: A | Discharge status: Home / Self Care | Source: Ambulatory Visit | Attending: Internal Medicine | Admitting: Internal Medicine

## 2024-01-03 VITALS — BP 134/88 | HR 55 | Ht 70.0 in | Wt 267.8 lb

## 2024-01-03 DIAGNOSIS — I4819 Other persistent atrial fibrillation: Secondary | ICD-10-CM | POA: Diagnosis not present

## 2024-01-03 DIAGNOSIS — D6869 Other thrombophilia: Secondary | ICD-10-CM | POA: Diagnosis not present

## 2024-01-03 NOTE — Progress Notes (Signed)
 Primary Care Physician: Starla Stapler, FNP Primary Cardiologist: None Electrophysiologist: None     Referring Physician: ED     Darren Harrison is a 68 y.o. male with a history of HTN, cataract, Graves disease, and atrial fibrillation who presents for consultation in the [facility]. ED visit on 10/16 for Afib after being noted to be in Afib after cataract surgery earlier same day. Patient is on Eliquis  for stroke prevention.  On follow-up 01/03/2024, patient is currently in NSR.  S/p successful cardioversion on 12/23/2023.  No missed doses of Eliquis .  Today, he denies symptoms of palpitations, chest pain, shortness of breath, orthopnea, PND, lower extremity edema, dizziness, presyncope, syncope, bleeding, or neurologic sequela. The patient is tolerating medications without difficulties and is otherwise without complaint today.    he has a BMI of Body mass index is 38.43 kg/m.SABRA Filed Weights   01/03/24 1005  Weight: 121.5 kg     Current Outpatient Medications  Medication Sig Dispense Refill   acetaminophen  (TYLENOL ) 325 MG tablet Take 2 tablets (650 mg total) by mouth every 4 (four) hours as needed for mild pain ((score 1 to 3) or temp > 100.5).     Amino Acids (AMINO ACID PO) Take 1 capsule by mouth in the morning and at bedtime.     apixaban  (ELIQUIS ) 5 MG TABS tablet Take 1 tablet (5 mg total) by mouth 2 (two) times daily. 60 tablet 2   cephALEXin  (KEFLEX ) 500 MG capsule Take 1 capsule (500 mg total) by mouth 2 (two) times daily. 14 capsule 0   finasteride  (PROSCAR ) 5 MG tablet Take 5 mg by mouth daily.     levothyroxine  (SYNTHROID ) 175 MCG tablet Take 1 tablet (175 mcg total) by mouth daily before breakfast. 90 tablet 1   losartan  (COZAAR ) 50 MG tablet Take 50 mg by mouth 2 (two) times daily.     metoprolol  tartrate (LOPRESSOR ) 25 MG tablet Take 25 mg by mouth 2 (two) times daily.     Multiple Vitamins-Minerals (CENTRUM ADULTS PO) Take 1  tablet by mouth every evening.     pantoprazole  (PROTONIX ) 40 MG tablet Take 1 tablet (40 mg total) by mouth daily before breakfast. 90 tablet 3   tamsulosin  (FLOMAX ) 0.4 MG CAPS capsule TAKE 2 CAPSULES BY MOUTH DAILY AFTER SUPPER. (Patient taking differently: Take 0.8 mg by mouth daily.) 180 capsule 1   Zinc  50 MG TABS Take 50 mg by mouth daily.     No current facility-administered medications for this encounter.    Atrial Fibrillation Management history:  Previous antiarrhythmic drugs: none Previous cardioversions: 12/23/2023 Previous ablations: none Anticoagulation history: Eliquis    ROS- All systems are reviewed and negative except as per the HPI above.  Physical Exam: BP 134/88   Pulse (!) 55   Ht 5' 10 (1.778 m)   Wt 121.5 kg   BMI 38.43 kg/m   GEN- The patient is well appearing, alert and oriented x 3 today.   Neck - no JVD or carotid bruit noted Lungs- Clear to ausculation bilaterally, normal work of breathing Heart- Regular rate and rhythm, no murmurs, rubs or gallops, PMI not laterally displaced Extremities- no clubbing, cyanosis, or edema Skin - no rash or ecchymosis noted   EKG today demonstrates  EKG Interpretation Date/Time:  Friday January 03 2024 10:07:33 EST Ventricular Rate:  55 PR Interval:  230 QRS Duration:  100 QT Interval:  438 QTC Calculation: 419 R Axis:   -16  Text  Interpretation: Sinus bradycardia with 1st degree A-V block Otherwise normal ECG When compared with ECG of 23-Dec-2023 09:18, Premature atrial complexes are no longer Present Confirmed by Terra Pac ([id number]) on 01/03/2024 10:20:20 AM    Echo scheduled for 11/24.  ASSESSMENT & PLAN CHA2DS2-VASc Score = 2  The patient's score is based upon: CHF History: 0 HTN History: 1 Diabetes History: 0 Stroke History: 0 Vascular Disease History: 0 Age Score: 1 Gender Score: 0       ASSESSMENT AND PLAN: Persistent Atrial Fibrillation (ICD10:  I48.19) The patient's CHA2DS2-VASc  score is 2, indicating a 2.2% annual risk of stroke.   S/p DCCV on 12/23/2023  Patient is currently in NSR.  Continue Lopressor  25 mg twice a day.  Secondary Hypercoagulable State (ICD10:  D68.69) The patient is at significant risk for stroke/thromboembolism based upon his CHA2DS2-VASc Score of 2.  Continue Apixaban  (Eliquis ).  No missed doses of Eliquis .     Follow up 6 months Afib clinic.    Terra Pac, [facility]  Afib Clinic [address] [place], [place] [address] [phone]

## 2024-01-04 LAB — URINE CULTURE

## 2024-01-06 ENCOUNTER — Ambulatory Visit: Payer: Self-pay

## 2024-01-06 ENCOUNTER — Ambulatory Visit (HOSPITAL_COMMUNITY)
Admission: RE | Admit: 2024-01-06 | Discharge: 2024-01-06 | Disposition: A | Discharge status: Home / Self Care | Source: Ambulatory Visit | Attending: Internal Medicine | Admitting: Internal Medicine

## 2024-01-06 ENCOUNTER — Other Ambulatory Visit (HOSPITAL_COMMUNITY): Payer: Self-pay | Admitting: *Deleted

## 2024-01-06 ENCOUNTER — Ambulatory Visit (HOSPITAL_COMMUNITY): Payer: Self-pay | Admitting: Internal Medicine

## 2024-01-06 ENCOUNTER — Other Ambulatory Visit: Payer: Self-pay | Admitting: Urology

## 2024-01-06 DIAGNOSIS — R8271 Bacteriuria: Secondary | ICD-10-CM

## 2024-01-06 DIAGNOSIS — D6869 Other thrombophilia: Secondary | ICD-10-CM | POA: Insufficient documentation

## 2024-01-06 DIAGNOSIS — I480 Paroxysmal atrial fibrillation: Secondary | ICD-10-CM | POA: Diagnosis present

## 2024-01-06 DIAGNOSIS — I4819 Other persistent atrial fibrillation: Secondary | ICD-10-CM

## 2024-01-06 DIAGNOSIS — I4891 Unspecified atrial fibrillation: Secondary | ICD-10-CM

## 2024-01-06 LAB — ECHOCARDIOGRAM COMPLETE
Area-P 1/2: 2.88 cm2
Est EF: 45
S' Lateral: 4.3 cm
Single Plane A4C EF: 44.8 %

## 2024-01-06 MED ORDER — SULFAMETHOXAZOLE-TRIMETHOPRIM 800-160 MG PO TABS
1.0000 | ORAL_TABLET | Freq: Two times a day (BID) | ORAL | 0 refills | Status: AC
Start: 2024-01-06 — End: ?

## 2024-01-06 NOTE — Telephone Encounter (Signed)
-----   Message from Garnette HERO Dahlstedt sent at 01/06/2024  2:23 PM EST ----- Please call pt--urine culture pos, but bacteria was resistant to kefkex. I sent in sulfa  instead ----- Message ----- From: Interface, [facility] Lab Results In Sent: 01/01/2024   5:39 AM EST To: Garnette Shack, MD

## 2024-01-06 NOTE — Telephone Encounter (Signed)
Pt was made aware and voiced understanding

## 2024-01-08 ENCOUNTER — Other Ambulatory Visit: Payer: Self-pay | Admitting: *Deleted

## 2024-01-08 DIAGNOSIS — K7581 Nonalcoholic steatohepatitis (NASH): Secondary | ICD-10-CM

## 2024-01-13 ENCOUNTER — Telehealth: Payer: Self-pay

## 2024-01-13 NOTE — Telephone Encounter (Signed)
 Pt called to confirm MD Dahlstedt put in order for UA to be completed at [facility] in [place] pt made aware that orders are in

## 2024-01-15 ENCOUNTER — Other Ambulatory Visit: Payer: Self-pay

## 2024-01-15 ENCOUNTER — Telehealth: Payer: Self-pay

## 2024-01-15 DIAGNOSIS — R8271 Bacteriuria: Secondary | ICD-10-CM

## 2024-01-15 NOTE — Telephone Encounter (Signed)
 Pt called and lvm  stating his orders for [facility] in [place] were not in pt advised via vm that if he could call back with the needed information to have the orders faxed to we could get them faxed over today pt advised to c/b to give us  details

## 2024-01-15 NOTE — Telephone Encounter (Signed)
 Called the [facility] in [place] on [address] received their fax number and faxed pt order to their location did not get answer found fax number online [phone]) faxed pt order per pt request

## 2024-01-16 ENCOUNTER — Encounter: Payer: Self-pay | Admitting: Gastroenterology

## 2024-01-17 LAB — MICROSCOPIC EXAMINATION
Bacteria, UA: NONE SEEN
Casts: NONE SEEN /LPF
Epithelial Cells (non renal): NONE SEEN /HPF (ref 0–10)

## 2024-01-17 LAB — URINALYSIS, ROUTINE W REFLEX MICROSCOPIC
Bilirubin, UA: NEGATIVE
Glucose, UA: NEGATIVE
Ketones, UA: NEGATIVE
Nitrite, UA: NEGATIVE
Protein,UA: NEGATIVE
RBC, UA: NEGATIVE
Specific Gravity, UA: 1.015 (ref 1.005–1.030)
Urobilinogen, Ur: 1 mg/dL (ref 0.2–1.0)
pH, UA: 7 (ref 5.0–7.5)

## 2024-01-24 LAB — COMPREHENSIVE METABOLIC PANEL WITH GFR
ALT: 23 IU/L (ref 0–44)
AST: 23 IU/L (ref 0–40)
Albumin: 4.5 g/dL (ref 3.9–4.9)
Alkaline Phosphatase: 103 IU/L (ref 47–123)
BUN/Creatinine Ratio: 21 (ref 10–24)
BUN: 17 mg/dL (ref 8–27)
Bilirubin Total: 0.5 mg/dL (ref 0.0–1.2)
CO2: 27 mmol/L (ref 20–29)
Calcium: 9.2 mg/dL (ref 8.6–10.2)
Chloride: 100 mmol/L (ref 96–106)
Creatinine, Ser: 0.82 mg/dL (ref 0.76–1.27)
Globulin, Total: 2.1 g/dL (ref 1.5–4.5)
Glucose: 95 mg/dL (ref 70–99)
Potassium: 5.1 mmol/L (ref 3.5–5.2)
Sodium: 139 mmol/L (ref 134–144)
Total Protein: 6.6 g/dL (ref 6.0–8.5)
eGFR: 96 mL/min/1.73 (ref >59)

## 2024-01-24 LAB — CBC
Hematocrit: 43.5 % (ref 37.5–51.0)
Hemoglobin: 14.3 g/dL (ref 13.0–17.7)
MCH: 30.6 pg (ref 26.6–33.0)
MCHC: 32.9 g/dL (ref 31.5–35.7)
MCV: 93 fL (ref 79–97)
Platelets: 148 x10E3/uL — ABNORMAL LOW (ref 150–450)
RBC: 4.68 x10E6/uL (ref 4.14–5.80)
RDW: 12 % (ref 11.6–15.4)
WBC: 4.4 x10E3/uL (ref 3.4–10.8)

## 2024-01-24 LAB — PROTIME-INR
INR: 1.1 (ref 0.9–1.2)
Prothrombin Time: 12.1 s — ABNORMAL HIGH (ref 9.1–12.0)

## 2024-01-26 ENCOUNTER — Ambulatory Visit: Payer: Self-pay | Admitting: Gastroenterology

## 2024-01-27 ENCOUNTER — Encounter: Payer: Self-pay | Admitting: *Deleted

## 2024-02-03 ENCOUNTER — Ambulatory Visit (HOSPITAL_COMMUNITY)

## 2024-02-04 NOTE — Telephone Encounter (Signed)
 Pt read MyChart message. Nic for follow up labs

## 2024-02-19 ENCOUNTER — Ambulatory Visit (HOSPITAL_COMMUNITY)
Admission: RE | Admit: 2024-02-19 | Discharge: 2024-02-19 | Disposition: A | Discharge status: Home / Self Care | Source: Ambulatory Visit | Attending: Gastroenterology | Admitting: Gastroenterology

## 2024-02-19 DIAGNOSIS — K7469 Other cirrhosis of liver: Secondary | ICD-10-CM | POA: Insufficient documentation

## 2024-02-19 DIAGNOSIS — K7581 Nonalcoholic steatohepatitis (NASH): Secondary | ICD-10-CM | POA: Diagnosis present

## 2024-02-27 NOTE — Progress Notes (Signed)
 NIC'd the US  for 6 months

## 2024-03-15 ENCOUNTER — Other Ambulatory Visit: Payer: Self-pay | Admitting: "Endocrinology

## 2024-03-15 DIAGNOSIS — E890 Postprocedural hypothyroidism: Secondary | ICD-10-CM

## 2024-03-16 ENCOUNTER — Ambulatory Visit: Admitting: Cardiology

## 2024-03-20 ENCOUNTER — Ambulatory Visit: Admitting: Cardiology

## 2024-03-20 ENCOUNTER — Encounter: Payer: Self-pay | Admitting: Cardiology

## 2024-03-20 VITALS — BP 104/70 | HR 70 | Ht 70.0 in | Wt 269.6 lb

## 2024-03-20 DIAGNOSIS — I5022 Chronic systolic (congestive) heart failure: Secondary | ICD-10-CM

## 2024-03-20 DIAGNOSIS — I480 Paroxysmal atrial fibrillation: Secondary | ICD-10-CM

## 2024-03-20 DIAGNOSIS — D6869 Other thrombophilia: Secondary | ICD-10-CM

## 2024-03-20 NOTE — Patient Instructions (Signed)
 Medication Instructions:   Continue all current medications.   Labwork:  none  Testing/Procedures:  Your physician has requested that you have a limited echocardiogram. Echocardiography is a painless test that uses sound waves to create images of your heart. It provides your doctor with information about the size and shape of your heart and how well your hearts chambers and valves are working. This procedure takes approximately one hour. There are no restrictions for this procedure. Please do NOT wear cologne, perfume, aftershave, or lotions (deodorant is allowed). Please arrive 15 minutes prior to your appointment time.  Please note: We ask at that you not bring children with you during ultrasound (echo/ vascular) testing. Due to room size and safety concerns, children are not allowed in the ultrasound rooms during exams. Our front office staff cannot provide observation of children in our lobby area while testing is being conducted. An adult accompanying a patient to their appointment will only be allowed in the ultrasound room at the discretion of the ultrasound technician under special circumstances. We apologize for any inconvenience. Office will contact with results via phone, letter or mychart.     Follow-Up:  Pending   Any Other Special Instructions Will Be Listed Below (If Applicable).   If you need a refill on your cardiac medications before your next appointment, please call your pharmacy.
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
## 2024-03-26 ENCOUNTER — Ambulatory Visit: Admitting: "Endocrinology

## 2024-04-09 ENCOUNTER — Ambulatory Visit

## 2024-07-02 ENCOUNTER — Ambulatory Visit (HOSPITAL_COMMUNITY): Admitting: Internal Medicine
